# Patient Record
Sex: Female | Born: 1960
Health system: Southern US, Community
[De-identification: ages and names within clinical notes are randomized; demographics above are authoritative.]

## PROBLEM LIST (undated history)

## (undated) DIAGNOSIS — R55 Syncope and collapse: Secondary | ICD-10-CM

## (undated) DIAGNOSIS — D649 Anemia, unspecified: Secondary | ICD-10-CM

## (undated) DIAGNOSIS — F32A Depression, unspecified: Secondary | ICD-10-CM

## (undated) DIAGNOSIS — E785 Hyperlipidemia, unspecified: Secondary | ICD-10-CM

## (undated) DIAGNOSIS — F419 Anxiety disorder, unspecified: Secondary | ICD-10-CM

## (undated) DIAGNOSIS — I10 Essential (primary) hypertension: Secondary | ICD-10-CM

## (undated) DIAGNOSIS — E119 Type 2 diabetes mellitus without complications: Secondary | ICD-10-CM

## (undated) DIAGNOSIS — F319 Bipolar disorder, unspecified: Secondary | ICD-10-CM

## (undated) DIAGNOSIS — N189 Chronic kidney disease, unspecified: Secondary | ICD-10-CM

## (undated) DIAGNOSIS — K5792 Diverticulitis of intestine, part unspecified, without perforation or abscess without bleeding: Secondary | ICD-10-CM

## (undated) DIAGNOSIS — F329 Major depressive disorder, single episode, unspecified: Secondary | ICD-10-CM

## (undated) DIAGNOSIS — R739 Hyperglycemia, unspecified: Secondary | ICD-10-CM

## (undated) DIAGNOSIS — I214 Non-ST elevation (NSTEMI) myocardial infarction: Secondary | ICD-10-CM

## (undated) HISTORY — DX: Hyperglycemia, unspecified: R73.9

## (undated) HISTORY — DX: Depression, unspecified: F32.A

## (undated) HISTORY — DX: Major depressive disorder, single episode, unspecified: F32.9

## (undated) HISTORY — DX: Hyperlipidemia, unspecified: E78.5

## (undated) HISTORY — DX: Essential (primary) hypertension: I10

## (undated) HISTORY — DX: Type 2 diabetes mellitus without complications: E11.9

## (undated) HISTORY — DX: Chronic kidney disease, unspecified: N18.9

## (undated) HISTORY — DX: Anemia, unspecified: D64.9

## (undated) HISTORY — DX: Anxiety disorder, unspecified: F41.9

---
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
## 1981-02-12 HISTORY — PX: KNEE SURGERY: SHX244

## 1983-02-13 HISTORY — PX: CHOLECYSTECTOMY: SHX55

## 1983-02-13 HISTORY — PX: APPENDECTOMY: SHX54

## 1983-02-13 HISTORY — PX: TUBAL LIGATION: SHX77

## 1999-01-19 ENCOUNTER — Encounter: Payer: Self-pay | Admitting: Gastroenterology

## 1999-01-19 ENCOUNTER — Encounter: Admission: RE | Admit: 1999-01-19 | Discharge: 1999-01-19 | Payer: Self-pay | Admitting: Gastroenterology

## 1999-10-12 ENCOUNTER — Encounter: Payer: Self-pay | Admitting: *Deleted

## 1999-10-12 ENCOUNTER — Emergency Department (HOSPITAL_COMMUNITY): Admission: EM | Admit: 1999-10-12 | Discharge: 1999-10-12 | Payer: Self-pay | Admitting: Podiatry

## 2000-02-07 ENCOUNTER — Encounter: Admission: RE | Admit: 2000-02-07 | Discharge: 2000-05-07 | Payer: Self-pay | Admitting: Orthopaedic Surgery

## 2000-02-13 HISTORY — PX: HEMORRHOID SURGERY: SHX153

## 2000-06-20 ENCOUNTER — Encounter: Payer: Self-pay | Admitting: Internal Medicine

## 2000-06-20 ENCOUNTER — Encounter: Admission: RE | Admit: 2000-06-20 | Discharge: 2000-06-20 | Payer: Self-pay | Admitting: Internal Medicine

## 2000-09-24 ENCOUNTER — Other Ambulatory Visit: Admission: RE | Admit: 2000-09-24 | Discharge: 2000-09-24 | Payer: Self-pay | Admitting: Obstetrics and Gynecology

## 2000-12-26 ENCOUNTER — Encounter: Payer: Self-pay | Admitting: General Surgery

## 2000-12-26 ENCOUNTER — Ambulatory Visit (HOSPITAL_COMMUNITY): Admission: RE | Admit: 2000-12-26 | Discharge: 2000-12-26 | Payer: Self-pay | Admitting: General Surgery

## 2000-12-26 ENCOUNTER — Encounter (INDEPENDENT_AMBULATORY_CARE_PROVIDER_SITE_OTHER): Payer: Self-pay | Admitting: Specialist

## 2001-01-20 ENCOUNTER — Encounter (INDEPENDENT_AMBULATORY_CARE_PROVIDER_SITE_OTHER): Payer: Self-pay | Admitting: Specialist

## 2001-01-20 ENCOUNTER — Ambulatory Visit (HOSPITAL_COMMUNITY): Admission: RE | Admit: 2001-01-20 | Discharge: 2001-01-20 | Payer: Self-pay

## 2001-02-19 ENCOUNTER — Emergency Department (HOSPITAL_COMMUNITY): Admission: EM | Admit: 2001-02-19 | Discharge: 2001-02-19 | Payer: Self-pay | Admitting: Emergency Medicine

## 2001-02-19 ENCOUNTER — Encounter: Payer: Self-pay | Admitting: Emergency Medicine

## 2002-02-12 HISTORY — PX: TEAR DUCT PROBING: SHX793

## 2002-10-21 ENCOUNTER — Encounter: Payer: Self-pay | Admitting: Cardiology

## 2003-02-13 HISTORY — PX: DILATION AND CURETTAGE OF UTERUS: SHX78

## 2003-03-30 ENCOUNTER — Other Ambulatory Visit: Admission: RE | Admit: 2003-03-30 | Discharge: 2003-03-30 | Payer: Self-pay | Admitting: Obstetrics and Gynecology

## 2003-04-15 ENCOUNTER — Encounter: Admission: RE | Admit: 2003-04-15 | Discharge: 2003-04-15 | Payer: Self-pay | Admitting: Obstetrics and Gynecology

## 2003-05-28 ENCOUNTER — Encounter: Admission: RE | Admit: 2003-05-28 | Discharge: 2003-05-28 | Payer: Self-pay | Admitting: Obstetrics and Gynecology

## 2003-06-10 ENCOUNTER — Encounter: Admission: RE | Admit: 2003-06-10 | Discharge: 2003-06-10 | Payer: Self-pay | Admitting: Obstetrics and Gynecology

## 2003-09-06 ENCOUNTER — Encounter: Admission: RE | Admit: 2003-09-06 | Discharge: 2003-09-06 | Payer: Self-pay | Admitting: Obstetrics and Gynecology

## 2004-02-13 HISTORY — PX: POLYPECTOMY: SHX149

## 2004-04-19 ENCOUNTER — Emergency Department (HOSPITAL_COMMUNITY): Admission: EM | Admit: 2004-04-19 | Discharge: 2004-04-19 | Payer: Self-pay | Admitting: Family Medicine

## 2005-03-05 ENCOUNTER — Emergency Department (HOSPITAL_COMMUNITY): Admission: EM | Admit: 2005-03-05 | Discharge: 2005-03-05 | Payer: Self-pay | Admitting: Family Medicine

## 2005-03-29 ENCOUNTER — Encounter: Admission: RE | Admit: 2005-03-29 | Discharge: 2005-03-29 | Payer: Self-pay | Admitting: Orthopedic Surgery

## 2005-04-03 ENCOUNTER — Other Ambulatory Visit: Admission: RE | Admit: 2005-04-03 | Discharge: 2005-04-03 | Payer: Self-pay | Admitting: Obstetrics and Gynecology

## 2005-04-05 ENCOUNTER — Encounter: Admission: RE | Admit: 2005-04-05 | Discharge: 2005-04-05 | Payer: Self-pay | Admitting: Obstetrics and Gynecology

## 2006-05-28 ENCOUNTER — Encounter: Payer: Self-pay | Admitting: Cardiology

## 2006-08-03 ENCOUNTER — Emergency Department (HOSPITAL_COMMUNITY): Admission: EM | Admit: 2006-08-03 | Discharge: 2006-08-03 | Payer: Self-pay | Admitting: *Deleted

## 2006-08-27 ENCOUNTER — Ambulatory Visit (HOSPITAL_COMMUNITY): Admission: RE | Admit: 2006-08-27 | Discharge: 2006-08-27 | Payer: Self-pay | Admitting: Orthopaedic Surgery

## 2006-09-03 ENCOUNTER — Encounter: Admission: RE | Admit: 2006-09-03 | Discharge: 2006-12-02 | Payer: Self-pay | Admitting: Orthopaedic Surgery

## 2007-12-19 ENCOUNTER — Encounter: Payer: Self-pay | Admitting: Cardiology

## 2008-02-13 LAB — HM DEXA SCAN: HM Dexa Scan: NORMAL

## 2008-05-28 ENCOUNTER — Encounter: Payer: Self-pay | Admitting: Cardiology

## 2008-06-25 ENCOUNTER — Encounter: Admission: RE | Admit: 2008-06-25 | Discharge: 2008-06-25 | Payer: Self-pay | Admitting: Obstetrics and Gynecology

## 2008-07-10 ENCOUNTER — Emergency Department (HOSPITAL_COMMUNITY): Admission: EM | Admit: 2008-07-10 | Discharge: 2008-07-10 | Payer: Self-pay | Admitting: Family Medicine

## 2008-07-31 ENCOUNTER — Encounter: Payer: Self-pay | Admitting: Cardiology

## 2008-07-31 ENCOUNTER — Emergency Department (HOSPITAL_COMMUNITY): Admission: EM | Admit: 2008-07-31 | Discharge: 2008-07-31 | Payer: Self-pay | Admitting: Emergency Medicine

## 2008-08-19 DIAGNOSIS — F329 Major depressive disorder, single episode, unspecified: Secondary | ICD-10-CM

## 2008-08-19 DIAGNOSIS — F419 Anxiety disorder, unspecified: Secondary | ICD-10-CM

## 2008-08-19 DIAGNOSIS — F32A Depression, unspecified: Secondary | ICD-10-CM | POA: Insufficient documentation

## 2008-08-19 DIAGNOSIS — I10 Essential (primary) hypertension: Secondary | ICD-10-CM | POA: Insufficient documentation

## 2008-08-19 DIAGNOSIS — K589 Irritable bowel syndrome without diarrhea: Secondary | ICD-10-CM | POA: Insufficient documentation

## 2008-08-20 ENCOUNTER — Ambulatory Visit: Payer: Self-pay | Admitting: Cardiology

## 2008-08-20 DIAGNOSIS — R0609 Other forms of dyspnea: Secondary | ICD-10-CM | POA: Insufficient documentation

## 2008-08-20 DIAGNOSIS — R002 Palpitations: Secondary | ICD-10-CM | POA: Insufficient documentation

## 2008-08-20 DIAGNOSIS — R0600 Dyspnea, unspecified: Secondary | ICD-10-CM | POA: Insufficient documentation

## 2008-08-20 DIAGNOSIS — R0602 Shortness of breath: Secondary | ICD-10-CM

## 2008-08-20 LAB — CONVERTED CEMR LAB: Pro B Natriuretic peptide (BNP): 48 pg/mL (ref 0.0–100.0)

## 2008-08-26 ENCOUNTER — Encounter: Payer: Self-pay | Admitting: Cardiology

## 2008-08-29 LAB — CONVERTED CEMR LAB: TSH: 0.978 microintl units/mL (ref 0.350–4.500)

## 2008-10-11 ENCOUNTER — Ambulatory Visit: Payer: Self-pay

## 2008-10-11 ENCOUNTER — Ambulatory Visit: Payer: Self-pay | Admitting: Cardiology

## 2008-10-21 ENCOUNTER — Ambulatory Visit (HOSPITAL_COMMUNITY): Admission: RE | Admit: 2008-10-21 | Discharge: 2008-10-21 | Payer: Self-pay | Admitting: Cardiology

## 2008-10-26 ENCOUNTER — Encounter: Payer: Self-pay | Admitting: Cardiology

## 2008-10-26 ENCOUNTER — Ambulatory Visit (HOSPITAL_COMMUNITY): Admission: RE | Admit: 2008-10-26 | Discharge: 2008-10-26 | Payer: Self-pay | Admitting: Cardiology

## 2008-10-26 ENCOUNTER — Ambulatory Visit: Payer: Self-pay | Admitting: Internal Medicine

## 2008-11-01 ENCOUNTER — Telehealth: Payer: Self-pay | Admitting: Cardiology

## 2008-12-07 ENCOUNTER — Ambulatory Visit: Payer: Self-pay | Admitting: Cardiology

## 2008-12-07 DIAGNOSIS — Z720 Tobacco use: Secondary | ICD-10-CM | POA: Insufficient documentation

## 2008-12-07 DIAGNOSIS — F17200 Nicotine dependence, unspecified, uncomplicated: Secondary | ICD-10-CM | POA: Insufficient documentation

## 2008-12-13 LAB — HM PAP SMEAR: HM Pap smear: NORMAL

## 2008-12-13 LAB — HM MAMMOGRAPHY: HM Mammogram: NORMAL

## 2010-02-17 ENCOUNTER — Emergency Department (HOSPITAL_COMMUNITY)
Admission: EM | Admit: 2010-02-17 | Discharge: 2010-02-17 | Payer: Self-pay | Source: Home / Self Care | Admitting: Emergency Medicine

## 2010-02-17 LAB — POCT CARDIAC MARKERS
CKMB, poc: 1.4 ng/mL (ref 1.0–8.0)
Myoglobin, poc: 105 ng/mL (ref 12–200)
Troponin i, poc: <0.05 ng/mL (ref 0.00–0.09)

## 2010-02-17 LAB — CBC
HCT: 42.6 % (ref 36.0–46.0)
Hemoglobin: 14.5 g/dL (ref 12.0–15.0)
MCH: 31.6 pg (ref 26.0–34.0)
MCHC: 34 g/dL (ref 30.0–36.0)
MCV: 92.8 fL (ref 78.0–100.0)
Platelets: 298 10*3/uL (ref 150–400)
RBC: 4.59 MIL/uL (ref 3.87–5.11)
RDW: 13.8 % (ref 11.5–15.5)
WBC: 9 10*3/uL (ref 4.0–10.5)

## 2010-02-17 LAB — BASIC METABOLIC PANEL
BUN: 8 mg/dL (ref 6–23)
CO2: 27 mEq/L (ref 19–32)
Calcium: 10 mg/dL (ref 8.4–10.5)
Chloride: 104 mEq/L (ref 96–112)
Creatinine, Ser: 1.02 mg/dL (ref 0.4–1.2)
GFR calc Af Amer: >60 mL/min (ref >60)
GFR calc non Af Amer: 57 mL/min — ABNORMAL LOW (ref >60)
Glucose, Bld: 93 mg/dL (ref 70–99)
Potassium: 3.4 mEq/L — ABNORMAL LOW (ref 3.5–5.1)
Sodium: 141 mEq/L (ref 135–145)

## 2010-02-17 LAB — DIFFERENTIAL
Basophils Absolute: 0 10*3/uL (ref 0.0–0.1)
Basophils Relative: 0 % (ref 0–1)
Eosinophils Absolute: 0.1 10*3/uL (ref 0.0–0.7)
Eosinophils Relative: 1 % (ref 0–5)
Lymphocytes Relative: 44 % (ref 12–46)
Lymphs Abs: 4 10*3/uL (ref 0.7–4.0)
Monocytes Absolute: 0.7 10*3/uL (ref 0.1–1.0)
Monocytes Relative: 8 % (ref 3–12)
Neutro Abs: 4.2 10*3/uL (ref 1.7–7.7)
Neutrophils Relative %: 47 % (ref 43–77)

## 2010-02-19 ENCOUNTER — Emergency Department (HOSPITAL_COMMUNITY)
Admission: EM | Admit: 2010-02-19 | Discharge: 2010-02-19 | Payer: Self-pay | Source: Home / Self Care | Admitting: Emergency Medicine

## 2010-02-27 LAB — POCT I-STAT, CHEM 8
BUN: 9 mg/dL (ref 6–23)
Calcium, Ion: 1.21 mmol/L (ref 1.12–1.32)
Chloride: 103 mEq/L (ref 96–112)
Creatinine, Ser: 1 mg/dL (ref 0.4–1.2)
Glucose, Bld: 106 mg/dL — ABNORMAL HIGH (ref 70–99)
HCT: 41 % (ref 36.0–46.0)
Hemoglobin: 13.9 g/dL (ref 12.0–15.0)
Potassium: 3.4 mEq/L — ABNORMAL LOW (ref 3.5–5.1)
Sodium: 141 mEq/L (ref 135–145)
TCO2: 29 mmol/L (ref 0–100)

## 2010-02-27 LAB — CSF CELL COUNT WITH DIFFERENTIAL
RBC Count, CSF: 0 /mm3
RBC Count, CSF: 0 /mm3
Tube #: 1
Tube #: 4
WBC, CSF: 0 /mm3 (ref 0–5)
WBC, CSF: 0 /mm3 (ref 0–5)

## 2010-02-27 LAB — PROTEIN AND GLUCOSE, CSF
Glucose, CSF: 65 mg/dL (ref 43–76)
Total  Protein, CSF: 32 mg/dL (ref 15–45)

## 2010-02-27 LAB — CSF CULTURE W GRAM STAIN
Culture: NO GROWTH
Gram Stain: NONE SEEN

## 2010-05-22 LAB — POCT CARDIAC MARKERS: Myoglobin, poc: 75.4 ng/mL (ref 12–200)

## 2010-05-22 LAB — URINALYSIS, ROUTINE W REFLEX MICROSCOPIC
Glucose, UA: NEGATIVE mg/dL
Hgb urine dipstick: NEGATIVE
Ketones, ur: NEGATIVE mg/dL
pH: 6 (ref 5.0–8.0)

## 2010-05-22 LAB — D-DIMER, QUANTITATIVE: D-Dimer, Quant: 0.35 ug/mL-FEU (ref 0.00–0.48)

## 2010-05-22 LAB — POCT I-STAT, CHEM 8
Creatinine, Ser: 0.9 mg/dL (ref 0.4–1.2)
HCT: 35 % — ABNORMAL LOW (ref 36.0–46.0)
Hemoglobin: 11.9 g/dL — ABNORMAL LOW (ref 12.0–15.0)
Potassium: 3.3 mEq/L — ABNORMAL LOW (ref 3.5–5.1)
Sodium: 139 mEq/L (ref 135–145)
TCO2: 27 mmol/L (ref 0–100)

## 2010-06-30 NOTE — Op Note (Signed)
[facility]  Patient:    Kaitlyn Harper, Kaitlyn Harper Visit Number: [id number] MRN: [id number]          Service Type: DSU Location: DAY Attending Physician:  Carson Myrtle Dictated by:   Sheppard Plumber Earlene Plater, M.D. Proc. Date: 12/26/00 Admit Date:  12/26/2000                             Operative Report  OUTPATIENT  PREOPERATIVE DIAGNOSIS:  Internal and external hemorrhoids.  Rule out ______ abscess.  POSTOPERATIVE DIAGNOSIS: Internal and external hemorrhoids.  Rule out ______ abscess.  OPERATION/PROCEDURE:  Examination under anesthesia.  Removal of hemorrhoids.  ANESTHESIA:  General  SURGEON: Timothy E. Earlene Plater, M.D.  HISTORY:  This patient is 30 and otherwise healthy, has been followed 3 weeks in the office beginning with intense anorectal pain, questionable drainage. She was seen in follow up conservatively, although she is better, it is not significantly healed, and she presents now for examination under anesthesia, removal of hemorrhoid and possible drainage of abscess.  DESCRIPTION OF PROCEDURE:  The patient was taken to the operating room, placed supine and general mask anesthesia provided, placed in the lithotomy position. Perianal area inspected with magnification.  A significant large, right anterior tag was present.  This was connected to a small, but significant internal hemorrhoid.  I carefully examined the entire rectal mucosa, anoderm and perianal skin, felt on palpation and visualization I could not see any evidence of abscess, fistula, or fissure.  The sphincters were intact.  The orifice was adequate.  I simply removed the single hemorrhoid in the right anterior position.  It was closed with a running 3-0 chromic.  There were no bleeding or complications.  The sphincter is intact.  She tolerated well.  She was removed to recovery room in good condition.  Written and verbal instructions were given including Percocet  for pain. Dictated by:   Sheppard Plumber Earlene Plater, M.D. Attending Physician:  Carson Myrtle DD:  12/26/00 TD:  12/26/00 Job: [id number]

## 2010-06-30 NOTE — H&P (Signed)
[facility]  Patient:    Kaitlyn Harper, Kaitlyn Harper Visit Number: [id number] MRN: [id number]          Service Type: Attending:  Guy Sandifer. Arleta Creek, M.D. Dictated by:   Guy Sandifer Arleta Creek, M.D. Adm. Date:  01/20/01                           History and Physical  CHIEF COMPLAINT:              Menometrorrhagia.  HISTORY OF PRESENT ILLNESS:   This patient is a 50 year old divorced black female, G3, P3, status post tubal ligation, whose cycles have been very predictable.  However, over the past year, she has a menstrual cycle every 18 to 20 days.  She has one day of heavy bleeding, changing a pad every two hours.  On examination, uterus was upper limits of normal size.  On ultrasound, the uterus measures 11.0 x 6.2 x 5.1 cm.  There are at least two leiomyomata noted measuring 1.3 and 1.5 cm, respectively, in the right lateral wall of the uterus.  The ovaries are normal.  Sonohysterogram reveals a 7 x 7-mm area of thickening on the posterior wall consistent with a possible submucous myoma.  Hysteroscopy with D&C and resectoscope has been discussed with the patient.  She is being admitted for this procedure.  PAST MEDICAL HISTORY:         1. Recent perirectal abscess and hemorrhoids                                  treated by Dr. Earlene Plater.                               2. Chronic hypertension.                               3. Irritable bowel syndrome.                               4. Depression in 1995.  PAST SURGICAL HISTORY:        1. Hemorrhoidectomy, November of this year,                                  Dr. Earlene Plater.                               2. Cholecystectomy and appendectomy.                               3. Spur removed from right patella.                               4. Postpartum tubal ligation.  OBSTETRICAL HISTORY:          Vaginal deliveries x 3.  FAMILY HISTORY:               Chronic hypertension and CVA in maternal grandmother, chronic  hypertension in father, hemophilia in first cousin,  scoliosis in daughter.  SOCIAL HISTORY:               Patient denies tobacco, alcohol or drug abuse.  MEDICATIONS:                  1. Benicar 40 mg daily.                               2. Prozac 20 mg daily.                               3. Hydrochlorothiazide 25 mg daily.                               4. Stool softener daily.                               5. Mircette daily.  ALLERGIES:                    SULFA leading to a rash.  REVIEW OF SYSTEMS:            Negative except as above.  PHYSICAL EXAMINATION:  VITAL SIGNS:                  Height 5 feet 3 inches.  Weight 155 pounds. Blood pressure 106/66.  HEENT:                        Without thyromegaly.  LUNGS:                        Clear to auscultation.  HEART:                        Regular rate and rhythm.  BACK:                         Without CVA tenderness.  BREASTS:                      Without masses, retraction or discharge.  ABDOMEN:                      Soft and nontender without masses.  PELVIC:                       Vulva, vagina and cervix without lesion.  Uterus anteverted, upper limits of normal size, nontender.  Adnexa nontender without masses.  EXTREMITIES:                  Grossly within normal limits.  NEUROLOGIC:                   Grossly within normal limits.  ASSESSMENT:                   Menometrorrhagia.  PLAN:                         Hysteroscopy, D&C and resectoscope. Dictated by:   Guy Sandifer Arleta Creek, M.D. Attending:  Guy Sandifer Arleta Creek, M.D. DD:  01/17/01 TD:  01/17/01 Job: [id number]

## 2010-06-30 NOTE — Op Note (Signed)
[facility]  Patient:    Kaitlyn Harper, Kaitlyn Harper Visit Number: [id number] MRN: [id number]          Service Type: DSU Location: [facility] Attending Physician:  Soledad Gerlach Dictated by:   Guy Sandifer Arleta Creek, M.D. Proc. Date: 01/20/01 Admit Date:  01/20/2001                             Operative Report  PREOPERATIVE DIAGNOSIS:       Menometrorrhagia.  POSTOPERATIVE DIAGNOSIS:      Menometrorrhagia.  OPERATION/PROCEDURE:          Hysteroscopy with resectoscope dilatation and curettage.  SURGEON:                      Guy Sandifer. Arleta Creek, M.D.  ANESTHESIA:                   General with LMA, 1% inferior cervical block.  ESTIMATED BLOOD LOSS:         Less than 50 cc.  INTAKE & OUTPUT:               Sorbitol distending media, 140 cc deficit.  INDICATIONS/CONSENT:          This patient is a 50 year old black female, with menometrorrhagia.  Details have been dictated in the History and Physical. Hysteroscopy with resectoscope, D&C were discussed with the patient. Potential risks and complications have been discussed including, but not limited to, infection, bowel/bladder or ureteral damage, bleeding requiring transfusion of blood products with possible transfusion reaction, HIV and hepatitis acquisition, DVT, PE, pneumonia, hysterectomy, and laparotomy.  All questions have been answered and consent is signed and on the chart.  FINDINGS:                     Initially the endometrial canal is uniform without abnormal structures of vessels.  Following D&C reinspection with the hysteroscope reveals an area on the mid posterior uterine wall that appears to be elevated, possibly consistent with a small submucous leiomyoma.  Fallopian tube ostia are identified bilaterally.  DESCRIPTION OF PROCEDURE:     The patient was taken to the operating room and placed in the dorsal supine position.  General anesthesia via LMA was induced. She was then placed in  the dorsal lithotomy position and gently prepped.  The bladder was straight catheterized and she was draped in sterile fashion.  A bivalve speculum was placed in the vagina and the anterior cervical lip injected with 1% xylocaine and grasped with a single-tooth tenaculum.  A paracervical block was then placed at the two, four, five, seven, eight, and ten oclock positions with 1% xylocaine.  The cervix was then gently progressively dilated to a 33 Pratt dilator.  The resectoscope was then placed in the cervix containing the single right angle wire loop and advanced under direct visualization using Sorbitol distending media.  The above findings were noted.  The hysteroscope was withdrawn and sharp curettage was carried out. The hysteroscope was then reintroduced and the above findings again noted. This area in the mid posterior uterine wall was resected in simple fashion. At the end of thee resection good hemostasis was noted and the cavity was clean and uniform.  All instruments counts were correct.  The patient was awakened and taken to the recovery room in stable condition. Dictated by:   Guy Sandifer Arleta Creek, M.D. Attending  Physician:  Soledad Gerlach DD:  01/20/01 TD:  01/20/01 Job: [id number]

## 2010-11-29 LAB — I-STAT 8, (EC8 V) (CONVERTED LAB)
BUN: 6
Bicarbonate: 25.6 — ABNORMAL HIGH
Glucose, Bld: 92
HCT: 37
Operator id: 257131
TCO2: 27
pCO2, Ven: 37 — ABNORMAL LOW

## 2010-11-29 LAB — D-DIMER, QUANTITATIVE: D-Dimer, Quant: 0.27

## 2010-11-29 LAB — POCT I-STAT CREATININE: Operator id: 257131

## 2010-11-29 LAB — POCT CARDIAC MARKERS
CKMB, poc: <1 — ABNORMAL LOW
Troponin i, poc: <0.05

## 2010-12-26 ENCOUNTER — Ambulatory Visit (HOSPITAL_BASED_OUTPATIENT_CLINIC_OR_DEPARTMENT_OTHER)
Admission: RE | Admit: 2010-12-26 | Discharge: 2010-12-26 | Disposition: A | Discharge status: Home / Self Care | Payer: 59 | Source: Ambulatory Visit | Attending: Family | Admitting: Family

## 2010-12-26 ENCOUNTER — Ambulatory Visit (INDEPENDENT_AMBULATORY_CARE_PROVIDER_SITE_OTHER): Payer: 59 | Admitting: Family

## 2010-12-26 ENCOUNTER — Other Ambulatory Visit: Payer: Self-pay | Admitting: Family

## 2010-12-26 ENCOUNTER — Encounter: Payer: Self-pay | Admitting: Family

## 2010-12-26 DIAGNOSIS — E559 Vitamin D deficiency, unspecified: Secondary | ICD-10-CM | POA: Insufficient documentation

## 2010-12-26 DIAGNOSIS — F341 Dysthymic disorder: Secondary | ICD-10-CM

## 2010-12-26 DIAGNOSIS — Z1231 Encounter for screening mammogram for malignant neoplasm of breast: Secondary | ICD-10-CM

## 2010-12-26 DIAGNOSIS — R7309 Other abnormal glucose: Secondary | ICD-10-CM

## 2010-12-26 DIAGNOSIS — E1122 Type 2 diabetes mellitus with diabetic chronic kidney disease: Secondary | ICD-10-CM | POA: Insufficient documentation

## 2010-12-26 DIAGNOSIS — R635 Abnormal weight gain: Secondary | ICD-10-CM

## 2010-12-26 DIAGNOSIS — E119 Type 2 diabetes mellitus without complications: Secondary | ICD-10-CM | POA: Insufficient documentation

## 2010-12-26 DIAGNOSIS — E785 Hyperlipidemia, unspecified: Secondary | ICD-10-CM | POA: Insufficient documentation

## 2010-12-26 DIAGNOSIS — K589 Irritable bowel syndrome without diarrhea: Secondary | ICD-10-CM

## 2010-12-26 DIAGNOSIS — D649 Anemia, unspecified: Secondary | ICD-10-CM

## 2010-12-26 DIAGNOSIS — I10 Essential (primary) hypertension: Secondary | ICD-10-CM

## 2010-12-26 DIAGNOSIS — F32A Depression, unspecified: Secondary | ICD-10-CM

## 2010-12-26 DIAGNOSIS — F329 Major depressive disorder, single episode, unspecified: Secondary | ICD-10-CM

## 2010-12-26 DIAGNOSIS — F17200 Nicotine dependence, unspecified, uncomplicated: Secondary | ICD-10-CM

## 2010-12-26 DIAGNOSIS — R739 Hyperglycemia, unspecified: Secondary | ICD-10-CM

## 2010-12-26 LAB — CBC WITH DIFFERENTIAL/PLATELET
Eosinophils Absolute: 0.1 10*3/uL (ref 0.0–0.7)
Eosinophils Relative: 1 % (ref 0–5)
HCT: 37.8 % (ref 36.0–46.0)
Hemoglobin: 12.5 g/dL (ref 12.0–15.0)
Lymphs Abs: 3.4 10*3/uL (ref 0.7–4.0)
MCH: 31.1 pg (ref 26.0–34.0)
MCV: 94 fL (ref 78.0–100.0)
Monocytes Relative: 8 % (ref 3–12)
Neutrophils Relative %: 50 % (ref 43–77)
RBC: 4.02 MIL/uL (ref 3.87–5.11)

## 2010-12-26 LAB — HEPATIC FUNCTION PANEL
AST: 22 U/L (ref 0–37)
Alkaline Phosphatase: 84 U/L (ref 39–117)
Bilirubin, Direct: 0.1 mg/dL (ref 0.0–0.3)
Indirect Bilirubin: 0.3 mg/dL (ref 0.0–0.9)
Total Bilirubin: 0.4 mg/dL (ref 0.3–1.2)

## 2010-12-26 LAB — BASIC METABOLIC PANEL
CO2: 31 mEq/L (ref 19–32)
Calcium: 9.9 mg/dL (ref 8.4–10.5)
Glucose, Bld: 88 mg/dL (ref 70–99)
Sodium: 140 mEq/L (ref 135–145)

## 2010-12-26 MED ORDER — NEBIVOLOL HCL 10 MG PO TABS: 10.0000 mg | ORAL_TABLET | Freq: Every day | ORAL | Status: DC

## 2010-12-26 MED ORDER — DULOXETINE HCL 60 MG PO CPEP: 60.0000 mg | ORAL_CAPSULE | Freq: Every day | ORAL | Status: DC

## 2010-12-26 MED ORDER — AMLODIPINE-VALSARTAN-HCTZ 5-160-12.5 MG PO TABS: 1.0000 | ORAL_TABLET | Freq: Every day | ORAL | Status: DC

## 2010-12-26 NOTE — ED Provider Notes (Signed)
Order(s) created erroneously. Erroneous order ID: [id number] Order moved by: DORR, SUSAN Order move date/time: 12/26/2010  4:37 PM Source Patient:    [id number] Source Contact: 12/26/2010 Destination Patient:    [id number] Destination Contact: 12/06/2010

## 2010-12-26 NOTE — Assessment & Plan Note (Signed)
Likely due to lexapro.  See discussion re: depression/anxiety.  We discussed dietary changes and exercise today.

## 2010-12-26 NOTE — Assessment & Plan Note (Signed)
Check Vitamin D level 

## 2010-12-26 NOTE — Assessment & Plan Note (Signed)
Pt was counseled on the importance of tobacco cessation.  I recommended that she set a quit date for after christmas and try the patch.

## 2010-12-26 NOTE — Assessment & Plan Note (Signed)
BP is elevated today.  She is on phentermine.  I have advised her to discontinue use of phentermine.  We will plan to repeat her blood pressure in 1 month off of the phentermine. Hopefully it will be improved at that time. Continue bystolic and exforge.

## 2010-12-26 NOTE — Progress Notes (Signed)
EKG was not performed on pt today. Charge has been removed. EKG associated with visit today belongs to another patient and was entered into this chart in error.  Mervin Kung, CMA (AAMA)

## 2010-12-26 NOTE — Assessment & Plan Note (Signed)
Check A1C 

## 2010-12-26 NOTE — Assessment & Plan Note (Signed)
50 yr old female with anxiety and depression.  She requests a referral to a therapist. Will arrange.  Due to weight gain on lexapro (30 pounds) will try Cymbalta which is weight neutral instead.

## 2010-12-26 NOTE — Patient Instructions (Signed)
Please complete lab work prior to leaving. You will be contacted about your referral to the therapist. Schedule a physical in 1 month.

## 2010-12-26 NOTE — Progress Notes (Signed)
Subjective:    Patient ID: Kaitlyn Harper, female    DOB: Jan 26, 1961, 50 y.o.   MRN: [id number]  HPI  Ms.  Dejarnett is a 50 yr old female who presents today to establish care. Pt has followed most recently with Dr.  Arvella Nigh.    HTN-  She was diagnosed about 4 yrs ago.  Currently being treated with exforge HCT.    Overweight-  She has only been on phentermine x 1 month. She has not lost any weight.  She reports that she has had weight gain but thyroid testing was reportedly normal. She has gained 30 pounds since the first of the year which was when she started lexapro.    Depression-  Her father killed her mother on christmas- pt was age 87 when this happened.  She has been going to see a therapist.  She sleeps well. Denies tearfulness.  She enjoys reading sewing, travel.  She would like to see a therapist.  She has been on lexapro since the first of the year.    IBS- she reports intermitting flare ups diarrhea alternating with constipation.  Vitamin D Deficiency- she has been on the weekly dosing for 2 yrs.  Tobacco abuse- she has smoked since age 42.  She smokes 1/2 PPD.   She has tried to quit with chantix x 2 without success, but she was not ready.  She has not tried the patch.      Review of Systems  Constitutional: Positive for unexpected weight change.  HENT: Negative for hearing loss and congestion.   Eyes: Negative for visual disturbance.  Cardiovascular: Negative for chest pain, palpitations and leg swelling.  Gastrointestinal: Negative for nausea, vomiting and diarrhea.  Genitourinary: Negative for dysuria and frequency.       LMP was at age 58  Musculoskeletal: Negative for myalgias and arthralgias.  Neurological: Negative for headaches.  Hematological: Negative for adenopathy.  Psychiatric/Behavioral:       Reports that her anxiety is controlled with medication       Past Medical History  Diagnosis Date  . Hypertension   . Hyperlipidemia   . Hyperglycemia   .  Anemia   . Anxiety   . Depression     History   Social History  . Marital Status: Divorced    Spouse Name: N/A    Number of Children: 3  . Years of Education: N/A   Occupational History  . RN SECRETARY    Social History Main Topics  . Smoking status: Current Everyday Smoker    Types: Cigarettes  . Smokeless tobacco: Never Used   Comment: 10 cigarettes daily  . Alcohol Use: No  . Drug Use: No  . Sexually Active: Not on file   Other Topics Concern  . Not on file   Social History Narrative   Caffeine use:  2 drinksRegular exercise: no    Past Surgical History  Procedure Date  . Cholecystectomy 1985  . Appendectomy 1985  . Tubal ligation 1985  . Knee surgery 1983    arthroscopy?  . Dilation and curettage of uterus 2005  . Polypectomy 2006    ?anal polyp?    Family History  Problem Relation Age of Onset  . Hypertension Father   . Sarcoidosis Brother   . Stroke Maternal Grandmother   . Alcohol abuse Brother   . Cirrhosis Brother     Allergies  Allergen Reactions  . Sulfonamide Derivatives     No current outpatient prescriptions on  file prior to visit.    BP 152/90  Pulse 66  Temp(Src) 98 F (36.7 C) (Oral)  Resp 16  Ht 5\' 4"  (1.626 m)  Wt 182 lb 0.6 oz (82.573 kg)  BMI 31.25 kg/m2  LMP 12/25/2008    Objective:   Physical Exam  Constitutional: She is oriented to person, place, and time. She appears well-developed and well-nourished. No distress.  HENT:  Head: Normocephalic and atraumatic.  Mouth/Throat: No oropharyngeal exudate.  Eyes: Conjunctivae are normal. Pupils are equal, round, and reactive to light. No scleral icterus.  Neck: Normal range of motion. Neck supple. No thyromegaly present.  Cardiovascular: Normal rate and regular rhythm.   No murmur heard. Pulmonary/Chest: Effort normal and breath sounds normal. No respiratory distress. She has no wheezes. She has no rales.  Abdominal: Soft.  Neurological: She is alert and oriented to  person, place, and time.  Skin: Skin is warm and dry. No erythema.  Psychiatric: She has a normal mood and affect. Her behavior is normal. Judgment and thought content normal.          Assessment & Plan:  Samples provided for cymbalta 60mg   03 2014 [id number] a

## 2010-12-26 NOTE — Assessment & Plan Note (Signed)
On crestor. Will check FLP and LFT's today.

## 2010-12-26 NOTE — Assessment & Plan Note (Signed)
Stable at present.  Monitor.

## 2010-12-27 ENCOUNTER — Encounter: Payer: Self-pay | Admitting: Family

## 2010-12-27 LAB — VITAMIN D 25 HYDROXY (VIT D DEFICIENCY, FRACTURES): Vit D, 25-Hydroxy: 56 ng/mL (ref 30–89)

## 2010-12-28 ENCOUNTER — Other Ambulatory Visit: Payer: 59

## 2010-12-29 ENCOUNTER — Inpatient Hospital Stay: Admission: RE | Admit: 2010-12-29 | Payer: 59 | Source: Ambulatory Visit

## 2010-12-29 ENCOUNTER — Telehealth: Payer: Self-pay | Admitting: *Deleted

## 2010-12-29 NOTE — Telephone Encounter (Signed)
Received message from [facility] radiology requesting order be placed for pt's DEXA that is scheduled for this afternoon.

## 2011-01-01 ENCOUNTER — Ambulatory Visit (INDEPENDENT_AMBULATORY_CARE_PROVIDER_SITE_OTHER)
Admission: RE | Admit: 2011-01-01 | Discharge: 2011-01-01 | Disposition: A | Discharge status: Home / Self Care | Payer: 59 | Source: Ambulatory Visit

## 2011-01-01 DIAGNOSIS — M899 Disorder of bone, unspecified: Secondary | ICD-10-CM

## 2011-01-01 DIAGNOSIS — M949 Disorder of cartilage, unspecified: Secondary | ICD-10-CM

## 2011-01-01 LAB — HM DEXA SCAN: HM Dexa Scan: NORMAL

## 2011-01-05 ENCOUNTER — Other Ambulatory Visit: Payer: Self-pay | Admitting: Obstetrics and Gynecology

## 2011-01-05 DIAGNOSIS — R928 Other abnormal and inconclusive findings on diagnostic imaging of breast: Secondary | ICD-10-CM

## 2011-01-08 ENCOUNTER — Ambulatory Visit (INDEPENDENT_AMBULATORY_CARE_PROVIDER_SITE_OTHER): Payer: 59 | Admitting: Licensed Clinical Social Worker

## 2011-01-08 DIAGNOSIS — F331 Major depressive disorder, recurrent, moderate: Secondary | ICD-10-CM

## 2011-01-08 DIAGNOSIS — F411 Generalized anxiety disorder: Secondary | ICD-10-CM

## 2011-01-18 ENCOUNTER — Ambulatory Visit
Admission: RE | Admit: 2011-01-18 | Discharge: 2011-01-18 | Disposition: A | Discharge status: Home / Self Care | Payer: 59 | Source: Ambulatory Visit | Attending: Obstetrics and Gynecology | Admitting: Obstetrics and Gynecology

## 2011-01-18 DIAGNOSIS — R928 Other abnormal and inconclusive findings on diagnostic imaging of breast: Secondary | ICD-10-CM

## 2011-01-19 ENCOUNTER — Ambulatory Visit (INDEPENDENT_AMBULATORY_CARE_PROVIDER_SITE_OTHER): Payer: 59 | Admitting: Licensed Clinical Social Worker

## 2011-01-19 ENCOUNTER — Ambulatory Visit
Admission: RE | Admit: 2011-01-19 | Discharge: 2011-01-19 | Disposition: A | Discharge status: Home / Self Care | Payer: 59 | Source: Ambulatory Visit | Attending: Obstetrics and Gynecology | Admitting: Obstetrics and Gynecology

## 2011-01-19 DIAGNOSIS — F331 Major depressive disorder, recurrent, moderate: Secondary | ICD-10-CM

## 2011-01-19 DIAGNOSIS — F411 Generalized anxiety disorder: Secondary | ICD-10-CM

## 2011-01-24 ENCOUNTER — Encounter: Payer: Self-pay | Admitting: Family

## 2011-01-26 ENCOUNTER — Encounter: Payer: Self-pay | Admitting: Gastroenterology

## 2011-01-26 ENCOUNTER — Encounter: Payer: Self-pay | Admitting: Family

## 2011-01-26 ENCOUNTER — Ambulatory Visit (INDEPENDENT_AMBULATORY_CARE_PROVIDER_SITE_OTHER): Payer: 59 | Admitting: Family

## 2011-01-26 VITALS — BP 92/76 | HR 72 | Temp 98.1°F | Resp 16 | Ht 64.0 in | Wt 176.1 lb

## 2011-01-26 DIAGNOSIS — R232 Flushing: Secondary | ICD-10-CM

## 2011-01-26 DIAGNOSIS — F32A Depression, unspecified: Secondary | ICD-10-CM

## 2011-01-26 DIAGNOSIS — N951 Menopausal and female climacteric states: Secondary | ICD-10-CM

## 2011-01-26 DIAGNOSIS — Z0000 Encounter for general adult medical examination without abnormal findings: Secondary | ICD-10-CM

## 2011-01-26 DIAGNOSIS — F329 Major depressive disorder, single episode, unspecified: Secondary | ICD-10-CM

## 2011-01-26 DIAGNOSIS — F341 Dysthymic disorder: Secondary | ICD-10-CM

## 2011-01-26 MED ORDER — DULOXETINE HCL 60 MG PO CPEP: 60.0000 mg | ORAL_CAPSULE | Freq: Every day | ORAL | Status: DC

## 2011-01-26 NOTE — Progress Notes (Signed)
Subjective:    Patient ID: Kaitlyn Harper, female    DOB: 1960-07-04, 50 y.o.   MRN: [id number]  HPI  Kaitlyn Harper is a 50 yr old female who presents today for her complete physical.  She is doing some walking about 1 hour 3 days a week.  She reports that she has been trying to eat healthy, avoid sugar. Her tetanus and flu shots are up to date.    Hot Flashes- She would like referral to GYN to discuss hot flashes.  She is also due for her pap.  Her last period was in 2010.  Depression- improved on cymbalta.  She is also seeing a therapist which is helping her.    Review of Systems  Constitutional: Negative for unexpected weight change.  HENT: Negative for hearing loss.   Eyes: Negative for visual disturbance.  Respiratory: Negative for chest tightness and shortness of breath.   Cardiovascular: Negative for chest pain and leg swelling.  Gastrointestinal: Negative for nausea, vomiting and diarrhea.  Genitourinary: Negative for dysuria, urgency and frequency.  Musculoskeletal: Negative for myalgias and arthralgias.  Skin: Negative for rash.  Neurological: Negative for headaches.  Hematological: Negative for adenopathy.  Psychiatric/Behavioral:       She is seeing susan wood for counselling.  She is continuing of cymbalata.  Feels that this is really helping better.   Past Medical History  Diagnosis Date  . Hypertension   . Hyperlipidemia   . Hyperglycemia   . Anemia   . Anxiety   . Depression     History   Social History  . Marital Status: Divorced    Spouse Name: N/A    Number of Children: 3  . Years of Education: N/A   Occupational History  . RN SECRETARY    Social History Main Topics  . Smoking status: Current Everyday Smoker    Types: Cigarettes  . Smokeless tobacco: Never Used   Comment: 10 cigarettes daily  . Alcohol Use: No  . Drug Use: No  . Sexually Active: Not on file   Other Topics Concern  . Not on file   Social History Narrative   Caffeine use:   2 drinksRegular exercise: no    Past Surgical History  Procedure Date  . Cholecystectomy 1985  . Appendectomy 1985  . Tubal ligation 1985  . Knee surgery 1983    arthroscopy?  . Dilation and curettage of uterus 2005  . Polypectomy 2006    ?anal polyp?    Family History  Problem Relation Age of Onset  . Hypertension Father   . Sarcoidosis Brother   . Stroke Maternal Grandmother   . Alcohol abuse Brother   . Cirrhosis Brother     Allergies  Allergen Reactions  . Sulfonamide Derivatives     Current Outpatient Prescriptions on File Prior to Visit  Medication Sig Dispense Refill  . Amlodipine-Valsartan-HCTZ (EXFORGE HCT) 5-160-12.5 MG TABS Take 1 tablet by mouth daily.  30 tablet  2  . Biotin 5000 MCG TABS Take 1 tablet by mouth daily.        . nebivolol (BYSTOLIC) 10 MG tablet Take 1 tablet (10 mg total) by mouth daily.  30 tablet  3  . rosuvastatin (CRESTOR) 10 MG tablet Take 10 mg by mouth daily.        . Vitamin D, Ergocalciferol, (DRISDOL) 50000 UNITS CAPS Take 50,000 Units by mouth every 7 (seven) days.          BP 92/76  Pulse 72  Temp(Src) 98.1 F (36.7 C) (Oral)  Resp 16  Ht 5\' 4"  (1.626 m)  Wt 176 lb 1.3 oz (79.869 kg)  BMI 30.22 kg/m2  LMP 12/25/2008        Objective:   Physical Exam  Constitutional: She is oriented to person, place, and time. She appears well-developed and well-nourished. No distress.  HENT:  Head: Normocephalic and atraumatic.  Right Ear: Tympanic membrane and ear canal normal.  Left Ear: Tympanic membrane and ear canal normal.  Mouth/Throat: No oropharyngeal exudate or posterior oropharyngeal edema.  Neck: Normal range of motion. Neck supple.  Cardiovascular: Normal rate and regular rhythm.   No murmur heard. Pulmonary/Chest: Effort normal and breath sounds normal. No respiratory distress. She has no wheezes. She has no rales. She exhibits no tenderness.  Abdominal: Soft. Bowel sounds are normal. She exhibits no distension and  no mass. There is no tenderness. There is no rebound and no guarding.  Genitourinary:       Breast/pelvic exam deferred to GYN.   Musculoskeletal: She exhibits no edema.  Lymphadenopathy:    She has no cervical adenopathy.  Neurological: She is alert and oriented to person, place, and time.  Skin: Skin is warm and dry. No rash noted. No erythema. No pallor.  Psychiatric: She has a normal mood and affect. Her behavior is normal. Judgment and thought content normal.          Assessment & Plan:

## 2011-01-26 NOTE — Patient Instructions (Addendum)
Please follow up in 4 months, sooner if problems or concerns. Continue your work with your therapist.  Kaitlyn Harper will be contacted about your referral to GYN and GI for colonoscopy.  Continue your weight loss, dietary changes and exercise.   Start the Nicotine patch January 1st: 21 mg patch daily for 6 weeks, then   Dose: apply 21 mg patch once daily for 6 weeks, then 14mg  patch for 2 weeks then 7mg  patch for 2 weeks then stop.

## 2011-01-29 DIAGNOSIS — N951 Menopausal and female climacteric states: Secondary | ICD-10-CM | POA: Insufficient documentation

## 2011-01-29 DIAGNOSIS — Z0000 Encounter for general adult medical examination without abnormal findings: Secondary | ICD-10-CM | POA: Insufficient documentation

## 2011-01-29 NOTE — Assessment & Plan Note (Signed)
Will refer to GYN for pap and evaluation of her hot flashes.

## 2011-01-29 NOTE — Assessment & Plan Note (Signed)
Improved on Cymbalta. Continue therapy.

## 2011-01-29 NOTE — Assessment & Plan Note (Addendum)
She is due for colonoscopy. Refer to GI.  Immunizations reviewed and up to date. Pt counseled on diet, exercise and weight loss. She had mammogram in November which noted a breast cyst (on ultrasound). She will need a 1 year follow up.

## 2011-01-30 ENCOUNTER — Ambulatory Visit: Payer: 59 | Admitting: Licensed Clinical Social Worker

## 2011-01-31 ENCOUNTER — Ambulatory Visit (INDEPENDENT_AMBULATORY_CARE_PROVIDER_SITE_OTHER): Payer: 59 | Admitting: Licensed Clinical Social Worker

## 2011-01-31 DIAGNOSIS — F411 Generalized anxiety disorder: Secondary | ICD-10-CM

## 2011-01-31 DIAGNOSIS — F331 Major depressive disorder, recurrent, moderate: Secondary | ICD-10-CM

## 2011-02-02 ENCOUNTER — Encounter: Payer: Self-pay | Admitting: Family

## 2011-02-10 ENCOUNTER — Encounter (HOSPITAL_COMMUNITY): Payer: Self-pay

## 2011-02-10 ENCOUNTER — Emergency Department (HOSPITAL_COMMUNITY)
Admission: EM | Admit: 2011-02-10 | Discharge: 2011-02-10 | Disposition: A | Discharge status: Home / Self Care | Payer: 59 | Source: Home / Self Care | Attending: Emergency Medicine | Admitting: Emergency Medicine

## 2011-02-10 DIAGNOSIS — I10 Essential (primary) hypertension: Secondary | ICD-10-CM

## 2011-02-10 DIAGNOSIS — N390 Urinary tract infection, site not specified: Secondary | ICD-10-CM

## 2011-02-10 LAB — POCT URINALYSIS DIP (DEVICE)
Glucose, UA: NEGATIVE mg/dL
Nitrite: NEGATIVE
Urobilinogen, UA: 0.2 mg/dL (ref 0.0–1.0)
pH: 6.5 (ref 5.0–8.0)

## 2011-02-10 MED ORDER — CEPHALEXIN 500 MG PO CAPS: 500.0000 mg | ORAL_CAPSULE | Freq: Three times a day (TID) | ORAL | Status: AC

## 2011-02-10 MED ORDER — PHENAZOPYRIDINE HCL 200 MG PO TABS: 200.0000 mg | ORAL_TABLET | Freq: Three times a day (TID) | ORAL | Status: AC | PRN

## 2011-02-10 NOTE — ED Notes (Signed)
Pt has low abdominal pain, burning and sees blood when wiping for one week.

## 2011-02-10 NOTE — ED Provider Notes (Signed)
History     CSN: [id number]  Arrival date & time 02/10/11  1039   First MD Initiated Contact with Patient 02/10/11 1202      Chief Complaint  Patient presents with  . Urinary Tract Infection    (Consider location/radiation/quality/duration/timing/severity/associated sxs/prior treatment) HPI Comments: The patient has had a two-day history of dysuria, burning with urination, either pressure, bladder spasm, frequency, urgency, hematuria, pelvic pain, and chills. She denies any fever, nausea, vomiting, or GYN symptoms. She has had urinary tract infections in the past, but none recently.  Patient is a 50 y.o. female presenting with urinary tract infection.  Urinary Tract Infection Associated symptoms include abdominal pain.    Past Medical History  Diagnosis Date  . Hypertension   . Hyperlipidemia   . Hyperglycemia   . Anemia   . Anxiety   . Depression     Past Surgical History  Procedure Date  . Cholecystectomy 1985  . Appendectomy 1985  . Tubal ligation 1985  . Knee surgery 1983    arthroscopy?  . Dilation and curettage of uterus 2005  . Polypectomy 2006    ?anal polyp?    Family History  Problem Relation Age of Onset  . Hypertension Father   . Sarcoidosis Brother   . Stroke Maternal Grandmother   . Alcohol abuse Brother   . Cirrhosis Brother     History  Substance Use Topics  . Smoking status: Current Everyday Smoker    Types: Cigarettes  . Smokeless tobacco: Never Used   Comment: 10 cigarettes daily  . Alcohol Use: No    OB History    Grav Para Term Preterm Abortions TAB SAB Ect Mult Living                  Review of Systems  Constitutional: Positive for chills. Negative for fever.  Gastrointestinal: Positive for abdominal pain. Negative for nausea and vomiting.  Genitourinary: Positive for dysuria, urgency, frequency and hematuria. Negative for flank pain.    Allergies  Sulfonamide derivatives  Home Medications   Current Outpatient Rx    Name Route Sig Dispense Refill  . AMLODIPINE-VALSARTAN-HCTZ 5-160-12.5 MG PO TABS Oral Take 1 tablet by mouth daily. 30 tablet 2  . BIOTIN 5000 MCG PO TABS Oral Take 1 tablet by mouth daily.      . CEPHALEXIN 500 MG PO CAPS Oral Take 1 capsule (500 mg total) by mouth 3 (three) times daily. 30 capsule 0  . DULOXETINE HCL 60 MG PO CPEP Oral Take 1 capsule (60 mg total) by mouth daily. 30 capsule 5  . NEBIVOLOL HCL 10 MG PO TABS Oral Take 1 tablet (10 mg total) by mouth daily. 30 tablet 3  . PHENAZOPYRIDINE HCL 200 MG PO TABS Oral Take 1 tablet (200 mg total) by mouth 3 (three) times daily as needed for pain. 15 tablet 0  . ROSUVASTATIN CALCIUM 10 MG PO TABS Oral Take 10 mg by mouth daily.      [name] VITAMIN D (ERGOCALCIFEROL) 50000 UNITS PO CAPS Oral Take 50,000 Units by mouth every 7 (seven) days.        BP 180/82  Pulse 71  Temp(Src) 98.7 F (37.1 C) (Oral)  Resp 16  SpO2 100%  LMP 12/25/2008  Physical Exam  Nursing note and vitals reviewed. Constitutional: She appears well-developed and well-nourished. No distress.  Cardiovascular: Normal rate and regular rhythm.  Exam reveals no gallop and no friction rub.   No murmur heard. Pulmonary/Chest: Effort normal. No  respiratory distress. She has no wheezes. She has no rales.  Abdominal: Soft. Bowel sounds are normal. She exhibits no distension and no mass. There is no hepatosplenomegaly. There is tenderness (there is mild suprapubic tenderness without guarding or rebound). There is no rebound, no guarding and no CVA tenderness.  Skin: She is not diaphoretic.    ED Course  Procedures (including critical care time)  Results for orders placed during the hospital encounter of 02/10/11  POCT URINALYSIS DIP (DEVICE)      Component Value Range   Glucose, UA NEGATIVE  NEGATIVE (mg/dL)   Bilirubin Urine NEGATIVE  NEGATIVE    Ketones, ur NEGATIVE  NEGATIVE (mg/dL)   Specific Gravity, Urine 1.015  1.005 - 1.030    Hgb urine dipstick MODERATE (*)  NEGATIVE    pH 6.5  5.0 - 8.0    Protein, ur 100 (*) NEGATIVE (mg/dL)   Urobilinogen, UA 0.2  0.0 - 1.0 (mg/dL)   Nitrite NEGATIVE  NEGATIVE    Leukocytes, UA LARGE (*) NEGATIVE      Labs Reviewed  POCT URINALYSIS DIP (DEVICE) - Abnormal; Notable for the following:    Hgb urine dipstick MODERATE (*)    Protein, ur 100 (*)    Leukocytes, UA LARGE (*) Biochemical Testing Only. Please order routine urinalysis from main lab if confirmatory testing is needed.   All other components within normal limits  URINE CULTURE   No results found.   1. UTI (lower urinary tract infection)   2. Hypertension       MDM  She has a urinary tract infection. We'll treat with cephalexin and Pyridium. A urine culture was obtained.        Roque Lias, MD 02/10/11 2158

## 2011-02-12 LAB — URINE CULTURE

## 2011-02-13 ENCOUNTER — Telehealth (HOSPITAL_COMMUNITY): Payer: Self-pay | Admitting: *Deleted

## 2011-02-13 LAB — HM COLONOSCOPY

## 2011-02-13 NOTE — ED Notes (Signed)
She has coag negative Staph which is a skin contaminant and does not require treatment. It's impossible to tell for sure whether she has an infection or not since the coag neg Staph could have masked something else. I would still recommend that she finish up the cephalexin and return if no better at the end of antibiotic treatment.  Leslee Home

## 2011-02-15 ENCOUNTER — Telehealth: Payer: Self-pay | Admitting: *Deleted

## 2011-02-15 ENCOUNTER — Telehealth: Payer: Self-pay | Admitting: Family

## 2011-02-15 MED ORDER — ROSUVASTATIN CALCIUM 10 MG PO TABS: 10.0000 mg | ORAL_TABLET | Freq: Every day | ORAL | Status: DC

## 2011-02-15 MED ORDER — NICOTINE 21 MG/24HR TD PT24: MEDICATED_PATCH | TRANSDERMAL | Status: DC

## 2011-02-15 NOTE — Telephone Encounter (Signed)
Received message from pharmacy that pt is requesting an rx for the nicotine patch as it will be covered under her ins. Per pharmacist, we will need to send new rxs when pt is to titrate down. Rx sent to pharmacy for 21mg  patch daily x 6 weeks #42 x no refills. Left message for pt to return my call re: refill process for the patch.

## 2011-02-15 NOTE — Telephone Encounter (Signed)
Refill sent to pharmacy #30 x 3 refills. 

## 2011-02-15 NOTE — Telephone Encounter (Signed)
Pt returned my call and was advised to call us when she completes her 6 week supply of 21mg  patch so we can send Rx for the titration. Pt voices understanding.

## 2011-02-15 NOTE — Telephone Encounter (Signed)
Refill-crestor 10mg  tablet. Take one tablet by mouth once daily. Qty 30 last fill 11.21.12

## 2011-02-19 ENCOUNTER — Ambulatory Visit (AMBULATORY_SURGERY_CENTER): Payer: 59 | Admitting: *Deleted

## 2011-02-19 VITALS — Ht 64.0 in | Wt 172.0 lb

## 2011-02-19 DIAGNOSIS — Z1211 Encounter for screening for malignant neoplasm of colon: Secondary | ICD-10-CM

## 2011-02-19 MED ORDER — PEG-KCL-NACL-NASULF-NA ASC-C 100 G PO SOLR: ORAL | Status: DC

## 2011-02-20 ENCOUNTER — Encounter: Payer: Self-pay | Admitting: Family

## 2011-02-20 ENCOUNTER — Ambulatory Visit (INDEPENDENT_AMBULATORY_CARE_PROVIDER_SITE_OTHER): Payer: 59 | Admitting: Licensed Clinical Social Worker

## 2011-02-20 ENCOUNTER — Telehealth: Payer: Self-pay | Admitting: *Deleted

## 2011-02-20 ENCOUNTER — Ambulatory Visit (INDEPENDENT_AMBULATORY_CARE_PROVIDER_SITE_OTHER): Payer: 59 | Admitting: Family

## 2011-02-20 DIAGNOSIS — F32A Depression, unspecified: Secondary | ICD-10-CM

## 2011-02-20 DIAGNOSIS — F411 Generalized anxiety disorder: Secondary | ICD-10-CM

## 2011-02-20 DIAGNOSIS — F341 Dysthymic disorder: Secondary | ICD-10-CM

## 2011-02-20 DIAGNOSIS — F329 Major depressive disorder, single episode, unspecified: Secondary | ICD-10-CM

## 2011-02-20 DIAGNOSIS — I10 Essential (primary) hypertension: Secondary | ICD-10-CM

## 2011-02-20 DIAGNOSIS — F331 Major depressive disorder, recurrent, moderate: Secondary | ICD-10-CM

## 2011-02-20 DIAGNOSIS — B373 Candidiasis of vulva and vagina: Secondary | ICD-10-CM

## 2011-02-20 MED ORDER — FLUCONAZOLE 150 MG PO TABS: ORAL_TABLET | ORAL | Status: DC

## 2011-02-20 NOTE — Patient Instructions (Signed)
Call if your symptoms worsen or if your symptoms do not improve.

## 2011-02-20 NOTE — Assessment & Plan Note (Signed)
Clinically stable with therapy and cymbalta.

## 2011-02-20 NOTE — Telephone Encounter (Signed)
Patient called and left voice message stating she is taking a Rx for Keflex and has developed a yeast infection. She would like to know if a Rx for Diflucan could be sent to her pharmacy.

## 2011-02-20 NOTE — Progress Notes (Signed)
Subjective:    Patient ID: Kaitlyn Harper, female    DOB: 1960-07-02, 51 y.o.   MRN: [id number]  HPI  Kaitlyn Harper is a 51 yr old female who presents today for FMLA paperwork and to discuss vaginal itching.   Vaginal itching. Reports that she started keflex 5 days ago for a urinary tract infection.  This was prescribed by Urgent Care.  She reports 3 day hx of vaginal  itching and associated white discharge.  Anxiety- she is seeing her therapist twice a month.  Reports that her anxiety is well controlled. She continues cymbalta.     Review of Systems See HPI  Past Medical History  Diagnosis Date  . Hypertension   . Hyperlipidemia   . Hyperglycemia   . Anemia   . Anxiety   . Depression     History   Social History  . Marital Status: Divorced    Spouse Name: N/A    Number of Children: 3  . Years of Education: N/A   Occupational History  . RN SECRETARY    Social History Main Topics  . Smoking status: Former Smoker    Types: Cigarettes    Quit date: 02/19/2011  . Smokeless tobacco: Never Used   Comment: 10 cigarettes daily  . Alcohol Use: No  . Drug Use: No  . Sexually Active: Not on file   Other Topics Concern  . Not on file   Social History Narrative   Caffeine use:  2 drinksRegular exercise: no    Past Surgical History  Procedure Date  . Cholecystectomy 1985  . Appendectomy 1985  . Tubal ligation 1985  . Knee surgery 1983    arthroscopy?  . Dilation and curettage of uterus 2005  . Polypectomy 2006    ?anal polyp?  [name] Hemorrhoid surgery 2002    Family History  Problem Relation Age of Onset  . Hypertension Father   . Sarcoidosis Brother   . Stroke Maternal Grandmother   . Alcohol abuse Brother   . Cirrhosis Brother     Allergies  Allergen Reactions  . Sulfonamide Derivatives     Current Outpatient Prescriptions on File Prior to Visit  Medication Sig Dispense Refill  . Amlodipine-Valsartan-HCTZ (EXFORGE HCT) 5-160-12.5 MG TABS Take 1  tablet by mouth daily.  30 tablet  2  . Biotin 5000 MCG TABS Take 1 tablet by mouth daily.        . cephALEXin (KEFLEX) 500 MG capsule Take 1 capsule (500 mg total) by mouth 3 (three) times daily.  30 capsule  0  . DULoxetine (CYMBALTA) 60 MG capsule Take 1 capsule (60 mg total) by mouth daily.  30 capsule  5  . nebivolol (BYSTOLIC) 10 MG tablet Take 1 tablet (10 mg total) by mouth daily.  30 tablet  3  . nicotine (NICODERM CQ - DOSED IN MG/24 HOURS) 21 mg/24hr patch Apply 1 patch (21mg ) daily for six weeks then use 14mg  patch daily for 2 weeks then use 7mg  patch daily for 2 weeks and stop.  42 patch  0  . peg 3350 powder (MOVIPREP) 100 G SOLR MOVI PREP take as directed  1 kit  0  . rosuvastatin (CRESTOR) 10 MG tablet Take 1 tablet (10 mg total) by mouth daily.  30 tablet  3  . Vitamin D, Ergocalciferol, (DRISDOL) 50000 UNITS CAPS Take 50,000 Units by mouth every 7 (seven) days.          BP 102/78  Pulse 98  Temp(Src) 97.9 F (36.6 C) (Oral)  Resp 16  Wt 176 lb 1.9 oz (79.888 kg)  LMP 12/25/2008       Objective:   Physical Exam  Constitutional: She is oriented to person, place, and time. She appears well-developed and well-nourished.  HENT:  Head: Normocephalic and atraumatic.  Cardiovascular: Normal rate and regular rhythm.   No murmur heard. Pulmonary/Chest: Effort normal and breath sounds normal. No respiratory distress. She has no wheezes. She has no rales. She exhibits no tenderness.  Musculoskeletal: She exhibits no edema.  Neurological: She is alert and oriented to person, place, and time.  Skin: Skin is warm and dry. No erythema.  Psychiatric: She has a normal mood and affect. Her behavior is normal. Judgment and thought content normal.          Assessment & Plan:

## 2011-02-20 NOTE — Assessment & Plan Note (Signed)
Will treat with Diflucan.

## 2011-02-20 NOTE — Assessment & Plan Note (Signed)
Clinically stable on Exforge HCT.  Continue same.

## 2011-02-26 ENCOUNTER — Telehealth: Payer: Self-pay | Admitting: *Deleted

## 2011-02-26 NOTE — Telephone Encounter (Signed)
Left detailed message on pt's voicemail that FMLA paperwork has been completed and left at front desk for her to pick up and to call if any questions. Copy has been sent to MR.

## 2011-02-28 ENCOUNTER — Encounter: Payer: Self-pay | Admitting: Gastroenterology

## 2011-02-28 ENCOUNTER — Ambulatory Visit (AMBULATORY_SURGERY_CENTER): Payer: 59 | Admitting: Gastroenterology

## 2011-02-28 ENCOUNTER — Other Ambulatory Visit: Payer: 59 | Admitting: Gastroenterology

## 2011-02-28 VITALS — BP 107/55 | HR 69 | Temp 98.2°F | Resp 20 | Ht 64.0 in | Wt 172.0 lb

## 2011-02-28 DIAGNOSIS — Z1211 Encounter for screening for malignant neoplasm of colon: Secondary | ICD-10-CM

## 2011-02-28 DIAGNOSIS — D126 Benign neoplasm of colon, unspecified: Secondary | ICD-10-CM

## 2011-02-28 HISTORY — PX: POLYPECTOMY: SHX149

## 2011-02-28 HISTORY — PX: COLONOSCOPY: SHX174

## 2011-02-28 HISTORY — PX: COLONOSCOPY W/ POLYPECTOMY: SHX1380

## 2011-02-28 MED ORDER — SODIUM CHLORIDE 0.9 % IV SOLN: 500.0000 mL | INTRAVENOUS | Status: DC

## 2011-02-28 NOTE — Progress Notes (Signed)
Blood pressure reading 84/41.  IV dripping wide open.  Pt's arm repositioned and retook blood pressure 95/62.  Pt warm, dry and responsive. Maw  Pt tolerated the colonoscopy well. Maw  Hung second bag of n.s. 0.9% at 0925. maw

## 2011-02-28 NOTE — Op Note (Signed)
[facility] [address] [place], [place]  [address]  COLONOSCOPY PROCEDURE REPORT  PATIENT:  Kaitlyn, Harper  MR#:  [id number] BIRTHDATE:  October 28, 1960, 50 yrs. old  GENDER:  female ENDOSCOPIST:  Rachael Fee, MD REF. BY:  Alma DownsLendell Caprice, N.P. PROCEDURE DATE:  02/28/2011 PROCEDURE:  Colonoscopy with snare polypectomy ASA CLASS:  Class II INDICATIONS:  Routine Risk Screening MEDICATIONS:   Fentanyl 50 mcg IV, These medications were titrated to patient response per physician's verbal order, Versed 6 mg IV  DESCRIPTION OF PROCEDURE:   After the risks benefits and alternatives of the procedure were thoroughly explained, informed consent was obtained.  Digital rectal exam was performed and revealed no rectal masses.   The LB 180AL [id number] endoscope was introduced through the anus and advanced to the cecum, which was identified by both the appendix and ileocecal valve, without limitations.  The quality of the prep was good..  The instrument was then slowly withdrawn as the colon was fully examined. <<PROCEDUREIMAGES>> FINDINGS:  Four diminutive sessile polyps were removed with cold snare, all were retrieved and all were sent to pathology (jar 1). Three were in transverse segment, one was in sigmoid (see image4). This was otherwise a normal examination of the colon (see image3, image1, and image6).   Retroflexed views in the rectum revealed no abnormalities. COMPLICATIONS:  None  ENDOSCOPIC IMPRESSION: 1) Four small polyps were found.  All were removed and all were sent to pathology 2) Otherwise normal examination  RECOMMENDATIONS: 1) If the polyp(s) removed today are proven to be adenomatous (pre-cancerous) polyps, you will need a colonoscopy in 3-5 years. Otherwise you should continue to follow colorectal cancer screening guidelines for "routine risk" patients with a colonoscopy in 10 years. 2) You will receive a letter within 1-2 weeks with the results of  your biopsy as well as final recommendations. Please call my office if you have not received a letter after 3 weeks.  ______________________________ Rachael Fee, MD  n. eSIGNED:   Rachael Fee at 02/28/2011 09:30 AM  Kenard Gower, [phone]

## 2011-02-28 NOTE — Progress Notes (Signed)
Patient did not experience any of the following events: a burn prior to discharge; a fall within the facility; wrong site/side/patient/procedure/implant event; or a hospital transfer or hospital admission upon discharge from the facility. (G8907) Patient did not have preoperative order for IV antibiotic SSI prophylaxis. (G8918)  

## 2011-02-28 NOTE — Patient Instructions (Signed)
FOLLOW DISCHARGE INSTRUCTIONS (BLUE AND GREEN SHEETS). Polypectomy instruction sheets given.

## 2011-03-05 ENCOUNTER — Ambulatory Visit: Payer: 59 | Admitting: Licensed Clinical Social Worker

## 2011-03-06 ENCOUNTER — Ambulatory Visit (INDEPENDENT_AMBULATORY_CARE_PROVIDER_SITE_OTHER): Payer: 59 | Admitting: Licensed Clinical Social Worker

## 2011-03-06 ENCOUNTER — Encounter: Payer: Self-pay | Admitting: Gastroenterology

## 2011-03-06 ENCOUNTER — Other Ambulatory Visit: Payer: Self-pay | Admitting: Family

## 2011-03-06 DIAGNOSIS — F331 Major depressive disorder, recurrent, moderate: Secondary | ICD-10-CM

## 2011-03-06 DIAGNOSIS — F411 Generalized anxiety disorder: Secondary | ICD-10-CM

## 2011-03-06 NOTE — Telephone Encounter (Signed)
Refill was sent to pharmacy on 01/26/11 x 5 refills for Cymbalta. Spoke to [place] and verified receipt of December authorization. Denial sent.

## 2011-03-20 ENCOUNTER — Ambulatory Visit (INDEPENDENT_AMBULATORY_CARE_PROVIDER_SITE_OTHER): Payer: 59 | Admitting: Licensed Clinical Social Worker

## 2011-03-20 DIAGNOSIS — F331 Major depressive disorder, recurrent, moderate: Secondary | ICD-10-CM

## 2011-04-03 ENCOUNTER — Encounter: Payer: Self-pay | Admitting: Internal Medicine

## 2011-04-03 ENCOUNTER — Ambulatory Visit (INDEPENDENT_AMBULATORY_CARE_PROVIDER_SITE_OTHER): Payer: 59 | Admitting: Internal Medicine

## 2011-04-03 VITALS — BP 125/67 | HR 78 | Temp 98.0°F | Resp 12 | Ht 64.0 in | Wt 173.0 lb

## 2011-04-03 DIAGNOSIS — F32A Depression, unspecified: Secondary | ICD-10-CM

## 2011-04-03 DIAGNOSIS — F329 Major depressive disorder, single episode, unspecified: Secondary | ICD-10-CM

## 2011-04-03 NOTE — Progress Notes (Signed)
  Subjective:    Patient ID: Kaitlyn Harper, female    DOB: [birth year], 51 y.o.   MRN: [id number]  HPI  error  Review of Systems     Objective:   Physical Exam        Assessment & Plan:

## 2011-04-05 ENCOUNTER — Ambulatory Visit (INDEPENDENT_AMBULATORY_CARE_PROVIDER_SITE_OTHER): Payer: 59 | Admitting: Licensed Clinical Social Worker

## 2011-04-05 DIAGNOSIS — F331 Major depressive disorder, recurrent, moderate: Secondary | ICD-10-CM

## 2011-04-05 DIAGNOSIS — F411 Generalized anxiety disorder: Secondary | ICD-10-CM

## 2011-04-16 ENCOUNTER — Other Ambulatory Visit: Payer: Self-pay | Admitting: Family

## 2011-05-01 ENCOUNTER — Ambulatory Visit: Payer: 59 | Admitting: Licensed Clinical Social Worker

## 2011-05-02 ENCOUNTER — Ambulatory Visit (INDEPENDENT_AMBULATORY_CARE_PROVIDER_SITE_OTHER): Payer: 59 | Admitting: Licensed Clinical Social Worker

## 2011-05-02 DIAGNOSIS — F411 Generalized anxiety disorder: Secondary | ICD-10-CM

## 2011-05-02 DIAGNOSIS — F331 Major depressive disorder, recurrent, moderate: Secondary | ICD-10-CM

## 2011-05-21 ENCOUNTER — Ambulatory Visit: Payer: 59 | Admitting: Family

## 2011-05-23 ENCOUNTER — Encounter: Payer: Self-pay | Admitting: Family

## 2011-05-23 ENCOUNTER — Ambulatory Visit (INDEPENDENT_AMBULATORY_CARE_PROVIDER_SITE_OTHER): Payer: 59 | Admitting: Family

## 2011-05-23 VITALS — BP 116/74 | HR 80 | Temp 98.1°F | Resp 16 | Ht 64.0 in | Wt 174.1 lb

## 2011-05-23 DIAGNOSIS — F419 Anxiety disorder, unspecified: Secondary | ICD-10-CM

## 2011-05-23 DIAGNOSIS — F32A Depression, unspecified: Secondary | ICD-10-CM

## 2011-05-23 DIAGNOSIS — L538 Other specified erythematous conditions: Secondary | ICD-10-CM

## 2011-05-23 DIAGNOSIS — N951 Menopausal and female climacteric states: Secondary | ICD-10-CM

## 2011-05-23 DIAGNOSIS — J302 Other seasonal allergic rhinitis: Secondary | ICD-10-CM | POA: Insufficient documentation

## 2011-05-23 DIAGNOSIS — F17200 Nicotine dependence, unspecified, uncomplicated: Secondary | ICD-10-CM

## 2011-05-23 DIAGNOSIS — F341 Dysthymic disorder: Secondary | ICD-10-CM

## 2011-05-23 DIAGNOSIS — R7309 Other abnormal glucose: Secondary | ICD-10-CM

## 2011-05-23 DIAGNOSIS — I10 Essential (primary) hypertension: Secondary | ICD-10-CM

## 2011-05-23 DIAGNOSIS — R7303 Prediabetes: Secondary | ICD-10-CM

## 2011-05-23 DIAGNOSIS — J309 Allergic rhinitis, unspecified: Secondary | ICD-10-CM

## 2011-05-23 DIAGNOSIS — L304 Erythema intertrigo: Secondary | ICD-10-CM

## 2011-05-23 MED ORDER — NYSTATIN 100000 UNIT/GM EX POWD: Freq: Four times a day (QID) | CUTANEOUS | Status: DC

## 2011-05-23 NOTE — Assessment & Plan Note (Signed)
BP Readings from Last 3 Encounters:  05/23/11 116/74  04/03/11 125/67  02/28/11 107/55   BP is stable on current medications. Continue same, obtain bmet.

## 2011-05-23 NOTE — Assessment & Plan Note (Signed)
Recommended trial of claritin.  °

## 2011-05-23 NOTE — Assessment & Plan Note (Signed)
Add nystatin powder.  

## 2011-05-23 NOTE — Assessment & Plan Note (Signed)
This is stable and is being managed by psychiatry. 

## 2011-05-23 NOTE — Assessment & Plan Note (Signed)
I applauded pt for quitting smoking.   

## 2011-05-23 NOTE — Patient Instructions (Signed)
Please complete your lab work prior to leaving today.  Call if rash worsens, or if no improvement in 2-3 days.  Follow up in 3 months, sooner if problems/concerns.

## 2011-05-23 NOTE — Assessment & Plan Note (Signed)
Improving.  Pt feels that they are tolerable at this point.

## 2011-05-23 NOTE — Progress Notes (Signed)
Subjective:    Patient ID: Kaitlyn Harper, female    DOB: 27-Jun-1960, 51 y.o.   MRN: [id number]  HPI  Kaitlyn Harper is a 51 yr old female who presents today for follow up and to discuss rash.  Rash beneath breasts- "smells." Started about 1 month ago.  Hot flashes-  Last period was in 2010.  Hot flashes not as bad as they were. Feels like they are tolerable.   Anxiety- reports that she continues to see susan and Data processing manager.  She reports that she is on cymbalta and abilify.  Tapering off on cymbalta.  Quit smoking in January !  Allergies/sneezing attacks- last few weeks.   Review of Systems See HPI  Past Medical History  Diagnosis Date  . Hypertension   . Hyperlipidemia   . Hyperglycemia   . Anemia   . Anxiety   . Depression     History   Social History  . Marital Status: Divorced    Spouse Name: N/A    Number of Children: 3  . Years of Education: N/A   Occupational History  . RN SECRETARY    Social History Main Topics  . Smoking status: Former Smoker    Types: Cigarettes    Quit date: 02/19/2011  . Smokeless tobacco: Never Used   Comment: 10 cigarettes daily  . Alcohol Use: Yes     socially  . Drug Use: No  . Sexually Active: Yes    Birth Control/ Protection: Post-menopausal   Other Topics Concern  . Not on file   Social History Narrative   Caffeine use:  2 drinksRegular exercise: no    Past Surgical History  Procedure Date  . Cholecystectomy 1985  . Appendectomy 1985  . Tubal ligation 1985  . Knee surgery 1983    arthroscopy?  . Dilation and curettage of uterus 2005  . Polypectomy 2006    ?anal polyp?  [name] Hemorrhoid surgery 2002  . Tear duct probing 2004    Family History  Problem Relation Age of Onset  . Hypertension Father   . Sarcoidosis Brother   . Stroke Maternal Grandmother   . Alcohol abuse Brother   . Cirrhosis Brother     Allergies  Allergen Reactions  . Sulfonamide Derivatives Hives    Light sensitivity    Current  Outpatient Prescriptions on File Prior to Visit  Medication Sig Dispense Refill  . Biotin 5000 MCG TABS Take 1 tablet by mouth daily.        [name] EXFORGE HCT 5-160-12.5 MG TABS TAKE 1 TABLET BY MOUTH ONCE DAILY  30 tablet  2  . LamoTRIgine (LAMICTAL ODT) 25 (21)-50 (7) MG KIT Take 50mg  in the morning and 100mg  at bedtime.      [name] loratadine (CLARITIN) 10 MG tablet Take 10 mg by mouth daily.      . nebivolol (BYSTOLIC) 10 MG tablet Take 1 tablet (10 mg total) by mouth daily.  30 tablet  3  . nicotine (NICODERM CQ - DOSED IN MG/24 HOURS) 21 mg/24hr patch Apply 1 patch (21mg ) daily for six weeks then use 14mg  patch daily for 2 weeks then use 7mg  patch daily for 2 weeks and stop.  42 patch  0  . rosuvastatin (CRESTOR) 10 MG tablet Take 1 tablet (10 mg total) by mouth daily.  30 tablet  3  . Vitamin D, Ergocalciferol, (DRISDOL) 50000 UNITS CAPS Take 50,000 Units by mouth every 7 (seven) days.  BP 116/74  Pulse 80  Temp(Src) 98.1 F (36.7 C) (Oral)  Resp 16  Ht 5\' 4"  (1.626 m)  Wt 174 lb 1.9 oz (78.98 kg)  BMI 29.89 kg/m2  SpO2 99%  LMP 12/25/2008       Objective:     Physical Exam  Constitutional: She appears well-developed and well-nourished. No distress.  HENT:  Head: Normocephalic and atraumatic.  Right Ear: Tympanic membrane and ear canal normal.  Left Ear: Tympanic membrane and ear canal normal.  Cardiovascular: Normal rate and regular rhythm.   No murmur heard. Pulmonary/Chest: Effort normal and breath sounds normal. No respiratory distress. She has no wheezes. She has no rales. She exhibits no tenderness.  Musculoskeletal: She exhibits no edema.  Skin: Skin is warm and dry.       Slightly hyperpigmented rash noted beneath breasts.  Psychiatric: She has a normal mood and affect. Her behavior is normal. Judgment and thought content normal.          Assessment & Plan:

## 2011-05-24 ENCOUNTER — Telehealth: Payer: Self-pay | Admitting: Family

## 2011-05-24 LAB — BASIC METABOLIC PANEL WITH GFR
BUN: 8 mg/dL (ref 6–23)
CO2: 26 mEq/L (ref 19–32)
GFR, Est African American: 63 mL/min
Glucose, Bld: 124 mg/dL — ABNORMAL HIGH (ref 70–99)
Potassium: 3.6 mEq/L (ref 3.5–5.3)

## 2011-05-24 MED ORDER — METFORMIN HCL 500 MG PO TABS: 500.0000 mg | ORAL_TABLET | Freq: Two times a day (BID) | ORAL | Status: DC

## 2011-05-24 NOTE — Telephone Encounter (Signed)
Pls call pt and let her know that her sugar has gone up over the last 4 months.  A1C was 6.0, now up to 6.5 which is officially "diabetes."  I would like for her to add metformin- one tablet daily for one week, then increase to bid.  Also, add aspirin 81mg  once daily to prevent heart attack. Avoid concentrated sweets, try to work on diet/exercise/weight loss. Follow up in July as scheduled.

## 2011-05-25 ENCOUNTER — Other Ambulatory Visit: Payer: Self-pay | Admitting: Family

## 2011-05-25 NOTE — Telephone Encounter (Signed)
Left message on machine to return my call. 

## 2011-05-28 NOTE — Telephone Encounter (Signed)
Notified pt and she voices understanding. 

## 2011-06-06 ENCOUNTER — Ambulatory Visit: Payer: 59 | Admitting: Licensed Clinical Social Worker

## 2011-06-13 ENCOUNTER — Ambulatory Visit (INDEPENDENT_AMBULATORY_CARE_PROVIDER_SITE_OTHER): Payer: 59 | Admitting: Licensed Clinical Social Worker

## 2011-06-13 DIAGNOSIS — F331 Major depressive disorder, recurrent, moderate: Secondary | ICD-10-CM

## 2011-06-13 DIAGNOSIS — F411 Generalized anxiety disorder: Secondary | ICD-10-CM

## 2011-07-05 ENCOUNTER — Other Ambulatory Visit: Payer: Self-pay | Admitting: Family

## 2011-07-23 ENCOUNTER — Other Ambulatory Visit: Payer: Self-pay | Admitting: Family

## 2011-07-24 ENCOUNTER — Telehealth: Payer: Self-pay | Admitting: Family

## 2011-07-24 MED ORDER — VITAMIN D (ERGOCALCIFEROL) 1.25 MG (50000 UNIT) PO CAPS: 50000.0000 [IU] | ORAL_CAPSULE | ORAL | Status: DC

## 2011-07-24 NOTE — Telephone Encounter (Signed)
Please advise if ok to continue Vit D 50,000 units? If so, how many refills may we give?

## 2011-07-24 NOTE — Telephone Encounter (Signed)
OK to give 4 tabs with 5 refills.

## 2011-07-24 NOTE — Telephone Encounter (Signed)
Refill sent to pharmacy.   

## 2011-08-28 ENCOUNTER — Encounter: Payer: Self-pay | Admitting: Family

## 2011-08-28 ENCOUNTER — Ambulatory Visit (INDEPENDENT_AMBULATORY_CARE_PROVIDER_SITE_OTHER): Payer: 59 | Admitting: Family

## 2011-08-28 VITALS — BP 118/74 | HR 75 | Temp 98.2°F | Resp 16 | Ht 64.0 in | Wt 163.0 lb

## 2011-08-28 DIAGNOSIS — F329 Major depressive disorder, single episode, unspecified: Secondary | ICD-10-CM

## 2011-08-28 DIAGNOSIS — F341 Dysthymic disorder: Secondary | ICD-10-CM

## 2011-08-28 DIAGNOSIS — E119 Type 2 diabetes mellitus without complications: Secondary | ICD-10-CM

## 2011-08-28 DIAGNOSIS — F32A Depression, unspecified: Secondary | ICD-10-CM

## 2011-08-28 DIAGNOSIS — I10 Essential (primary) hypertension: Secondary | ICD-10-CM

## 2011-08-28 DIAGNOSIS — L538 Other specified erythematous conditions: Secondary | ICD-10-CM

## 2011-08-28 DIAGNOSIS — Z23 Encounter for immunization: Secondary | ICD-10-CM

## 2011-08-28 DIAGNOSIS — L304 Erythema intertrigo: Secondary | ICD-10-CM

## 2011-08-28 NOTE — Assessment & Plan Note (Signed)
Stable. Continue therapy and follow up with psychiatry.

## 2011-08-28 NOTE — Patient Instructions (Addendum)
Please complete your lab work prior to leaving. Please schedule a follow up appointment in 3 months.  

## 2011-08-28 NOTE — Assessment & Plan Note (Signed)
BP stable off of BP meds. Continue low sodium diet.

## 2011-08-28 NOTE — Assessment & Plan Note (Signed)
Resolved

## 2011-08-28 NOTE — Assessment & Plan Note (Signed)
On metformin.  Check A1C. Pneumovax today.

## 2011-08-28 NOTE — Progress Notes (Signed)
Subjective:    Patient ID: Kaitlyn Harper, female    DOB: [birth year], 51 y.o.   MRN: [id number]  HPI  Ms.  [name] is a 51 yr old female who presents today for follow up  1) DM2- started on metformin last visit. Had diarrhea the first few days, but then GI symptoms resolved.   2) HTN- off of BP meds.    3) Anxiety- follows with [name] and Rosebud Poles NP.  Reports things are going smoothly.   4) Intertrigo- improved beneath breasts with baby powder.    Review of Systems See HPI  Past Medical History  Diagnosis Date  . Hypertension   . Hyperlipidemia   . Hyperglycemia   . Anemia   . Anxiety   . Depression     History   Social History  . Marital Status: Divorced    Spouse Name: N/A    Number of Children: 3  . Years of Education: N/A   Occupational History  . RN SECRETARY    Social History Main Topics  . Smoking status: Former Smoker    Types: Cigarettes    Quit date: 02/19/2011  . Smokeless tobacco: Never Used   Comment: 10 cigarettes daily  . Alcohol Use: Yes     socially  . Drug Use: No  . Sexually Active: Yes    Birth Control/ Protection: Post-menopausal   Other Topics Concern  . Not on file   Social History Narrative   Caffeine use:  2 drinksRegular exercise: no    Past Surgical History  Procedure Date  . Cholecystectomy 1985  . Appendectomy 1985  . Tubal ligation 1985  . Knee surgery 1983    arthroscopy?  . Dilation and curettage of uterus 2005  . Polypectomy 2006    ?anal polyp?  [name] Hemorrhoid surgery 2002  . Tear duct probing 2004    Family History  Problem Relation Age of Onset  . Hypertension Father   . Sarcoidosis Brother   . Stroke Maternal Grandmother   . Alcohol abuse Brother   . Cirrhosis Brother     Allergies  Allergen Reactions  . Sulfonamide Derivatives Hives    Light sensitivity    Current Outpatient Prescriptions on File Prior to Visit  Medication Sig Dispense Refill  . ARIPiprazole (ABILIFY) 5 MG tablet  Take 5 mg by mouth daily.      . Biotin 5000 MCG TABS Take 1 tablet by mouth daily.        [name] BYSTOLIC 10 MG tablet TAKE 1 TABLET (10 MG TOTAL) BY MOUTH DAILY.  30 tablet  3  . CRESTOR 10 MG tablet TAKE 1 TABLET BY MOUTH DAILY.  30 tablet  3  . DULoxetine (CYMBALTA) 30 MG capsule Take 30 mg by mouth daily.      [name] EXFORGE HCT 5-160-12.5 MG TABS TAKE 1 TABLET BY MOUTH ONCE DAILY  30 tablet  2  . LamoTRIgine (LAMICTAL ODT) 25 (21)-50 (7) MG KIT Take 50mg  in the morning and 100mg  at bedtime.      [name] loratadine (CLARITIN) 10 MG tablet Take 10 mg by mouth daily.      . metFORMIN (GLUCOPHAGE) 500 MG tablet Take 1 tablet (500 mg total) by mouth 2 (two) times daily with a meal.  60 tablet  2  . Vitamin D, Ergocalciferol, (DRISDOL) 50000 UNITS CAPS Take 1 capsule (50,000 Units total) by mouth every 7 (seven) days.  4 capsule  5  . aspirin EC 81 MG tablet  Take 81 mg by mouth daily.      [name] nystatin (MYCOSTATIN) powder Apply topically 4 (four) times daily.  30 g  3    BP 118/74  Pulse 75  Temp 98.2 F (36.8 C) (Oral)  Resp 16  Ht 5\' 4"  (1.626 m)  Wt 163 lb (73.936 kg)  BMI 27.98 kg/m2  SpO2 99%  LMP 12/25/2008       Objective:   Physical Exam  Constitutional: She is oriented to person, place, and time. She appears well-developed and well-nourished. No distress.  Cardiovascular: Normal rate and regular rhythm.   No murmur heard. Pulmonary/Chest: Effort normal and breath sounds normal. No respiratory distress. She has no wheezes. She has no rales. She exhibits no tenderness.  Neurological: She is alert and oriented to person, place, and time.  Psychiatric: She has a normal mood and affect. Her behavior is normal. Judgment and thought content normal.          Assessment & Plan:

## 2011-08-29 ENCOUNTER — Encounter: Payer: Self-pay | Admitting: Family

## 2011-09-12 ENCOUNTER — Other Ambulatory Visit: Payer: Self-pay | Admitting: Family

## 2011-11-27 ENCOUNTER — Ambulatory Visit (INDEPENDENT_AMBULATORY_CARE_PROVIDER_SITE_OTHER): Payer: 59 | Admitting: Family

## 2011-11-27 ENCOUNTER — Encounter: Payer: Self-pay | Admitting: Family

## 2011-11-27 VITALS — BP 124/84 | HR 71 | Temp 98.6°F | Resp 18 | Ht 64.0 in | Wt 164.1 lb

## 2011-11-27 DIAGNOSIS — F341 Dysthymic disorder: Secondary | ICD-10-CM

## 2011-11-27 DIAGNOSIS — F329 Major depressive disorder, single episode, unspecified: Secondary | ICD-10-CM

## 2011-11-27 DIAGNOSIS — E119 Type 2 diabetes mellitus without complications: Secondary | ICD-10-CM

## 2011-11-27 DIAGNOSIS — I10 Essential (primary) hypertension: Secondary | ICD-10-CM

## 2011-11-27 DIAGNOSIS — R002 Palpitations: Secondary | ICD-10-CM

## 2011-11-27 DIAGNOSIS — F32A Depression, unspecified: Secondary | ICD-10-CM

## 2011-11-27 LAB — BASIC METABOLIC PANEL
BUN: 11 mg/dL (ref 6–23)
Calcium: 10.8 mg/dL — ABNORMAL HIGH (ref 8.4–10.5)
Creat: 1.11 mg/dL — ABNORMAL HIGH (ref 0.50–1.10)
Glucose, Bld: 87 mg/dL (ref 70–99)
Potassium: 4.4 mEq/L (ref 3.5–5.3)

## 2011-11-27 LAB — HEMOGLOBIN A1C
Hgb A1c MFr Bld: 5.6 % (ref <5.7)
Mean Plasma Glucose: 114 mg/dL (ref <117)

## 2011-11-27 NOTE — Assessment & Plan Note (Signed)
Obtain A1C. 

## 2011-11-27 NOTE — Assessment & Plan Note (Signed)
Defer management to psychiatry.  °

## 2011-11-27 NOTE — Patient Instructions (Addendum)
Please complete your blood work prior to leaving. Please schedule a follow up appointment in 3 months.  

## 2011-11-27 NOTE — Progress Notes (Signed)
Subjective:    Patient ID: Kaitlyn Harper, female    DOB: [birth year], 51 y.o.   MRN: [id number]  HPI  Kaitlyn Harper is a 51 yr old female who presents today for follow up.  HTN- reports that she is having trouble affording bp meds as her flex spending account is empty until January.  Denies CP, SOB or swelling.  Depression- reports that she generally has depression in the fall. She is now established with Rosebud Poles NP/and working with [name]   Review of Systems See HPI  Past Medical History  Diagnosis Date  . Hypertension   . Hyperlipidemia   . Hyperglycemia   . Anemia   . Anxiety   . Depression     History   Social History  . Marital Status: Divorced    Spouse Name: N/A    Number of Children: 3  . Years of Education: N/A   Occupational History  . RN SECRETARY    Social History Main Topics  . Smoking status: Former Smoker    Types: Cigarettes    Quit date: 02/19/2011  . Smokeless tobacco: Never Used   Comment: 10 cigarettes daily  . Alcohol Use: Yes     socially  . Drug Use: No  . Sexually Active: Yes    Birth Control/ Protection: Post-menopausal   Other Topics Concern  . Not on file   Social History Narrative   Caffeine use:  2 drinksRegular exercise: no    Past Surgical History  Procedure Date  . Cholecystectomy 1985  . Appendectomy 1985  . Tubal ligation 1985  . Knee surgery 1983    arthroscopy?  . Dilation and curettage of uterus 2005  . Polypectomy 2006    ?anal polyp?  [name] Hemorrhoid surgery 2002  . Tear duct probing 2004    Family History  Problem Relation Age of Onset  . Hypertension Father   . Sarcoidosis Brother   . Stroke Maternal Grandmother   . Alcohol abuse Brother   . Cirrhosis Brother     Allergies  Allergen Reactions  . Sulfonamide Derivatives Hives    Light sensitivity    Current Outpatient Prescriptions on File Prior to Visit  Medication Sig Dispense Refill  . ARIPiprazole (ABILIFY) 5 MG tablet Take 5 mg  by mouth daily.      [name] aspirin EC 81 MG tablet Take 81 mg by mouth daily.      [name] BYSTOLIC 10 MG tablet TAKE 1 TABLET (10 MG TOTAL) BY MOUTH DAILY.  30 tablet  3  . CRESTOR 10 MG tablet TAKE 1 TABLET BY MOUTH DAILY.  30 tablet  3  . EXFORGE HCT 5-160-12.5 MG TABS TAKE 1 TABLET BY MOUTH ONCE DAILY  30 tablet  2  . LamoTRIgine (LAMICTAL ODT) 25 (21)-50 (7) MG KIT Take 100mg  in the morning and 100mg  at bedtime.      [name] loratadine (CLARITIN) 10 MG tablet Take 10 mg by mouth daily as needed.       . metFORMIN (GLUCOPHAGE) 500 MG tablet TAKE 1 TABLET (500 MG TOTAL) BY MOUTH 2 (TWO) TIMES DAILY WITH A MEAL.  60 tablet  2  . Vitamin D, Ergocalciferol, (DRISDOL) 50000 UNITS CAPS Take 1 capsule (50,000 Units total) by mouth every 7 (seven) days.  4 capsule  5    BP 124/84  Pulse 71  Temp 98.6 F (37 C) (Oral)  Resp 18  Ht 5\' 4"  (1.626 m)  Wt 164 lb 1.3 oz (74.426  kg)  BMI 28.16 kg/m2  SpO2 99%  LMP 12/25/2008       Objective:   Physical Exam  Constitutional: She is oriented to person, place, and time. She appears well-developed and well-nourished. No distress.  HENT:  Head: Normocephalic and atraumatic.  Cardiovascular: Normal rate and regular rhythm.   No murmur heard. Pulmonary/Chest: Effort normal and breath sounds normal. No respiratory distress. She has no wheezes. She has no rales. She exhibits no tenderness.  Neurological: She is alert and oriented to person, place, and time.  Psychiatric: She has a normal mood and affect. Her behavior is normal. Judgment and thought content normal.          Assessment & Plan:

## 2011-11-27 NOTE — Assessment & Plan Note (Addendum)
BP stable.  I have given her a 4 week supply of crestor 10mg  samples and bystolic samples.  She thinks she can afford one copay for exforge.  Continue current meds.

## 2011-12-04 ENCOUNTER — Telehealth: Payer: Self-pay | Admitting: *Deleted

## 2011-12-04 NOTE — Telephone Encounter (Signed)
Left detailed message on pt's voice mail notifying her  that her kidney function is stable. Sugar looks better than last visit A1C was 6.0 and now down to 5.6 which is in normal range. Calcium is up slightly. I would like her to repeat bmet in 1 month. Diagnosis is hypercalcemia.

## 2011-12-26 ENCOUNTER — Encounter: Payer: 59 | Admitting: Cardiology

## 2012-01-25 ENCOUNTER — Other Ambulatory Visit: Payer: Self-pay | Admitting: Family

## 2012-01-25 MED ORDER — NEBIVOLOL HCL 10 MG PO TABS: 10.0000 mg | ORAL_TABLET | Freq: Every day | ORAL | Status: DC

## 2012-01-25 MED ORDER — ROSUVASTATIN CALCIUM 10 MG PO TABS: 10.0000 mg | ORAL_TABLET | Freq: Every day | ORAL | Status: DC

## 2012-01-25 NOTE — Telephone Encounter (Signed)
No Exforge samples available, Rx sent to pharmacy.  Samples left at front desk for Crestor 10mg  #28 and Bystolic 5mg  (take 2 daily) #56. Pt notified.

## 2012-02-13 LAB — HM DIABETES EYE EXAM: HM Diabetic Eye Exam: NORMAL

## 2012-02-25 ENCOUNTER — Other Ambulatory Visit: Payer: Self-pay | Admitting: Family

## 2012-02-25 ENCOUNTER — Ambulatory Visit (INDEPENDENT_AMBULATORY_CARE_PROVIDER_SITE_OTHER): Payer: 59 | Admitting: Family

## 2012-02-25 ENCOUNTER — Encounter: Payer: Self-pay | Admitting: Family

## 2012-02-25 VITALS — BP 110/70 | HR 77 | Temp 98.5°F | Resp 16 | Ht 64.0 in | Wt 165.0 lb

## 2012-02-25 DIAGNOSIS — K589 Irritable bowel syndrome without diarrhea: Secondary | ICD-10-CM

## 2012-02-25 DIAGNOSIS — I10 Essential (primary) hypertension: Secondary | ICD-10-CM

## 2012-02-25 DIAGNOSIS — E785 Hyperlipidemia, unspecified: Secondary | ICD-10-CM

## 2012-02-25 DIAGNOSIS — E119 Type 2 diabetes mellitus without complications: Secondary | ICD-10-CM

## 2012-02-25 NOTE — Progress Notes (Signed)
Subjective:    Patient ID: Kaitlyn Harper, female    DOB: 03-Jun-1960, 52 y.o.   MRN: [id number]  HPI   Kaitlyn Harper is a 52 yr old female who presents today for follow up.  HTN- Denies CP or swelling.  She continues exforge and bystolic  DM2- She is mantained on metformin.  She denies polyuria or polydipsia. Denies symptomatic hypoglycemia.  IBS- reports that she has been having diarrhea. Cramping.   FMLA- requesting for mental health and IBS.    Review of Systems See HPI  Past Medical History  Diagnosis Date  . Hypertension   . Hyperlipidemia   . Hyperglycemia   . Anemia   . Anxiety   . Depression     History   Social History  . Marital Status: Divorced    Spouse Name: N/A    Number of Children: 3  . Years of Education: N/A   Occupational History  . RN SECRETARY    Social History Main Topics  . Smoking status: Former Smoker    Types: Cigarettes    Quit date: 02/19/2011  . Smokeless tobacco: Never Used     Comment: 10 cigarettes daily  . Alcohol Use: Yes     Comment: socially  . Drug Use: No  . Sexually Active: Yes    Birth Control/ Protection: Post-menopausal   Other Topics Concern  . Not on file   Social History Narrative   Caffeine use:  2 drinksRegular exercise: no    Past Surgical History  Procedure Date  . Cholecystectomy 1985  . Appendectomy 1985  . Tubal ligation 1985  . Knee surgery 1983    arthroscopy?  . Dilation and curettage of uterus 2005  . Polypectomy 2006    ?anal polyp?  [name] Hemorrhoid surgery 2002  . Tear duct probing 2004    Family History  Problem Relation Age of Onset  . Hypertension Father   . Sarcoidosis Brother   . Stroke Maternal Grandmother   . Alcohol abuse Brother   . Cirrhosis Brother     Allergies  Allergen Reactions  . Sulfonamide Derivatives Hives    Light sensitivity    Current Outpatient Prescriptions on File Prior to Visit  Medication Sig Dispense Refill  . ARIPiprazole (ABILIFY) 5 MG tablet  Take 5 mg by mouth daily.      [name] aspirin EC 81 MG tablet Take 81 mg by mouth daily.      [name] EXFORGE HCT 5-160-12.5 MG TABS TAKE 1 TABLET BY MOUTH ONCE DAILY  30 tablet  2  . LamoTRIgine (LAMICTAL ODT) 25 (21)-50 (7) MG KIT Take 100mg  in the morning and 100mg  at bedtime.      [name] loratadine (CLARITIN) 10 MG tablet Take 10 mg by mouth daily as needed.       . metFORMIN (GLUCOPHAGE) 500 MG tablet TAKE 1 TABLET (500 MG TOTAL) BY MOUTH 2 (TWO) TIMES DAILY WITH A MEAL.  60 tablet  2  . nebivolol (BYSTOLIC) 10 MG tablet Take 1 tablet (10 mg total) by mouth daily.  56 tablet  0  . rosuvastatin (CRESTOR) 10 MG tablet Take 1 tablet (10 mg total) by mouth daily.  28 tablet  0  . Vitamin D, Ergocalciferol, (DRISDOL) 50000 UNITS CAPS Take 1 capsule (50,000 Units total) by mouth every 7 (seven) days.  4 capsule  5    BP 110/70  Pulse 77  Temp 98.5 F (36.9 C) (Oral)  Resp 16  Ht 5\' 4"  (  1.626 m)  Wt 165 lb (74.844 kg)  BMI 28.32 kg/m2  SpO2 99%  LMP 12/25/2008       Objective:   Physical Exam  Constitutional: She is oriented to person, place, and time. She appears well-developed and well-nourished. No distress.  HENT:  Head: Normocephalic and atraumatic.  Cardiovascular: Normal rate and regular rhythm.   No murmur heard. Pulmonary/Chest: Effort normal and breath sounds normal. No respiratory distress. She has no wheezes. She has no rales. She exhibits no tenderness.  Musculoskeletal: She exhibits no edema.  Neurological: She is alert and oriented to person, place, and time.  Psychiatric: She has a normal mood and affect. Her behavior is normal. Judgment and thought content normal.          Assessment & Plan:  I have advised pt to obtain fmla paperwork from her employer and bring to our office.

## 2012-02-25 NOTE — Patient Instructions (Addendum)
Please return to the lab fasting one day this week.   Follow up in 3 months, sooner if problems/concerns.

## 2012-02-26 ENCOUNTER — Ambulatory Visit: Payer: 59 | Admitting: Licensed Clinical Social Worker

## 2012-02-28 ENCOUNTER — Ambulatory Visit (INDEPENDENT_AMBULATORY_CARE_PROVIDER_SITE_OTHER): Payer: 59 | Admitting: Licensed Clinical Social Worker

## 2012-02-28 DIAGNOSIS — F331 Major depressive disorder, recurrent, moderate: Secondary | ICD-10-CM

## 2012-02-28 DIAGNOSIS — F411 Generalized anxiety disorder: Secondary | ICD-10-CM

## 2012-02-28 NOTE — Assessment & Plan Note (Signed)
BP Readings from Last 3 Encounters:  02/25/12 110/70  11/27/11 124/84  08/28/11 118/74   Stable on exforge and bystolic. Continue same.

## 2012-02-28 NOTE — Assessment & Plan Note (Signed)
Clinically stable on metformin.  Continue same.  

## 2012-02-29 LAB — LIPID PANEL
HDL: 32 mg/dL — ABNORMAL LOW (ref >39)
LDL Cholesterol: 36 mg/dL (ref 0–99)
Triglycerides: 99 mg/dL (ref <150)
VLDL: 20 mg/dL (ref 0–40)

## 2012-02-29 LAB — HEPATIC FUNCTION PANEL
Albumin: 5 g/dL (ref 3.5–5.2)
Indirect Bilirubin: 0.2 mg/dL (ref 0.0–0.9)
Total Bilirubin: 0.3 mg/dL (ref 0.3–1.2)
Total Protein: 7.9 g/dL (ref 6.0–8.3)

## 2012-02-29 LAB — BASIC METABOLIC PANEL WITH GFR
BUN: 10 mg/dL (ref 6–23)
Calcium: 10.2 mg/dL (ref 8.4–10.5)
Chloride: 101 mEq/L (ref 96–112)
Creat: 1.06 mg/dL (ref 0.50–1.10)
GFR, Est African American: 69 mL/min
GFR, Est Non African American: 60 mL/min

## 2012-02-29 LAB — HEMOGLOBIN A1C: Hgb A1c MFr Bld: 6.1 % — ABNORMAL HIGH (ref <5.7)

## 2012-04-07 ENCOUNTER — Other Ambulatory Visit: Payer: Self-pay | Admitting: Family

## 2012-04-09 ENCOUNTER — Telehealth: Payer: Self-pay | Admitting: *Deleted

## 2012-04-09 NOTE — Telephone Encounter (Signed)
Received fax from [facility] pharmacy that crestor copay is increasing this year to $75/month. The following alternatives will be available to pt at no co-pay:  Simvastatin, atorvastatin, pravastatin and lovastatin. Please advise if one of these would be appropriate alternative.

## 2012-04-10 MED ORDER — ATORVASTATIN CALCIUM 20 MG PO TABS: 20.0000 mg | ORAL_TABLET | Freq: Every day | ORAL | Status: DC

## 2012-04-10 NOTE — Telephone Encounter (Signed)
Pt hasn't had any problems with atorvastatin in the past so advise pt we are changing Rx and pt is ok with it, med sent to pharmacy and discontinued crestor, pt already has f/u appt in April

## 2012-04-10 NOTE — Telephone Encounter (Signed)
Left voicemail requesting pt to call office 

## 2012-04-10 NOTE — Telephone Encounter (Signed)
Pls check with pt that she has not had problems taking atorvastatin in the past.  Then may send Atorvastatin 20mg  once daily #30 with 2 refills to her pharmacy. Plan follow up LFT and LFT in 3 months.

## 2012-05-07 ENCOUNTER — Other Ambulatory Visit: Payer: Self-pay | Admitting: Family

## 2012-05-07 NOTE — Telephone Encounter (Signed)
Rx request to pharmacy/SLS  

## 2012-05-07 NOTE — Telephone Encounter (Signed)
OK to send #4 with 3 refills.

## 2012-05-07 NOTE — Telephone Encounter (Signed)
Vitamin D, Ergocalciferol, (DRISDOL) 50000 Unit Caps requested [Last Rx 06.11.2013 #4x5]/SLS Please advise.

## 2012-05-26 ENCOUNTER — Ambulatory Visit: Payer: 59 | Admitting: Family

## 2012-05-27 ENCOUNTER — Ambulatory Visit (INDEPENDENT_AMBULATORY_CARE_PROVIDER_SITE_OTHER): Payer: 59 | Admitting: Licensed Clinical Social Worker

## 2012-05-27 DIAGNOSIS — F331 Major depressive disorder, recurrent, moderate: Secondary | ICD-10-CM

## 2012-05-27 DIAGNOSIS — F411 Generalized anxiety disorder: Secondary | ICD-10-CM

## 2012-05-28 ENCOUNTER — Encounter: Payer: Self-pay | Admitting: Family

## 2012-05-28 ENCOUNTER — Ambulatory Visit (INDEPENDENT_AMBULATORY_CARE_PROVIDER_SITE_OTHER): Payer: 59 | Admitting: Family

## 2012-05-28 VITALS — BP 122/82 | HR 70 | Temp 98.4°F | Resp 14 | Ht 64.0 in | Wt 166.1 lb

## 2012-05-28 DIAGNOSIS — I10 Essential (primary) hypertension: Secondary | ICD-10-CM

## 2012-05-28 DIAGNOSIS — K589 Irritable bowel syndrome without diarrhea: Secondary | ICD-10-CM

## 2012-05-28 DIAGNOSIS — E119 Type 2 diabetes mellitus without complications: Secondary | ICD-10-CM

## 2012-05-28 LAB — BASIC METABOLIC PANEL
Calcium: 10 mg/dL (ref 8.4–10.5)
Potassium: 4 mEq/L (ref 3.5–5.3)
Sodium: 139 mEq/L (ref 135–145)

## 2012-05-28 LAB — HEMOGLOBIN A1C: Mean Plasma Glucose: 117 mg/dL — ABNORMAL HIGH (ref <117)

## 2012-05-28 NOTE — Progress Notes (Signed)
Subjective:    Patient ID: Kaitlyn Harper, female    DOB: [birth year], 52 y.o.   MRN: [id number]  HPI  Ms.  Kaitlyn Harper is a 52 yr old female who presents today for follow up.  1) HTN- On Exforge-HCT and bystolic   2) Diabetes- Currently maintained on metformin. Tolerating without difficulty.   3) Anxiety/depression- reports well controlled, follows with Psychiatry.  4) IBS- improved.   Review of Systems See HPI  Past Medical History  Diagnosis Date  . Hypertension   . Hyperlipidemia   . Hyperglycemia   . Anemia   . Anxiety   . Depression     History   Social History  . Marital Status: Divorced    Spouse Name: N/A    Number of Children: 3  . Years of Education: N/A   Occupational History  . RN SECRETARY    Social History Main Topics  . Smoking status: Former Smoker    Types: Cigarettes    Quit date: 02/19/2011  . Smokeless tobacco: Never Used     Comment: 10 cigarettes daily  . Alcohol Use: Yes     Comment: socially  . Drug Use: No  . Sexually Active: Yes    Birth Control/ Protection: Post-menopausal   Other Topics Concern  . Not on file   Social History Narrative   Caffeine use:  2 drinks   Regular exercise: no          Past Surgical History  Procedure Laterality Date  . Cholecystectomy  1985  . Appendectomy  1985  . Tubal ligation  1985  . Knee surgery  1983    arthroscopy?  . Dilation and curettage of uterus  2005  . Polypectomy  2006    ?anal polyp?  [name] Hemorrhoid surgery  2002  . Tear duct probing  2004    Family History  Problem Relation Age of Onset  . Hypertension Father   . Sarcoidosis Brother   . Stroke Maternal Grandmother   . Alcohol abuse Brother   . Cirrhosis Brother     Allergies  Allergen Reactions  . Sulfonamide Derivatives Hives    Light sensitivity    Current Outpatient Prescriptions on File Prior to Visit  Medication Sig Dispense Refill  . ARIPiprazole (ABILIFY) 5 MG tablet Take 5 mg by mouth daily.      [name]  aspirin EC 81 MG tablet Take 81 mg by mouth daily.      [name] atorvastatin (LIPITOR) 20 MG tablet Take 1 tablet (20 mg total) by mouth daily.  30 tablet  2  . BYSTOLIC 10 MG tablet TAKE 1 TABLET (10 MG TOTAL) BY MOUTH DAILY.  30 tablet  3  . EXFORGE HCT 5-160-12.5 MG TABS TAKE 1 TABLET BY MOUTH ONCE DAILY  30 tablet  2  . loratadine (CLARITIN) 10 MG tablet Take 10 mg by mouth daily as needed.       . metFORMIN (GLUCOPHAGE) 500 MG tablet TAKE 1 TABLET (500 MG TOTAL) BY MOUTH 2 (TWO) TIMES DAILY WITH A MEAL.  60 tablet  2  . nebivolol (BYSTOLIC) 10 MG tablet Take 1 tablet (10 mg total) by mouth daily.  56 tablet  0  . Vitamin D, Ergocalciferol, (DRISDOL) 50000 UNITS CAPS TAKE 1 CAPSULE (50,000 UNITS TOTAL) BY MOUTH EVERY 7 (SEVEN) DAYS.  4 capsule  5  . LamoTRIgine (LAMICTAL ODT) 25 (21)-50 (7) MG KIT Take 100mg  in the morning and 150mg  at bedtime.  No current facility-administered medications on file prior to visit.    BP 122/82  Pulse 70  Temp(Src) 98.4 F (36.9 C) (Oral)  Resp 14  Ht 5\' 4"  (1.626 m)  Wt 166 lb 1.3 oz (75.333 kg)  BMI 28.49 kg/m2  SpO2 99%  LMP 12/25/2008       Objective:   Physical Exam  Constitutional: She is oriented to person, place, and time. She appears well-developed and well-nourished. No distress.  HENT:  Head: Normocephalic and atraumatic.  Cardiovascular: Normal rate and regular rhythm.   No murmur heard. Pulmonary/Chest: Effort normal and breath sounds normal. No respiratory distress. She has no wheezes. She has no rales. She exhibits no tenderness.  Musculoskeletal: She exhibits no edema.  Lymphadenopathy:    She has no cervical adenopathy.  Neurological: She is alert and oriented to person, place, and time.  Psychiatric: She has a normal mood and affect. Her behavior is normal. Judgment and thought content normal.          Assessment & Plan:

## 2012-05-28 NOTE — Patient Instructions (Addendum)
Please complete lab work prior to leaving. Follow up in 3 months.  

## 2012-06-02 NOTE — Assessment & Plan Note (Addendum)
BP Readings from Last 3 Encounters:  05/28/12 122/82  02/25/12 110/70  11/27/11 124/84   BP stable on atorvastain, bystolic and exforge.  Check A1C.

## 2012-06-02 NOTE — Assessment & Plan Note (Signed)
Improved

## 2012-06-02 NOTE — Assessment & Plan Note (Signed)
Clinically stable on metformin. Check A1C.

## 2012-07-14 ENCOUNTER — Other Ambulatory Visit: Payer: Self-pay | Admitting: Family

## 2012-08-26 ENCOUNTER — Ambulatory Visit (INDEPENDENT_AMBULATORY_CARE_PROVIDER_SITE_OTHER): Payer: 59 | Admitting: Family

## 2012-08-26 ENCOUNTER — Encounter: Payer: Self-pay | Admitting: Family

## 2012-08-26 VITALS — BP 120/86 | HR 79 | Temp 98.9°F | Resp 16 | Ht 64.0 in | Wt 168.1 lb

## 2012-08-26 DIAGNOSIS — E119 Type 2 diabetes mellitus without complications: Secondary | ICD-10-CM

## 2012-08-26 DIAGNOSIS — F32A Depression, unspecified: Secondary | ICD-10-CM

## 2012-08-26 DIAGNOSIS — E785 Hyperlipidemia, unspecified: Secondary | ICD-10-CM

## 2012-08-26 DIAGNOSIS — I10 Essential (primary) hypertension: Secondary | ICD-10-CM

## 2012-08-26 DIAGNOSIS — F419 Anxiety disorder, unspecified: Secondary | ICD-10-CM

## 2012-08-26 DIAGNOSIS — F341 Dysthymic disorder: Secondary | ICD-10-CM

## 2012-08-26 NOTE — Assessment & Plan Note (Signed)
Check LFT, continue lipitor. LDL at goal last visit.

## 2012-08-26 NOTE — Assessment & Plan Note (Signed)
Currently stable on lamictal, abilify- defer management to psych.

## 2012-08-26 NOTE — Assessment & Plan Note (Signed)
BP Readings from Last 3 Encounters:  08/26/12 120/86  05/28/12 122/82  02/25/12 110/70   Currently stable on current meds.  Monitor.

## 2012-08-26 NOTE — Progress Notes (Signed)
Subjective:    Patient ID: Kaitlyn Harper, female    DOB: [birth year], 52 y.o.   MRN: [id number]  HPI  Ms.  Whan is a 52 yr old female who presents today for follow up.  1) HTN- continues exforge.  2) DM2- does not check sugars at home.    3) Depression- follows with psychiatry and reports that this is well controlled on abilify.   Review of Systems    see HPI Past Medical History  Diagnosis Date  . Hypertension   . Hyperlipidemia   . Hyperglycemia   . Anemia   . Anxiety   . Depression     History   Social History  . Marital Status: Divorced    Spouse Name: N/A    Number of Children: 3  . Years of Education: N/A   Occupational History  . RN SECRETARY    Social History Main Topics  . Smoking status: Former Smoker    Types: Cigarettes    Quit date: 02/19/2011  . Smokeless tobacco: Never Used     Comment: 10 cigarettes daily  . Alcohol Use: Yes     Comment: socially  . Drug Use: No  . Sexually Active: Yes    Birth Control/ Protection: Post-menopausal   Other Topics Concern  . Not on file   Social History Narrative   Caffeine use:  2 drinks   Regular exercise: no          Past Surgical History  Procedure Laterality Date  . Cholecystectomy  1985  . Appendectomy  1985  . Tubal ligation  1985  . Knee surgery  1983    arthroscopy?  . Dilation and curettage of uterus  2005  . Polypectomy  2006    ?anal polyp?  [name] Hemorrhoid surgery  2002  . Tear duct probing  2004    Family History  Problem Relation Age of Onset  . Hypertension Father   . Sarcoidosis Brother   . Stroke Maternal Grandmother   . Alcohol abuse Brother   . Cirrhosis Brother     Allergies  Allergen Reactions  . Sulfonamide Derivatives Hives    Light sensitivity    Current Outpatient Prescriptions on File Prior to Visit  Medication Sig Dispense Refill  . ARIPiprazole (ABILIFY) 5 MG tablet Take 5 mg by mouth daily.      [name] aspirin EC 81 MG tablet Take 81 mg by mouth daily.       [name] atorvastatin (LIPITOR) 20 MG tablet Take 1 tablet (20 mg total) by mouth daily.  30 tablet  2  . EXFORGE HCT 5-160-12.5 MG TABS TAKE 1 TABLET BY MOUTH ONCE DAILY  30 tablet  2  . LamoTRIgine (LAMICTAL ODT) 25 (21)-50 (7) MG KIT Take 100mg  in the morning and 150mg  at bedtime.      [name] loratadine (CLARITIN) 10 MG tablet Take 10 mg by mouth daily as needed.       . metFORMIN (GLUCOPHAGE) 500 MG tablet Take 1 tablet (500 mg total) by mouth 2 (two) times daily with a meal.  60 tablet  2  . nebivolol (BYSTOLIC) 10 MG tablet Take 1 tablet (10 mg total) by mouth daily.  56 tablet  0  . nebivolol (BYSTOLIC) 10 MG tablet Take 1 tablet (10 mg total) by mouth daily.  30 tablet  3  . Vitamin D, Ergocalciferol, (DRISDOL) 50000 UNITS CAPS TAKE 1 CAPSULE (50,000 UNITS TOTAL) BY MOUTH EVERY 7 (SEVEN) DAYS.  4 capsule  5   No current facility-administered medications on file prior to visit.    BP 120/86  Pulse 79  Temp(Src) 98.9 F (37.2 C) (Oral)  Resp 16  Ht 5\' 4"  (1.626 m)  Wt 168 lb 1.9 oz (76.259 kg)  BMI 28.84 kg/m2  SpO2 99%  LMP 12/25/2008    Objective:   Physical Exam  Constitutional: She is oriented to person, place, and time. She appears well-developed and well-nourished. No distress.  HENT:  Head: Normocephalic and atraumatic.  Cardiovascular: Normal rate and regular rhythm.   No murmur heard. Pulmonary/Chest: Effort normal and breath sounds normal. No respiratory distress. She has no wheezes. She has no rales. She exhibits no tenderness.  Musculoskeletal: She exhibits no edema.  Neurological: She is alert and oriented to person, place, and time.  Psychiatric: She has a normal mood and affect. Her behavior is normal. Judgment and thought content normal.          Assessment & Plan:

## 2012-08-26 NOTE — Assessment & Plan Note (Signed)
Check urine microalbumin and A1C.  Continue metformin.

## 2012-08-26 NOTE — Patient Instructions (Addendum)
Please complete your lab work prior to leaving. Follow up in 3 months.   

## 2012-08-27 LAB — HEPATIC FUNCTION PANEL
AST: 16 U/L (ref 0–37)
Albumin: 4.9 g/dL (ref 3.5–5.2)
Total Bilirubin: 0.4 mg/dL (ref 0.3–1.2)

## 2012-08-27 LAB — MICROALBUMIN / CREATININE URINE RATIO: Microalb, Ur: 0.5 mg/dL (ref 0.00–1.89)

## 2012-08-28 ENCOUNTER — Encounter: Payer: Self-pay | Admitting: Family

## 2012-08-29 ENCOUNTER — Other Ambulatory Visit: Payer: Self-pay | Admitting: Family

## 2012-09-23 ENCOUNTER — Ambulatory Visit (INDEPENDENT_AMBULATORY_CARE_PROVIDER_SITE_OTHER): Payer: 59 | Admitting: Licensed Clinical Social Worker

## 2012-09-23 DIAGNOSIS — F411 Generalized anxiety disorder: Secondary | ICD-10-CM

## 2012-09-23 DIAGNOSIS — F331 Major depressive disorder, recurrent, moderate: Secondary | ICD-10-CM

## 2012-10-16 ENCOUNTER — Other Ambulatory Visit: Payer: Self-pay | Admitting: Family

## 2012-10-16 NOTE — Telephone Encounter (Signed)
Last vitamin D check in EPIC was 2012. Should pt still be taking Rx strength of Vit d?

## 2012-11-25 ENCOUNTER — Encounter: Payer: Self-pay | Admitting: Family

## 2012-11-25 ENCOUNTER — Ambulatory Visit (INDEPENDENT_AMBULATORY_CARE_PROVIDER_SITE_OTHER): Payer: 59 | Admitting: Family

## 2012-11-25 VITALS — BP 110/80 | HR 77 | Temp 98.8°F | Resp 16 | Ht 64.0 in | Wt 165.0 lb

## 2012-11-25 DIAGNOSIS — E119 Type 2 diabetes mellitus without complications: Secondary | ICD-10-CM

## 2012-11-25 DIAGNOSIS — E785 Hyperlipidemia, unspecified: Secondary | ICD-10-CM

## 2012-11-25 DIAGNOSIS — F419 Anxiety disorder, unspecified: Secondary | ICD-10-CM

## 2012-11-25 DIAGNOSIS — I10 Essential (primary) hypertension: Secondary | ICD-10-CM

## 2012-11-25 DIAGNOSIS — F341 Dysthymic disorder: Secondary | ICD-10-CM

## 2012-11-25 DIAGNOSIS — F329 Major depressive disorder, single episode, unspecified: Secondary | ICD-10-CM

## 2012-11-25 DIAGNOSIS — F32A Depression, unspecified: Secondary | ICD-10-CM

## 2012-11-25 LAB — BASIC METABOLIC PANEL
BUN: 8 mg/dL (ref 6–23)
CO2: 30 mEq/L (ref 19–32)
Chloride: 102 mEq/L (ref 96–112)
Glucose, Bld: 100 mg/dL — ABNORMAL HIGH (ref 70–99)
Potassium: 3.5 mEq/L (ref 3.5–5.3)

## 2012-11-25 LAB — HEPATIC FUNCTION PANEL
ALT: 12 U/L (ref 0–35)
AST: 15 U/L (ref 0–37)
Alkaline Phosphatase: 79 U/L (ref 39–117)
Bilirubin, Direct: 0.1 mg/dL (ref 0.0–0.3)
Indirect Bilirubin: 0.3 mg/dL (ref 0.0–0.9)
Total Bilirubin: 0.4 mg/dL (ref 0.3–1.2)
Total Protein: 7.7 g/dL (ref 6.0–8.3)

## 2012-11-25 NOTE — Progress Notes (Signed)
Subjective:    Patient ID: Kaitlyn Harper, female    DOB: 1960-03-08, 52 y.o.   MRN: [id number]  HPI  Kaitlyn Harper is a 52 yr old female who presents today for follow up of multiple medical problems.  1) HTN- blood pressure stable on exforge and bytolic.  Denies cp, sob or swelling.    2) DM2- she is not checking sugars regularly will get flu shot a work, continues metformin.    3) Depression/anxiety- reports symptoms are well controlled. She follows with psychiatry- Rosebud Poles NP.  4) Hyperlipidemia- continues lipitor. Denies mylagia.      Review of Systems See HPI  Past Medical History  Diagnosis Date  . Hypertension   . Hyperlipidemia   . Hyperglycemia   . Anemia   . Anxiety   . Depression     History   Social History  . Marital Status: Divorced    Spouse Name: N/A    Number of Children: 3  . Years of Education: N/A   Occupational History  . RN SECRETARY    Social History Main Topics  . Smoking status: Former Smoker    Types: Cigarettes    Quit date: 02/19/2011  . Smokeless tobacco: Never Used     Comment: 10 cigarettes daily  . Alcohol Use: Yes     Comment: socially  . Drug Use: No  . Sexual Activity: Yes    Birth Control/ Protection: Post-menopausal   Other Topics Concern  . Not on file   Social History Narrative   Caffeine use:  2 drinks   Regular exercise: no          Past Surgical History  Procedure Laterality Date  . Cholecystectomy  1985  . Appendectomy  1985  . Tubal ligation  1985  . Knee surgery  1983    arthroscopy?  . Dilation and curettage of uterus  2005  . Polypectomy  2006    ?anal polyp?  [name] Hemorrhoid surgery  2002  . Tear duct probing  2004    Family History  Problem Relation Age of Onset  . Hypertension Father   . Sarcoidosis Brother   . Stroke Maternal Grandmother   . Alcohol abuse Brother   . Cirrhosis Brother     Allergies  Allergen Reactions  . Sulfonamide Derivatives Hives    Light sensitivity     Current Outpatient Prescriptions on File Prior to Visit  Medication Sig Dispense Refill  . ARIPiprazole (ABILIFY) 5 MG tablet Take 5 mg by mouth daily.      [name] aspirin EC 81 MG tablet Take 81 mg by mouth daily.      [name] atorvastatin (LIPITOR) 20 MG tablet TAKE 1 TABLET (20 MG TOTAL) BY MOUTH DAILY.  30 tablet  2  . EXFORGE HCT 5-160-12.5 MG TABS TAKE 1 TABLET BY MOUTH ONCE DAILY  30 tablet  2  . LamoTRIgine (LAMICTAL ODT) 25 (21)-50 (7) MG KIT Take 100mg  in the morning and 150mg  at bedtime.      [name] loratadine (CLARITIN) 10 MG tablet Take 10 mg by mouth daily as needed.       . metFORMIN (GLUCOPHAGE) 500 MG tablet TAKE 1 TABLET (500 MG TOTAL) BY MOUTH 2 (TWO) TIMES DAILY WITH A MEAL.  60 tablet  2  . nebivolol (BYSTOLIC) 10 MG tablet Take 1 tablet (10 mg total) by mouth daily.  30 tablet  3  . Vitamin D, Ergocalciferol, (DRISDOL) 50000 UNITS CAPS capsule TAKE 1 CAPSULE (50,000  UNITS TOTAL) BY MOUTH EVERY 7 (SEVEN) DAYS.  4 capsule  5   No current facility-administered medications on file prior to visit.    BP 110/80  Pulse 77  Temp(Src) 98.8 F (37.1 C) (Oral)  Resp 16  Ht 5\' 4"  (1.626 m)  Wt 165 lb (74.844 kg)  BMI 28.31 kg/m2  SpO2 99%  LMP 12/25/2008       Objective:   Physical Exam  Constitutional: She is oriented to person, place, and time. She appears well-developed and well-nourished. No distress.  HENT:  Head: Normocephalic and atraumatic.  Cardiovascular: Normal rate and regular rhythm.   No murmur heard. Pulmonary/Chest: Effort normal and breath sounds normal. No respiratory distress. She has no wheezes. She has no rales. She exhibits no tenderness.  Musculoskeletal: She exhibits no edema.  Lymphadenopathy:    She has no cervical adenopathy.  Neurological: She is alert and oriented to person, place, and time.  Skin: Skin is warm and dry.  Psychiatric: She has a normal mood and affect. Her behavior is normal. Judgment and thought content normal.           Assessment & Plan:

## 2012-11-25 NOTE — Patient Instructions (Signed)
Please complete your lab work prior to leaving. Follow up in 3 months, sooner if problem/concerns.

## 2012-11-27 ENCOUNTER — Encounter: Payer: Self-pay | Admitting: Family

## 2012-11-28 NOTE — Assessment & Plan Note (Signed)
Tolerating statin, continue same. Obtain lft.

## 2012-11-28 NOTE — Assessment & Plan Note (Signed)
Clinically stable. Obtain A1c, bmet, pt to get flu shot from employer.

## 2012-11-28 NOTE — Assessment & Plan Note (Signed)
BP stable on current meds. Continue same. Obtain bmet 

## 2012-11-28 NOTE — Assessment & Plan Note (Signed)
Stable, defer management to psychiatry. 

## 2012-12-08 ENCOUNTER — Other Ambulatory Visit: Payer: Self-pay | Admitting: Family

## 2013-01-14 ENCOUNTER — Other Ambulatory Visit: Payer: Self-pay | Admitting: Family

## 2013-01-14 NOTE — Telephone Encounter (Signed)
Rxs sent on 12/08/12, #30 x 3 refills for exforge and bystolic.  Denial sent to pharmacy to use refills on file.

## 2013-01-23 ENCOUNTER — Other Ambulatory Visit: Payer: Self-pay | Admitting: Family

## 2013-02-12 DIAGNOSIS — R7302 Impaired glucose tolerance (oral): Secondary | ICD-10-CM | POA: Diagnosis present

## 2013-02-16 ENCOUNTER — Other Ambulatory Visit: Payer: Self-pay | Admitting: Family

## 2013-02-16 NOTE — Telephone Encounter (Signed)
Rx request DENIED-Too Soon for refill request/SLS Last Rx Confirmed by pharmacy receipt: [place] [phone], #30x3; Metformin 12.12.14, #60x1

## 2013-02-24 ENCOUNTER — Ambulatory Visit (INDEPENDENT_AMBULATORY_CARE_PROVIDER_SITE_OTHER): Payer: 59 | Admitting: Family

## 2013-02-24 ENCOUNTER — Encounter: Payer: Self-pay | Admitting: Family

## 2013-02-24 VITALS — BP 110/80 | HR 74 | Temp 98.2°F | Resp 16 | Ht 64.0 in | Wt 162.1 lb

## 2013-02-24 DIAGNOSIS — F329 Major depressive disorder, single episode, unspecified: Secondary | ICD-10-CM

## 2013-02-24 DIAGNOSIS — F419 Anxiety disorder, unspecified: Secondary | ICD-10-CM

## 2013-02-24 DIAGNOSIS — I10 Essential (primary) hypertension: Secondary | ICD-10-CM

## 2013-02-24 DIAGNOSIS — F341 Dysthymic disorder: Secondary | ICD-10-CM

## 2013-02-24 DIAGNOSIS — E785 Hyperlipidemia, unspecified: Secondary | ICD-10-CM

## 2013-02-24 DIAGNOSIS — F32A Depression, unspecified: Secondary | ICD-10-CM

## 2013-02-24 DIAGNOSIS — E119 Type 2 diabetes mellitus without complications: Secondary | ICD-10-CM

## 2013-02-24 LAB — BASIC METABOLIC PANEL
BUN: 10 mg/dL (ref 6–23)
CO2: 30 meq/L (ref 19–32)
Calcium: 10.1 mg/dL (ref 8.4–10.5)
Chloride: 101 mEq/L (ref 96–112)
Creat: 1.05 mg/dL (ref 0.50–1.10)
Glucose, Bld: 103 mg/dL — ABNORMAL HIGH (ref 70–99)
POTASSIUM: 3.8 meq/L (ref 3.5–5.3)
SODIUM: 139 meq/L (ref 135–145)

## 2013-02-24 LAB — HEMOGLOBIN A1C
HEMOGLOBIN A1C: 5.9 % — AB (ref <5.7)
MEAN PLASMA GLUCOSE: 123 mg/dL — AB (ref <117)

## 2013-02-24 LAB — LIPID PANEL
CHOLESTEROL: 96 mg/dL (ref 0–200)
HDL: 30 mg/dL — ABNORMAL LOW (ref >39)
LDL Cholesterol: 50 mg/dL (ref 0–99)
TRIGLYCERIDES: 81 mg/dL (ref <150)
Total CHOL/HDL Ratio: 3.2 Ratio
VLDL: 16 mg/dL (ref 0–40)

## 2013-02-24 NOTE — Assessment & Plan Note (Signed)
Stable on current meds, defer management to psychiatry.

## 2013-02-24 NOTE — Progress Notes (Signed)
Pre visit review using our clinic review tool, if applicable. No additional management support is needed unless otherwise documented below in the visit note. 

## 2013-02-24 NOTE — Progress Notes (Signed)
Subjective:    Patient ID: Kaitlyn Harper, female    DOB: 09-02-1960, 53 y.o.   MRN: [id number]  HPI  Kaitlyn Harper is a 53 yr old female who presents today for follow up of multiple medical problems.  1) HTN-currently maintained on exforge hct and bystolic.   She denies CP/SOB or swelling.  BP Readings from Last 3 Encounters:  02/24/13 110/80  11/25/12 110/80  08/26/12 120/86   2) DM2- she is maintained on metformin.  Eye exam is up to date. Last A1C was 5.8.  Denies polyuria or polydipsia.   3) Depression/anxiety- maintained on abilify, lamotrigine.  Reports well controlled on current meds. Follows with Minette Brine NP.    4) Hyperlipidemia- Last LDL was 1 year ago and was at goal.  She is maintained on lipitor.     Review of Systems See HPI  Past Medical History  Diagnosis Date  . Hypertension   . Hyperlipidemia   . Hyperglycemia   . Anemia   . Anxiety   . Depression     History   Social History  . Marital Status: Divorced    Spouse Name: N/A    Number of Children: 3  . Years of Education: N/A   Occupational History  . RN SECRETARY    Social History Main Topics  . Smoking status: Former Smoker    Types: Cigarettes    Quit date: 02/19/2011  . Smokeless tobacco: Never Used     Comment: 10 cigarettes daily  . Alcohol Use: Yes     Comment: socially  . Drug Use: No  . Sexual Activity: Yes    Birth Control/ Protection: Post-menopausal   Other Topics Concern  . Not on file   Social History Narrative   Caffeine use:  2 drinks   Regular exercise: no          Past Surgical History  Procedure Laterality Date  . Cholecystectomy  1985  . Appendectomy  1985  . Tubal ligation  1985  . Knee surgery  1983    arthroscopy?  . Dilation and curettage of uterus  2005  . Polypectomy  2006    ?anal polyp?  [name] Hemorrhoid surgery  2002  . Tear duct probing  2004    Family History  Problem Relation Age of Onset  . Hypertension Father   . Sarcoidosis  Brother   . Stroke Maternal Grandmother   . Alcohol abuse Brother   . Cirrhosis Brother     Allergies  Allergen Reactions  . Sulfonamide Derivatives Hives    Light sensitivity    Current Outpatient Prescriptions on File Prior to Visit  Medication Sig Dispense Refill  . ARIPiprazole (ABILIFY) 5 MG tablet Take 5 mg by mouth daily.      [name] aspirin EC 81 MG tablet Take 81 mg by mouth daily.      [name] atorvastatin (LIPITOR) 20 MG tablet TAKE 1 TABLET BY MOUTH DAILY.  30 tablet  3  . BYSTOLIC 10 MG tablet TAKE 1 TABLET (10 MG TOTAL) BY MOUTH DAILY.  30 tablet  3  . EXFORGE HCT 5-160-12.5 MG TABS TAKE 1 TABLET BY MOUTH ONCE DAILY  30 tablet  3  . LamoTRIgine (LAMICTAL ODT) 25 (21)-50 (7) MG KIT Take 154m in the morning and 1540mat bedtime.      . Marland Kitchenoratadine (CLARITIN) 10 MG tablet Take 10 mg by mouth daily as needed.       . metFORMIN (GLUCOPHAGE) 500 MG  tablet TAKE 1 TABLET (500 MG TOTAL) BY MOUTH 2 (TWO) TIMES DAILY WITH A MEAL.  60 tablet  1  . Vitamin D, Ergocalciferol, (DRISDOL) 50000 UNITS CAPS capsule TAKE 1 CAPSULE (50,000 UNITS TOTAL) BY MOUTH EVERY 7 (SEVEN) DAYS.  4 capsule  5   No current facility-administered medications on file prior to visit.    BP 110/80  Pulse 74  Temp(Src) 98.2 F (36.8 C) (Oral)  Resp 16  Ht _0  (1.626 m)  Wt 162 lb 1.3 oz (73.519 kg)  BMI 27.81 kg/m2  SpO2 99%  LMP 12/25/2008       Objective:   Physical Exam  Constitutional: She is oriented to person, place, and time. She appears well-developed and well-nourished. No distress.  Cardiovascular: Normal rate and regular rhythm.   No murmur heard. Pulmonary/Chest: Effort normal and breath sounds normal. No respiratory distress. She has no wheezes. She has no rales. She exhibits no tenderness.  Neurological: She is alert and oriented to person, place, and time.  Psychiatric: She has a normal mood and affect. Her behavior is normal. Judgment and thought content normal.          Assessment  & Plan:

## 2013-02-24 NOTE — Assessment & Plan Note (Signed)
Clinically stable on metformin- Continue same. Obtain A1C.

## 2013-02-24 NOTE — Patient Instructions (Signed)
Please complete your lab work prior to leaving. Please schedule a follow up appointment in 3 months.

## 2013-02-24 NOTE — Assessment & Plan Note (Signed)
BP is stable on current meds. Continue same.  Obtain bmet.  

## 2013-02-25 ENCOUNTER — Encounter: Payer: Self-pay | Admitting: Family

## 2013-02-25 ENCOUNTER — Telehealth: Payer: Self-pay

## 2013-02-25 NOTE — Telephone Encounter (Signed)
Relevant patient education assigned to patient using Emmi. ° °

## 2013-03-17 ENCOUNTER — Telehealth: Payer: Self-pay | Admitting: Family

## 2013-03-17 NOTE — Telephone Encounter (Signed)
Relevant patient education mailed to patient.  

## 2013-04-02 ENCOUNTER — Other Ambulatory Visit: Payer: Self-pay | Admitting: Family

## 2013-04-23 ENCOUNTER — Other Ambulatory Visit: Payer: Self-pay | Admitting: Family

## 2013-05-26 ENCOUNTER — Ambulatory Visit (INDEPENDENT_AMBULATORY_CARE_PROVIDER_SITE_OTHER): Payer: 59 | Admitting: Family

## 2013-05-26 ENCOUNTER — Encounter: Payer: Self-pay | Admitting: Family

## 2013-05-26 ENCOUNTER — Other Ambulatory Visit: Payer: Self-pay | Admitting: Family

## 2013-05-26 VITALS — BP 118/80 | HR 69 | Temp 98.0°F | Resp 16 | Ht 64.0 in | Wt 168.0 lb

## 2013-05-26 DIAGNOSIS — E785 Hyperlipidemia, unspecified: Secondary | ICD-10-CM

## 2013-05-26 DIAGNOSIS — E119 Type 2 diabetes mellitus without complications: Secondary | ICD-10-CM

## 2013-05-26 DIAGNOSIS — R21 Rash and other nonspecific skin eruption: Secondary | ICD-10-CM | POA: Insufficient documentation

## 2013-05-26 DIAGNOSIS — I10 Essential (primary) hypertension: Secondary | ICD-10-CM

## 2013-05-26 LAB — HEPATIC FUNCTION PANEL
ALT: 18 U/L (ref 0–35)
AST: 19 U/L (ref 0–37)
Albumin: 4.4 g/dL (ref 3.5–5.2)
Alkaline Phosphatase: 83 U/L (ref 39–117)
Bilirubin, Direct: 0.1 mg/dL (ref 0.0–0.3)
Indirect Bilirubin: 0.3 mg/dL (ref 0.2–1.2)
Total Bilirubin: 0.4 mg/dL (ref 0.2–1.2)
Total Protein: 7.4 g/dL (ref 6.0–8.3)

## 2013-05-26 LAB — BASIC METABOLIC PANEL
BUN: 7 mg/dL (ref 6–23)
CALCIUM: 9.7 mg/dL (ref 8.4–10.5)
CHLORIDE: 103 meq/L (ref 96–112)
CO2: 30 meq/L (ref 19–32)
Creat: 1.02 mg/dL (ref 0.50–1.10)
GLUCOSE: 135 mg/dL — AB (ref 70–99)
Potassium: 4 mEq/L (ref 3.5–5.3)
Sodium: 141 mEq/L (ref 135–145)

## 2013-05-26 LAB — HEMOGLOBIN A1C
Hgb A1c MFr Bld: 6 % — ABNORMAL HIGH
Mean Plasma Glucose: 126 mg/dL — ABNORMAL HIGH

## 2013-05-26 NOTE — Assessment & Plan Note (Signed)
Stable on current meds.  Continue same. 

## 2013-05-26 NOTE — Assessment & Plan Note (Signed)
LDL at goal on lipitor. Continue same, obtain LFT.

## 2013-05-26 NOTE — Progress Notes (Signed)
Subjective:    Patient ID: Kaitlyn Harper, female    DOB: Feb 07, 1961, 53 y.o.   MRN: [id number]  HPI  Kaitlyn Harper is a 53 yr old female who presents today for follow up of multiple medical problems.  1) DM2- eye exam is up to date. She is on ASA and an ARB.  Pneumovax up to date.  On metformin.  Last A1C was at 5.9.  Denies symptomatic hypoglycemia.   2) HTN- current bp meds include bystolic and exforge hct.  Denies CP/SOB or swelling.  BP Readings from Last 3 Encounters:  05/26/13 118/80  02/24/13 110/80  11/25/12 110/80   3) Hyperlipidemia- currently maintained on atorvastatin.  LDL at goal. Lab Results  Component Value Date   CHOL 96 02/24/2013   HDL 30* 02/24/2013   LDLCALC 50 02/24/2013   TRIG 81 02/24/2013   CHOLHDL 3.2 02/24/2013   4) Rash-reports rash left axilla x 3 week, not itching.    Reports depression remains well controlled.   Review of Systems See HPI  Past Medical History  Diagnosis Date  . Hypertension   . Hyperlipidemia   . Hyperglycemia   . Anemia   . Anxiety   . Depression     History   Social History  . Marital Status: Divorced    Spouse Name: N/A    Number of Children: 3  . Years of Education: N/A   Occupational History  . RN SECRETARY    Social History Main Topics  . Smoking status: Former Smoker    Types: Cigarettes    Quit date: 02/19/2011  . Smokeless tobacco: Never Used     Comment: 10 cigarettes daily  . Alcohol Use: Yes     Comment: socially  . Drug Use: No  . Sexual Activity: Yes    Birth Control/ Protection: Post-menopausal   Other Topics Concern  . Not on file   Social History Narrative   Caffeine use:  2 drinks   Regular exercise: no          Past Surgical History  Procedure Laterality Date  . Cholecystectomy  1985  . Appendectomy  1985  . Tubal ligation  1985  . Knee surgery  1983    arthroscopy?  . Dilation and curettage of uterus  2005  . Polypectomy  2006    ?anal polyp?  [name] Hemorrhoid surgery   2002  . Tear duct probing  2004    Family History  Problem Relation Age of Onset  . Hypertension Father   . Sarcoidosis Brother   . Stroke Maternal Grandmother   . Alcohol abuse Brother   . Cirrhosis Brother     Allergies  Allergen Reactions  . Sulfonamide Derivatives Hives    Light sensitivity    Current Outpatient Prescriptions on File Prior to Visit  Medication Sig Dispense Refill  . ARIPiprazole (ABILIFY) 5 MG tablet Take 5 mg by mouth daily.      [name] aspirin EC 81 MG tablet Take 81 mg by mouth daily.      [name] atorvastatin (LIPITOR) 20 MG tablet TAKE 1 TABLET BY MOUTH DAILY.  30 tablet  3  . BYSTOLIC 10 MG tablet TAKE 1 TABLET (10 MG TOTAL) BY MOUTH DAILY.  30 tablet  3  . EXFORGE HCT 5-160-12.5 MG TABS TAKE 1 TABLET BY MOUTH ONCE DAILY  30 tablet  3  . LamoTRIgine (LAMICTAL ODT) 25 (21)-50 (7) MG KIT Take 146m in the morning and 1599mat bedtime.      .[name]  loratadine (CLARITIN) 10 MG tablet Take 10 mg by mouth daily as needed.       . metFORMIN (GLUCOPHAGE) 500 MG tablet TAKE 1 TABLET (500 MG TOTAL) BY MOUTH 2 (TWO) TIMES DAILY WITH A MEAL.  60 tablet  5  . Vitamin D, Ergocalciferol, (DRISDOL) 50000 UNITS CAPS capsule TAKE 1 CAPSULE (50,000 UNITS TOTAL) BY MOUTH EVERY 7 (SEVEN) DAYS.  4 capsule  5   No current facility-administered medications on file prior to visit.    BP 118/80  Pulse 69  Temp(Src) 98 F (36.7 C) (Oral)  Resp 16  Ht _0  (1.626 m)  Wt 168 lb (76.204 kg)  BMI 28.82 kg/m2  SpO2 99%  LMP 12/25/2008       Objective:   Physical Exam  Constitutional: She is oriented to person, place, and time. She appears well-developed and well-nourished. No distress.  Cardiovascular: Normal rate and regular rhythm.   No murmur heard. Pulmonary/Chest: Effort normal and breath sounds normal. No respiratory distress. She has no wheezes. She has no rales. She exhibits no tenderness.  Neurological: She is alert and oriented to person, place, and time.  Skin: Skin is  warm and dry.  hyperpigmented rash noted left axilla  Psychiatric: She has a normal mood and affect. Her behavior is normal. Judgment and thought content normal.          Assessment & Plan:

## 2013-05-26 NOTE — Patient Instructions (Signed)
Apply lotrimin cream to left underarm twice daily. Complete lab work prior to leaving. Please schedule a follow up appointment in 3 months.

## 2013-05-26 NOTE — Progress Notes (Signed)
Pre visit review using our clinic review tool, if applicable. No additional management support is needed unless otherwise documented below in the visit note. 

## 2013-05-26 NOTE — Assessment & Plan Note (Signed)
Appears to be fungal in nature. Recommended trial of otc lotrimin cream bid.

## 2013-05-26 NOTE — Assessment & Plan Note (Signed)
Clinically stable. Obtain A1C and bmet.

## 2013-05-27 ENCOUNTER — Encounter: Payer: Self-pay | Admitting: Family

## 2013-06-09 ENCOUNTER — Telehealth: Payer: Self-pay

## 2013-06-09 NOTE — Telephone Encounter (Signed)
Relevant patient education assigned to patient using Emmi. ° °

## 2013-08-25 ENCOUNTER — Ambulatory Visit (INDEPENDENT_AMBULATORY_CARE_PROVIDER_SITE_OTHER): Payer: 59 | Admitting: Family

## 2013-08-25 ENCOUNTER — Other Ambulatory Visit: Payer: Self-pay | Admitting: Family

## 2013-08-25 ENCOUNTER — Encounter: Payer: Self-pay | Admitting: Family

## 2013-08-25 VITALS — BP 110/70 | HR 81 | Temp 98.4°F | Resp 16 | Ht 64.0 in | Wt 167.1 lb

## 2013-08-25 DIAGNOSIS — E119 Type 2 diabetes mellitus without complications: Secondary | ICD-10-CM

## 2013-08-25 DIAGNOSIS — E559 Vitamin D deficiency, unspecified: Secondary | ICD-10-CM

## 2013-08-25 DIAGNOSIS — E785 Hyperlipidemia, unspecified: Secondary | ICD-10-CM

## 2013-08-25 DIAGNOSIS — F419 Anxiety disorder, unspecified: Secondary | ICD-10-CM

## 2013-08-25 DIAGNOSIS — F32A Depression, unspecified: Secondary | ICD-10-CM

## 2013-08-25 DIAGNOSIS — Z0000 Encounter for general adult medical examination without abnormal findings: Secondary | ICD-10-CM

## 2013-08-25 DIAGNOSIS — Z1231 Encounter for screening mammogram for malignant neoplasm of breast: Secondary | ICD-10-CM

## 2013-08-25 DIAGNOSIS — F329 Major depressive disorder, single episode, unspecified: Secondary | ICD-10-CM

## 2013-08-25 DIAGNOSIS — F341 Dysthymic disorder: Secondary | ICD-10-CM

## 2013-08-25 LAB — BASIC METABOLIC PANEL WITH GFR
BUN: 11 mg/dL (ref 6–23)
CALCIUM: 10.1 mg/dL (ref 8.4–10.5)
CO2: 27 mEq/L (ref 19–32)
CREATININE: 1.03 mg/dL (ref 0.50–1.10)
Chloride: 101 mEq/L (ref 96–112)
GFR, EST NON AFRICAN AMERICAN: 61 mL/min
GFR, Est African American: 71 mL/min
Glucose, Bld: 93 mg/dL (ref 70–99)
Potassium: 3.7 mEq/L (ref 3.5–5.3)
Sodium: 139 mEq/L (ref 135–145)

## 2013-08-25 LAB — HEMOGLOBIN A1C
Hgb A1c MFr Bld: 5.7 % — ABNORMAL HIGH (ref <5.7)
Mean Plasma Glucose: 117 mg/dL — ABNORMAL HIGH (ref <117)

## 2013-08-25 NOTE — Progress Notes (Signed)
Subjective:    Patient ID: Kaitlyn Harper, female    DOB: 1960/12/24, 53 y.o.   MRN: [id number]  HPI  Kaitlyn Harper is a 53 yr old female who presents today for follow up of multiple medical problems:  1) DM2-maintained on metformin.  She is not checking sugars at home, but denies symptomatic hypoglycemia.  Lab Results  Component Value Date   HGBA1C 6.0* 05/26/2013   HGBA1C 5.9* 02/24/2013   HGBA1C 5.8* 11/25/2012   Lab Results  Component Value Date   MICROALBUR 0.50 08/26/2012   LDLCALC 50 02/24/2013   CREATININE 1.02 05/26/2013   2) HTN- Maintained on exforge hct and bystolic. Denies CP/SOB or swelling.  3) Hyperlipidemia- Maintained on lipitor 25m.    4) Anxiety/depression- pt is followed by psychiatry and reports good mood, well controlled.  Review of Systems See HPI  Past Medical History  Diagnosis Date  . Hypertension   . Hyperlipidemia   . Hyperglycemia   . Anemia   . Anxiety   . Depression     History   Social History  . Marital Status: Divorced    Spouse Name: N/A    Number of Children: 3  . Years of Education: N/A   Occupational History  . RN SECRETARY    Social History Main Topics  . Smoking status: Former Smoker    Types: Cigarettes    Quit date: 02/19/2011  . Smokeless tobacco: Never Used     Comment: 10 cigarettes daily  . Alcohol Use: Yes     Comment: socially  . Drug Use: No  . Sexual Activity: Yes    Birth Control/ Protection: Post-menopausal   Other Topics Concern  . Not on file   Social History Narrative   Caffeine use:  2 drinks   Regular exercise: no          Past Surgical History  Procedure Laterality Date  . Cholecystectomy  1985  . Appendectomy  1985  . Tubal ligation  1985  . Knee surgery  1983    arthroscopy?  . Dilation and curettage of uterus  2005  . Polypectomy  2006    ?anal polyp?  .Marland KitchenHemorrhoid surgery  2002  . Tear duct probing  2004    Family History  Problem Relation Age of Onset  . Hypertension  Father   . Sarcoidosis Brother   . Stroke Maternal Grandmother   . Alcohol abuse Brother   . Cirrhosis Brother     Allergies  Allergen Reactions  . Sulfonamide Derivatives Hives    Light sensitivity    Current Outpatient Prescriptions on File Prior to Visit  Medication Sig Dispense Refill  . ARIPiprazole (ABILIFY) 5 MG tablet Take 5 mg by mouth daily.      .Marland Kitchenaspirin EC 81 MG tablet Take 81 mg by mouth daily.      .Marland Kitchenatorvastatin (LIPITOR) 20 MG tablet TAKE 1 TABLET BY MOUTH DAILY.  30 tablet  3  . BYSTOLIC 10 MG tablet TAKE 1 TABLET (10 MG TOTAL) BY MOUTH DAILY.  30 tablet  3  . EXFORGE HCT 5-160-12.5 MG TABS TAKE 1 TABLET BY MOUTH ONCE DAILY  30 tablet  3  . LamoTRIgine (LAMICTAL ODT) 25 (21)-50 (7) MG KIT Take 1054min the morning and 1509mt bedtime.      . lMarland Kitchenratadine (CLARITIN) 10 MG tablet Take 10 mg by mouth daily as needed.       . metFORMIN (GLUCOPHAGE) 500 MG tablet TAKE  1 TABLET (500 MG TOTAL) BY MOUTH 2 (TWO) TIMES DAILY WITH A MEAL.  60 tablet  5   No current facility-administered medications on file prior to visit.    BP 110/70  Pulse 81  Temp(Src) 98.4 F (36.9 C) (Oral)  Resp 16  Ht _0  (1.626 m)  Wt 167 lb 1.3 oz (75.787 kg)  BMI 28.67 kg/m2  SpO2 99%  LMP 12/25/2008       Objective:   Physical Exam  Constitutional: She is oriented to person, place, and time. She appears well-developed and well-nourished. No distress.  Cardiovascular: Normal rate and regular rhythm.   No murmur heard. Pulmonary/Chest: Effort normal and breath sounds normal. No respiratory distress. She has no wheezes. She has no rales. She exhibits no tenderness.  Neurological: She is alert and oriented to person, place, and time.  Psychiatric: She has a normal mood and affect. Her behavior is normal. Judgment and thought content normal.          Assessment & Plan:

## 2013-08-25 NOTE — Progress Notes (Signed)
Pre visit review using our clinic review tool, if applicable. No additional management support is needed unless otherwise documented below in the visit note. 

## 2013-08-25 NOTE — Patient Instructions (Signed)
Please complete lab work prior to leaving.   Please schedule a follow up appointment in 3 months.  

## 2013-08-26 ENCOUNTER — Telehealth: Payer: Self-pay | Admitting: Family

## 2013-08-26 LAB — MICROALBUMIN / CREATININE URINE RATIO
Creatinine, Urine: 261.7 mg/dL
MICROALB/CREAT RATIO: 7.5 mg/g (ref 0.0–30.0)
Microalb, Ur: 1.95 mg/dL — ABNORMAL HIGH (ref 0.00–1.89)

## 2013-08-26 LAB — VITAMIN D 25 HYDROXY (VIT D DEFICIENCY, FRACTURES): Vit D, 25-Hydroxy: 80 ng/mL (ref 30–89)

## 2013-08-26 MED ORDER — CHOLECALCIFEROL 25 MCG (1000 UT) PO CAPS: 3000.0000 [IU] | ORAL_CAPSULE | Freq: Every day | ORAL | Status: DC

## 2013-08-26 NOTE — Telephone Encounter (Signed)
Left detailed message on voicemail and to call if any questions. 

## 2013-08-26 NOTE — Telephone Encounter (Signed)
Sugar is at goal. Small amount of protein is noted in urine likely due to history of diabetes and Hypertension.  Vit D level is upper limit normal. She can switch from weekly dosing to 3000 units PO daily otc

## 2013-08-27 NOTE — Assessment & Plan Note (Signed)
Stable, management per psych 

## 2013-08-27 NOTE — Assessment & Plan Note (Signed)
Lab Results  Component Value Date   HGBA1C 5.7* 08/25/2013   A1C remains stable on metformin, continue same.

## 2013-08-27 NOTE — Assessment & Plan Note (Signed)
Lab Results  Component Value Date   CHOL 96 02/24/2013   HDL 30* 02/24/2013   LDLCALC 50 02/24/2013   TRIG 81 02/24/2013   CHOLHDL 3.2 02/24/2013   LDL at goal on lipitor. Continue same.

## 2013-09-08 ENCOUNTER — Other Ambulatory Visit: Payer: Self-pay | Admitting: Family

## 2013-10-16 ENCOUNTER — Other Ambulatory Visit: Payer: Self-pay | Admitting: Family

## 2013-12-02 ENCOUNTER — Inpatient Hospital Stay (HOSPITAL_BASED_OUTPATIENT_CLINIC_OR_DEPARTMENT_OTHER): Admission: RE | Admit: 2013-12-02 | Payer: 59 | Source: Ambulatory Visit

## 2013-12-02 ENCOUNTER — Encounter: Payer: Self-pay | Admitting: Family

## 2013-12-02 ENCOUNTER — Ambulatory Visit (HOSPITAL_BASED_OUTPATIENT_CLINIC_OR_DEPARTMENT_OTHER): Payer: 59

## 2013-12-02 ENCOUNTER — Ambulatory Visit (INDEPENDENT_AMBULATORY_CARE_PROVIDER_SITE_OTHER): Payer: 59 | Admitting: Family

## 2013-12-02 VITALS — BP 135/72 | HR 74 | Temp 98.7°F | Wt 173.4 lb

## 2013-12-02 DIAGNOSIS — F418 Other specified anxiety disorders: Secondary | ICD-10-CM

## 2013-12-02 DIAGNOSIS — F419 Anxiety disorder, unspecified: Secondary | ICD-10-CM

## 2013-12-02 DIAGNOSIS — I10 Essential (primary) hypertension: Secondary | ICD-10-CM

## 2013-12-02 DIAGNOSIS — E119 Type 2 diabetes mellitus without complications: Secondary | ICD-10-CM

## 2013-12-02 DIAGNOSIS — E785 Hyperlipidemia, unspecified: Secondary | ICD-10-CM

## 2013-12-02 DIAGNOSIS — F329 Major depressive disorder, single episode, unspecified: Secondary | ICD-10-CM

## 2013-12-02 DIAGNOSIS — F32A Depression, unspecified: Secondary | ICD-10-CM

## 2013-12-02 LAB — BASIC METABOLIC PANEL
BUN: 9 mg/dL (ref 6–23)
CO2: 33 meq/L — AB (ref 19–32)
Calcium: 10.1 mg/dL (ref 8.4–10.5)
Chloride: 101 mEq/L (ref 96–112)
Creatinine, Ser: 1.2 mg/dL (ref 0.4–1.2)
GFR: 58.18 mL/min — ABNORMAL LOW (ref >60.00)
Glucose, Bld: 114 mg/dL — ABNORMAL HIGH (ref 70–99)
Potassium: 3.1 mEq/L — ABNORMAL LOW (ref 3.5–5.1)
Sodium: 139 mEq/L (ref 135–145)

## 2013-12-02 LAB — HEMOGLOBIN A1C: Hgb A1c MFr Bld: 5.8 % (ref 4.6–6.5)

## 2013-12-02 NOTE — Progress Notes (Signed)
Subjective:    Patient ID: Kaitlyn Harper, female    DOB: 1960/09/12, 53 y.o.   MRN: [id number]  HPI  Kaitlyn Harper is a 53 year old female who presents today for follow up.  1. Hypertension- Currently on Bystolic and Exforge.  Today's blood pressure is 135/72. She has not taken her medicines yet today.  Does not take home BP's.    BP Readings from Last 3 Encounters:  12/02/13 135/72  08/25/13 110/70  05/26/13 118/80   2. Diabetes - Does not take blood sugars at home.  Denies any symptomatic hypoglycemia.  She reports that she does not follow any special diet and does not stay away from carbohydrates or sugars.   Lab Results  Component Value Date   HGBA1C 5.7* 08/25/2013    3. Hyperlipidemia - Take lipitor with high compliance.  Denies myalgia.   Lipid Panel     Component Value Date/Time   CHOL 96 02/24/2013 1147   TRIG 81 02/24/2013 1147   HDL 30* 02/24/2013 1147   CHOLHDL 3.2 02/24/2013 1147   VLDL 16 02/24/2013 1147   LDLCALC 50 02/24/2013 1147     4. Depression/anxiety- Currently on lamictal and abilify.  Sees pychiatry for management.  Her next appointment is in December.      Review of Systems  Constitutional: Negative for chills, activity change and fatigue.  HENT: Negative.   Respiratory: Negative for cough and shortness of breath.   Cardiovascular: Negative for chest pain, palpitations and leg swelling.  Genitourinary: Negative.   Musculoskeletal: Negative.   Skin: Negative for rash and wound.  Neurological: Negative for dizziness, light-headedness and headaches.  Psychiatric/Behavioral: Negative.    Past Medical History  Diagnosis Date  . Hypertension   . Hyperlipidemia   . Hyperglycemia   . Anemia   . Anxiety   . Depression     History   Social History  . Marital Status: Divorced    Spouse Name: N/A    Number of Children: 3  . Years of Education: N/A   Occupational History  . RN SECRETARY    Social History Main Topics  . Smoking status:  Former Smoker    Types: Cigarettes    Quit date: 02/19/2011  . Smokeless tobacco: Never Used     Comment: 10 cigarettes daily  . Alcohol Use: Yes     Comment: socially  . Drug Use: No  . Sexual Activity: Yes    Birth Control/ Protection: Post-menopausal   Other Topics Concern  . Not on file   Social History Narrative   Caffeine use:  2 drinks   Regular exercise: no          Past Surgical History  Procedure Laterality Date  . Cholecystectomy  1985  . Appendectomy  1985  . Tubal ligation  1985  . Knee surgery  1983    arthroscopy?  . Dilation and curettage of uterus  2005  . Polypectomy  2006    ?anal polyp?  [name] Hemorrhoid surgery  2002  . Tear duct probing  2004    Family History  Problem Relation Age of Onset  . Hypertension Father   . Sarcoidosis Brother   . Stroke Maternal Grandmother   . Alcohol abuse Brother   . Cirrhosis Brother     Allergies  Allergen Reactions  . Sulfonamide Derivatives Hives    Light sensitivity    Current Outpatient Prescriptions on File Prior to Visit  Medication Sig Dispense Refill  .  ARIPiprazole (ABILIFY) 5 MG tablet Take 5 mg by mouth daily.      [name] aspirin EC 81 MG tablet Take 81 mg by mouth daily.      [name] atorvastatin (LIPITOR) 20 MG tablet TAKE 1 TABLET BY MOUTH DAILY.  30 tablet  3  . BYSTOLIC 10 MG tablet TAKE 1 TABLET (10 MG TOTAL) BY MOUTH DAILY.  30 tablet  3  . EXFORGE HCT 5-160-12.5 MG TABS TAKE 1 TABLET BY MOUTH ONCE DAILY  30 tablet  3  . LamoTRIgine (LAMICTAL ODT) 25 (21)-50 (7) MG KIT Take 121m in the morning and 1530mat bedtime.      . Marland Kitchenoratadine (CLARITIN) 10 MG tablet Take 10 mg by mouth daily as needed.       . metFORMIN (GLUCOPHAGE) 500 MG tablet TAKE 1 TABLET (500 MG TOTAL) BY MOUTH 2 (TWO) TIMES DAILY WITH A MEAL.  60 tablet  3  . Cholecalciferol ([employer] VITAMIN D3) 1000 UNITS capsule Take 3 capsules (3,000 Units total) by mouth daily.       No current facility-administered medications on file prior to  visit.    BP 135/72  Pulse 74  Temp(Src) 98.7 F (37.1 C) (Oral)  Wt 173 lb 6.4 oz (78.654 kg)  SpO2 100%  LMP 12/25/2008       Objective:   Physical Exam  Constitutional: She is oriented to person, place, and time. She appears well-developed and well-nourished.  HENT:  Head: Normocephalic and atraumatic.  Cardiovascular: Normal rate, regular rhythm, normal heart sounds and intact distal pulses.   No murmur heard. Pulmonary/Chest: Effort normal and breath sounds normal. No respiratory distress. She has no wheezes. She has no rales.  Musculoskeletal: Normal range of motion.  Neurological: She is alert and oriented to person, place, and time.  Skin: Skin is warm and dry.  Psychiatric: She has a normal mood and affect.          Assessment & Plan:  Will obtain BMET and A1C today.  Continue taking meds as prescribed.    I have personally seen and examined patient and agree with ErJettie BoozeP student's assessment and plan. MeDebbrah AlarP

## 2013-12-02 NOTE — Patient Instructions (Signed)
Please complete lab work prior to leaving. Follow up in 3 months.  

## 2013-12-02 NOTE — Assessment & Plan Note (Signed)
Obtain A1C today, continue Metformin.

## 2013-12-02 NOTE — Assessment & Plan Note (Signed)
Stable on meds, continue same.  

## 2013-12-02 NOTE — Assessment & Plan Note (Signed)
Stable on meds, Continue same.  Will obtain BMET today.

## 2013-12-02 NOTE — Progress Notes (Signed)
Pre visit review using our clinic review tool, if applicable. No additional management support is needed unless otherwise documented below in the visit note. 

## 2013-12-02 NOTE — Assessment & Plan Note (Signed)
Managed by psychiatry.  Has appointment with them in December.

## 2013-12-03 ENCOUNTER — Telehealth: Payer: Self-pay | Admitting: Family

## 2013-12-03 DIAGNOSIS — E876 Hypokalemia: Secondary | ICD-10-CM

## 2013-12-03 LAB — HM DIABETES EYE EXAM

## 2013-12-03 MED ORDER — POTASSIUM CHLORIDE CRYS ER 20 MEQ PO TBCR: EXTENDED_RELEASE_TABLET | ORAL | Status: DC

## 2013-12-03 NOTE — Telephone Encounter (Signed)
Potassium is low. Add K Dur 20 mEQ- take 2 tabs by mouth today, then one tablet by mouth daily. Repeat bmet in 1 week, dx hypokalemia.

## 2013-12-04 ENCOUNTER — Ambulatory Visit (HOSPITAL_BASED_OUTPATIENT_CLINIC_OR_DEPARTMENT_OTHER): Payer: 59

## 2013-12-04 NOTE — Telephone Encounter (Signed)
Left detailed message on pt's cell# and to call and schedule lab appt in 1 week. Future lab order entered.

## 2013-12-04 NOTE — Telephone Encounter (Signed)
Unable to leave message as "caller is not accepting messages at this time". Will try later.

## 2013-12-08 ENCOUNTER — Ambulatory Visit (HOSPITAL_BASED_OUTPATIENT_CLINIC_OR_DEPARTMENT_OTHER)
Admission: RE | Admit: 2013-12-08 | Discharge: 2013-12-08 | Disposition: A | Discharge status: Home / Self Care | Payer: 59 | Source: Ambulatory Visit | Attending: Family | Admitting: Family

## 2013-12-08 DIAGNOSIS — Z1231 Encounter for screening mammogram for malignant neoplasm of breast: Secondary | ICD-10-CM | POA: Insufficient documentation

## 2013-12-09 ENCOUNTER — Other Ambulatory Visit: Payer: Self-pay | Admitting: Family

## 2013-12-09 NOTE — Telephone Encounter (Signed)
No pt sees psych.

## 2013-12-09 NOTE — Telephone Encounter (Signed)
Do we prescribe these for pt?  Medication name:  Name from pharmacy:  ARIPiprazole (ABILIFY) 5 MG tablet  ARIPIPRAZOLE 5 MG TABLET 5 MG TAB Sig: TAKE 1 & 1/2 TABLET BY MOUTH EVERY MORNING Dispense: 135 tablet Refills: 1 Start: 12/09/2013 Class: Normal Requested on: 06/18/2013 Originally ordered on: 05/23/2011 Last refill: 09/16/2013 Order History and Details   Medication name:  Name from pharmacy:  lamoTRIgine (LAMICTAL) 100 MG tablet  LAMOTRIGINE 100 MG TABLET 100 MG TAB Sig: TAKE 1 TABLET BY MOUTH EVERY MORNING AND 1 & 1/2 TABLET AT BEDTIME Dispense: 225 tablet Refills: 1 Start: 12/09/2013 Class: Normal Requested on: 06/18/2013 Last refill: 09/16/2013

## 2013-12-09 NOTE — Telephone Encounter (Signed)
Denial sent to pharmacy with note to contact psychiatrist for refills.

## 2013-12-10 ENCOUNTER — Other Ambulatory Visit: Payer: Self-pay | Admitting: Family

## 2013-12-10 DIAGNOSIS — R928 Other abnormal and inconclusive findings on diagnostic imaging of breast: Secondary | ICD-10-CM

## 2013-12-14 ENCOUNTER — Encounter: Payer: Self-pay | Admitting: Family

## 2013-12-14 ENCOUNTER — Ambulatory Visit (INDEPENDENT_AMBULATORY_CARE_PROVIDER_SITE_OTHER): Payer: 59 | Admitting: Family

## 2013-12-14 VITALS — BP 118/76 | HR 78 | Temp 98.2°F | Resp 16 | Ht 64.0 in | Wt 176.8 lb

## 2013-12-14 DIAGNOSIS — M25512 Pain in left shoulder: Secondary | ICD-10-CM

## 2013-12-14 DIAGNOSIS — E876 Hypokalemia: Secondary | ICD-10-CM

## 2013-12-14 LAB — BASIC METABOLIC PANEL
BUN: 8 mg/dL (ref 6–23)
CO2: 21 meq/L (ref 19–32)
CREATININE: 1.1 mg/dL (ref 0.4–1.2)
Calcium: 9.6 mg/dL (ref 8.4–10.5)
Chloride: 105 mEq/L (ref 96–112)
GFR: 65.49 mL/min (ref >60.00)
Glucose, Bld: 105 mg/dL — ABNORMAL HIGH (ref 70–99)
Potassium: 3.5 mEq/L (ref 3.5–5.1)
SODIUM: 140 meq/L (ref 135–145)

## 2013-12-14 MED ORDER — MELOXICAM 7.5 MG PO TABS: 7.5000 mg | ORAL_TABLET | Freq: Every day | ORAL | Status: DC

## 2013-12-14 NOTE — Patient Instructions (Signed)
Please complete lab work prior to leaving. Start meloxicam as needed for pain.  Contact Dr. Lorin Mercy office to arrange a follow up appointment. Follow up in 3 months.

## 2013-12-14 NOTE — Assessment & Plan Note (Signed)
Add meloxicam, refer back to ortho for further evaluation.

## 2013-12-14 NOTE — Progress Notes (Signed)
Pre visit review using our clinic review tool, if applicable. No additional management support is needed unless otherwise documented below in the visit note. 

## 2013-12-14 NOTE — Progress Notes (Signed)
Subjective:    Patient ID: [name], female    DOB: 1960/02/23, 53 y.o.   MRN: [id number]  HPI Ms. Cromie is a 53 year old female who presents today with chief compliant of left shoulder pain.    1. Left shoulder pain - Pain started 2 years ago and has been intermittent since then. Has seen Dr. Lorin Mercy in the past for shoulder pain and had a steroid injection which helped briefly. Pain has worsened two weeks ago.  Pain is described as a sharp aching pain.  Has associated left anterior chest wall tenderness. Denies chest pain.  Moving and sleeping on that side make the pain worse.  She has tried ibuprofen 400 mg  without relief, last dose was yesterday. Denies injury.  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     Review of Systems  Constitutional: Negative for fever, chills and fatigue.  HENT: Negative.   Respiratory: Negative for cough and shortness of breath.   Cardiovascular: Negative for chest pain, palpitations and leg swelling.  Musculoskeletal:       Left shoulder pain  Skin: Negative for pallor, rash and wound.   Past Medical History  Diagnosis Date  . Hypertension   . Hyperlipidemia   . Hyperglycemia   . Anemia   . Anxiety   . Depression     History   Social History  . Marital Status: Divorced    Spouse Name: N/A    Number of Children: 3  . Years of Education: N/A   Occupational History  . RN SECRETARY    Social History Main Topics  . Smoking status: Former Smoker    Types: Cigarettes    Quit date: 02/19/2011  . Smokeless tobacco: Never Used     Comment: 10 cigarettes daily  . Alcohol Use: Yes     Comment: socially  . Drug Use: No  . Sexual Activity: Yes    Birth Control/ Protection: Post-menopausal   Other Topics Concern  . Not on file   Social History Narrative   Caffeine use:  2 drinks   Regular exercise: no          Past Surgical History  Procedure Laterality Date  . Cholecystectomy  1985  .  Appendectomy  1985  . Tubal ligation  1985  . Knee surgery  1983    arthroscopy?  . Dilation and curettage of uterus  2005  . Polypectomy  2006    ?anal polyp?  [name] Hemorrhoid surgery  2002  . Tear duct probing  2004    Family History  Problem Relation Age of Onset  . Hypertension Father   . Sarcoidosis Brother   . Stroke Maternal Grandmother   . Alcohol abuse Brother   . Cirrhosis Brother     Allergies  Allergen Reactions  . Sulfonamide Derivatives Hives    Light sensitivity    Current Outpatient Prescriptions on File Prior to Visit  Medication Sig Dispense Refill  . ARIPiprazole (ABILIFY) 5 MG tablet Take 5 mg by mouth daily.    [name] aspirin EC 81 MG tablet Take 81 mg by mouth daily.    [name] atorvastatin (LIPITOR) 20 MG tablet TAKE 1 TABLET BY MOUTH DAILY. 30 tablet 3  . BYSTOLIC 10 MG tablet TAKE 1 TABLET (10 MG TOTAL) BY MOUTH DAILY. 30 tablet 3  . Cholecalciferol ([employer] VITAMIN D3) 1000 UNITS capsule Take 3 capsules (3,000 Units total) by mouth daily.    [name]  EXFORGE HCT 5-160-12.5 MG TABS TAKE 1 TABLET BY MOUTH ONCE DAILY 30 tablet 3  . LamoTRIgine (LAMICTAL ODT) 25 (21)-50 (7) MG KIT Take 11m in the morning and 1527mat bedtime.    . Marland Kitchenoratadine (CLARITIN) 10 MG tablet Take 10 mg by mouth daily as needed.     . metFORMIN (GLUCOPHAGE) 500 MG tablet TAKE 1 TABLET (500 MG TOTAL) BY MOUTH 2 (TWO) TIMES DAILY WITH A MEAL. 60 tablet 3  . potassium chloride SA (K-DUR,KLOR-CON) 20 MEQ tablet Take 2 tabs by mouth today, then one tablet by mouth once daily 30 tablet 0   No current facility-administered medications on file prior to visit.    BP 118/76 mmHg  Pulse 78  Temp(Src) 98.2 F (36.8 C) (Oral)  Resp 16  Ht _0  (1.626 m)  Wt 176 lb 12.8 oz (80.196 kg)  BMI 30.33 kg/m2  SpO2 100%  LMP 12/25/2008       Objective:   Physical Exam  Constitutional: She appears well-developed and well-nourished.  HENT:  Head: Normocephalic and atraumatic.  Neck: Normal range of motion.    Cardiovascular: Normal rate, regular rhythm and normal heart sounds.   No murmur heard. Pulmonary/Chest: Effort normal and breath sounds normal.  Musculoskeletal:       Left shoulder: She exhibits tenderness and pain. She exhibits no swelling, no laceration and normal strength.  Left anterior chest wall is tender to palpation.  Skin: Skin is warm and dry.          Assessment & Plan:

## 2013-12-15 ENCOUNTER — Encounter: Payer: Self-pay | Admitting: Family

## 2013-12-15 ENCOUNTER — Other Ambulatory Visit: Payer: Self-pay | Admitting: Obstetrics and Gynecology

## 2013-12-30 ENCOUNTER — Other Ambulatory Visit: Payer: Self-pay | Admitting: Family

## 2013-12-30 ENCOUNTER — Ambulatory Visit
Admission: RE | Admit: 2013-12-30 | Discharge: 2013-12-30 | Disposition: A | Discharge status: Home / Self Care | Payer: 59 | Source: Ambulatory Visit | Attending: Family | Admitting: Family

## 2013-12-30 DIAGNOSIS — R928 Other abnormal and inconclusive findings on diagnostic imaging of breast: Secondary | ICD-10-CM

## 2014-01-11 ENCOUNTER — Other Ambulatory Visit: Payer: Self-pay | Admitting: Family

## 2014-01-13 ENCOUNTER — Ambulatory Visit
Admission: RE | Admit: 2014-01-13 | Discharge: 2014-01-13 | Disposition: A | Discharge status: Home / Self Care | Payer: 59 | Source: Ambulatory Visit | Attending: Family | Admitting: Family

## 2014-01-13 ENCOUNTER — Other Ambulatory Visit: Payer: Self-pay | Admitting: Family

## 2014-01-13 DIAGNOSIS — N6310 Unspecified lump in the right breast, unspecified quadrant: Secondary | ICD-10-CM

## 2014-01-13 HISTORY — PX: BREAST BIOPSY: SHX20

## 2014-01-14 ENCOUNTER — Other Ambulatory Visit: Payer: Self-pay | Admitting: Family

## 2014-02-23 ENCOUNTER — Other Ambulatory Visit: Payer: Self-pay | Admitting: Family

## 2014-03-09 ENCOUNTER — Encounter: Payer: Self-pay | Admitting: Family

## 2014-03-09 ENCOUNTER — Ambulatory Visit (INDEPENDENT_AMBULATORY_CARE_PROVIDER_SITE_OTHER): Payer: 59 | Admitting: Family

## 2014-03-09 VITALS — BP 108/70 | HR 74 | Temp 98.3°F | Resp 18 | Ht 64.0 in | Wt 177.8 lb

## 2014-03-09 DIAGNOSIS — E119 Type 2 diabetes mellitus without complications: Secondary | ICD-10-CM

## 2014-03-09 DIAGNOSIS — F419 Anxiety disorder, unspecified: Secondary | ICD-10-CM

## 2014-03-09 DIAGNOSIS — F418 Other specified anxiety disorders: Secondary | ICD-10-CM

## 2014-03-09 DIAGNOSIS — F329 Major depressive disorder, single episode, unspecified: Secondary | ICD-10-CM

## 2014-03-09 DIAGNOSIS — I10 Essential (primary) hypertension: Secondary | ICD-10-CM

## 2014-03-09 DIAGNOSIS — F32A Depression, unspecified: Secondary | ICD-10-CM

## 2014-03-09 DIAGNOSIS — E785 Hyperlipidemia, unspecified: Secondary | ICD-10-CM

## 2014-03-09 LAB — BASIC METABOLIC PANEL
BUN: 8 mg/dL (ref 6–23)
CO2: 29 mEq/L (ref 19–32)
Calcium: 9.4 mg/dL (ref 8.4–10.5)
Chloride: 105 mEq/L (ref 96–112)
Creatinine, Ser: 1.07 mg/dL (ref 0.40–1.20)
GFR: 68.26 mL/min (ref >60.00)
GLUCOSE: 99 mg/dL (ref 70–99)
Potassium: 3.7 mEq/L (ref 3.5–5.1)
Sodium: 140 mEq/L (ref 135–145)

## 2014-03-09 LAB — LIPID PANEL
Cholesterol: 88 mg/dL (ref 0–200)
HDL: 32.2 mg/dL — ABNORMAL LOW
LDL Cholesterol: 34 mg/dL (ref 0–99)
NonHDL: 55.8
Total CHOL/HDL Ratio: 3
Triglycerides: 107 mg/dL (ref 0.0–149.0)
VLDL: 21.4 mg/dL (ref 0.0–40.0)

## 2014-03-09 LAB — HEMOGLOBIN A1C: Hgb A1c MFr Bld: 6 % (ref 4.6–6.5)

## 2014-03-09 NOTE — Patient Instructions (Signed)
Please complete lab work prior to leaving. Follow up in 3 months.  

## 2014-03-09 NOTE — Assessment & Plan Note (Signed)
Lab Results  Component Value Date   CHOL 88 03/09/2014   HDL 32.20* 03/09/2014   LDLCALC 34 03/09/2014   TRIG 107.0 03/09/2014   CHOLHDL 3 03/09/2014   Cholesterol remains extremely well controlled. Continue statin.

## 2014-03-09 NOTE — Assessment & Plan Note (Signed)
Stable on metformin.  Follow up A1C remains at goal. Continue current dose metformin.  Lab Results  Component Value Date   HGBA1C 6.0 03/09/2014

## 2014-03-09 NOTE — Progress Notes (Signed)
Subjective:    Patient ID: Kaitlyn Harper, female    DOB: October 02, 1960, 54 y.o.   MRN: [id number]  HPI  Kaitlyn Harper is a 54 yr old female who presents today for follow up.  1) HTN- Patient is currently maintained on the following medications for blood pressure: exforge hct and bystolic BP Readings from Last 3 Encounters:  03/09/14 108/70  12/14/13 118/76  12/02/13 135/72   Patient reports good compliance with blood pressure medications. Patient denies chest pain, shortness of breath or swelling.  2) DM2-  Pt is currently maintained on the following medications for diabetes:  metformin Last A1C was:   Lab Results  Component Value Date   HGBA1C 5.8 12/02/2013  Last diabetic eye exam was: 12/03/13 Denies polyuria/polydipsia. Denies hypoglycemia Home glucose readings range- not checking.  Last eye exam in october  3) Hyperlipidemia-  Maintained on lipitor.  Lab Results  Component Value Date   CHOL 96 02/24/2013   HDL 30* 02/24/2013   LDLCALC 50 02/24/2013   TRIG 81 02/24/2013   CHOLHDL 3.2 02/24/2013     Review of Systems See HPI  Past Medical History  Diagnosis Date  . Hypertension   . Hyperlipidemia   . Hyperglycemia   . Anemia   . Anxiety   . Depression     History   Social History  . Marital Status: Divorced    Spouse Name: N/A    Number of Children: 3  . Years of Education: N/A   Occupational History  . RN SECRETARY    Social History Main Topics  . Smoking status: Former Smoker    Types: Cigarettes    Quit date: 02/19/2011  . Smokeless tobacco: Never Used     Comment: 10 cigarettes daily  . Alcohol Use: Yes     Comment: socially  . Drug Use: No  . Sexual Activity: Yes    Birth Control/ Protection: Post-menopausal   Other Topics Concern  . Not on file   Social History Narrative   Caffeine use:  2 drinks   Regular exercise: no          Past Surgical History  Procedure Laterality Date  . Cholecystectomy  1985  . Appendectomy  1985   . Tubal ligation  1985  . Knee surgery  1983    arthroscopy?  . Dilation and curettage of uterus  2005  . Polypectomy  2006    ?anal polyp?  [name] Hemorrhoid surgery  2002  . Tear duct probing  2004    Family History  Problem Relation Age of Onset  . Hypertension Father   . Sarcoidosis Brother   . Stroke Maternal Grandmother   . Alcohol abuse Brother   . Cirrhosis Brother     Allergies  Allergen Reactions  . Sulfonamide Derivatives Hives    Light sensitivity    Current Outpatient Prescriptions on File Prior to Visit  Medication Sig Dispense Refill  . ARIPiprazole (ABILIFY) 5 MG tablet Take 5 mg by mouth daily.    [name] aspirin EC 81 MG tablet Take 81 mg by mouth daily.    [name] atorvastatin (LIPITOR) 20 MG tablet TAKE 1 TABLET BY MOUTH DAILY. 30 tablet 3  . BYSTOLIC 10 MG tablet TAKE 1 TABLET (10 MG TOTAL) BY MOUTH DAILY. 30 tablet 3  . EXFORGE HCT 5-160-12.5 MG TABS TAKE 1 TABLET BY MOUTH ONCE DAILY 30 tablet 3  . LamoTRIgine (LAMICTAL ODT) 25 (21)-50 (7) MG KIT Take 100mg  in the morning  and '150mg'$  at bedtime.    [name] loratadine (CLARITIN) 10 MG tablet Take 10 mg by mouth daily as needed.     . metFORMIN (GLUCOPHAGE) 500 MG tablet TAKE 1 TABLET (500 MG) BY MOUTH 2 TIMES DAILY WITH A MEAL. 60 tablet 3   No current facility-administered medications on file prior to visit.    BP 108/70 mmHg  Pulse 74  Temp(Src) 98.3 F (36.8 C) (Oral)  Resp 18  Ht $R'5\' 4"'Cb$  (1.626 m)  Wt 177 lb 12.8 oz (80.65 kg)  BMI 30.50 kg/m2  SpO2 97%  LMP 12/25/2008       Objective:   Physical Exam  Constitutional: She is oriented to person, place, and time. She appears well-developed and well-nourished. No distress.  HENT:  Head: Normocephalic and atraumatic.  Cardiovascular: Normal rate and regular rhythm.   No murmur heard. Pulmonary/Chest: Effort normal and breath sounds normal. No respiratory distress. She has no wheezes. She has no rales. She exhibits no tenderness.  Musculoskeletal: She exhibits  no edema.  Neurological: She is alert and oriented to person, place, and time.  Psychiatric: She has a normal mood and affect. Her behavior is normal. Judgment and thought content normal.          Assessment & Plan:

## 2014-03-09 NOTE — Assessment & Plan Note (Signed)
BP is stable on current meds.  Obtain bmet.

## 2014-03-09 NOTE — Assessment & Plan Note (Signed)
Stable, managed by psychiatry.  

## 2014-04-02 ENCOUNTER — Encounter: Payer: Self-pay | Admitting: Gastroenterology

## 2014-05-31 ENCOUNTER — Other Ambulatory Visit: Payer: Self-pay | Admitting: Family

## 2014-05-31 NOTE — Telephone Encounter (Signed)
Medication Detail      Disp Refills Start End     atorvastatin (LIPITOR) 20 MG tablet 30 tablet 3 01/14/2014     Sig: TAKE 1 TABLET BY MOUTH DAILY.    E-Prescribing Status: Receipt confirmed by pharmacy (01/14/2014 11:03 AM EST)   Pharmacy    [place],  - [place]   Refill sent per [facility] refill protocol/SLS

## 2014-06-09 ENCOUNTER — Ambulatory Visit: Payer: 59 | Admitting: Family

## 2014-06-11 ENCOUNTER — Telehealth: Payer: Self-pay | Admitting: Family

## 2014-06-11 NOTE — Telephone Encounter (Signed)
Recd vm on cancel/reschedule line from pt to cancel appt 06/09/14 10:30am. No reason given. Recd msg 06/08/14 12:16pm.

## 2014-06-14 ENCOUNTER — Other Ambulatory Visit: Payer: Self-pay | Admitting: Family

## 2014-06-15 NOTE — Telephone Encounter (Signed)
30 day supply of Bystolic and Exforge HCT sent to pharmacy. Pt due for follow up now and no showed recent office visit.  Please call pt to arrange f/u as soon as possible.

## 2014-06-15 NOTE — Telephone Encounter (Signed)
LVM advising pt to call office to schedule follow up appointment

## 2014-06-25 ENCOUNTER — Encounter: Payer: Self-pay | Admitting: Family

## 2014-06-25 ENCOUNTER — Ambulatory Visit (INDEPENDENT_AMBULATORY_CARE_PROVIDER_SITE_OTHER): Payer: 59 | Admitting: Family

## 2014-06-25 VITALS — BP 120/70 | HR 67 | Temp 98.3°F | Resp 16 | Ht 64.0 in | Wt 161.6 lb

## 2014-06-25 DIAGNOSIS — F329 Major depressive disorder, single episode, unspecified: Secondary | ICD-10-CM

## 2014-06-25 DIAGNOSIS — F32A Depression, unspecified: Secondary | ICD-10-CM

## 2014-06-25 DIAGNOSIS — E785 Hyperlipidemia, unspecified: Secondary | ICD-10-CM | POA: Diagnosis not present

## 2014-06-25 DIAGNOSIS — F419 Anxiety disorder, unspecified: Secondary | ICD-10-CM

## 2014-06-25 DIAGNOSIS — I10 Essential (primary) hypertension: Secondary | ICD-10-CM

## 2014-06-25 DIAGNOSIS — F418 Other specified anxiety disorders: Secondary | ICD-10-CM | POA: Diagnosis not present

## 2014-06-25 DIAGNOSIS — E119 Type 2 diabetes mellitus without complications: Secondary | ICD-10-CM | POA: Diagnosis not present

## 2014-06-25 LAB — MICROALBUMIN / CREATININE URINE RATIO
Creatinine,U: 271.9 mg/dL
MICROALB/CREAT RATIO: 0.9 mg/g (ref 0.0–30.0)
Microalb, Ur: 2.5 mg/dL — ABNORMAL HIGH (ref 0.0–1.9)

## 2014-06-25 LAB — BASIC METABOLIC PANEL
BUN: 7 mg/dL (ref 6–23)
CO2: 27 meq/L (ref 19–32)
Calcium: 10.1 mg/dL (ref 8.4–10.5)
Chloride: 100 mEq/L (ref 96–112)
Creatinine, Ser: 1 mg/dL (ref 0.40–1.20)
GFR: 73.73 mL/min (ref >60.00)
GLUCOSE: 102 mg/dL — AB (ref 70–99)
POTASSIUM: 3.2 meq/L — AB (ref 3.5–5.1)
SODIUM: 136 meq/L (ref 135–145)

## 2014-06-25 LAB — HEMOGLOBIN A1C: HEMOGLOBIN A1C: 5.7 % (ref 4.6–6.5)

## 2014-06-25 MED ORDER — BYSTOLIC 10 MG PO TABS: ORAL_TABLET | ORAL | Status: DC

## 2014-06-25 MED ORDER — ATORVASTATIN CALCIUM 20 MG PO TABS: 20.0000 mg | ORAL_TABLET | Freq: Every day | ORAL | Status: DC

## 2014-06-25 MED ORDER — METFORMIN HCL 500 MG PO TABS: ORAL_TABLET | ORAL | Status: DC

## 2014-06-25 MED ORDER — EXFORGE HCT 5-160-12.5 MG PO TABS
1.0000 | ORAL_TABLET | Freq: Every day | ORAL | Status: DC
Start: 2014-06-25 — End: 2014-09-27

## 2014-06-25 NOTE — Patient Instructions (Signed)
Please complete lab work prior to leaving.   Please schedule a follow up appointment in 3 months.  

## 2014-06-25 NOTE — Progress Notes (Signed)
Pre visit review using our clinic review tool, if applicable. No additional management support is needed unless otherwise documented below in the visit note. 

## 2014-06-25 NOTE — Assessment & Plan Note (Signed)
At goal, continue statin.  

## 2014-06-25 NOTE — Assessment & Plan Note (Signed)
BP stable on current meds.  Obtain bmet. 

## 2014-06-25 NOTE — Progress Notes (Signed)
Subjective:    Patient ID: Kaitlyn Harper, female    DOB: 1961-02-12, 54 y.o.   MRN: [id number]  HPI  Kaitlyn Harper is a 54 yr old female who presents today for follow up.   Patient presents today for follow up of multiple medical problems.  Diabetes Type 2  Pt is currently maintained on the following medications for diabetes: metformin  Lab Results  Component Value Date   HGBA1C 6.0 03/09/2014   HGBA1C 5.8 12/02/2013   HGBA1C 5.7* 08/25/2013    Lab Results  Component Value Date   MICROALBUR 1.95* 08/25/2013   LDLCALC 34 03/09/2014   CREATININE 1.07 03/09/2014    Last diabetic eye exam was  Lab Results  Component Value Date   HMDIABEYEEXA No Retinopathy 12/03/2013   Denies polyuria/polydipsia. Denies hypoglycemia Home glucose readings range: does not check at home  Hyperlipidemia  Patient is currently maintained on the following medication for hyperlipidemia: atorvastatin Last lipid panel as follows:  Lab Results  Component Value Date   CHOL 88 03/09/2014   HDL 32.20* 03/09/2014   LDLCALC 34 03/09/2014   TRIG 107.0 03/09/2014   CHOLHDL 3 03/09/2014   Patient denies myalgia. Patient reports good compliance with low fat/low cholesterol diet.   Hypertension  Patient is currently maintained on the following medications for blood pressure: exforge and bystolic Patient reports good compliance with blood pressure medications. Patient denies chest pain, shortness of breath or swelling. Last 3 blood pressure readings in our office are as follows: BP Readings from Last 3 Encounters:  06/25/14 120/70  03/09/14 108/70  12/14/13 118/76   Depression- reports stable on current meds. Continues with psychiatry- sees Dr. June Leap.     Review of Systems See HPI  Past Medical History  Diagnosis Date  . Hypertension   . Hyperlipidemia   . Hyperglycemia   . Anemia   . Anxiety   . Depression     History   Social History  . Marital Status: Divorced   Spouse Name: N/A  . Number of Children: 3  . Years of Education: N/A   Occupational History  . RN SECRETARY    Social History Main Topics  . Smoking status: Former Smoker    Types: Cigarettes    Quit date: 02/19/2011  . Smokeless tobacco: Never Used     Comment: 10 cigarettes daily  . Alcohol Use: Yes     Comment: socially  . Drug Use: No  . Sexual Activity: Yes    Birth Control/ Protection: Post-menopausal   Other Topics Concern  . Not on file   Social History Narrative   Caffeine use:  2 drinks   Regular exercise: no          Past Surgical History  Procedure Laterality Date  . Cholecystectomy  1985  . Appendectomy  1985  . Tubal ligation  1985  . Knee surgery  1983    arthroscopy?  . Dilation and curettage of uterus  2005  . Polypectomy  2006    ?anal polyp?  [name] Hemorrhoid surgery  2002  . Tear duct probing  2004    Family History  Problem Relation Age of Onset  . Hypertension Father   . Sarcoidosis Brother   . Stroke Maternal Grandmother   . Alcohol abuse Brother   . Cirrhosis Brother     Allergies  Allergen Reactions  . Sulfonamide Derivatives Hives    Light sensitivity    Current Outpatient Prescriptions on File Prior to Visit  Medication Sig Dispense Refill  . ARIPiprazole (ABILIFY) 5 MG tablet Take 5 mg by mouth daily.    [name] aspirin EC 81 MG tablet Take 81 mg by mouth daily.    . LamoTRIgine (LAMICTAL ODT) 25 (21)-50 (7) MG KIT Take $Remove'100mg'ZjSGdGI$  in the morning and $RemoveBef'150mg'ihWGNSpnay$  at bedtime.    [name] loratadine (CLARITIN) 10 MG tablet Take 10 mg by mouth daily as needed.      No current facility-administered medications on file prior to visit.    BP 120/70 mmHg  Pulse 67  Temp(Src) 98.3 F (36.8 C) (Oral)  Resp 16  Ht $R'5\' 4"'LH$  (1.626 m)  Wt 161 lb 9.6 oz (73.301 kg)  BMI 27.72 kg/m2  SpO2 97%  LMP 12/25/2008       Objective:   Physical Exam  Constitutional: She is oriented to person, place, and time. She appears well-developed and well-nourished.    Cardiovascular: Normal rate, regular rhythm and normal heart sounds.   No murmur heard. Pulmonary/Chest: Effort normal and breath sounds normal. No respiratory distress. She has no wheezes.  Musculoskeletal: She exhibits no edema.  Neurological: She is alert and oriented to person, place, and time.  Psychiatric: She has a normal mood and affect. Her behavior is normal. Judgment and thought content normal.          Assessment & Plan:

## 2014-06-25 NOTE — Assessment & Plan Note (Signed)
Stable, continue metformin and diabetic diet. Obtain A1C, urine micro.

## 2014-06-27 ENCOUNTER — Telehealth: Payer: Self-pay | Admitting: Family

## 2014-06-27 DIAGNOSIS — E876 Hypokalemia: Secondary | ICD-10-CM

## 2014-06-27 MED ORDER — POTASSIUM CHLORIDE CRYS ER 20 MEQ PO TBCR
EXTENDED_RELEASE_TABLET | ORAL | Status: DC
Start: 2014-06-27 — End: 2014-09-27

## 2014-06-27 NOTE — Telephone Encounter (Signed)
Please contact patient and let her know that her potassium is low.  I would like her to start kdur 20 (take 2 tabs PO today) then one tab PO daily, repeat bmet in 1 week, dx hypokalemia. Sugar appears well controlled  There is a small amount of protein in her urine, likely related to hx of DM and HTN.  It is important that we continue to keep tight control of her diabetes and her blood pressure.

## 2014-06-29 NOTE — Telephone Encounter (Signed)
Left detailed message on cell# re: below recommendations and to call and schedule lab appt in 1 week. Also sent mychart message. Future lab order entered.

## 2014-07-09 ENCOUNTER — Encounter: Payer: Self-pay | Admitting: Family

## 2014-07-09 ENCOUNTER — Other Ambulatory Visit (INDEPENDENT_AMBULATORY_CARE_PROVIDER_SITE_OTHER): Payer: 59

## 2014-07-09 DIAGNOSIS — E876 Hypokalemia: Secondary | ICD-10-CM | POA: Diagnosis not present

## 2014-07-09 LAB — BASIC METABOLIC PANEL
BUN: 8 mg/dL (ref 6–23)
CO2: 28 mEq/L (ref 19–32)
Calcium: 10 mg/dL (ref 8.4–10.5)
Chloride: 103 mEq/L (ref 96–112)
Creatinine, Ser: 1 mg/dL (ref 0.40–1.20)
GFR: 73.72 mL/min (ref >60.00)
Glucose, Bld: 128 mg/dL — ABNORMAL HIGH (ref 70–99)
Potassium: 3.9 mEq/L (ref 3.5–5.1)
SODIUM: 139 meq/L (ref 135–145)

## 2014-07-23 ENCOUNTER — Encounter: Payer: Self-pay | Admitting: Family

## 2014-08-09 ENCOUNTER — Other Ambulatory Visit: Payer: Self-pay

## 2014-09-27 ENCOUNTER — Encounter: Payer: Self-pay | Admitting: Family

## 2014-09-27 ENCOUNTER — Ambulatory Visit (INDEPENDENT_AMBULATORY_CARE_PROVIDER_SITE_OTHER): Payer: 59 | Admitting: Family

## 2014-09-27 VITALS — BP 102/78 | HR 72 | Temp 98.3°F | Resp 16 | Ht 64.0 in | Wt 155.6 lb

## 2014-09-27 DIAGNOSIS — E785 Hyperlipidemia, unspecified: Secondary | ICD-10-CM

## 2014-09-27 DIAGNOSIS — L304 Erythema intertrigo: Secondary | ICD-10-CM | POA: Insufficient documentation

## 2014-09-27 DIAGNOSIS — E876 Hypokalemia: Secondary | ICD-10-CM

## 2014-09-27 DIAGNOSIS — F418 Other specified anxiety disorders: Secondary | ICD-10-CM | POA: Diagnosis not present

## 2014-09-27 DIAGNOSIS — E119 Type 2 diabetes mellitus without complications: Secondary | ICD-10-CM

## 2014-09-27 DIAGNOSIS — I10 Essential (primary) hypertension: Secondary | ICD-10-CM

## 2014-09-27 DIAGNOSIS — F32A Depression, unspecified: Secondary | ICD-10-CM

## 2014-09-27 DIAGNOSIS — F419 Anxiety disorder, unspecified: Secondary | ICD-10-CM

## 2014-09-27 DIAGNOSIS — F329 Major depressive disorder, single episode, unspecified: Secondary | ICD-10-CM

## 2014-09-27 LAB — LIPID PANEL
Cholesterol: 112 mg/dL (ref 0–200)
HDL: 31.8 mg/dL — AB (ref >39.00)
LDL Cholesterol: 58 mg/dL (ref 0–99)
NONHDL: 79.89
TRIGLYCERIDES: 108 mg/dL (ref 0.0–149.0)
Total CHOL/HDL Ratio: 4
VLDL: 21.6 mg/dL (ref 0.0–40.0)

## 2014-09-27 LAB — BASIC METABOLIC PANEL
BUN: 9 mg/dL (ref 6–23)
CHLORIDE: 103 meq/L (ref 96–112)
CO2: 31 mEq/L (ref 19–32)
Calcium: 10.1 mg/dL (ref 8.4–10.5)
Creatinine, Ser: 1.06 mg/dL (ref 0.40–1.20)
GFR: 68.87 mL/min (ref >60.00)
Glucose, Bld: 102 mg/dL — ABNORMAL HIGH (ref 70–99)
POTASSIUM: 3.4 meq/L — AB (ref 3.5–5.1)
SODIUM: 141 meq/L (ref 135–145)

## 2014-09-27 LAB — HEMOGLOBIN A1C: HEMOGLOBIN A1C: 5.7 % (ref 4.6–6.5)

## 2014-09-27 MED ORDER — AMLODIPINE BESYLATE-VALSARTAN 5-160 MG PO TABS: 1.0000 | ORAL_TABLET | Freq: Every day | ORAL | Status: DC

## 2014-09-27 MED ORDER — NYSTATIN 100000 UNIT/GM EX CREA: 1.0000 "application " | TOPICAL_CREAM | Freq: Two times a day (BID) | CUTANEOUS | Status: DC

## 2014-09-27 NOTE — Assessment & Plan Note (Signed)
L axilla, rx with nystatin cream.

## 2014-09-27 NOTE — Assessment & Plan Note (Signed)
BP a bit low today. Given intolerance to potassium supplement, will remove hctz.  Change exforge hct to plain exforge, stop Kdur, obtain bmet today. Repeat BP and BMET in 1 month at nurse visit.

## 2014-09-27 NOTE — Patient Instructions (Addendum)
Please complete lab work prior to leaving. Apply nystatin cream to left underarm twice daily until resolved then as needed.  Stop Exforge HCT and start Exforge. Stop Kdur (potassium supplement). Follow up in 1 month for blood pressure check (nurse visit) and lab work (BMET) Follow up in 3 months for routine office visit.

## 2014-09-27 NOTE — Assessment & Plan Note (Signed)
Stable, managed by psychiatry.  

## 2014-09-27 NOTE — Assessment & Plan Note (Signed)
Stable, obtain A1C. Consider d/c metformin if A1C is still <6 given weight loss.

## 2014-09-27 NOTE — Progress Notes (Signed)
Pre visit review using our clinic review tool, if applicable. No additional management support is needed unless otherwise documented below in the visit note. 

## 2014-09-27 NOTE — Assessment & Plan Note (Signed)
Obtain follow up flp, continue statin.

## 2014-09-27 NOTE — Progress Notes (Signed)
Subjective:    Patient ID: Kaitlyn Harper, female    DOB: 08-07-1960, 54 y.o.   MRN: [id number]  HPI  Kaitlyn Harper is a 54 yr old female who presents today for follow up.  She has been walking regularly and has lost weight- she is very pleased and reports feeling well.   Wt Readings from Last 3 Encounters:  09/27/14 155 lb 9.6 oz (70.58 kg)  06/25/14 161 lb 9.6 oz (73.301 kg)  03/09/14 177 lb 12.8 oz (80.65 kg)    DM2-  Lab Results  Component Value Date   HGBA1C 5.7 06/25/2014   HGBA1C 6.0 03/09/2014   HGBA1C 5.8 12/02/2013   Lab Results  Component Value Date   MICROALBUR 2.5* 06/25/2014   [place] 34 03/09/2014   CREATININE 1.00 07/09/2014   Hyperlipidemia-  LDL was at goal last January.  She is maintained on lipitor.  HTN- currently maintained on bystolic/exforge hct.  Reports trouble swallowing the Kdur so has not been taking regularly.   BP Readings from Last 3 Encounters:  09/27/14 102/78  06/25/14 120/70  03/09/14 108/70     Review of Systems See HPI  Past Medical History  Diagnosis Date  . Hypertension   . Hyperlipidemia   . Hyperglycemia   . Anemia   . Anxiety   . Depression     Social History   Social History  . Marital Status: Divorced    Spouse Name: N/A  . Number of Children: 3  . Years of Education: N/A   Occupational History  . RN SECRETARY    Social History Main Topics  . Smoking status: Former Smoker    Types: Cigarettes    Quit date: 02/19/2011  . Smokeless tobacco: Never Used     Comment: 10 cigarettes daily  . Alcohol Use: Yes     Comment: socially  . Drug Use: No  . Sexual Activity: Yes    Birth Control/ Protection: Post-menopausal   Other Topics Concern  . Not on file   Social History Narrative   Caffeine use:  2 drinks   Regular exercise: no          Past Surgical History  Procedure Laterality Date  . Cholecystectomy  1985  . Appendectomy  1985  . Tubal ligation  1985  . Knee surgery  1983   arthroscopy?  . Dilation and curettage of uterus  2005  . Polypectomy  2006    ?anal polyp?  [name] Hemorrhoid surgery  2002  . Tear duct probing  2004    Family History  Problem Relation Age of Onset  . Hypertension Father   . Sarcoidosis Brother   . Stroke Maternal Grandmother   . Alcohol abuse Brother   . Cirrhosis Brother     Allergies  Allergen Reactions  . Sulfonamide Derivatives Hives    Light sensitivity    Current Outpatient Prescriptions on File Prior to Visit  Medication Sig Dispense Refill  . ARIPiprazole (ABILIFY) 5 MG tablet Take 5 mg by mouth daily.    [name] aspirin EC 81 MG tablet Take 81 mg by mouth daily.    [name] atorvastatin (LIPITOR) 20 MG tablet Take 1 tablet (20 mg total) by mouth daily. 90 tablet 1  . BYSTOLIC 10 MG tablet TAKE 1 TABLET (10 MG TOTAL) BY MOUTH DAILY. 90 tablet 1  . LamoTRIgine (LAMICTAL ODT) 25 (21)-50 (7) MG KIT Take $Remove'100mg'yzDMLeS$  in the morning and $RemoveBef'150mg'zYqzgOwqVp$  at bedtime.    [name] loratadine (CLARITIN) 10  MG tablet Take 10 mg by mouth daily as needed.     . metFORMIN (GLUCOPHAGE) 500 MG tablet TAKE 1 TABLET (500 MG) BY MOUTH 2 TIMES DAILY WITH A MEAL. 180 tablet 1   No current facility-administered medications on file prior to visit.    BP 102/78 mmHg  Pulse 72  Temp(Src) 98.3 F (36.8 C) (Oral)  Resp 16  Ht $R'5\' 4"'ST$  (1.626 m)  Wt 155 lb 9.6 oz (70.58 kg)  BMI 26.70 kg/m2  SpO2 99%  LMP 12/25/2008       Objective:   Physical Exam  Constitutional: She is oriented to person, place, and time. She appears well-developed and well-nourished.  Cardiovascular: Normal rate, regular rhythm and normal heart sounds.   No murmur heard. Pulmonary/Chest: Effort normal and breath sounds normal. No respiratory distress. She has no wheezes.  Neurological: She is alert and oriented to person, place, and time.  Skin:  Hyperpigmented rash left axilla  Psychiatric: She has a normal mood and affect. Her behavior is normal. Judgment and thought content normal.           Assessment & Plan:  I commended the patient on her weight loss.

## 2014-09-28 ENCOUNTER — Encounter: Payer: Self-pay | Admitting: Family

## 2014-10-28 ENCOUNTER — Other Ambulatory Visit (INDEPENDENT_AMBULATORY_CARE_PROVIDER_SITE_OTHER): Payer: 59

## 2014-10-28 ENCOUNTER — Ambulatory Visit: Payer: 59 | Admitting: Family

## 2014-10-28 ENCOUNTER — Other Ambulatory Visit: Payer: Self-pay | Admitting: Family

## 2014-10-28 VITALS — BP 140/88

## 2014-10-28 DIAGNOSIS — I10 Essential (primary) hypertension: Secondary | ICD-10-CM

## 2014-10-28 DIAGNOSIS — E876 Hypokalemia: Secondary | ICD-10-CM

## 2014-10-28 LAB — BASIC METABOLIC PANEL
BUN: 10 mg/dL (ref 6–23)
CALCIUM: 9.6 mg/dL (ref 8.4–10.5)
CO2: 27 mEq/L (ref 19–32)
Chloride: 105 mEq/L (ref 96–112)
Creatinine, Ser: 0.99 mg/dL (ref 0.40–1.20)
GFR: 74.49 mL/min (ref >60.00)
GLUCOSE: 120 mg/dL — AB (ref 70–99)
Potassium: 3.3 mEq/L — ABNORMAL LOW (ref 3.5–5.1)
SODIUM: 142 meq/L (ref 135–145)

## 2014-10-28 NOTE — Progress Notes (Signed)
Patient presents for re-check on blood pressure. Patient was sitting with feet flat on floor.  First reading elevated. Rechecked and pressure had decreased.

## 2014-10-28 NOTE — Telephone Encounter (Signed)
Potassium is still low.  Add kdur 23meq once daily, repeat bmet in [redacted] week along with labs below.

## 2014-10-28 NOTE — Progress Notes (Signed)
BP Readings from Last 3 Encounters:  10/28/14 140/88  09/27/14 102/78  06/25/14 120/70   Noted. Continue exforge.

## 2014-10-29 NOTE — Telephone Encounter (Signed)
Notified pt and she voices understanding. States she still has some 22meQ potassium on hand and will restart it. Lab appt scheduled for 11/05/14 and lab order entered.

## 2014-11-05 ENCOUNTER — Other Ambulatory Visit (INDEPENDENT_AMBULATORY_CARE_PROVIDER_SITE_OTHER): Payer: 59

## 2014-11-05 DIAGNOSIS — E876 Hypokalemia: Secondary | ICD-10-CM | POA: Diagnosis not present

## 2014-11-05 LAB — BASIC METABOLIC PANEL
BUN: 8 mg/dL (ref 6–23)
CO2: 28 meq/L (ref 19–32)
Calcium: 9.7 mg/dL (ref 8.4–10.5)
Chloride: 105 mEq/L (ref 96–112)
Creatinine, Ser: 1.02 mg/dL (ref 0.40–1.20)
GFR: 71.97 mL/min (ref >60.00)
GLUCOSE: 147 mg/dL — AB (ref 70–99)
POTASSIUM: 3.9 meq/L (ref 3.5–5.1)
Sodium: 142 mEq/L (ref 135–145)

## 2014-11-08 ENCOUNTER — Encounter: Payer: Self-pay | Admitting: Family

## 2014-11-10 LAB — ALDOSTERONE + RENIN ACTIVITY W/ RATIO
Aldosterone: <1 ng/dL
PRA LC/MS/MS: 0.11 ng/mL/h — ABNORMAL LOW (ref 0.25–5.82)

## 2014-11-13 LAB — HM DIABETES EYE EXAM

## 2014-11-13 LAB — HM PAP SMEAR

## 2014-11-14 ENCOUNTER — Encounter: Payer: Self-pay | Admitting: Family

## 2014-12-23 DIAGNOSIS — R87610 Atypical squamous cells of undetermined significance on cytologic smear of cervix (ASC-US): Secondary | ICD-10-CM | POA: Insufficient documentation

## 2014-12-27 ENCOUNTER — Telehealth: Payer: Self-pay | Admitting: Family

## 2014-12-27 ENCOUNTER — Ambulatory Visit (INDEPENDENT_AMBULATORY_CARE_PROVIDER_SITE_OTHER): Payer: 59 | Admitting: Family

## 2014-12-27 ENCOUNTER — Encounter: Payer: Self-pay | Admitting: Family

## 2014-12-27 ENCOUNTER — Encounter: Payer: Self-pay | Admitting: Gastroenterology

## 2014-12-27 VITALS — BP 142/82 | HR 70 | Temp 98.1°F | Resp 16 | Ht 64.0 in | Wt 156.4 lb

## 2014-12-27 DIAGNOSIS — E1122 Type 2 diabetes mellitus with diabetic chronic kidney disease: Secondary | ICD-10-CM

## 2014-12-27 DIAGNOSIS — I10 Essential (primary) hypertension: Secondary | ICD-10-CM

## 2014-12-27 DIAGNOSIS — F418 Other specified anxiety disorders: Secondary | ICD-10-CM | POA: Diagnosis not present

## 2014-12-27 DIAGNOSIS — F419 Anxiety disorder, unspecified: Secondary | ICD-10-CM

## 2014-12-27 DIAGNOSIS — F32A Depression, unspecified: Secondary | ICD-10-CM

## 2014-12-27 DIAGNOSIS — Z86010 Personal history of colonic polyps: Secondary | ICD-10-CM

## 2014-12-27 DIAGNOSIS — F329 Major depressive disorder, single episode, unspecified: Secondary | ICD-10-CM

## 2014-12-27 LAB — BASIC METABOLIC PANEL
BUN: 9 mg/dL (ref 6–23)
CHLORIDE: 106 meq/L (ref 96–112)
CO2: 28 meq/L (ref 19–32)
Calcium: 9.9 mg/dL (ref 8.4–10.5)
Creatinine, Ser: 1.04 mg/dL (ref 0.40–1.20)
GFR: 70.33 mL/min (ref >60.00)
GLUCOSE: 86 mg/dL (ref 70–99)
POTASSIUM: 3.5 meq/L (ref 3.5–5.1)
SODIUM: 143 meq/L (ref 135–145)

## 2014-12-27 LAB — HEMOGLOBIN A1C: Hgb A1c MFr Bld: 5.5 % (ref 4.6–6.5)

## 2014-12-27 MED ORDER — AMLODIPINE BESYLATE-VALSARTAN 5-160 MG PO TABS: 1.0000 | ORAL_TABLET | Freq: Every day | ORAL | Status: DC

## 2014-12-27 MED ORDER — METFORMIN HCL 500 MG PO TABS: ORAL_TABLET | ORAL | Status: DC

## 2014-12-27 MED ORDER — ATORVASTATIN CALCIUM 20 MG PO TABS: 20.0000 mg | ORAL_TABLET | Freq: Every day | ORAL | Status: DC

## 2014-12-27 MED ORDER — BYSTOLIC 10 MG PO TABS: ORAL_TABLET | ORAL | Status: DC

## 2014-12-27 NOTE — Telephone Encounter (Signed)
Caller name: Shahed  Relationship to patient: Self  Can be reached: [phone]  Reason for call: per AVS pt is to schedule her CPE. Pt would like to have CPE before the year is out. However, not showing a CPE slot available. I scheduled pt on 12/12 at 3:45. Is this okay?

## 2014-12-27 NOTE — Progress Notes (Signed)
Pre visit review using our clinic review tool, if applicable. No additional management support is needed unless otherwise documented below in the visit note. 

## 2014-12-27 NOTE — Progress Notes (Signed)
Subjective:    Patient ID: Kaitlyn Harper, female    DOB: February 25, 1960, 54 y.o.   MRN: [id number]  HPI  HTN-  Currently maintained on exforge.   BP Readings from Last 3 Encounters:  12/27/14 142/82  10/28/14 140/88  09/27/14 102/78   DM2- reports + compliance with meds.   Lab Results  Component Value Date   HGBA1C 5.7 09/27/2014   HGBA1C 5.7 06/25/2014   HGBA1C 6.0 03/09/2014   Lab Results  Component Value Date   MICROALBUR 2.5* 06/25/2014   LDLCALC 58 09/27/2014   CREATININE 1.02 11/05/2014   Anxiety/Depression-  Pt is followed by psychiatry.  Reports mood is well controlled.    Review of Systems  Past Medical History  Diagnosis Date  . Hypertension   . Hyperlipidemia   . Hyperglycemia   . Anemia   . Anxiety   . Depression     Social History   Social History  . Marital Status: Divorced    Spouse Name: N/A  . Number of Children: 3  . Years of Education: N/A   Occupational History  . RN SECRETARY    Social History Main Topics  . Smoking status: Former Smoker    Types: Cigarettes    Quit date: 02/19/2011  . Smokeless tobacco: Never Used     Comment: 10 cigarettes daily  . Alcohol Use: Yes     Comment: socially  . Drug Use: No  . Sexual Activity: Yes    Birth Control/ Protection: Post-menopausal   Other Topics Concern  . Not on file   Social History Narrative   Caffeine use:  2 drinks   Regular exercise: no          Past Surgical History  Procedure Laterality Date  . Cholecystectomy  1985  . Appendectomy  1985  . Tubal ligation  1985  . Knee surgery  1983    arthroscopy?  . Dilation and curettage of uterus  2005  . Polypectomy  2006    ?anal polyp?  [name] Hemorrhoid surgery  2002  . Tear duct probing  2004    Family History  Problem Relation Age of Onset  . Hypertension Father   . Sarcoidosis Brother   . Stroke Maternal Grandmother   . Alcohol abuse Brother   . Cirrhosis Brother     Allergies  Allergen Reactions  .  Sulfonamide Derivatives Hives    Light sensitivity    Current Outpatient Prescriptions on File Prior to Visit  Medication Sig Dispense Refill  . amLODipine-valsartan (EXFORGE) 5-160 MG per tablet Take 1 tablet by mouth daily. 30 tablet 2  . ARIPiprazole (ABILIFY) 5 MG tablet Take 5 mg by mouth daily.    [name] aspirin EC 81 MG tablet Take 81 mg by mouth daily.    [name] atorvastatin (LIPITOR) 20 MG tablet Take 1 tablet (20 mg total) by mouth daily. 90 tablet 1  . BYSTOLIC 10 MG tablet TAKE 1 TABLET (10 MG TOTAL) BY MOUTH DAILY. 90 tablet 1  . loratadine (CLARITIN) 10 MG tablet Take 10 mg by mouth daily as needed.     . metFORMIN (GLUCOPHAGE) 500 MG tablet TAKE 1 TABLET (500 MG) BY MOUTH 2 TIMES DAILY WITH A MEAL. 180 tablet 1  . nystatin cream (MYCOSTATIN) Apply 1 application topically 2 (two) times daily. To left axilla 30 g 0  . potassium chloride SA (K-DUR,KLOR-CON) 20 MEQ tablet Take 20 mEq by mouth once.     No current facility-administered medications  on file prior to visit.    BP 142/82 mmHg  Pulse 70  Temp(Src) 98.1 F (36.7 C) (Oral)  Resp 16  Ht 5\' 4"  (1.626 m)  Wt 156 lb 6.4 oz (70.943 kg)  BMI 26.83 kg/m2  SpO2 100%  LMP 12/25/2008       Objective:   Physical Exam  Constitutional: She is oriented to person, place, and time. She appears well-developed and well-nourished.  HENT:  Head: Normocephalic and atraumatic.  Cardiovascular: Normal rate, regular rhythm and normal heart sounds.   No murmur heard. Pulmonary/Chest: Effort normal and breath sounds normal. No respiratory distress. She has no wheezes.  Musculoskeletal: She exhibits no edema.  Neurological: She is oriented to person, place, and time.  Psychiatric: She has a normal mood and affect. Her behavior is normal. Judgment and thought content normal.            Assessment & Plan:          Assessment & Plan:

## 2014-12-27 NOTE — Assessment & Plan Note (Signed)
Stable, managed by psychiatry.  

## 2014-12-27 NOTE — Assessment & Plan Note (Signed)
Fair BP on current meds.  Continue same, obtain bmet.

## 2014-12-27 NOTE — Patient Instructions (Addendum)
Please complete lab work prior to leaving.   

## 2014-12-27 NOTE — Assessment & Plan Note (Signed)
Lab Results  Component Value Date   HGBA1C 5.5 12/27/2014   A1C looks great with her weight loss, d/c metformin- see mychart message.

## 2014-12-28 ENCOUNTER — Ambulatory Visit: Payer: 59 | Admitting: Family

## 2014-12-28 NOTE — Telephone Encounter (Signed)
Okay.  Thanks.

## 2014-12-28 NOTE — Telephone Encounter (Signed)
That should be ok for now. Thanks.

## 2015-01-10 NOTE — Telephone Encounter (Signed)
Please contact pt re: unread mychart message. 

## 2015-01-11 NOTE — Telephone Encounter (Signed)
Mailed letter to pt

## 2015-01-12 ENCOUNTER — Other Ambulatory Visit: Payer: Self-pay

## 2015-01-12 DIAGNOSIS — Z1231 Encounter for screening mammogram for malignant neoplasm of breast: Secondary | ICD-10-CM

## 2015-01-24 ENCOUNTER — Ambulatory Visit (INDEPENDENT_AMBULATORY_CARE_PROVIDER_SITE_OTHER): Payer: 59 | Admitting: Family

## 2015-01-24 ENCOUNTER — Encounter: Payer: Self-pay | Admitting: Family

## 2015-01-24 VITALS — BP 164/88 | HR 83 | Temp 98.6°F | Resp 16 | Ht 64.0 in | Wt 155.4 lb

## 2015-01-24 DIAGNOSIS — Z0001 Encounter for general adult medical examination with abnormal findings: Secondary | ICD-10-CM

## 2015-01-24 DIAGNOSIS — Z0000 Encounter for general adult medical examination without abnormal findings: Secondary | ICD-10-CM | POA: Diagnosis not present

## 2015-01-24 DIAGNOSIS — I10 Essential (primary) hypertension: Secondary | ICD-10-CM | POA: Diagnosis not present

## 2015-01-24 DIAGNOSIS — E2839 Other primary ovarian failure: Secondary | ICD-10-CM

## 2015-01-24 MED ORDER — NYSTATIN 100000 UNIT/GM EX CREA: 1.0000 "application " | TOPICAL_CREAM | Freq: Two times a day (BID) | CUTANEOUS | Status: DC

## 2015-01-24 MED ORDER — NEBIVOLOL HCL 20 MG PO TABS: 1.0000 | ORAL_TABLET | Freq: Every day | ORAL | Status: DC

## 2015-01-24 NOTE — Assessment & Plan Note (Addendum)
Commended pt on healthy diet, exercise. Obtain routine lab work.  EKG today.  Send for dexa.

## 2015-01-24 NOTE — Progress Notes (Signed)
Pre visit review using our clinic review tool, if applicable. No additional management support is needed unless otherwise documented below in the visit note. 

## 2015-01-24 NOTE — Patient Instructions (Addendum)
Please complete lab work prior to leaving. Increase bystolic from 10mg  to 20mg . You will be contacted about scheduling your bone density.

## 2015-01-24 NOTE — Progress Notes (Addendum)
Subjective:    Patient ID: Kaitlyn Harper, female    DOB: 1960-12-13, 54 y.o.   MRN: [id number]  HPI  Patient presents today for complete physical.  Immunizations: tetanus up to date, flu shot up to date Diet: reports healthy- cutting down on sweets/sodas Exercise: she is walking  Colonoscopy: 2013, she will repeat in January. Dexa: due Pap Smear:10/16- normal Mammogram: will complete 1/17 Dental:  Up to date Vision:  Up to date went 10/16  HTN- stopped lasix due to hypokalemia.  She is  Continued on exforge.     Review of Systems  Constitutional: Negative for unexpected weight change.  HENT: Negative for hearing loss and rhinorrhea.   Eyes: Negative for visual disturbance.  Respiratory: Negative for shortness of breath.   Cardiovascular: Negative for leg swelling.  Gastrointestinal: Negative for diarrhea and constipation.  Genitourinary: Negative for dysuria, frequency and vaginal bleeding.  Musculoskeletal: Negative for arthralgias.       Notes occasional chest wall tenderness  Skin: Negative for rash.  Neurological: Negative for headaches.  Hematological: Negative for adenopathy.  Psychiatric/Behavioral:       Reports depression is well controlled   Past Medical History  Diagnosis Date  . Hypertension   . Hyperlipidemia   . Hyperglycemia   . Anemia   . Anxiety   . Depression     Social History   Social History  . Marital Status: Divorced    Spouse Name: N/A  . Number of Children: 3  . Years of Education: N/A   Occupational History  . RN SECRETARY    Social History Main Topics  . Smoking status: Former Smoker    Types: Cigarettes    Quit date: 02/19/2011  . Smokeless tobacco: Never Used     Comment: 10 cigarettes daily  . Alcohol Use: Yes     Comment: socially  . Drug Use: No  . Sexual Activity: Yes    Birth Control/ Protection: Post-menopausal   Other Topics Concern  . Not on file   Social History Narrative   Caffeine use:  2 drinks   Regular exercise: no          Past Surgical History  Procedure Laterality Date  . Cholecystectomy  1985  . Appendectomy  1985  . Tubal ligation  1985  . Knee surgery  1983    arthroscopy?  . Dilation and curettage of uterus  2005  . Polypectomy  2006    ?anal polyp?  [name] Hemorrhoid surgery  2002  . Tear duct probing  2004    Family History  Problem Relation Age of Onset  . Hypertension Father   . Sarcoidosis Brother   . Stroke Maternal Grandmother   . Alcohol abuse Brother   . Cirrhosis Brother     Allergies  Allergen Reactions  . Sulfonamide Derivatives Hives    Light sensitivity    Current Outpatient Prescriptions on File Prior to Visit  Medication Sig Dispense Refill  . amLODipine-valsartan (EXFORGE) 5-160 MG tablet Take 1 tablet by mouth daily. 90 tablet 1  . ARIPiprazole (ABILIFY) 5 MG tablet Take 5 mg by mouth daily.    [name] aspirin EC 81 MG tablet Take 81 mg by mouth daily.    [name] atorvastatin (LIPITOR) 20 MG tablet Take 1 tablet (20 mg total) by mouth daily. 90 tablet 1  . lamoTRIgine (LAMICTAL) 100 MG tablet Take 100 mg by mouth 2 (two) times daily.  0  . loratadine (CLARITIN) 10 MG tablet Take  10 mg by mouth daily as needed.     . nystatin cream (MYCOSTATIN) Apply 1 application topically 2 (two) times daily. To left axilla 30 g 0   No current facility-administered medications on file prior to visit.    BP 164/88 mmHg  Pulse 83  Temp(Src) 98.6 F (37 C) (Oral)  Resp 16  Ht 5\' 4"  (1.626 m)  Wt 155 lb 6.4 oz (70.489 kg)  BMI 26.66 kg/m2  SpO2 100%  LMP 12/25/2008       Objective:   Physical Exam  Physical Exam  Constitutional: She is oriented to person, place, and time. She appears well-developed and well-nourished. No distress.  HENT:  Head: Normocephalic and atraumatic.  Right Ear: Tympanic membrane and ear canal normal.  Left Ear: Tympanic membrane and ear canal normal.  Mouth/Throat: Oropharynx is clear and moist.  Eyes: Pupils are equal,  round, and reactive to light. No scleral icterus.  Neck: Normal range of motion. No thyromegaly present.  Cardiovascular: Normal rate and regular rhythm.   No murmur heard. Pulmonary/Chest: Effort normal and breath sounds normal. No respiratory distress. He has no wheezes. She has no rales. She exhibits no tenderness.  Abdominal: Soft. Bowel sounds are normal. He exhibits no distension and no mass. There is no tenderness. There is no rebound and no guarding.  Musculoskeletal: She exhibits no edema.  Lymphadenopathy:    She has no cervical adenopathy.  Neurological: She is alert and oriented to person, place, and time. She has normal reflexes. She exhibits normal muscle tone. Coordination normal.  Skin: Skin is warm and dry. fungal rash beneath both breasts Psychiatric: She has a normal mood and affect. Her behavior is normal. Judgment and thought content normal.  Breasts: Examined lying Right: Without masses, retractions, discharge or axillary adenopathy.  Left: Without masses, retractions, discharge or axillary adenopathy.     Assessment & Plan:         Assessment & Plan:  EKG is reviewed- notes sinus brady, appears unchanged compared to 2 prior ekg on file

## 2015-01-24 NOTE — Assessment & Plan Note (Signed)
BP up.  Increase bystolic from 10mg  to 20mg . Continue exforge.

## 2015-01-25 LAB — BASIC METABOLIC PANEL WITH GFR
BUN: 6 mg/dL (ref 6–23)
CO2: 29 meq/L (ref 19–32)
Calcium: 9.5 mg/dL (ref 8.4–10.5)
Chloride: 106 meq/L (ref 96–112)
Creatinine, Ser: 1.07 mg/dL (ref 0.40–1.20)
GFR: 68.04 mL/min
Glucose, Bld: 98 mg/dL (ref 70–99)
Potassium: 3.6 meq/L (ref 3.5–5.1)
Sodium: 143 meq/L (ref 135–145)

## 2015-01-25 LAB — TSH: TSH: 0.75 u[IU]/mL (ref 0.35–4.50)

## 2015-01-25 LAB — URINALYSIS, ROUTINE W REFLEX MICROSCOPIC
BILIRUBIN URINE: NEGATIVE
HGB URINE DIPSTICK: NEGATIVE
Ketones, ur: NEGATIVE
LEUKOCYTES UA: NEGATIVE
Nitrite: NEGATIVE
RBC / HPF: NONE SEEN (ref 0–?)
Specific Gravity, Urine: 1.02 (ref 1.000–1.030)
TOTAL PROTEIN, URINE-UPE24: NEGATIVE
URINE GLUCOSE: NEGATIVE
Urobilinogen, UA: 0.2 (ref 0.0–1.0)
pH: 6 (ref 5.0–8.0)

## 2015-01-25 LAB — HEPATIC FUNCTION PANEL
ALK PHOS: 92 U/L (ref 39–117)
ALT: 12 U/L (ref 0–35)
AST: 17 U/L (ref 0–37)
Albumin: 4.3 g/dL (ref 3.5–5.2)
Bilirubin, Direct: 0.1 mg/dL (ref 0.0–0.3)
TOTAL PROTEIN: 7.3 g/dL (ref 6.0–8.3)
Total Bilirubin: 0.4 mg/dL (ref 0.2–1.2)

## 2015-01-25 LAB — CBC WITH DIFFERENTIAL/PLATELET
BASOS PCT: 0.6 % (ref 0.0–3.0)
Basophils Absolute: 0 10*3/uL (ref 0.0–0.1)
Eosinophils Absolute: 0.1 10*3/uL (ref 0.0–0.7)
Eosinophils Relative: 0.7 % (ref 0.0–5.0)
HEMATOCRIT: 39.1 % (ref 36.0–46.0)
Hemoglobin: 12.8 g/dL (ref 12.0–15.0)
LYMPHS PCT: 42.4 % (ref 12.0–46.0)
Lymphs Abs: 3.3 10*3/uL (ref 0.7–4.0)
MCHC: 32.7 g/dL (ref 30.0–36.0)
MCV: 91.7 fl (ref 78.0–100.0)
MONOS PCT: 5.9 % (ref 3.0–12.0)
Monocytes Absolute: 0.5 10*3/uL (ref 0.1–1.0)
NEUTROS ABS: 4 10*3/uL (ref 1.4–7.7)
Neutrophils Relative %: 50.4 % (ref 43.0–77.0)
PLATELETS: 339 10*3/uL (ref 150.0–400.0)
RBC: 4.26 Mil/uL (ref 3.87–5.11)
RDW: 13.3 % (ref 11.5–15.5)
WBC: 7.9 10*3/uL (ref 4.0–10.5)

## 2015-01-25 LAB — LIPID PANEL
CHOL/HDL RATIO: 3
Cholesterol: 105 mg/dL (ref 0–200)
HDL: 31.9 mg/dL — AB (ref >39.00)
LDL Cholesterol: 55 mg/dL (ref 0–99)
NONHDL: 72.69
Triglycerides: 87 mg/dL (ref 0.0–149.0)
VLDL: 17.4 mg/dL (ref 0.0–40.0)

## 2015-01-25 NOTE — Addendum Note (Signed)
Addended by: Debbrah Alar on: 01/25/2015 10:54 PM   Modules accepted: Miquel Dunn

## 2015-02-08 ENCOUNTER — Ambulatory Visit (INDEPENDENT_AMBULATORY_CARE_PROVIDER_SITE_OTHER): Payer: 59 | Admitting: Family Medicine

## 2015-02-08 ENCOUNTER — Encounter: Payer: Self-pay | Admitting: Family Medicine

## 2015-02-08 VITALS — BP 150/80 | HR 74

## 2015-02-08 DIAGNOSIS — I10 Essential (primary) hypertension: Secondary | ICD-10-CM | POA: Diagnosis not present

## 2015-02-08 MED ORDER — AMLODIPINE BESYLATE-VALSARTAN 10-320 MG PO TABS: 1.0000 | ORAL_TABLET | Freq: Every day | ORAL | Status: DC

## 2015-02-08 NOTE — Patient Instructions (Signed)
Icnrease Exforge to to 1-320 mg daily and return to office in 2 wekks to see provider per Dr. Lizbeth Bark

## 2015-02-08 NOTE — Progress Notes (Deleted)
Patient ID: [name], female    DOB: 24-Oct-1960  Age: 54 y.o. MRN: [id number]    Subjective:  Subjective HPI Dorthy A Hostetler presents for bp check   Review of Systems  Constitutional: Negative for diaphoresis, appetite change, fatigue and unexpected weight change.  Eyes: Negative for pain, redness and visual disturbance.  Respiratory: Negative for cough, chest tightness, shortness of breath and wheezing.   Cardiovascular: Negative for chest pain, palpitations and leg swelling.  Endocrine: Negative for cold intolerance, heat intolerance, polydipsia, polyphagia and polyuria.  Genitourinary: Negative for dysuria, frequency and difficulty urinating.  Neurological: Negative for dizziness, light-headedness, numbness and headaches.    History Past Medical History  Diagnosis Date  . Hypertension   . Hyperlipidemia   . Hyperglycemia   . Anemia   . Anxiety   . Depression     She has past surgical history that includes Cholecystectomy (1985); Appendectomy (1985); Tubal ligation (1985); Knee surgery (1983); Dilation and curettage of uterus (2005); Polypectomy (2006); Hemorrhoid surgery (2002); and Tear duct probing (2004).   Her family history includes Alcohol abuse in her brother; Cirrhosis in her brother; Hypertension in her father; Sarcoidosis in her brother; Stroke in her maternal grandmother.She reports that she quit smoking about 3 years ago. Her smoking use included Cigarettes. She has never used smokeless tobacco. She reports that she drinks alcohol. She reports that she does not use illicit drugs.  Current Outpatient Prescriptions on File Prior to Visit  Medication Sig Dispense Refill  . ARIPiprazole (ABILIFY) 5 MG tablet Take 5 mg by mouth daily.    [name] aspirin EC 81 MG tablet Take 81 mg by mouth daily.    [name] atorvastatin (LIPITOR) 20 MG tablet Take 1 tablet (20 mg total) by mouth daily. 90 tablet 1  . lamoTRIgine (LAMICTAL) 100 MG tablet Take 100 mg by mouth 2 (two) times  daily.  0  . loratadine (CLARITIN) 10 MG tablet Take 10 mg by mouth daily as needed.     . Nebivolol HCl (BYSTOLIC) 20 MG TABS Take 1 tablet (20 mg total) by mouth daily. 30 tablet 3  . nystatin cream (MYCOSTATIN) Apply 1 application topically 2 (two) times daily. Beneath breasts 30 g 1   No current facility-administered medications on file prior to visit.     Objective:  Objective Physical Exam  Constitutional: She is oriented to person, place, and time. She appears well-developed and well-nourished.  HENT:  Head: Normocephalic and atraumatic.  Eyes: Conjunctivae and EOM are normal.  Neck: Normal range of motion. Neck supple. No JVD present. Carotid bruit is not present. No thyromegaly present.  Cardiovascular: Normal rate, regular rhythm and normal heart sounds.   No murmur heard. Pulmonary/Chest: Effort normal and breath sounds normal. No respiratory distress. She has no wheezes. She has no rales. She exhibits no tenderness.  Musculoskeletal: She exhibits no edema.  Neurological: She is alert and oriented to person, place, and time.  Psychiatric: She has a normal mood and affect. Her behavior is normal. Judgment and thought content normal.  Nursing note and vitals reviewed.  BP 150/80 mmHg  Pulse 74  LMP 12/25/2008 Wt Readings from Last 3 Encounters:  01/24/15 155 lb 6.4 oz (70.489 kg)  12/27/14 156 lb 6.4 oz (70.943 kg)  09/27/14 155 lb 9.6 oz (70.58 kg)     Lab Results  Component Value Date   WBC 7.9 01/24/2015   HGB 12.8 01/24/2015   HCT 39.1 01/24/2015   PLT 339.0 01/24/2015  GLUCOSE 98 01/24/2015   CHOL 105 01/24/2015   TRIG 87.0 01/24/2015   HDL 31.90* 01/24/2015   LDLCALC 55 01/24/2015   ALT 12 01/24/2015   AST 17 01/24/2015   NA 143 01/24/2015   K 3.6 01/24/2015   CL 106 01/24/2015   CREATININE 1.07 01/24/2015   BUN 6 01/24/2015   CO2 29 01/24/2015   TSH 0.75 01/24/2015   HGBA1C 5.5 12/27/2014   MICROALBUR 2.5* 06/25/2014    Mm Digital Diagnostic  Unilat R  01/13/2014  CLINICAL DATA:  Patient status post ultrasound-guided core needle biopsy of cyst within the right breast with mural nodule. EXAM: DIAGNOSTIC RIGHT MAMMOGRAM POST ULTRASOUND BIOPSY COMPARISON:  Previous exams FINDINGS: Mammographic images were obtained following ultrasound guided biopsy of cyst within the right breast with mural nodule. Ribbon shaped marking clip in appropriate position. IMPRESSION: Appropriate position ribbon shaped marking clip within the right breast status post ultrasound-guided core needle biopsy. Final Assessment: Post Procedure Mammograms for Marker Placement Electronically Signed   By: Lovey Newcomer M.D.   On: 01/13/2014 10:06   [place] Rt Breast Bx W Loc Dev 1st Lesion Img Bx Spec US Guide  01/14/2014  ADDENDUM REPORT: 01/14/2014 11:00 ADDENDUM: Pathology revealed fibrocystic changes in the right breast. This was found to be concordant by Dr. Lovey Newcomer. Pathology was discussed with the patient by telephone. She reported doing well after the biopsy. Post biopsy instructions and care were reviewed and her questions were answered. She was encouraged to call [facility] for any additional concerns. She was asked to return for annual screening mammography. Pathology results reported by Susa Raring RN, BSN on January 14, 2014. Electronically Signed   By: Lovey Newcomer M.D.   On: 01/14/2014 11:00   01/14/2014  CLINICAL DATA:  Patient for ultrasound-guided core needle biopsy of cyst within the right breast with a mural nodule. EXAM: ULTRASOUND GUIDED RIGHT BREAST CORE NEEDLE BIOPSY COMPARISON:  Previous exams. PROCEDURE: I met with the patient and we discussed the procedure of ultrasound-guided biopsy, including benefits and alternatives. We discussed the high likelihood of a successful procedure. We discussed the risks of the procedure including infection, bleeding, tissue injury, clip migration, and inadequate sampling. Informed written consent was  given. The usual time-out protocol was performed immediately prior to the procedure. Using sterile technique and 2% Lidocaine as local anesthetic, under direct ultrasound visualization, a 12 gauge vacuum-assisteddevice was used to perform biopsy of cyst with a mural nodule within the right breast 10 o'clock position using a lateral approach. At the conclusion of the procedure, a ribbon shaped tissue marker clip was deployed into the biopsy cavity. Follow-up 2-view mammogram was performed and dictated separately. IMPRESSION: Ultrasound-guided biopsy of cyst with mural nodule within the right breast, 10 o'clock position. No apparent complications. Electronically Signed: By: Lovey Newcomer M.D. On: 01/13/2014 09:59     Assessment & Plan:  Plan I have discontinued Ms. Deist's amLODipine-valsartan. I am also having her start on amLODipine-valsartan. Additionally, I am having her maintain her ARIPiprazole, loratadine, aspirin EC, lamoTRIgine, atorvastatin, Nebivolol HCl, and nystatin cream.  Meds ordered this encounter  Medications  . amLODipine-valsartan (EXFORGE) 10-320 MG tablet    Sig: Take 1 tablet by mouth daily.    Dispense:  30 tablet    Refill:  0    Problem List Items Addressed This Visit      Unprioritized   Essential hypertension - Primary   Relevant Medications   amLODipine-valsartan (EXFORGE) 10-320  MG tablet      Follow-up: Return in about 2 weeks (around 02/22/2015).  Garnet Koyanagi, DO

## 2015-02-08 NOTE — Progress Notes (Signed)
Subjective:    Patient ID: Kaitlyn Harper, female    DOB: 12/13/1960, 54 y.o.   MRN: [id number]  Chief Complaint  Patient presents with  . Blood Pressure Check    HPI Patient is in today for bp check only with LPN  Past Medical History  Diagnosis Date  . Hypertension   . Hyperlipidemia   . Hyperglycemia   . Anemia   . Anxiety   . Depression     Past Surgical History  Procedure Laterality Date  . Cholecystectomy  1985  . Appendectomy  1985  . Tubal ligation  1985  . Knee surgery  1983    arthroscopy?  . Dilation and curettage of uterus  2005  . Polypectomy  2006    ?anal polyp?  [name] Hemorrhoid surgery  2002  . Tear duct probing  2004    Family History  Problem Relation Age of Onset  . Hypertension Father   . Sarcoidosis Brother   . Stroke Maternal Grandmother   . Alcohol abuse Brother   . Cirrhosis Brother     Social History   Social History  . Marital Status: Divorced    Spouse Name: N/A  . Number of Children: 3  . Years of Education: N/A   Occupational History  . RN SECRETARY    Social History Main Topics  . Smoking status: Former Smoker    Types: Cigarettes    Quit date: 02/19/2011  . Smokeless tobacco: Never Used     Comment: 10 cigarettes daily  . Alcohol Use: Yes     Comment: socially  . Drug Use: No  . Sexual Activity: Yes    Birth Control/ Protection: Post-menopausal   Other Topics Concern  . Not on file   Social History Narrative   Caffeine use:  2 drinks   Regular exercise: no          Outpatient Prescriptions Prior to Visit  Medication Sig Dispense Refill  . ARIPiprazole (ABILIFY) 5 MG tablet Take 5 mg by mouth daily.    [name] aspirin EC 81 MG tablet Take 81 mg by mouth daily.    [name] atorvastatin (LIPITOR) 20 MG tablet Take 1 tablet (20 mg total) by mouth daily. 90 tablet 1  . lamoTRIgine (LAMICTAL) 100 MG tablet Take 100 mg by mouth 2 (two) times daily.  0  . loratadine (CLARITIN) 10 MG tablet Take 10 mg by mouth daily as  needed.     . Nebivolol HCl (BYSTOLIC) 20 MG TABS Take 1 tablet (20 mg total) by mouth daily. 30 tablet 3  . nystatin cream (MYCOSTATIN) Apply 1 application topically 2 (two) times daily. Beneath breasts 30 g 1  . amLODipine-valsartan (EXFORGE) 5-160 MG tablet Take 1 tablet by mouth daily. 90 tablet 1   No facility-administered medications prior to visit.    Allergies  Allergen Reactions  . Sulfonamide Derivatives Hives    Light sensitivity    ROS     Objective:    Physical Exam  BP 150/80 mmHg  Pulse 74  LMP 12/25/2008 Wt Readings from Last 3 Encounters:  01/24/15 155 lb 6.4 oz (70.489 kg)  12/27/14 156 lb 6.4 oz (70.943 kg)  09/27/14 155 lb 9.6 oz (70.58 kg)     Lab Results  Component Value Date   WBC 7.9 01/24/2015   HGB 12.8 01/24/2015   HCT 39.1 01/24/2015   PLT 339.0 01/24/2015   GLUCOSE 98 01/24/2015   CHOL 105 01/24/2015   TRIG 87.0  01/24/2015   HDL 31.90* 01/24/2015   LDLCALC 55 01/24/2015   ALT 12 01/24/2015   AST 17 01/24/2015   NA 143 01/24/2015   K 3.6 01/24/2015   CL 106 01/24/2015   CREATININE 1.07 01/24/2015   BUN 6 01/24/2015   CO2 29 01/24/2015   TSH 0.75 01/24/2015   HGBA1C 5.5 12/27/2014   MICROALBUR 2.5* 06/25/2014    Lab Results  Component Value Date   TSH 0.75 01/24/2015   Lab Results  Component Value Date   WBC 7.9 01/24/2015   HGB 12.8 01/24/2015   HCT 39.1 01/24/2015   MCV 91.7 01/24/2015   PLT 339.0 01/24/2015   Lab Results  Component Value Date   NA 143 01/24/2015   K 3.6 01/24/2015   CO2 29 01/24/2015   GLUCOSE 98 01/24/2015   BUN 6 01/24/2015   CREATININE 1.07 01/24/2015   BILITOT 0.4 01/24/2015   ALKPHOS 92 01/24/2015   AST 17 01/24/2015   ALT 12 01/24/2015   PROT 7.3 01/24/2015   ALBUMIN 4.3 01/24/2015   CALCIUM 9.5 01/24/2015   GFR 68.04 01/24/2015   Lab Results  Component Value Date   CHOL 105 01/24/2015   Lab Results  Component Value Date   HDL 31.90* 01/24/2015   Lab Results  Component  Value Date   LDLCALC 55 01/24/2015   Lab Results  Component Value Date   TRIG 87.0 01/24/2015   Lab Results  Component Value Date   CHOLHDL 3 01/24/2015   Lab Results  Component Value Date   HGBA1C 5.5 12/27/2014       Assessment & Plan:   Problem List Items Addressed This Visit      Unprioritized   Essential hypertension - Primary   Relevant Medications   amLODipine-valsartan (EXFORGE) 10-320 MG tablet    exforge increased--- f/u 2 weeks with Melissa  I have discontinued Ms. Shvartsman's amLODipine-valsartan. I am also having her start on amLODipine-valsartan. Additionally, I am having her maintain her ARIPiprazole, loratadine, aspirin EC, lamoTRIgine, atorvastatin, Nebivolol HCl, and nystatin cream.  Meds ordered this encounter  Medications  . amLODipine-valsartan (EXFORGE) 10-320 MG tablet    Sig: Take 1 tablet by mouth daily.    Dispense:  30 tablet    Refill:  0     Garnet Koyanagi, DO

## 2015-02-08 NOTE — Progress Notes (Signed)
Pre visit review using our clinic review tool, if applicable. No additional management support is needed unless otherwise documented below in the visit note.  Patient in for BP check Blood Pressure = (1) 188/81 Pulse 74 (2)150/80 Pulse 74 Per Dr. Etter Sjogren covering for M. O'Sullivan Increase Exforge to 10-320  mg daily and return for follow up OV in 2 weeks. Patient states she does neet to schedule appointment to see her provider because she needs muscle relaxer.  Appointment scheduled for 02/18/2015.

## 2015-02-16 ENCOUNTER — Ambulatory Visit (AMBULATORY_SURGERY_CENTER): Payer: Self-pay | Admitting: *Deleted

## 2015-02-16 ENCOUNTER — Ambulatory Visit
Admission: RE | Admit: 2015-02-16 | Discharge: 2015-02-16 | Disposition: A | Discharge status: Home / Self Care | Payer: 59 | Source: Ambulatory Visit

## 2015-02-16 VITALS — Ht 64.0 in | Wt 162.2 lb

## 2015-02-16 DIAGNOSIS — Z1231 Encounter for screening mammogram for malignant neoplasm of breast: Secondary | ICD-10-CM

## 2015-02-16 DIAGNOSIS — Z86010 Personal history of colonic polyps: Secondary | ICD-10-CM

## 2015-02-16 MED ORDER — NA SULFATE-K SULFATE-MG SULF 17.5-3.13-1.6 GM/177ML PO SOLN: 1.0000 | Freq: Once | ORAL | Status: DC

## 2015-02-16 NOTE — Progress Notes (Signed)
No egg or soy allergy known to patient  No issues with past sedation with any surgeries  or procedures, no intubation problems -last colon 2013 was told hard to wake  No diet pills per patient  No home 02 use per patient   emmi declined  [facility] patinet-sample given  Medication Samples have been provided to the patient.  Drug name: suprep   Qty: 1  LOTUZ:[id number]  Exp.Date: 11-18  The patient has been instructed regarding the correct time, dose, and frequency of taking this medication, including desired effects and most common side effects.   Hetty Ely Widener 4:34 PM 02/16/2015

## 2015-02-18 ENCOUNTER — Ambulatory Visit (INDEPENDENT_AMBULATORY_CARE_PROVIDER_SITE_OTHER): Payer: 59 | Admitting: Family

## 2015-02-18 ENCOUNTER — Encounter: Payer: Self-pay | Admitting: Family

## 2015-02-18 VITALS — BP 139/76 | HR 66 | Temp 98.5°F | Resp 18 | Ht 64.0 in | Wt 158.8 lb

## 2015-02-18 DIAGNOSIS — I10 Essential (primary) hypertension: Secondary | ICD-10-CM

## 2015-02-18 DIAGNOSIS — R319 Hematuria, unspecified: Secondary | ICD-10-CM

## 2015-02-18 DIAGNOSIS — N390 Urinary tract infection, site not specified: Secondary | ICD-10-CM

## 2015-02-18 DIAGNOSIS — R300 Dysuria: Secondary | ICD-10-CM

## 2015-02-18 LAB — POCT URINALYSIS DIPSTICK
GLUCOSE UA: NEGATIVE
Ketones, UA: NEGATIVE
NITRITE UA: NEGATIVE
Spec Grav, UA: 1.025
UROBILINOGEN UA: NEGATIVE
pH, UA: 5

## 2015-02-18 MED ORDER — AMLODIPINE BESYLATE-VALSARTAN 5-160 MG PO TABS: 1.0000 | ORAL_TABLET | Freq: Every day | ORAL | Status: DC

## 2015-02-18 MED ORDER — NITROFURANTOIN MONOHYD MACRO 100 MG PO CAPS: 100.0000 mg | ORAL_CAPSULE | Freq: Two times a day (BID) | ORAL | Status: DC

## 2015-02-18 MED FILL — NITROFURANTOIN MONO-MCR 100: 100 | 7 days supply | Qty: 14 | Fill #0

## 2015-02-18 NOTE — Progress Notes (Signed)
Subjective:    Patient ID: Kaitlyn Harper, female    DOB: 01-05-1961, 55 y.o.   MRN: [id number]  HPI  Kaitlyn Harper is a 55 yr old female who presents today for follow up.   She was seen 2 weeks ago and was instructed to increase exforge dose. She did not increase because she wanted to give the higher dose of bystolic a bit longer to work.  Instead.   BP Readings from Last 3 Encounters:  02/18/15 139/76  02/08/15 150/80  01/24/15 164/88   Dysuria- x 3 days.  Some urinary odor. No fever, + frequency.   Pt pulled a muscle lifting a spare tire.  Right anterior chest is tender.    Review of Systems    see HPI  Past Medical History  Diagnosis Date  . Hypertension   . Hyperlipidemia   . Hyperglycemia   . Anemia   . Anxiety   . Depression   . Diabetes mellitus without complication ([place])     type 2-diet controlled    Social History   Social History  . Marital Status: Divorced    Spouse Name: N/A  . Number of Children: 3  . Years of Education: N/A   Occupational History  . RN SECRETARY    Social History Main Topics  . Smoking status: Current Every Day Smoker -- 0.25 packs/day    Types: Cigarettes    Last Attempt to Quit: 02/19/2011  . Smokeless tobacco: Never Used     Comment: 10 cigarettes daily  . Alcohol Use: 0.0 oz/week    0 Standard drinks or equivalent per week     Comment: socially  . Drug Use: No  . Sexual Activity: Yes    Birth Control/ Protection: Post-menopausal   Other Topics Concern  . Not on file   Social History Narrative   Caffeine use:  2 drinks   Regular exercise: no          Past Surgical History  Procedure Laterality Date  . Cholecystectomy  1985  . Appendectomy  1985  . Tubal ligation  1985  . Knee surgery  1983    arthroscopy?  . Dilation and curettage of uterus  2005  . Hemorrhoid surgery  2002  . Tear duct probing  2004  . Colonoscopy  02-28-2011    4 polyps   . Polypectomy  2006    ?anal polyp?  . Polypectomy  02-28-2011     4 polyps- 3 of them TA     Family History  Problem Relation Age of Onset  . Hypertension Father   . Sarcoidosis Brother   . Stroke Maternal Grandmother   . Alcohol abuse Brother   . Cirrhosis Brother   . Colon cancer Neg Hx   . Colon polyps Neg Hx   . Esophageal cancer Neg Hx   . Rectal cancer Neg Hx   . Stomach cancer Neg Hx     Allergies  Allergen Reactions  . Sulfonamide Derivatives Hives    Light sensitivity    Current Outpatient Prescriptions on File Prior to Visit  Medication Sig Dispense Refill  . ARIPiprazole (ABILIFY) 5 MG tablet Take 5 mg by mouth daily.    [name] aspirin EC 81 MG tablet Take 81 mg by mouth daily.    [name] atorvastatin (LIPITOR) 20 MG tablet Take 1 tablet (20 mg total) by mouth daily. 90 tablet 1  . lamoTRIgine (LAMICTAL) 100 MG tablet Take 100 mg by mouth 2 (two) times  daily.  0  . loratadine (CLARITIN) 10 MG tablet Take 10 mg by mouth daily as needed.     . Na Sulfate-K Sulfate-Mg Sulf (SUPREP BOWEL PREP) SOLN Take 1 kit by mouth once. suprep as directed. No substitutions 354 mL 0  . Nebivolol HCl (BYSTOLIC) 20 MG TABS Take 1 tablet (20 mg total) by mouth daily. 30 tablet 3  . nystatin cream (MYCOSTATIN) Apply 1 application topically 2 (two) times daily. Beneath breasts 30 g 1   No current facility-administered medications on file prior to visit.    BP 139/76 mmHg  Pulse 66  Temp(Src) 98.5 F (36.9 C) (Oral)  Resp 18  Ht '5\' 4"'$  (1.626 m)  Wt 158 lb 12.8 oz (72.031 kg)  BMI 27.24 kg/m2  SpO2 100%  LMP 12/25/2008    Objective:   Physical Exam  Constitutional: She is oriented to person, place, and time. She appears well-developed and well-nourished.  HENT:  Head: Normocephalic and atraumatic.  Cardiovascular: Normal rate, regular rhythm and normal heart sounds.   No murmur heard. Pulmonary/Chest: Effort normal and breath sounds normal. No respiratory distress. She has no wheezes.  Musculoskeletal: She exhibits no edema.  Neurological: She  is alert and oriented to person, place, and time.  Psychiatric: She has a normal mood and affect. Her behavior is normal. Judgment and thought content normal.          Assessment & Plan:  Dysuria- symptoms and UA suggestive of UTI. Rx with macrodantin and send urine for culture.

## 2015-02-18 NOTE — Assessment & Plan Note (Signed)
BP looks ok on current dose of bystolic and previous dose exforge. OK to continue lower dose of exforge.

## 2015-02-18 NOTE — Patient Instructions (Signed)
Please start macrodantin (antibiotic) for urinary tract infection. You may use aleve 220mg  twice daily for the next few days for muscle pain- call if pain worsens or does not continue to improve. Continue current dose of bystolic and continue Exforge 5-160mg  as before.

## 2015-02-18 NOTE — Progress Notes (Signed)
Pre visit review using our clinic review tool, if applicable. No additional management support is needed unless otherwise documented below in the visit note. 

## 2015-02-20 LAB — URINE CULTURE: Colony Count: >=100000

## 2015-02-21 ENCOUNTER — Encounter: Payer: Self-pay | Admitting: Family

## 2015-03-01 ENCOUNTER — Ambulatory Visit (AMBULATORY_SURGERY_CENTER): Payer: 59 | Admitting: Gastroenterology

## 2015-03-01 ENCOUNTER — Encounter: Payer: Self-pay | Admitting: Gastroenterology

## 2015-03-01 VITALS — BP 109/73 | HR 51 | Temp 95.9°F | Resp 14 | Ht 64.0 in | Wt 162.0 lb

## 2015-03-01 DIAGNOSIS — D122 Benign neoplasm of ascending colon: Secondary | ICD-10-CM

## 2015-03-01 DIAGNOSIS — D124 Benign neoplasm of descending colon: Secondary | ICD-10-CM | POA: Diagnosis not present

## 2015-03-01 DIAGNOSIS — Z86010 Personal history of colonic polyps: Secondary | ICD-10-CM | POA: Diagnosis not present

## 2015-03-01 HISTORY — PX: COLONOSCOPY W/ POLYPECTOMY: SHX1380

## 2015-03-01 MED ORDER — SODIUM CHLORIDE 0.9 % IV SOLN: 500.0000 mL | INTRAVENOUS | Status: DC

## 2015-03-01 NOTE — Progress Notes (Signed)
A/ox3 pleased with MAC, report to Celia RN 

## 2015-03-01 NOTE — Op Note (Signed)
[place]  [address]. [place], [address]   COLONOSCOPY PROCEDURE REPORT  PATIENT: Kaitlyn Harper, Kaitlyn Harper  MR#: [id number] BIRTHDATE: 09-Nov-1960 , 54  yrs. old GENDER: female ENDOSCOPIST: Milus Banister, MD PROCEDURE DATE:  03/01/2015 PROCEDURE:   Colonoscopy, surveillance and Colonoscopy with snare polypectomy First Screening Colonoscopy - Avg.  risk and is 50 yrs.  old or older - No.  Prior Negative Screening - Now for repeat screening. N/A  History of Adenoma - Now for follow-up colonoscopy & has been > or = to 3 yrs.  Yes hx of adenoma.  Has been 3 or more years since last colonoscopy.  Polyps removed today? Yes ASA CLASS:   Class II INDICATIONS:Surveillance due to prior colonic neoplasia and Colonoscopy 2013 Dr.  Ardis Hughs found 4 polyps, three were small TAs.  MEDICATIONS: Monitored anesthesia care and Propofol 250 mg IV  DESCRIPTION OF PROCEDURE:   After the risks benefits and alternatives of the procedure were thoroughly explained, informed consent was obtained.  The digital rectal exam revealed no abnormalities of the rectum.   The LB [id number]  endoscope was introduced through the anus and advanced to the cecum, which was identified by both the appendix and ileocecal valve. No adverse events experienced.   The quality of the prep was excellent.  The instrument was then slowly withdrawn as the colon was fully examined. Estimated blood loss is zero unless otherwise noted in this procedure report.   COLON FINDINGS: Two sessile polyps ranging between 3-24mm in size were found in the descending colon and ascending colon. Polypectomies were performed with a cold snare.  The resection was complete, the polyp tissue was completely retrieved and sent to histology.   The examination was otherwise normal.  Retroflexed views revealed no abnormalities. The time to cecum = 6.2 Withdrawal time = 10.2   The scope was withdrawn and the procedure  completed. COMPLICATIONS: There were no immediate complications.  ENDOSCOPIC IMPRESSION: 1. Two sessile polyps ranging between 3-81mm in size were found in the descending colon and ascending colon; polypectomies were performed with a cold snare 2.   The examination was otherwise normal  RECOMMENDATIONS: If the polyp(s) removed today are proven to be adenomatous (pre-cancerous) polyps, you will need a repeat colonoscopy in 5 years.  You will receive a letter within 1-2 weeks with the results of your biopsy as well as final recommendations.  Please call my office if you have not received a letter after 3 weeks.  eSigned:  Milus Banister, MD 03/01/2015 7:59 AM

## 2015-03-01 NOTE — Patient Instructions (Signed)
Discharge instructions given. Handout on polyps. Resume previous medications. YOU HAD AN ENDOSCOPIC PROCEDURE TODAY AT THE [facility]:   Refer to the procedure report that was given to you for any specific questions about what was found during the examination.  If the procedure report does not answer your questions, please call your gastroenterologist to clarify.  If you requested that your care partner not be given the details of your procedure findings, then the procedure report has been included in a sealed envelope for you to review at your convenience later.  YOU SHOULD EXPECT: Some feelings of bloating in the abdomen. Passage of more gas than usual.  Walking can help get rid of the air that was put into your GI tract during the procedure and reduce the bloating. If you had a lower endoscopy (such as a colonoscopy or flexible sigmoidoscopy) you may notice spotting of blood in your stool or on the toilet paper. If you underwent a bowel prep for your procedure, you may not have a normal bowel movement for a few days.  Please Note:  You might notice some irritation and congestion in your nose or some drainage.  This is from the oxygen used during your procedure.  There is no need for concern and it should clear up in a day or so.  SYMPTOMS TO REPORT IMMEDIATELY:   Following lower endoscopy (colonoscopy or flexible sigmoidoscopy):  Excessive amounts of blood in the stool  Significant tenderness or worsening of abdominal pains  Swelling of the abdomen that is new, acute  Fever of 100F or higher   For urgent or emergent issues, a gastroenterologist can be reached at any hour by calling [phone].   DIET: Your first meal following the procedure should be a small meal and then it is ok to progress to your normal diet. Heavy or fried foods are harder to digest and may make you feel nauseous or bloated.  Likewise, meals heavy in dairy and vegetables can increase bloating.  Drink  plenty of fluids but you should avoid alcoholic beverages for 24 hours.  ACTIVITY:  You should plan to take it easy for the rest of today and you should NOT DRIVE or use heavy machinery until tomorrow (because of the sedation medicines used during the test).    FOLLOW UP: Our staff will call the number listed on your records the next business day following your procedure to check on you and address any questions or concerns that you may have regarding the information given to you following your procedure. If we do not reach you, we will leave a message.  However, if you are feeling well and you are not experiencing any problems, there is no need to return our call.  We will assume that you have returned to your regular daily activities without incident.  If any biopsies were taken you will be contacted by phone or by letter within the next 1-3 weeks.  Please call us at [phone] if you have not heard about the biopsies in 3 weeks.    SIGNATURES/CONFIDENTIALITY: You and/or your care partner have signed paperwork which will be entered into your electronic medical record.  These signatures attest to the fact that that the information above on your After Visit Summary has been reviewed and is understood.  Full responsibility of the confidentiality of this discharge information lies with you and/or your care-partner. 

## 2015-03-01 NOTE — Progress Notes (Signed)
Called to room to assist during endoscopic procedure.  Patient ID and intended procedure confirmed with present staff. Received instructions for my participation in the procedure from the performing physician.  

## 2015-03-02 ENCOUNTER — Telehealth: Payer: Self-pay

## 2015-03-02 NOTE — Telephone Encounter (Signed)
  Follow up Call-  Call back number 03/01/2015  Post procedure Call Back phone  # [phone]  Permission to leave phone message Yes     Patient questions:  Do you have a fever, pain , or abdominal swelling? No. Pain Score  0 *  Have you tolerated food without any problems? No.  Have you been able to return to your normal activities? Yes.    Do you have any questions about your discharge instructions: Diet   No. Medications  No. Follow up visit  No.  Do you have questions or concerns about your Care? No.  Actions: * If pain score is 4 or above: No action needed, pain <4.

## 2015-03-07 MED FILL — BYSTOLIC 10 MG TABLET: 10 | 30 days supply | Qty: 30 | Fill #0

## 2015-03-07 MED FILL — ATORVASTATIN 20 MG TABLET: 20 | 90 days supply | Qty: 90 | Fill #0

## 2015-03-09 ENCOUNTER — Encounter: Payer: Self-pay | Admitting: Gastroenterology

## 2015-03-28 MED FILL — BYSTOLIC 20 MG TABLET: 20 | 90 days supply | Qty: 90 | Fill #1

## 2015-04-06 MED FILL — AMLODIPINE-VALSARTAN 5-160: 5-160 | 90 days supply | Qty: 90 | Fill #1

## 2015-04-08 MED FILL — ARIPiprazole 5 MG TABS: 5 | 90 days supply | Qty: 135 | Fill #0

## 2015-04-14 MED FILL — lamoTRIgine 100 MG TABS: 100 | 90 days supply | Qty: 225 | Fill #0

## 2015-06-15 ENCOUNTER — Telehealth: Payer: Self-pay | Admitting: Family

## 2015-06-15 MED FILL — ATORVASTATIN 20 MG TABLET: 20 | 90 days supply | Qty: 90 | Fill #1

## 2015-06-15 NOTE — Telephone Encounter (Signed)
I would like for her to schedule an appointment please.

## 2015-06-15 NOTE — Telephone Encounter (Signed)
Pt says that she is in a program at work to stop smoking cigarettes. Pt would like to try Chantix to help. Pt would like to know if PCP could call her in a Rx for them or if she would need an appt. Pt has an appt scheduled for 6/19.    Pharmacy: [facility], [place]: [phone]

## 2015-06-15 NOTE — Telephone Encounter (Signed)
Notified pt and scheduled f/u for 06/20/15 at 5:30pm.

## 2015-06-20 ENCOUNTER — Ambulatory Visit (INDEPENDENT_AMBULATORY_CARE_PROVIDER_SITE_OTHER): Payer: 59 | Admitting: Family

## 2015-06-20 ENCOUNTER — Encounter: Payer: Self-pay | Admitting: Family

## 2015-06-20 VITALS — BP 140/80 | HR 68 | Temp 98.4°F | Resp 16 | Ht 64.0 in | Wt 164.4 lb

## 2015-06-20 DIAGNOSIS — Z720 Tobacco use: Secondary | ICD-10-CM

## 2015-06-20 DIAGNOSIS — Z87891 Personal history of nicotine dependence: Secondary | ICD-10-CM | POA: Insufficient documentation

## 2015-06-20 MED ORDER — NICOTINE 21 MG/24HR TD PT24: 21.0000 mg | MEDICATED_PATCH | Freq: Every day | TRANSDERMAL | Status: DC

## 2015-06-20 NOTE — Progress Notes (Signed)
Subjective:    Patient ID: [name], female    DOB: November 18, 1960, 55 y.o.   MRN: [id number]  HPI  Ms. Rockwell is a 55 yr old female who presents today to discuss tobacco abuse. She is inquiring re: chantix to help her quit smoking. Reports that she is smoking about 1 PPD.  Has tried the patch in the past  But admits that she didn't give it much of a chance. Reports that depression is well controlled. She is following with psychiatry- Sharalyn [phone].  Review of Systems See HPI  Past Medical History  Diagnosis Date  . Hypertension   . Hyperlipidemia   . Hyperglycemia   . Anemia   . Anxiety   . Depression   . Diabetes mellitus without complication ([place])     type 2-diet controlled     Social History   Social History  . Marital Status: Divorced    Spouse Name: N/A  . Number of Children: 3  . Years of Education: N/A   Occupational History  . RN SECRETARY    Social History Main Topics  . Smoking status: Current Every Day Smoker -- 0.25 packs/day    Types: Cigarettes    Last Attempt to Quit: 02/19/2011  . Smokeless tobacco: Never Used     Comment: 10 cigarettes daily  . Alcohol Use: 0.0 oz/week    0 Standard drinks or equivalent per week     Comment: socially  . Drug Use: No  . Sexual Activity: Yes    Birth Control/ Protection: Post-menopausal   Other Topics Concern  . Not on file   Social History Narrative   Caffeine use:  2 drinks   Regular exercise: no          Past Surgical History  Procedure Laterality Date  . Cholecystectomy  1985  . Appendectomy  1985  . Tubal ligation  1985  . Knee surgery  1983    arthroscopy?  . Dilation and curettage of uterus  2005  . Hemorrhoid surgery  2002  . Tear duct probing  2004  . Colonoscopy  02-28-2011    4 polyps   . Polypectomy  2006    ?anal polyp?  . Polypectomy  02-28-2011    4 polyps- 3 of them TA     Family History  Problem Relation Age of Onset  . Hypertension Father   . Sarcoidosis  Brother   . Stroke Maternal Grandmother   . Alcohol abuse Brother   . Cirrhosis Brother   . Colon cancer Neg Hx   . Colon polyps Neg Hx   . Esophageal cancer Neg Hx   . Rectal cancer Neg Hx   . Stomach cancer Neg Hx     Allergies  Allergen Reactions  . Sulfonamide Derivatives Hives    Light sensitivity    Current Outpatient Prescriptions on File Prior to Visit  Medication Sig Dispense Refill  . amLODipine-valsartan (EXFORGE) 5-160 MG tablet Take 1 tablet by mouth daily.    . ARIPiprazole (ABILIFY) 5 MG tablet Take 5 mg by mouth daily.    [name] aspirin EC 81 MG tablet Take 81 mg by mouth daily.    [name] atorvastatin (LIPITOR) 20 MG tablet Take 1 tablet (20 mg total) by mouth daily. 90 tablet 1  . lamoTRIgine (LAMICTAL) 100 MG tablet Take 100 mg by mouth 2 (two) times daily.  0  . loratadine (CLARITIN) 10 MG tablet Take 10 mg by mouth daily as needed.     [name]  Nebivolol HCl (BYSTOLIC) 20 MG TABS Take 1 tablet (20 mg total) by mouth daily. 30 tablet 3  . nystatin cream (MYCOSTATIN) Apply 1 application topically 2 (two) times daily. Beneath breasts 30 g 1   No current facility-administered medications on file prior to visit.    BP 140/80 mmHg  Pulse 68  Temp(Src) 98.4 F (36.9 C) (Oral)  Resp 16  Ht 5\' 4"  (1.626 m)  Wt 164 lb 6.4 oz (74.571 kg)  BMI 28.21 kg/m2  SpO2 100%  LMP 12/25/2008       Objective:   Physical Exam  Constitutional: She is oriented to person, place, and time. She appears well-developed and well-nourished. No distress.  Neurological: She is alert and oriented to person, place, and time.  Psychiatric: She has a normal mood and affect. Her behavior is normal. Judgment and thought content normal.          Assessment & Plan:

## 2015-06-20 NOTE — Assessment & Plan Note (Signed)
15 minutes spent counseling patient re: tobacco cessation. She reports that she is participating in a class to help her quit smoking.  Given her psychiatric hx, I would prefer that she retry the patch. If she is unsuccessful, then I would recommend that we discuss addition of zyban or chantix with her psychiatrist.

## 2015-06-20 NOTE — Progress Notes (Signed)
Pre visit review using our clinic review tool, if applicable. No additional management support is needed unless otherwise documented below in the visit note. 

## 2015-06-20 NOTE — Patient Instructions (Signed)
Set a quit date. Start nicoderm patch once daily.  (remove while you sleep) Follow up in 6 weeks so we can see how you are doing.

## 2015-06-21 MED FILL — NICOTINE 21 MG/24HR PATCH: 21 | 28 days supply | Qty: 28 | Fill #0

## 2015-06-28 ENCOUNTER — Other Ambulatory Visit: Payer: Self-pay | Admitting: Family

## 2015-06-28 MED FILL — BYSTOLIC 20 MG TABLET: 20 | 90 days supply | Qty: 90 | Fill #0

## 2015-06-28 NOTE — Telephone Encounter (Signed)
Refill sent per [facility] refill protocol/SLS  

## 2015-07-13 ENCOUNTER — Other Ambulatory Visit: Payer: Self-pay | Admitting: Family

## 2015-07-13 MED FILL — AMLODIPINE-VALSARTAN 5-160: 5-160 | 90 days supply | Qty: 90 | Fill #0

## 2015-07-21 DIAGNOSIS — F3181 Bipolar II disorder: Secondary | ICD-10-CM | POA: Diagnosis not present

## 2015-07-21 MED FILL — lamoTRIgine 150 MG TABS: 150 | 90 days supply | Qty: 90 | Fill #0

## 2015-07-26 ENCOUNTER — Ambulatory Visit: Payer: 59 | Admitting: Family

## 2015-08-01 ENCOUNTER — Ambulatory Visit (INDEPENDENT_AMBULATORY_CARE_PROVIDER_SITE_OTHER): Payer: 59 | Admitting: Family

## 2015-08-01 ENCOUNTER — Encounter: Payer: Self-pay | Admitting: Family

## 2015-08-01 VITALS — BP 130/78 | HR 71 | Temp 98.8°F | Resp 20 | Ht 64.0 in | Wt 162.4 lb

## 2015-08-01 DIAGNOSIS — E785 Hyperlipidemia, unspecified: Secondary | ICD-10-CM | POA: Diagnosis not present

## 2015-08-01 DIAGNOSIS — Z720 Tobacco use: Secondary | ICD-10-CM | POA: Diagnosis not present

## 2015-08-01 DIAGNOSIS — E119 Type 2 diabetes mellitus without complications: Secondary | ICD-10-CM

## 2015-08-01 DIAGNOSIS — I10 Essential (primary) hypertension: Secondary | ICD-10-CM | POA: Diagnosis not present

## 2015-08-01 MED ORDER — NICOTINE 14 MG/24HR TD PT24: 14.0000 mg | MEDICATED_PATCH | Freq: Every day | TRANSDERMAL | Status: DC

## 2015-08-01 MED ORDER — NICOTINE 7 MG/24HR TD PT24: 7.0000 mg | MEDICATED_PATCH | Freq: Every day | TRANSDERMAL | Status: DC

## 2015-08-01 NOTE — Assessment & Plan Note (Signed)
I encouraged her to keep up the great work. She will continue nicotine patch 21mg  x 2 more weeks, then 14mg  x 2 weeks then 7mg  x 2 weeks.

## 2015-08-01 NOTE — Assessment & Plan Note (Signed)
LDL at goal, we discussed adding exercise to raise HDL.

## 2015-08-01 NOTE — Assessment & Plan Note (Signed)
Controlled obtain follow up A1C.

## 2015-08-01 NOTE — Progress Notes (Signed)
Pre visit review using our clinic review tool, if applicable. No additional management support is needed unless otherwise documented below in the visit note. 

## 2015-08-01 NOTE — Assessment & Plan Note (Signed)
BP stable on exforge, continue same.

## 2015-08-01 NOTE — Progress Notes (Signed)
Subjective:    Patient ID: Kaitlyn Harper, female    DOB: 12/02/1960, 55 y.o.   MRN: [id number]  HPI  Kaitlyn Harper is a 55 yr old female who presents today for follow up.  1) HTN- maintained on exforge.  BP Readings from Last 3 Encounters:  08/01/15 130/78  06/20/15 140/80  03/01/15 109/73   2) Hyperlipidemia- on lipitor 20mg .  Lab Results  Component Value Date   CHOL 105 01/24/2015   HDL 31.90* 01/24/2015   LDLCALC 55 01/24/2015   TRIG 87.0 01/24/2015   CHOLHDL 3 01/24/2015   3) DM2- diet controlled.  Lab Results  Component Value Date   HGBA1C 5.5 12/27/2014   HGBA1C 5.7 09/27/2014   HGBA1C 5.7 06/25/2014   Lab Results  Component Value Date   MICROALBUR 2.5* 06/25/2014   LDLCALC 55 01/24/2015   CREATININE 1.07 01/24/2015   4) Nicotine Dependence- May 18th was last cigarette. Wearing patch.    Review of Systems  Respiratory: Negative for shortness of breath.   Cardiovascular: Negative for chest pain and leg swelling.       Past Medical History  Diagnosis Date  . Hypertension   . Hyperlipidemia   . Hyperglycemia   . Anemia   . Anxiety   . Depression   . Diabetes mellitus without complication ([place])     type 2-diet controlled     Social History   Social History  . Marital Status: Divorced    Spouse Name: N/A  . Number of Children: 3  . Years of Education: N/A   Occupational History  . RN SECRETARY    Social History Main Topics  . Smoking status: Former Smoker -- 0.25 packs/day    Types: Cigarettes    Quit date: 07/02/2015  . Smokeless tobacco: Never Used     Comment: 10 cigarettes daily  . Alcohol Use: 0.0 oz/week    0 Standard drinks or equivalent per week     Comment: socially  . Drug Use: No  . Sexual Activity: Yes    Birth Control/ Protection: Post-menopausal   Other Topics Concern  . Not on file   Social History Narrative   Caffeine use:  2 drinks   Regular exercise: no          Past Surgical History  Procedure Laterality  Date  . Cholecystectomy  1985  . Appendectomy  1985  . Tubal ligation  1985  . Knee surgery  1983    arthroscopy?  . Dilation and curettage of uterus  2005  . Hemorrhoid surgery  2002  . Tear duct probing  2004  . Colonoscopy  02-28-2011    4 polyps   . Polypectomy  2006    ?anal polyp?  . Polypectomy  02-28-2011    4 polyps- 3 of them TA     Family History  Problem Relation Age of Onset  . Hypertension Father   . Sarcoidosis Brother   . Stroke Maternal Grandmother   . Alcohol abuse Brother   . Cirrhosis Brother   . Colon cancer Neg Hx   . Colon polyps Neg Hx   . Esophageal cancer Neg Hx   . Rectal cancer Neg Hx   . Stomach cancer Neg Hx     Allergies  Allergen Reactions  . Sulfonamide Derivatives Hives    Light sensitivity    Current Outpatient Prescriptions on File Prior to Visit  Medication Sig Dispense Refill  . amLODipine-valsartan (EXFORGE) 5-160 MG tablet TAKE 1  TABLET BY MOUTH DAILY. 90 tablet 1  . ARIPiprazole (ABILIFY) 5 MG tablet Take 5 mg by mouth daily.    [name] aspirin EC 81 MG tablet Take 81 mg by mouth daily.    [name] atorvastatin (LIPITOR) 20 MG tablet Take 1 tablet (20 mg total) by mouth daily. 90 tablet 1  . BYSTOLIC 20 MG TABS TAKE 1 TABLET (20 MG TOTAL) BY MOUTH DAILY. 30 tablet 3  . loratadine (CLARITIN) 10 MG tablet Take 10 mg by mouth daily as needed.     . nystatin cream (MYCOSTATIN) Apply 1 application topically 2 (two) times daily. Beneath breasts 30 g 1   No current facility-administered medications on file prior to visit.    BP 130/78 mmHg  Pulse 71  Temp(Src) 98.8 F (37.1 C) (Oral)  Resp 20  Ht 5\' 4"  (1.626 m)  Wt 162 lb 6.4 oz (73.664 kg)  BMI 27.86 kg/m2  SpO2 100%  LMP 12/25/2008    Objective:   Physical Exam  Constitutional: She is oriented to person, place, and time. She appears well-developed and well-nourished.  HENT:  Head: Normocephalic and atraumatic.  Cardiovascular: Normal rate, regular rhythm and normal heart  sounds.   No murmur heard. Pulmonary/Chest: Effort normal and breath sounds normal. No respiratory distress. She has no wheezes.  Musculoskeletal: She exhibits no edema.  Lymphadenopathy:    She has no cervical adenopathy.  Neurological: She is alert and oriented to person, place, and time.  Psychiatric: She has a normal mood and affect. Her behavior is normal. Judgment and thought content normal.          Assessment & Plan:

## 2015-08-01 NOTE — Patient Instructions (Addendum)
Please schedule lab draw at the front desk.  Please continue nicotine patch 21mg  for 2 more weeks, then decrease to 14 mg for 2 more weeks then 7mg  for 2 more weeks.  Keep up the great work quitting smoking.

## 2015-08-02 ENCOUNTER — Other Ambulatory Visit: Payer: 59

## 2015-08-03 LAB — HEMOGLOBIN A1C: Hgb A1c MFr Bld: 5.8 % (ref 4.6–6.5)

## 2015-08-03 LAB — MICROALBUMIN / CREATININE URINE RATIO
Creatinine,U: 377.3 mg/dL
MICROALB UR: 1.8 mg/dL (ref 0.0–1.9)
Microalb Creat Ratio: 0.5 mg/g (ref 0.0–30.0)

## 2015-08-03 LAB — BASIC METABOLIC PANEL
BUN: 6 mg/dL (ref 6–23)
CHLORIDE: 105 meq/L (ref 96–112)
CO2: 26 meq/L (ref 19–32)
CREATININE: 1.06 mg/dL (ref 0.40–1.20)
Calcium: 9.7 mg/dL (ref 8.4–10.5)
GFR: 68.66 mL/min (ref >60.00)
GLUCOSE: 94 mg/dL (ref 70–99)
Potassium: 3.2 mEq/L — ABNORMAL LOW (ref 3.5–5.1)
Sodium: 141 mEq/L (ref 135–145)

## 2015-08-04 ENCOUNTER — Telehealth: Payer: Self-pay | Admitting: Family

## 2015-08-07 ENCOUNTER — Telehealth: Payer: Self-pay | Admitting: Family

## 2015-08-07 DIAGNOSIS — E876 Hypokalemia: Secondary | ICD-10-CM

## 2015-08-07 MED ORDER — POTASSIUM CHLORIDE CRYS ER 20 MEQ PO TBCR: EXTENDED_RELEASE_TABLET | ORAL | Status: DC

## 2015-08-07 NOTE — Telephone Encounter (Signed)
Sugar looks great, kidney function is normal. Potassium is low.  I would like her to take Kdur 16meQ 2 tabs by mouth today, then repeat her bmet in 1 week.  Also focus on eating high potassium foods.    High potassium content foods  Highest content (>25 meq/100 g) High content (>6.2 meq/100 g)   Vegetables   Spinach   Tomatoes   Broccoli   Winter squash   Beets   Carrots   Cauliflower   Potatoes   Fruits   Bananas  Dried figs Cantaloupe  Molasses Kiwis  Seaweed Oranges  Very high content (>12.5 meq/100 g) Mangos  Dried fruits (dates, prunes) Meats  Nuts Ground beef  Avocados Steak  Bran cereals Pork  Wheat germ Veal  Lima beans Arline Asp

## 2015-08-09 NOTE — Telephone Encounter (Signed)
Notified pt and she voices understanding. Lab appt scheduled for 08/17/15 at 1:30pm. Order entered.

## 2015-08-09 NOTE — Telephone Encounter (Signed)
Attempted to reach pt. Home voicemail is full, not currently at work #.

## 2015-08-10 NOTE — Telephone Encounter (Signed)
Opened in error

## 2015-08-11 ENCOUNTER — Telehealth: Payer: Self-pay | Admitting: *Deleted

## 2015-08-11 MED ORDER — POTASSIUM CHLORIDE 20 MEQ/15ML (10%) PO SOLN: 20.0000 meq | Freq: Every day | ORAL | Status: DC

## 2015-08-11 MED FILL — POTASSIUM CL 10% (20 MEQ/15: 20 MEQ/15ML | 60 days supply | Qty: 900 | Fill #0

## 2015-08-11 NOTE — Telephone Encounter (Signed)
Pt is requesting liquid form of potassium 65meq.  Please advise?

## 2015-08-12 MED FILL — NICOTINE 14 MG/24HR PATCH: 14 | 14 days supply | Qty: 14 | Fill #0

## 2015-08-17 ENCOUNTER — Other Ambulatory Visit (INDEPENDENT_AMBULATORY_CARE_PROVIDER_SITE_OTHER): Payer: 59

## 2015-08-17 DIAGNOSIS — F3181 Bipolar II disorder: Secondary | ICD-10-CM | POA: Diagnosis not present

## 2015-08-17 DIAGNOSIS — E876 Hypokalemia: Secondary | ICD-10-CM | POA: Diagnosis not present

## 2015-08-17 LAB — BASIC METABOLIC PANEL
BUN: 6 mg/dL (ref 6–23)
CHLORIDE: 106 meq/L (ref 96–112)
CO2: 30 mEq/L (ref 19–32)
CREATININE: 1.06 mg/dL (ref 0.40–1.20)
Calcium: 9.3 mg/dL (ref 8.4–10.5)
GFR: 68.65 mL/min (ref >60.00)
GLUCOSE: 116 mg/dL — AB (ref 70–99)
POTASSIUM: 4.1 meq/L (ref 3.5–5.1)
Sodium: 141 mEq/L (ref 135–145)

## 2015-08-25 ENCOUNTER — Telehealth: Payer: Self-pay | Admitting: Family

## 2015-08-25 NOTE — Telephone Encounter (Signed)
Please try patient back tomorrow.

## 2015-08-25 NOTE — Telephone Encounter (Signed)
Patient did pick medication up. I called pharmacy.

## 2015-08-25 NOTE — Telephone Encounter (Signed)
Called pt, no answer. Voicemail full, unable to leave message.  

## 2015-08-25 NOTE — Telephone Encounter (Addendum)
Called patient unable to leave message. Called pharmacy stated patient did pick up medication.

## 2015-08-25 NOTE — Telephone Encounter (Signed)
Please let pt know that I sent rx to her pharmacy for potassium liquid to replace the potassium tablets.

## 2015-08-29 MED FILL — ARIPiprazole 10 MG TABS: 10 | 30 days supply | Qty: 30 | Fill #0

## 2015-08-29 MED FILL — NICOTINE 7 MG/24HR PATCH: 7 | 14 days supply | Qty: 14 | Fill #0

## 2015-09-21 ENCOUNTER — Other Ambulatory Visit: Payer: Self-pay | Admitting: Family

## 2015-09-21 DIAGNOSIS — F3181 Bipolar II disorder: Secondary | ICD-10-CM | POA: Diagnosis not present

## 2015-09-21 MED FILL — traZODone HCL 50 MG TABS: 50 | 30 days supply | Qty: 30 | Fill #0

## 2015-09-21 MED FILL — lamoTRIgine 100 MG TABS: 100 | 30 days supply | Qty: 75 | Fill #0

## 2015-09-21 MED FILL — ARIPiprazole 10 MG TABS: 10 | 30 days supply | Qty: 30 | Fill #0

## 2015-09-21 MED FILL — ATORVASTATIN 20 MG TABLET: 20 | 90 days supply | Qty: 90 | Fill #0

## 2015-10-06 MED FILL — BYSTOLIC 20 MG TABLET: 20 | 30 days supply | Qty: 30 | Fill #1

## 2015-10-20 MED FILL — AMLODIPINE-VALSARTAN 5-160: 5-160 | 90 days supply | Qty: 90 | Fill #1

## 2015-10-20 MED FILL — traZODone HCL 50 MG TABS: 50 | 30 days supply | Qty: 30 | Fill #1

## 2015-11-01 MED FILL — lamoTRIgine 100 MG TABS: 100 | 30 days supply | Qty: 75 | Fill #1

## 2015-11-01 MED FILL — ARIPiprazole 10 MG TABS: 10 | 30 days supply | Qty: 30 | Fill #1

## 2015-11-08 ENCOUNTER — Other Ambulatory Visit: Payer: Self-pay | Admitting: Family

## 2015-11-08 MED FILL — BYSTOLIC 20 MG TABLET: 20 | 90 days supply | Qty: 90 | Fill #0

## 2015-11-23 ENCOUNTER — Other Ambulatory Visit: Payer: Self-pay | Admitting: Family

## 2015-11-23 MED FILL — traZODone HCL 50 MG TABS: 50 | 30 days supply | Qty: 30 | Fill #2

## 2015-11-24 DIAGNOSIS — F3181 Bipolar II disorder: Secondary | ICD-10-CM | POA: Diagnosis not present

## 2015-11-24 MED FILL — AMLODIPINE-VALSARTAN 5-160: 5-160 | 30 days supply | Qty: 30 | Fill #0

## 2015-11-29 MED FILL — lamoTRIgine 150 MG TABS: 150 | 30 days supply | Qty: 30 | Fill #0

## 2015-11-29 MED FILL — ARIPiprazole 10 MG TABS: 10 | 30 days supply | Qty: 30 | Fill #2

## 2015-12-12 DIAGNOSIS — H524 Presbyopia: Secondary | ICD-10-CM | POA: Diagnosis not present

## 2015-12-26 MED FILL — AMLODIPINE-VALSARTAN 5-160: 5-160 | 30 days supply | Qty: 30 | Fill #1

## 2015-12-26 MED FILL — ATORVASTATIN 20 MG TABLET: 20 | 90 days supply | Qty: 90 | Fill #1

## 2015-12-26 MED FILL — traZODone HCL 50 MG TABS: 50 | 30 days supply | Qty: 60 | Fill #0

## 2015-12-26 MED FILL — lamoTRIgine 150 MG TABS: 150 | 30 days supply | Qty: 30 | Fill #1

## 2016-01-06 MED FILL — ARIPiprazole 10 MG TABS: 10 | 30 days supply | Qty: 30 | Fill #0

## 2016-01-24 MED FILL — lamoTRIgine 100 MG TABS: 100 | 30 days supply | Qty: 30 | Fill #0

## 2016-01-24 MED FILL — AMLODIPINE-VALSARTAN 5-160: 5-160 | 30 days supply | Qty: 30 | Fill #2

## 2016-02-03 MED FILL — lamoTRIgine 150 MG TABS: 150 | 30 days supply | Qty: 30 | Fill #2

## 2016-02-03 MED FILL — ARIPiprazole 10 MG TABS: 10 | 30 days supply | Qty: 30 | Fill #1

## 2016-02-03 MED FILL — BYSTOLIC 20 MG TABLET: 20 | 30 days supply | Qty: 30 | Fill #1

## 2016-02-13 DIAGNOSIS — I214 Non-ST elevation (NSTEMI) myocardial infarction: Secondary | ICD-10-CM

## 2016-02-13 HISTORY — DX: Non-ST elevation (NSTEMI) myocardial infarction: I21.4

## 2016-02-21 MED FILL — AMLODIPINE-VALSARTAN 5-160: 5-160 | 30 days supply | Qty: 30 | Fill #3

## 2016-02-21 MED FILL — traZODone HCL 50 MG TABS: 50 | 30 days supply | Qty: 60 | Fill #1

## 2016-02-21 MED FILL — lamoTRIgine 100 MG TABS: 100 | 30 days supply | Qty: 30 | Fill #1

## 2016-03-01 MED FILL — ARIPiprazole 10 MG TABS: 10 | 30 days supply | Qty: 30 | Fill #2

## 2016-03-05 MED FILL — lamoTRIgine 150 MG TABS: 150 | 30 days supply | Qty: 30 | Fill #0

## 2016-03-08 HISTORY — PX: MOUTH SURGERY: SHX715

## 2016-03-08 MED FILL — NAPROXEN SODIUM 550 MG TAB: 550 | 6 days supply | Qty: 12 | Fill #0

## 2016-03-08 MED FILL — CHLORHEXIDINE 0.12% RINSE: 0.12 | 30 days supply | Qty: 473 | Fill #0

## 2016-03-09 ENCOUNTER — Other Ambulatory Visit: Payer: Self-pay | Admitting: Family

## 2016-03-09 NOTE — Telephone Encounter (Signed)
30 Day supply of bystolic sent to pharmacy. Pt is due for follow up. Please call pt to schedule f/u with  Melissa soon. Thanks!

## 2016-03-11 NOTE — Telephone Encounter (Signed)
See above. Thanks

## 2016-03-12 MED FILL — BYSTOLIC 20 MG TABLET: 20 | 30 days supply | Qty: 30 | Fill #0

## 2016-03-14 NOTE — Telephone Encounter (Signed)
Informed pt that a f/u is needed. She said that she will call back to schedule.

## 2016-03-18 ENCOUNTER — Inpatient Hospital Stay (HOSPITAL_COMMUNITY): Payer: 59

## 2016-03-18 ENCOUNTER — Inpatient Hospital Stay (HOSPITAL_BASED_OUTPATIENT_CLINIC_OR_DEPARTMENT_OTHER)
Admission: EM | Admit: 2016-03-18 | Discharge: 2016-03-20 | DRG: 282 | Disposition: A | Discharge status: Home / Self Care | Payer: 59 | Attending: Internal Medicine | Admitting: Internal Medicine

## 2016-03-18 ENCOUNTER — Encounter (HOSPITAL_BASED_OUTPATIENT_CLINIC_OR_DEPARTMENT_OTHER): Payer: Self-pay | Admitting: *Deleted

## 2016-03-18 DIAGNOSIS — R7989 Other specified abnormal findings of blood chemistry: Secondary | ICD-10-CM | POA: Diagnosis not present

## 2016-03-18 DIAGNOSIS — R55 Syncope and collapse: Secondary | ICD-10-CM | POA: Diagnosis present

## 2016-03-18 DIAGNOSIS — I10 Essential (primary) hypertension: Secondary | ICD-10-CM | POA: Diagnosis not present

## 2016-03-18 DIAGNOSIS — Z882 Allergy status to sulfonamides status: Secondary | ICD-10-CM | POA: Diagnosis not present

## 2016-03-18 DIAGNOSIS — F329 Major depressive disorder, single episode, unspecified: Secondary | ICD-10-CM | POA: Diagnosis present

## 2016-03-18 DIAGNOSIS — Z9049 Acquired absence of other specified parts of digestive tract: Secondary | ICD-10-CM

## 2016-03-18 DIAGNOSIS — E785 Hyperlipidemia, unspecified: Secondary | ICD-10-CM | POA: Diagnosis present

## 2016-03-18 DIAGNOSIS — F32A Depression, unspecified: Secondary | ICD-10-CM | POA: Diagnosis present

## 2016-03-18 DIAGNOSIS — I440 Atrioventricular block, first degree: Secondary | ICD-10-CM | POA: Diagnosis present

## 2016-03-18 DIAGNOSIS — Z811 Family history of alcohol abuse and dependence: Secondary | ICD-10-CM | POA: Diagnosis not present

## 2016-03-18 DIAGNOSIS — I214 Non-ST elevation (NSTEMI) myocardial infarction: Secondary | ICD-10-CM | POA: Diagnosis present

## 2016-03-18 DIAGNOSIS — E876 Hypokalemia: Secondary | ICD-10-CM | POA: Diagnosis present

## 2016-03-18 DIAGNOSIS — F419 Anxiety disorder, unspecified: Secondary | ICD-10-CM | POA: Diagnosis present

## 2016-03-18 DIAGNOSIS — Z8249 Family history of ischemic heart disease and other diseases of the circulatory system: Secondary | ICD-10-CM | POA: Diagnosis not present

## 2016-03-18 DIAGNOSIS — I21A1 Myocardial infarction type 2: Secondary | ICD-10-CM | POA: Diagnosis present

## 2016-03-18 DIAGNOSIS — R748 Abnormal levels of other serum enzymes: Secondary | ICD-10-CM | POA: Diagnosis not present

## 2016-03-18 DIAGNOSIS — E1122 Type 2 diabetes mellitus with diabetic chronic kidney disease: Secondary | ICD-10-CM

## 2016-03-18 DIAGNOSIS — F418 Other specified anxiety disorders: Secondary | ICD-10-CM

## 2016-03-18 DIAGNOSIS — R0602 Shortness of breath: Secondary | ICD-10-CM

## 2016-03-18 DIAGNOSIS — Z7982 Long term (current) use of aspirin: Secondary | ICD-10-CM

## 2016-03-18 DIAGNOSIS — R778 Other specified abnormalities of plasma proteins: Secondary | ICD-10-CM | POA: Diagnosis present

## 2016-03-18 DIAGNOSIS — E119 Type 2 diabetes mellitus without complications: Secondary | ICD-10-CM | POA: Diagnosis present

## 2016-03-18 DIAGNOSIS — Z823 Family history of stroke: Secondary | ICD-10-CM

## 2016-03-18 DIAGNOSIS — Z87891 Personal history of nicotine dependence: Secondary | ICD-10-CM

## 2016-03-18 HISTORY — DX: Syncope and collapse: R55

## 2016-03-18 LAB — BASIC METABOLIC PANEL
Anion gap: 9 (ref 5–15)
BUN: 10 mg/dL (ref 6–20)
CALCIUM: 9.2 mg/dL (ref 8.9–10.3)
CHLORIDE: 105 mmol/L (ref 101–111)
CO2: 26 mmol/L (ref 22–32)
CREATININE: 1.12 mg/dL — AB (ref 0.44–1.00)
GFR calc Af Amer: >60 mL/min (ref >60)
GFR calc non Af Amer: 53 mL/min — ABNORMAL LOW (ref >60)
GLUCOSE: 112 mg/dL — AB (ref 65–99)
Potassium: 3.1 mmol/L — ABNORMAL LOW (ref 3.5–5.1)
Sodium: 140 mmol/L (ref 135–145)

## 2016-03-18 LAB — MAGNESIUM: MAGNESIUM: 1.9 mg/dL (ref 1.7–2.4)

## 2016-03-18 LAB — CBC
HCT: 38.4 % (ref 36.0–46.0)
HEMOGLOBIN: 12.8 g/dL (ref 12.0–15.0)
MCH: 30.6 pg (ref 26.0–34.0)
MCHC: 33.3 g/dL (ref 30.0–36.0)
MCV: 91.9 fL (ref 78.0–100.0)
Platelets: 334 10*3/uL (ref 150–400)
RBC: 4.18 MIL/uL (ref 3.87–5.11)
RDW: 13.3 % (ref 11.5–15.5)
WBC: 6.9 10*3/uL (ref 4.0–10.5)

## 2016-03-18 LAB — URINALYSIS, MICROSCOPIC (REFLEX)

## 2016-03-18 LAB — URINALYSIS, ROUTINE W REFLEX MICROSCOPIC
BILIRUBIN URINE: NEGATIVE
GLUCOSE, UA: NEGATIVE mg/dL
Hgb urine dipstick: NEGATIVE
Ketones, ur: NEGATIVE mg/dL
Leukocytes, UA: NEGATIVE
Nitrite: NEGATIVE
Protein, ur: 30 mg/dL — AB
SPECIFIC GRAVITY, URINE: 1.008 (ref 1.005–1.030)
pH: 7.5 (ref 5.0–8.0)

## 2016-03-18 LAB — TROPONIN I
TROPONIN I: 1.25 ng/mL — AB (ref <0.03)
Troponin I: 0.29 ng/mL (ref <0.03)

## 2016-03-18 LAB — D-DIMER, QUANTITATIVE (NOT AT ARMC): D DIMER QUANT: 0.64 ug{FEU}/mL — AB (ref 0.00–0.50)

## 2016-03-18 MED ORDER — ARIPIPRAZOLE 5 MG PO TABS
5.0000 mg | ORAL_TABLET | Freq: Every day | ORAL | Status: DC
  Administered 2016-03-19 – 2016-03-20 (×2): 5 mg via ORAL
  Filled 2016-03-18 (×4): qty 1

## 2016-03-18 MED ORDER — LAMOTRIGINE 25 MG PO TABS
150.0000 mg | ORAL_TABLET | Freq: Every day | ORAL | Status: DC
  Administered 2016-03-19: 150 mg via ORAL
  Filled 2016-03-18: qty 6

## 2016-03-18 MED ORDER — ASPIRIN 81 MG PO CHEW
324.0000 mg | CHEWABLE_TABLET | Freq: Once | ORAL | Status: AC
  Administered 2016-03-18: 324 mg via ORAL
  Filled 2016-03-18: qty 4

## 2016-03-18 MED ORDER — ASPIRIN EC 81 MG PO TBEC: 81.0000 mg | DELAYED_RELEASE_TABLET | Freq: Every day | ORAL | Status: DC

## 2016-03-18 MED ORDER — IRBESARTAN 150 MG PO TABS
150.0000 mg | ORAL_TABLET | Freq: Every day | ORAL | Status: DC
  Administered 2016-03-19 – 2016-03-20 (×3): 150 mg via ORAL
  Filled 2016-03-18 (×3): qty 1

## 2016-03-18 MED ORDER — POTASSIUM CHLORIDE 20 MEQ/15ML (10%) PO SOLN
20.0000 meq | Freq: Every day | ORAL | Status: DC
Start: 2016-03-18 — End: 2016-03-20
  Administered 2016-03-19 – 2016-03-20 (×3): 20 meq via ORAL
  Filled 2016-03-18 (×3): qty 15

## 2016-03-18 MED ORDER — AMLODIPINE BESYLATE-VALSARTAN 5-160 MG PO TABS: 1.0000 | ORAL_TABLET | Freq: Every day | ORAL | Status: DC

## 2016-03-18 MED ORDER — ENOXAPARIN SODIUM 40 MG/0.4ML ~~LOC~~ SOLN: 40.0000 mg | SUBCUTANEOUS | Status: DC

## 2016-03-18 MED ORDER — ACETAMINOPHEN 325 MG PO TABS: 650.0000 mg | ORAL_TABLET | Freq: Four times a day (QID) | ORAL | Status: DC | PRN

## 2016-03-18 MED ORDER — NEBIVOLOL HCL 10 MG PO TABS
20.0000 mg | ORAL_TABLET | Freq: Every day | ORAL | Status: DC
  Administered 2016-03-19 – 2016-03-20 (×3): 20 mg via ORAL
  Filled 2016-03-18 (×3): qty 2

## 2016-03-18 MED ORDER — ATORVASTATIN CALCIUM 20 MG PO TABS
20.0000 mg | ORAL_TABLET | Freq: Every day | ORAL | Status: DC
  Administered 2016-03-19 – 2016-03-20 (×3): 20 mg via ORAL
  Filled 2016-03-18 (×3): qty 1

## 2016-03-18 MED ORDER — SODIUM CHLORIDE 0.9% FLUSH
3.0000 mL | Freq: Two times a day (BID) | INTRAVENOUS | Status: DC
  Administered 2016-03-19: 3 mL via INTRAVENOUS

## 2016-03-18 MED ORDER — ACETAMINOPHEN 650 MG RE SUPP: 650.0000 mg | Freq: Four times a day (QID) | RECTAL | Status: DC | PRN

## 2016-03-18 MED ORDER — POTASSIUM CHLORIDE CRYS ER 20 MEQ PO TBCR
40.0000 meq | EXTENDED_RELEASE_TABLET | Freq: Once | ORAL | Status: AC
  Administered 2016-03-18: 40 meq via ORAL
  Filled 2016-03-18: qty 2

## 2016-03-18 MED ORDER — AMLODIPINE BESYLATE 5 MG PO TABS
5.0000 mg | ORAL_TABLET | Freq: Every day | ORAL | Status: DC
  Administered 2016-03-19 – 2016-03-20 (×3): 5 mg via ORAL
  Filled 2016-03-18 (×3): qty 1
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
## 2016-03-18 NOTE — ED Notes (Signed)
Troponin 0.29 MD aware.

## 2016-03-18 NOTE — ED Provider Notes (Addendum)
[place] DEPT [facility] Provider Note   CSN: [id number] Arrival date & time: 03/18/16  1652  By signing my name below, I, Kaitlyn Harper, attest that this documentation has been prepared under the direction and in the presence of Orlie Dakin, MD. Electronically Signed: Judithe Harper, ER Scribe. 09/24/2015. 6:22 PM.  History   Chief Complaint Chief Complaint  Patient presents with  . Near Syncope   The history is provided by the patient. No language interpreter was used.    HPI Comments: Kaitlyn Harper is a 56 y.o. female who presents to the Emergency Department complaining of a syncope two hours ago after climbing three slights of stairs. She endorses associated SOBWhile walking up the steps. She was lost consciousness for one minute per her son who witnessed the event. She denies CP, vision changes or dizziness. She has had SOB after climbing stairs intermittently in the past. She had no SOB yesterday after climbing the same stairs. She has a PMHx of HTN, HLD, depression and anxiety. She is a former smoker and quit smoking one year ago. She does not exercise. Presently asymptomatic without treatment. Brought by EMS in no other associated symptoms.  Past Medical History:  Diagnosis Date  . Anemia   . Anxiety   . Depression   . Diabetes mellitus without complication ([place])    type 2-diet controlled  . Hyperglycemia   . Hyperlipidemia   . Hypertension     Patient Active Problem List   Diagnosis Date Noted  . Tobacco abuse 06/20/2015  . Intertrigo 09/27/2014  . Left shoulder pain 12/14/2013  . Seasonal allergies 05/23/2011  . Hot flashes, menopausal 01/29/2011  . General medical examination 01/29/2011  . Weight gain 12/26/2010  . Vitamin D deficiency 12/26/2010  . Hyperlipidemia 12/26/2010  . Diabetes type 2, controlled ([place]) 12/26/2010  . Anxiety and depression 08/19/2008  . Essential hypertension 08/19/2008  . IRRITABLE BOWEL SYNDROME 08/19/2008    Past  Surgical History:  Procedure Laterality Date  . APPENDECTOMY  1985  . CHOLECYSTECTOMY  1985  . COLONOSCOPY  02-28-2011   4 polyps   . DILATION AND CURETTAGE OF UTERUS  2005  . [place]  2002  . KNEE SURGERY  1983   arthroscopy?  [name] MOUTH SURGERY  03/08/2016  . POLYPECTOMY  2006   ?anal polyp?  [name] POLYPECTOMY  02-28-2011   4 polyps- 3 of them TA   . TEAR DUCT PROBING  2004  . TUBAL LIGATION  1985    OB History    Gravida Para Term Preterm AB Living   3 3           SAB TAB Ectopic Multiple Live Births                   Home Medications    Prior to Admission medications   Medication Sig Start Date End Date Taking? Authorizing Provider  amLODipine-valsartan (EXFORGE) 5-160 MG tablet TAKE 1 TABLET BY MOUTH DAILY. 11/24/15  Yes Debbrah Alar, NP  ARIPiprazole (ABILIFY) 5 MG tablet Take 5 mg by mouth daily.   Yes Historical Provider, MD  aspirin EC 81 MG tablet Take 81 mg by mouth daily.   Yes Historical Provider, MD  atorvastatin (LIPITOR) 20 MG tablet TAKE 1 TABLET BY MOUTH DAILY 09/21/15  Yes Debbrah Alar, NP  BYSTOLIC 20 MG TABS TAKE 1 TABLET (20 MG TOTAL) BY MOUTH DAILY. 03/09/16  Yes Debbrah Alar, NP  lamoTRIgine (LAMICTAL) 150 MG tablet Take 150  mg by mouth daily. 07/21/15  Yes Historical Provider, MD  loratadine (CLARITIN) 10 MG tablet Take 10 mg by mouth daily as needed.    Yes Historical Provider, MD  nystatin cream (MYCOSTATIN) Apply 1 application topically 2 (two) times daily. Beneath breasts 01/24/15  Yes Debbrah Alar, NP  nicotine (NICODERM CQ - DOSED IN MG/24 HOURS) 14 mg/24hr patch Place 1 patch (14 mg total) onto the skin daily. 08/01/15   Debbrah Alar, NP  nicotine (NICODERM CQ) 7 mg/24hr patch Place 1 patch (7 mg total) onto the skin daily. 08/01/15   Debbrah Alar, NP  potassium chloride 20 MEQ/15ML (10%) SOLN Take 15 mLs (20 mEq total) by mouth daily. 08/11/15   Debbrah Alar, NP    Family History Family History    Problem Relation Age of Onset  . Hypertension Father   . Sarcoidosis Brother   . Alcohol abuse Brother   . Cirrhosis Brother   . Stroke Maternal Grandmother   . Colon cancer Neg Hx   . Colon polyps Neg Hx   . Esophageal cancer Neg Hx   . Rectal cancer Neg Hx   . Stomach cancer Neg Hx     Social History Social History  Substance Use Topics  . Smoking status: Former Smoker    Packs/day: 0.25    Types: Cigarettes    Quit date: 07/02/2015  . Smokeless tobacco: Never Used     Comment: 10 cigarettes daily  . Alcohol use 0.0 oz/week     Comment: socially  No illicit drug use   Allergies   Sulfonamide derivatives   Review of Systems Review of Systems  Constitutional: Negative.   HENT: Negative.   Respiratory: Positive for shortness of breath.   Cardiovascular: Negative.   Gastrointestinal: Negative.   Musculoskeletal: Negative.   Skin: Negative.   Neurological: Positive for syncope.  Psychiatric/Behavioral: Negative.   All other systems reviewed and are negative.  A complete 10 system review of systems was obtained and all systems are negative except as noted in the HPI and PMH.   Physical Exam Updated Vital Signs BP 139/90   Pulse 73   Temp 98.3 F (36.8 C) (Oral)   Resp 14   Ht 5\' 4"  (1.626 m)   Wt 173 lb (78.5 kg)   LMP 12/25/2008   SpO2 100%   BMI 29.70 kg/m   Physical Exam  Constitutional: She is oriented to person, place, and time. She appears well-developed and well-nourished.  HENT:  Head: Normocephalic and atraumatic.  Eyes: Pupils are equal, round, and reactive to light.  Neck: Neck supple.  Cardiovascular: Normal rate, regular rhythm, normal heart sounds and intact distal pulses.   Pulmonary/Chest: Effort normal. No respiratory distress.  Musculoskeletal: Normal range of motion.  Neurological: She is alert and oriented to person, place, and time. Coordination normal.  Skin: Skin is warm and dry. She is not diaphoretic.  Psychiatric: She has a  normal mood and affect. Her behavior is normal.  Nursing note and vitals reviewed.    ED Treatments / Results  DIAGNOSTIC STUDIES: Oxygen Saturation is 100% on RA, normal by my interpretation.    COORDINATION OF CARE: 6:21 PM Discussed treatment plan with pt at bedside and pt agreed to plan.  Labs (all labs ordered are listed, but only abnormal results are displayed) Labs Reviewed  BASIC METABOLIC PANEL - Abnormal; Notable for the following:       Result Value   Potassium 3.1 (*)    Glucose, Bld 112 (*)  Creatinine, Ser 1.12 (*)    GFR calc non Af Amer 53 (*)    All other components within normal limits  URINALYSIS, ROUTINE W REFLEX MICROSCOPIC - Abnormal; Notable for the following:    Protein, ur 30 (*)    All other components within normal limits  URINALYSIS, MICROSCOPIC (REFLEX) - Abnormal; Notable for the following:    Bacteria, UA RARE (*)    Squamous Epithelial / LPF 0-5 (*)    All other components within normal limits  CBC   Results for orders placed or performed during the hospital encounter of [id number]  Basic metabolic panel  Result Value Ref Range   Sodium 140 135 - 145 mmol/L   Potassium 3.1 (L) 3.5 - 5.1 mmol/L   Chloride 105 101 - 111 mmol/L   CO2 26 22 - 32 mmol/L   Glucose, Bld 112 (H) 65 - 99 mg/dL   BUN 10 6 - 20 mg/dL   Creatinine, Ser 1.12 (H) 0.44 - 1.00 mg/dL   Calcium 9.2 8.9 - 10.3 mg/dL   GFR calc non Af Amer 53 (L) >60 mL/min   GFR calc Af Amer >60 >60 mL/min   Anion gap 9 5 - 15  CBC  Result Value Ref Range   WBC 6.9 4.0 - 10.5 K/uL   RBC 4.18 3.87 - 5.11 MIL/uL   Hemoglobin 12.8 12.0 - 15.0 g/dL   HCT 38.4 36.0 - 46.0 %   MCV 91.9 78.0 - 100.0 fL   MCH 30.6 26.0 - 34.0 pg   MCHC 33.3 30.0 - 36.0 g/dL   RDW 13.3 11.5 - 15.5 %   Platelets 334 150 - 400 K/uL  Urinalysis, Routine w reflex microscopic  Result Value Ref Range   Color, Urine YELLOW YELLOW   APPearance CLEAR CLEAR   Specific Gravity, Urine 1.008 1.005 - 1.030   pH 7.5  5.0 - 8.0   Glucose, UA NEGATIVE NEGATIVE mg/dL   Hgb urine dipstick NEGATIVE NEGATIVE   Bilirubin Urine NEGATIVE NEGATIVE   Ketones, ur NEGATIVE NEGATIVE mg/dL   Protein, ur 30 (A) NEGATIVE mg/dL   Nitrite NEGATIVE NEGATIVE   Leukocytes, UA NEGATIVE NEGATIVE  Urinalysis, Microscopic (reflex)  Result Value Ref Range   RBC / HPF 0-5 0 - 5 RBC/hpf   WBC, UA 0-5 0 - 5 WBC/hpf   Bacteria, UA RARE (A) NONE SEEN   Squamous Epithelial / LPF 0-5 (A) NONE SEEN  Troponin I  Result Value Ref Range   Troponin I 0.29 (HH) <0.03 ng/mL   No results found. EKG  EKG Interpretation None     ED ECG REPORT   Date: 03/18/2016  Rate: 80  Rhythm: normal sinus rhythm  QRS Axis: left  Intervals: normal  ST/T Wave abnormalities: nonspecific T wave changes  Conduction Disutrbances:first-degree A-V block   Narrative Interpretation:   Old EKG Reviewed: Rate increased over previous tracing  I have personally reviewed the EKG tracing and agree with the computerized printout as noted.  Radiology No results found.  Procedures Procedures (including critical care time)  Medications Ordered in ED Medications  potassium chloride SA (K-DUR,KLOR-CON) CR tablet 40 mEq (not administered)   7:35 PM patient remains asymptomatic. Resting comfortably. Dr.J. Phillis Knack cone consulted. Plan transfer to [facility]. 23 hour observation. Telemetry. Elevated troponin with dyspnea on exertion may be secondary to anginal equivalent.Aspirin ordered She received oral potassium supplementation while here Initial Impression / Assessment and Plan / ED Course  I have reviewed the triage  vital signs and the nursing notes.  Pertinent labs & imaging results that were available during my care of the patient were reviewed by me and considered in my medical decision making (see chart for details).    Results for orders placed or performed during the hospital encounter of [id number]  Basic metabolic panel  Result Value Ref  Range   Sodium 140 135 - 145 mmol/L   Potassium 3.1 (L) 3.5 - 5.1 mmol/L   Chloride 105 101 - 111 mmol/L   CO2 26 22 - 32 mmol/L   Glucose, Bld 112 (H) 65 - 99 mg/dL   BUN 10 6 - 20 mg/dL   Creatinine, Ser 1.12 (H) 0.44 - 1.00 mg/dL   Calcium 9.2 8.9 - 10.3 mg/dL   GFR calc non Af Amer 53 (L) >60 mL/min   GFR calc Af Amer >60 >60 mL/min   Anion gap 9 5 - 15  CBC  Result Value Ref Range   WBC 6.9 4.0 - 10.5 K/uL   RBC 4.18 3.87 - 5.11 MIL/uL   Hemoglobin 12.8 12.0 - 15.0 g/dL   HCT 38.4 36.0 - 46.0 %   MCV 91.9 78.0 - 100.0 fL   MCH 30.6 26.0 - 34.0 pg   MCHC 33.3 30.0 - 36.0 g/dL   RDW 13.3 11.5 - 15.5 %   Platelets 334 150 - 400 K/uL  Urinalysis, Routine w reflex microscopic  Result Value Ref Range   Color, Urine YELLOW YELLOW   APPearance CLEAR CLEAR   Specific Gravity, Urine 1.008 1.005 - 1.030   pH 7.5 5.0 - 8.0   Glucose, UA NEGATIVE NEGATIVE mg/dL   Hgb urine dipstick NEGATIVE NEGATIVE   Bilirubin Urine NEGATIVE NEGATIVE   Ketones, ur NEGATIVE NEGATIVE mg/dL   Protein, ur 30 (A) NEGATIVE mg/dL   Nitrite NEGATIVE NEGATIVE   Leukocytes, UA NEGATIVE NEGATIVE  Urinalysis, Microscopic (reflex)  Result Value Ref Range   RBC / HPF 0-5 0 - 5 RBC/hpf   WBC, UA 0-5 0 - 5 WBC/hpf   Bacteria, UA RARE (A) NONE SEEN   Squamous Epithelial / LPF 0-5 (A) NONE SEEN  Troponin I  Result Value Ref Range   Troponin I 0.29 (HH) <0.03 ng/mL   No results found.   Final Clinical Impressions(s) / ED Diagnoses  Dx #1 syncope #2 hypokalemia Final diagnoses:  None    New Prescriptions New Prescriptions   No medications on file    I personally performed the services described in this documentation, which was scribed in my presence. The recorded information has been reviewed and considered.       Orlie Dakin, MD 03/18/16 1941    Orlie Dakin, MD 03/18/16 [id number]

## 2016-03-18 NOTE — Progress Notes (Signed)
Called by Dr. Winfred Leeds from [facility] regarding this patient.  She had syncope lasting approximately 1 minute after climbing the stairs this evening.  History of climbing those stairs daily with no problem.  She was also SOB.  In the ER, found to have elevated troponin to 0.29 without apparent chest pain or EKG changes.  Based on the [facility] Syncope Rule, the patient is at high risk for serious complication based on her accompanying SOB and so should be admitted for syncope evaluation.  She will also needing trending of the troponin.  Accepted for observation to telemetry.  Carlyon Shadow, M.D.

## 2016-03-18 NOTE — ED Notes (Signed)
Fall risk bracelet and grip socks applied.

## 2016-03-18 NOTE — ED Notes (Signed)
Pt on automatic VS and cardiac monitor 

## 2016-03-18 NOTE — ED Triage Notes (Signed)
Per EMS -- pt felt hot around 1550 and had a near syncopal episode. Denies LOC. CBG 145 per EMS. Pt reports feeling a little light-headed at this time with minimal sob (pt able to speak in complete sentences). Denies chest pain, vision changes, dizziness.

## 2016-03-18 NOTE — ED Notes (Addendum)
Pt ambulated to restroom with no assistance, in NAD. C/o mild light headedness, but states she feels perfectly fine to ambulate.  improved after walking, but still present. States "I just don't feel myself."

## 2016-03-18 NOTE — H&P (Signed)
History and Physical  Patient Name: Kaitlyn Harper     [id number]    DOB: [birth year]    DOA: 03/18/2016 PCP: Nance Pear., NP   Patient coming from: Home  Chief Complaint: Syncope, SOB  HPI: Kaitlyn Harper is a 56 y.o. female with a past medical history significant for HTN, depression, diet-controlled DM who presents with SOB for 1 week and now syncope.  She was in her usual state of health until about the last week when she's not she is more short of breath with exertion than usual. Then tonight after work, she climbed 3 flights of stairs hurts apartment where she felt extremely short of breath, much more than usual. She then left and went to her son's house, where she climbed one flight of stairs, and at the top of the stairs felt sweaty, "not good", and passed out. She was with her son at the time, he caught her, and said that she was out for about 1 minute. She woke before EMS arrived, felt about like normal and was transported to [facility].  She has no previous history of syncope.  She denies chest pain this week, with the episode, or since then.  Denies palpitations.  Denies orthopnea, PND, leg swelling.  Denies hemopytsis, recent surgery, leg redness/pain.  No previous history of MI, stroke, PE, DVT.  ED course: -Afebrile, heart rate 77, respirations and pulse is normal, blood pressure 149/85 -Na 140, K 3.1, Cr 1.12 (baseline 1.1), WBC 6.9K, Hgb 12.8 -UA clear -Troponin 0.29 -ECG showed LAE, otherwise normal sinus rhythm, near left axis -She was given full aspirin 325 and K and TRH were asked to evaluate for elevated troponin             ROS: Review of Systems  Respiratory: Positive for shortness of breath. Negative for cough and hemoptysis.   Cardiovascular: Negative for chest pain, palpitations, orthopnea, leg swelling and PND.  Gastrointestinal: Negative for abdominal pain, nausea and vomiting.  Neurological: Positive for loss of consciousness. Negative for  dizziness, speech change, focal weakness, seizures and headaches.  All other systems reviewed and are negative.         Past Medical History:  Diagnosis Date  . Anemia   . Anxiety   . Depression   . Diabetes mellitus without complication ([place])    type 2-diet controlled  . Hyperglycemia   . Hyperlipidemia   . Hypertension     Past Surgical History:  Procedure Laterality Date  . APPENDECTOMY  1985  . CHOLECYSTECTOMY  1985  . COLONOSCOPY  02-28-2011   4 polyps   . DILATION AND CURETTAGE OF UTERUS  2005  . [place]  2002  . KNEE SURGERY  1983   arthroscopy?  [name] MOUTH SURGERY  03/08/2016  . POLYPECTOMY  2006   ?anal polyp?  [name] POLYPECTOMY  02-28-2011   4 polyps- 3 of them TA   . TEAR DUCT PROBING  2004  . TUBAL LIGATION  1985    Social History: The patient walks unassisted.  She works at [employer] on [address] [employer].  She is a former smoker.    Allergies  Allergen Reactions  . Sulfonamide Derivatives Hives    Light sensitivity    Family history: family history includes Alcohol abuse in her brother; Cirrhosis in her brother; Hypertension in her father; Sarcoidosis in her brother; Stroke in her maternal grandmother.  Prior to Admission medications   Medication Sig Start Date End Date Taking? Authorizing Provider  amLODipine-valsartan Vivi Ferns)  5-160 MG tablet TAKE 1 TABLET BY MOUTH DAILY. 11/24/15  Yes Debbrah Alar, NP  ARIPiprazole (ABILIFY) 5 MG tablet Take 5 mg by mouth daily.   Yes Historical Provider, MD  aspirin EC 81 MG tablet Take 81 mg by mouth daily.   Yes Historical Provider, MD  atorvastatin (LIPITOR) 20 MG tablet TAKE 1 TABLET BY MOUTH DAILY 09/21/15  Yes Debbrah Alar, NP  BYSTOLIC 20 MG TABS TAKE 1 TABLET (20 MG TOTAL) BY MOUTH DAILY. 03/09/16  Yes Debbrah Alar, NP  lamoTRIgine (LAMICTAL) 150 MG tablet Take 150 mg by mouth daily. 07/21/15  Yes Historical Provider, MD  loratadine (CLARITIN) 10 MG tablet Take 10 mg by mouth daily as needed.    Yes  Historical Provider, MD  nystatin cream (MYCOSTATIN) Apply 1 application topically 2 (two) times daily. Beneath breasts 01/24/15  Yes Debbrah Alar, NP  potassium chloride 20 MEQ/15ML (10%) SOLN Take 15 mLs (20 mEq total) by mouth daily. 08/11/15   Debbrah Alar, NP       Physical Exam: BP (!) 168/78 (BP Location: Left Arm)   Pulse 70   Temp 98.1 F (36.7 C) (Oral)   Resp 16   Ht 5\' 4"  (1.626 m)   Wt 79.6 kg (175 lb 8 oz)   LMP 12/25/2008   SpO2 100%   BMI 30.12 kg/m  General appearance: Well-developed, adult female, alert and in no acute distress.   Eyes: Anicteric, conjunctiva pink, lids and lashes normal. PERRL.    ENT: No nasal deformity, discharge, epistaxis.  Hearing normal. OP moist without lesions.   Neck: No neck masses.  Trachea midline.  No thyromegaly/tenderness. Lymph: No cervical or supraclavicular lymphadenopathy. Skin: Warm and dry.  No jaundice.  No suspicious rashes or lesions. Cardiac: RRR, nl S1-S2, no murmurs appreciated.  Capillary refill is brisk.  JVP normal.  No LE edema.  Radial and DP pulses 2+ and symmetric. Respiratory: Normal respiratory rate and rhythm.  CTAB without rales or wheezes. Abdomen: Abdomen soft.  No TTP. No ascites, distension, hepatosplenomegaly.   MSK: No deformities or effusions.  No cyanosis or clubbing. Neuro: Cranial nerves normal.  Sensation intact to light touch. Speech is fluent.  Muscle strength normal.    Psych: Sensorium intact and responding to questions, attention normal.  Behavior appropriate.  Affect normal.  Judgment and insight appear normal.     Labs on Admission:  I have personally reviewed following labs and imaging studies: CBC:  Recent Labs Lab 03/18/16 1742  WBC 6.9  HGB 12.8  HCT 38.4  MCV 91.9  PLT [id number]   Basic Metabolic Panel:  Recent Labs Lab 03/18/16 1742  NA 140  K 3.1*  CL 105  CO2 26  GLUCOSE 112*  BUN 10  CREATININE 1.12*  CALCIUM 9.2   GFR: Estimated Creatinine  Clearance: 56.6 mL/min (by C-G formula based on SCr of 1.12 mg/dL (H)).  Liver Function Tests: No results for input(s): AST, ALT, ALKPHOS, BILITOT, PROT, ALBUMIN in the last 168 hours. No results for input(s): LIPASE, AMYLASE in the last 168 hours. No results for input(s): AMMONIA in the last 168 hours. Coagulation Profile: No results for input(s): INR, PROTIME in the last 168 hours. Cardiac Enzymes:  Recent Labs Lab 03/18/16 1742  TROPONINI 0.29*   BNP (last 3 results) No results for input(s): PROBNP in the last 8760 hours. HbA1C: No results for input(s): HGBA1C in the last 72 hours. CBG: No results for input(s): GLUCAP in the last 168 hours. Lipid Profile:  No results for input(s): CHOL, HDL, LDLCALC, TRIG, CHOLHDL, LDLDIRECT in the last 72 hours. Thyroid Function Tests: No results for input(s): TSH, T4TOTAL, FREET4, T3FREE, THYROIDAB in the last 72 hours. Anemia Panel: No results for input(s): VITAMINB12, FOLATE, FERRITIN, TIBC, IRON, RETICCTPCT in the last 72 hours. Sepsis Labs: Invalid input(s): PROCALCITONIN, LACTICIDVEN No results found for this or any previous visit (from the past 240 hour(s)).       EKG: Independently reviewed. Rate 80, QTc 468, LAE, no ST changes.    Assessment/Plan Principal Problem:   Syncope Active Problems:   Anxiety and depression   Essential hypertension   Diabetes type 2, controlled (HCC)   Hypokalemia   Elevated troponin  1. Syncope with SOB and NSTEMI:  Unclear source of troponin leak, may be demand.  Will trend, r/o PE, obtain echo, and consult to Cardiology tomorrow, unless significant delta Trop tonight. -Trend troponin -Obtain d-dimer -Obtain CXR given SOB -Obtain Echo -Consult to Cardiology, appreciate care -Replete mag and K -Aspirin given -Continue baby aspirin, statin, BB   2. Hypokalemia:  Mild. -Check mag -Oral K ordered  3. Hypertension:  -Continue Bystolic and amlodipine, valsartan  4. Depression:    -Continue Lamictal and Abilify           DVT prophylaxis: Lovenox  Code Status: FULL Family Communication: None present  Disposition Plan: Anticipate trend troponin, above studies, consult to Cardiology  Consults called: None at time of this note Admission status: OBS At the point of initial evaluation, it is my clinical opinion that admission for OBSERVATION is reasonable and necessary because the patient's presenting complaints in the context of their chronic conditions represent sufficient risk of deterioration or significant morbidity to constitute reasonable grounds for close observation in the hospital setting, but that the patient may be medically stable for discharge from the hospital within 24 to 48 hours.    Medical decision making: Patient seen at 10:29 PM on 03/18/2016.  The patient was discussed with Dr. Lorin Mercy.  What exists of the patient's chart was reviewed in depth and summarized above.  Clinical condition: stable.        Edwin Dada [facility] Pager [phone]

## 2016-03-19 ENCOUNTER — Encounter (HOSPITAL_COMMUNITY): Payer: Self-pay | Admitting: Cardiology

## 2016-03-19 ENCOUNTER — Inpatient Hospital Stay (HOSPITAL_COMMUNITY): Payer: 59

## 2016-03-19 ENCOUNTER — Encounter (HOSPITAL_COMMUNITY)
Admission: EM | Disposition: A | Discharge status: Home / Self Care | Payer: Self-pay | Source: Home / Self Care | Attending: Internal Medicine

## 2016-03-19 DIAGNOSIS — R748 Abnormal levels of other serum enzymes: Secondary | ICD-10-CM

## 2016-03-19 DIAGNOSIS — R7989 Other specified abnormal findings of blood chemistry: Secondary | ICD-10-CM

## 2016-03-19 DIAGNOSIS — R55 Syncope and collapse: Secondary | ICD-10-CM

## 2016-03-19 DIAGNOSIS — I214 Non-ST elevation (NSTEMI) myocardial infarction: Secondary | ICD-10-CM | POA: Diagnosis present

## 2016-03-19 HISTORY — PX: LEFT HEART CATH AND CORONARY ANGIOGRAPHY: CATH118249

## 2016-03-19 LAB — ECHOCARDIOGRAM COMPLETE
HEIGHTINCHES: 64 in
Weight: 2808 oz

## 2016-03-19 LAB — CBC
HCT: 39.4 % (ref 36.0–46.0)
HEMOGLOBIN: 13.3 g/dL (ref 12.0–15.0)
MCH: 30.6 pg (ref 26.0–34.0)
MCHC: 33.8 g/dL (ref 30.0–36.0)
MCV: 90.8 fL (ref 78.0–100.0)
Platelets: 324 10*3/uL (ref 150–400)
RBC: 4.34 MIL/uL (ref 3.87–5.11)
RDW: 13.3 % (ref 11.5–15.5)
WBC: 9.4 10*3/uL (ref 4.0–10.5)

## 2016-03-19 LAB — BASIC METABOLIC PANEL
ANION GAP: 9 (ref 5–15)
BUN: 8 mg/dL (ref 6–20)
CALCIUM: 9.5 mg/dL (ref 8.9–10.3)
CO2: 24 mmol/L (ref 22–32)
Chloride: 107 mmol/L (ref 101–111)
Creatinine, Ser: 1.02 mg/dL — ABNORMAL HIGH (ref 0.44–1.00)
GFR calc Af Amer: >60 mL/min (ref >60)
GFR, EST NON AFRICAN AMERICAN: 60 mL/min — AB (ref >60)
Glucose, Bld: 114 mg/dL — ABNORMAL HIGH (ref 65–99)
POTASSIUM: 4.1 mmol/L (ref 3.5–5.1)
SODIUM: 140 mmol/L (ref 135–145)

## 2016-03-19 LAB — TROPONIN I
Troponin I: 0.87 ng/mL (ref <0.03)
Troponin I: 0.59 ng/mL (ref <0.03)

## 2016-03-19 LAB — PROTIME-INR
INR: 1.13
PROTHROMBIN TIME: 14.6 s (ref 11.4–15.2)

## 2016-03-19 LAB — HEPARIN LEVEL (UNFRACTIONATED): Heparin Unfractionated: 0.34 IU/mL (ref 0.30–0.70)

## 2016-03-19 LAB — GLUCOSE, CAPILLARY: Glucose-Capillary: 81 mg/dL (ref 65–99)

## 2016-03-19 SURGERY — LEFT HEART CATH AND CORONARY ANGIOGRAPHY
Anesthesia: LOCAL

## 2016-03-19 MED ORDER — MIDAZOLAM HCL 2 MG/2ML IJ SOLN
INTRAMUSCULAR | Status: AC
  Filled 2016-03-19: qty 2

## 2016-03-19 MED ORDER — SODIUM CHLORIDE 0.9% FLUSH: 3.0000 mL | INTRAVENOUS | Status: DC | PRN

## 2016-03-19 MED ORDER — SODIUM CHLORIDE 0.9 % WEIGHT BASED INFUSION: 1.0000 mL/kg/h | INTRAVENOUS | Status: DC

## 2016-03-19 MED ORDER — LAMOTRIGINE 25 MG PO TABS: 150.0000 mg | ORAL_TABLET | Freq: Every evening | ORAL | Status: DC

## 2016-03-19 MED ORDER — SODIUM CHLORIDE 0.9% FLUSH: 3.0000 mL | Freq: Two times a day (BID) | INTRAVENOUS | Status: DC

## 2016-03-19 MED ORDER — FENTANYL CITRATE (PF) 100 MCG/2ML IJ SOLN
INTRAMUSCULAR | Status: AC
  Filled 2016-03-19: qty 2

## 2016-03-19 MED ORDER — SODIUM CHLORIDE 0.9 % IV SOLN: 250.0000 mL | INTRAVENOUS | Status: DC | PRN

## 2016-03-19 MED ORDER — SODIUM CHLORIDE 0.9 % WEIGHT BASED INFUSION: 1.0000 mL/kg/h | INTRAVENOUS | Status: AC

## 2016-03-19 MED ORDER — HEPARIN SODIUM (PORCINE) 1000 UNIT/ML IJ SOLN
INTRAMUSCULAR | Status: AC
  Filled 2016-03-19: qty 1

## 2016-03-19 MED ORDER — IOPAMIDOL (ISOVUE-370) INJECTION 76%
INTRAVENOUS | Status: AC
  Filled 2016-03-19: qty 100

## 2016-03-19 MED ORDER — LAMOTRIGINE 25 MG PO TABS
100.0000 mg | ORAL_TABLET | Freq: Every morning | ORAL | Status: DC
  Administered 2016-03-19: 100 mg via ORAL
  Filled 2016-03-19: qty 4

## 2016-03-19 MED ORDER — HEPARIN (PORCINE) IN NACL 2-0.9 UNIT/ML-% IJ SOLN
INTRAMUSCULAR | Status: AC
  Filled 2016-03-19: qty 1000

## 2016-03-19 MED ORDER — ASPIRIN 81 MG PO CHEW: 81.0000 mg | CHEWABLE_TABLET | ORAL | Status: DC

## 2016-03-19 MED ORDER — FENTANYL CITRATE (PF) 100 MCG/2ML IJ SOLN
INTRAMUSCULAR | Status: DC | PRN
  Administered 2016-03-19: 25 ug via INTRAVENOUS

## 2016-03-19 MED ORDER — SODIUM CHLORIDE 0.9 % WEIGHT BASED INFUSION: 3.0000 mL/kg/h | INTRAVENOUS | Status: DC

## 2016-03-19 MED ORDER — HEPARIN BOLUS VIA INFUSION
4000.0000 [IU] | Freq: Once | INTRAVENOUS | Status: AC
  Administered 2016-03-19: 4000 [IU] via INTRAVENOUS
  Filled 2016-03-19: qty 4000

## 2016-03-19 MED ORDER — HEPARIN (PORCINE) IN NACL 100-0.45 UNIT/ML-% IJ SOLN
1000.0000 [IU]/h | INTRAMUSCULAR | Status: DC
  Administered 2016-03-19: 1000 [IU]/h via INTRAVENOUS
  Filled 2016-03-19: qty 250

## 2016-03-19 MED ORDER — MIDAZOLAM HCL 2 MG/2ML IJ SOLN
INTRAMUSCULAR | Status: DC | PRN
  Administered 2016-03-19: 1 mg via INTRAVENOUS

## 2016-03-19 MED ORDER — LIDOCAINE HCL (PF) 1 % IJ SOLN
INTRAMUSCULAR | Status: AC
  Filled 2016-03-19: qty 30

## 2016-03-19 MED ORDER — ASPIRIN EC 325 MG PO TBEC
325.0000 mg | DELAYED_RELEASE_TABLET | Freq: Every day | ORAL | Status: DC
  Administered 2016-03-19 – 2016-03-20 (×2): 325 mg via ORAL
  Filled 2016-03-19 (×2): qty 1

## 2016-03-19 MED ORDER — LIDOCAINE HCL (PF) 1 % IJ SOLN
INTRAMUSCULAR | Status: DC | PRN
  Administered 2016-03-19: 2 mL
  Administered 2016-03-19: 10 mL

## 2016-03-19 MED ORDER — VERAPAMIL HCL 2.5 MG/ML IV SOLN
INTRAVENOUS | Status: AC
  Filled 2016-03-19: qty 2

## 2016-03-19 SURGICAL SUPPLY — 12 items
CATH INFINITI 5FR MULTPACK ANG (CATHETERS) ×1 IMPLANT
COVER PRB 48X5XTLSCP FOLD TPE (BAG) IMPLANT
COVER PROBE 5X48 (BAG) ×2
GLIDESHEATH SLEND SS 6F .021 (SHEATH) ×1 IMPLANT
GUIDEWIRE INQWIRE 1.5J.035X260 (WIRE) IMPLANT
INQWIRE 1.5J .035X260CM (WIRE) ×2
KIT HEART LEFT (KITS) ×2 IMPLANT
PACK CARDIAC CATHETERIZATION (CUSTOM PROCEDURE TRAY) ×2 IMPLANT
SHEATH PINNACLE 5F 10CM (SHEATH) ×1 IMPLANT
SYR MEDRAD MARK V 150ML (SYRINGE) ×2 IMPLANT
TRANSDUCER W/STOPCOCK (MISCELLANEOUS) ×2 IMPLANT
TUBING CIL FLEX 10 FLL-RA (TUBING) ×2 IMPLANT

## 2016-03-19 NOTE — Consult Note (Signed)
Cardiology Consult Note  Admit date: 03/18/2016 Name: Kaitlyn Harper 56 y.o.  female DOB:  [birth year] MRN:  [id number]  Today's date:  03/19/2016  Referring Physician:    [facility]  Primary Provider:   FNP Inda Castle  Reason for Consultation:    Syncope abnormal troponin  IMPRESSIONS: 1.  Syncope occurring with exertion associated with an abnormal troponin with significant elevation in the absence of chest pain 2.  Exertional dyspnea 3.  Hypertension 4.  Prior history of diabetes currently diet controlled 5.  Hyperlipidemia 6.  Hypokalemia  RECOMMENDATION: Concerning is the elevation of troponin occurring in the setting of exertional dyspnea at a level that she has not used to having dyspnea at.  EKG shows first-degree AV block.  This may have been an ischemic event or a possible non-STEMI.  He was advised evaluation of EKG in the morning as well as cycling troponins again.  Initiate heparin and given aspirin.  Keep nothing by mouth for possible cardiac catheterization today.  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  She has a history of hypertension, hyperlipidemia and was diabetic but is now diet-controlled and has lost weight.  There is no family history of cardiac disease.  She does not get much in the way bradycardia and a girl exercise and smoked until about a year ago at which point she quit.  She experienced some dyspnea walking 3 flights of stairs into her apartment.  This is unusual for her but she has noticed that intermittently on occasions before.  Following that she went to her son's house to attend a gender revealed party for her son.  She walked up one flight of stairs and became severely dyspneic on reaching the top of the stairs.  The son opened the door and as she was greeting them she felt hot and felt as if she would be faint and then was assisted to the floor by one of her son's friends.  She was out for  about a minute and was taken to med Center.  Initial troponin was abnormal EKG showed first-degree AV block and she was transferred here.  She did not have any chest pressure tightness or heaviness.  She denies PND, orthopnea or edema normally.  She does have hypokalemia and has received some medicine for that previously.  Past Medical History:  Diagnosis Date  . Anemia   . Anxiety   . Depression   . Diabetes mellitus without complication ([employer])    type 2-diet controlled  . Hyperglycemia   . Hyperlipidemia   . Hypertension      Past Surgical History:  Procedure Laterality Date  . APPENDECTOMY  1985  . CHOLECYSTECTOMY  1985  . COLONOSCOPY  02-28-2011   4 polyps   . DILATION AND CURETTAGE OF UTERUS  2005  . [place]  2002  . KNEE SURGERY  1983   arthroscopy?  [name] MOUTH SURGERY  03/08/2016  . POLYPECTOMY  2006   ?anal polyp?  [name] POLYPECTOMY  02-28-2011   4 polyps- 3 of them TA   . TEAR DUCT PROBING  2004  . TUBAL LIGATION  1985    Allergies:  is allergic to sulfonamide derivatives.   Medications: Prior to Admission medications   Medication Sig Start Date End Date Taking? Authorizing Provider  amLODipine-valsartan (EXFORGE) 5-160 MG tablet TAKE 1 TABLET BY MOUTH DAILY. 11/24/15  Yes Debbrah Alar, NP  ARIPiprazole (ABILIFY) 5 MG tablet Take 5  mg by mouth daily.   Yes Historical Provider, MD  aspirin EC 81 MG tablet Take 81 mg by mouth daily.   Yes Historical Provider, MD  atorvastatin (LIPITOR) 20 MG tablet TAKE 1 TABLET BY MOUTH DAILY 09/21/15  Yes Debbrah Alar, NP  BYSTOLIC 20 MG TABS TAKE 1 TABLET (20 MG TOTAL) BY MOUTH DAILY. 03/09/16  Yes Debbrah Alar, NP  lamoTRIgine (LAMICTAL) 150 MG tablet Take 150 mg by mouth daily. 07/21/15  Yes Historical Provider, MD  loratadine (CLARITIN) 10 MG tablet Take 10 mg by mouth daily as needed.    Yes Historical Provider, MD  nystatin cream (MYCOSTATIN) Apply 1 application topically 2 (two) times daily. Beneath breasts  01/24/15  Yes Debbrah Alar, NP  potassium chloride 20 MEQ/15ML (10%) SOLN Take 15 mLs (20 mEq total) by mouth daily. 08/11/15   Debbrah Alar, NP   Family History: Family Status  Relation Status  . Father Alive  . Brother Alive  . Brother Deceased  . Mother Deceased   gunshot wound  . Maternal Grandmother   . Neg Hx     Social History:   reports that she quit smoking about 8 months ago. Her smoking use included Cigarettes. She smoked 0.25 packs per day. She has never used smokeless tobacco. She reports that she drinks alcohol. She reports that she does not use drugs.   Social History   Social History Narrative   Caffeine use:  2 drinks   Regular exercise: no         Review of Systems: She has been obese for several years but has lost some weight.  Does have a history of previous depression.  No GI issues and no urinary problems.  Occasional headaches.  No eye ear nose or throat issues.  Other than as noted above remainder of the review of systems is unremarkable  Physical Exam: BP (!) 168/78 (BP Location: Left Arm)   Pulse 70   Temp 98.1 F (36.7 C) (Oral)   Resp 16   Ht 5\' 4"  (1.626 m)   Wt 79.6 kg (175 lb 8 oz)   LMP 12/25/2008   SpO2 100%   BMI 30.12 kg/m   General appearance: She is applied pleasant slightly obese black female in no acute distress Head: Normocephalic, without obvious abnormality, atraumatic Eyes: conjunctivae/corneas clear. PERRL, EOM's intact. Fundi not examined  Neck: no adenopathy, no carotid bruit, no JVD and supple, symmetrical, trachea midline Lungs: clear to auscultation bilaterally Heart: regular rate and rhythm, S1, S2 normal, no murmur, click, rub or gallop Abdomen: soft, non-tender; bowel sounds normal; no masses,  no organomegaly Pelvic: deferred Extremities: extremities normal, atraumatic, no cyanosis or edema Pulses: 2+ and symmetric Skin: Skin color, texture, turgor normal. No rashes or lesions Neurologic: Grossly  normal Psych: Alert and oriented x 3 Labs: CBC  Recent Labs  03/18/16 1742  WBC 6.9  RBC 4.18  HGB 12.8  HCT 38.4  PLT 334  MCV 91.9  MCH 30.6  MCHC 33.3  RDW 13.3   CMP   Recent Labs  03/18/16 1742  NA 140  K 3.1*  CL 105  CO2 26  GLUCOSE 112*  BUN 10  CREATININE 1.12*  CALCIUM 9.2  GFRNONAA 53*  GFRAA >60    Cardiac Panel (last 3 results)  Recent Labs  03/18/16 1742 03/18/16 2222  TROPONINI 0.29* 1.25*     Radiology:  Lungs with no acute changes, borderline cardiomegaly.  EKG: Sinus rhythm initially but with baseline artifact,  subsequent EKG shows first-degree AV block, there is T-wave inversion in aVL only.  Independently reviewed by me  Signed:  W. Doristine Church MD [facility]   Cardiology Consultant  03/19/2016, 1:01 AM

## 2016-03-19 NOTE — Consult Note (Signed)
   [facility] CM Inpatient Consult   03/19/2016  ASHLAY BENTON 12/27/1960 AN:[id number]    Came by to see Ms. Guerry on behalf of Link to [facility] Care Management program for [facility] employees/dependents with [facility] insurance. Spoke with Ms. Holst about the [employer]/Link to Wellness program for [facility] for DM management. Provided brochure with [place] link for sign up and contact information. Also made Ms. Kaufman aware that she will receive post hospital discharge call post discharge. She is agreeable to this. Confirmed best contact number as [phone].   Will request to be assigned for post discharge follow up call. Will make inpatient RNCM aware of bedside visit as well.   Marthenia Rolling, MSN-Ed, RN,BSN [facility] Liaison [phone]

## 2016-03-19 NOTE — Progress Notes (Signed)
[place] for Heparin Indication: chest pain/ACS  Allergies  Allergen Reactions  . Sulfonamide Derivatives Hives    Light sensitivity    Patient Measurements: Height: 5\' 4"  (162.6 cm) Weight: 175 lb 8 oz (79.6 kg) IBW/kg (Calculated) : 54.7  Vital Signs: Temp: 98.1 F (36.7 C) (02/04 2146) Temp Source: Oral (02/04 2146) BP: 168/78 (02/04 2146) Pulse Rate: 70 (02/04 2146)  Labs:  Recent Labs  03/18/16 1742 03/18/16 2222  HGB 12.8  --   HCT 38.4  --   PLT 334  --   CREATININE 1.12*  --   TROPONINI 0.29* 1.25*    Estimated Creatinine Clearance: 56.6 mL/min (by C-G formula based on SCr of 1.12 mg/dL (H)).   Medical History: Past Medical History:  Diagnosis Date  . Anemia   . Anxiety   . Depression   . Diabetes mellitus without complication ([place])    type 2-diet controlled  . Hyperglycemia   . Hyperlipidemia   . Hypertension     Medications:  Prescriptions Prior to Admission  Medication Sig Dispense Refill Last Dose  . amLODipine-valsartan (EXFORGE) 5-160 MG tablet TAKE 1 TABLET BY MOUTH DAILY. 90 tablet 1   . ARIPiprazole (ABILIFY) 5 MG tablet Take 5 mg by mouth daily.   Taking  . aspirin EC 81 MG tablet Take 81 mg by mouth daily.   Taking  . atorvastatin (LIPITOR) 20 MG tablet TAKE 1 TABLET BY MOUTH DAILY 90 tablet 1   . BYSTOLIC 20 MG TABS TAKE 1 TABLET (20 MG TOTAL) BY MOUTH DAILY. 30 tablet 0   . lamoTRIgine (LAMICTAL) 150 MG tablet Take 150 mg by mouth daily.  0   . loratadine (CLARITIN) 10 MG tablet Take 10 mg by mouth daily as needed.    Taking  . nystatin cream (MYCOSTATIN) Apply 1 application topically 2 (two) times daily. Beneath breasts 30 g 1 Taking  . potassium chloride 20 MEQ/15ML (10%) SOLN Take 15 mLs (20 mEq total) by mouth daily. 900 mL 2   . [DISCONTINUED] nicotine (NICODERM CQ - DOSED IN MG/24 HOURS) 14 mg/24hr patch Place 1 patch (14 mg total) onto the skin daily. 14 patch 0   . [DISCONTINUED] nicotine  (NICODERM CQ) 7 mg/24hr patch Place 1 patch (7 mg total) onto the skin daily. 14 patch 0     Assessment: 56 y.o. female with syncope/SOB, elevated cardiac markers, for heparin  Goal of Therapy:  Heparin level 0.3-0.7 units/ml Monitor platelets by anticoagulation protocol: Yes   Plan:  Heparin 4000 units IV bolus, then start heparin 1000 units/hr Check heparin level in 6 hours.  Caryl Pina 03/19/2016,12:14 AM

## 2016-03-19 NOTE — Progress Notes (Signed)
PROGRESS NOTE    Kaitlyn Harper  [id number] DOB: 1961-01-10 DOA: 03/18/2016 PCP: Nance Pear., NP  Brief Narrative: Kaitlyn Harper is a 56 y.o. female with a past medical history significant for HTN, depression, diet-controlled DM who presented with SOB for 1 week and now syncope. 2/4 night after work, she climbed 3 flights of stairs, felt extremely short of breath, much more than usual. She then left and went to her son's house, where she climbed one flight of stairs, and at the top of the stairs felt sweaty, "not good", and passed out. In ED, EKG unchanged, Troponin elevated, Cards consulting  Assessment & Plan:  1. Syncope /NSTEMI:  -tele with first degree AVB -ECHo with normal EF and wall motion, d dimer very mildly elevated but clinically do not suspect PE at this time, no hypoxia, tachycardia, ECHO with Normal EF/wall motion, normal RV and normal PA pressures -due to H/o DM and elevated troponin up to 1.2 overnight, Cards following, plan for LHC today -continue Aspirin, statin, BB  2. Hypokalemia:  -replaced, mag 1.9  3. Hypertension:  -Continue Bystolic and amlodipine, valsartan  4. Depression:  -Continue Lamictal and Abilify  DVT prophylaxis: IV heparin now Code Status: FULL Family Communication: daughter at bedside Disposition Plan: pending cards workup   Consultants:   Cards   Subjective: Feels well, no chest pain, dyspnea  Objective: Vitals:   03/18/16 2030 03/18/16 2051 03/18/16 2146 03/19/16 0402  BP: 129/76 156/72 (!) 168/78 131/74  Pulse: 64 67 70 (!) 58  Resp: 20 16 16 16   Temp:   98.1 F (36.7 C) 98.1 F (36.7 C)  TempSrc:   Oral Oral  SpO2: 97% 99% 100% 100%  Weight:   79.6 kg (175 lb 8 oz)   Height:   5\' 4"  (1.626 m)     Intake/Output Summary (Last 24 hours) at 03/19/16 1220 Last data filed at 03/18/16 2350  Gross per 24 hour  Intake              250 ml  Output                0 ml  Net              250 ml   Filed  Weights   03/18/16 1656 03/18/16 2146  Weight: 78.5 kg (173 lb) 79.6 kg (175 lb 8 oz)    Examination:  General exam: Appears calm and comfortable, AAOx3, no distress Respiratory system: Clear to auscultation. Respiratory effort normal. Cardiovascular system: S1 & S2 heard, RRR. No JVD, murmurs, Gastrointestinal system: Abdomen is nondistended, soft and nontender. Normal bowel sounds heard. Central nervous system: Alert and oriented. No focal neurological deficits. Extremities: Symmetric 5 x 5 power. Skin: No rashes, lesions or ulcers Psychiatry: Judgement and insight appear normal. Mood & affect appropriate.     Data Reviewed: I have personally reviewed following labs and imaging studies  CBC:  Recent Labs Lab 03/18/16 1742 03/19/16 0407  WBC 6.9 9.4  HGB 12.8 13.3  HCT 38.4 39.4  MCV 91.9 90.8  PLT 334 0000000   Basic Metabolic Panel:  Recent Labs Lab 03/18/16 1742 03/18/16 2222 03/19/16 0407  NA 140  --  140  K 3.1*  --  4.1  CL 105  --  107  CO2 26  --  24  GLUCOSE 112*  --  114*  BUN 10  --  8  CREATININE 1.12*  --  1.02*  CALCIUM 9.2  --  9.5  MG  --  1.9  --    GFR: Estimated Creatinine Clearance: 62.2 mL/min (by C-G formula based on SCr of 1.02 mg/dL (H)). Liver Function Tests: No results for input(s): AST, ALT, ALKPHOS, BILITOT, PROT, ALBUMIN in the last 168 hours. No results for input(s): LIPASE, AMYLASE in the last 168 hours. No results for input(s): AMMONIA in the last 168 hours. Coagulation Profile: No results for input(s): INR, PROTIME in the last 168 hours. Cardiac Enzymes:  Recent Labs Lab 03/18/16 1742 03/18/16 2222 03/19/16 0407  TROPONINI 0.29* 1.25* 0.87*   BNP (last 3 results) No results for input(s): PROBNP in the last 8760 hours. HbA1C: No results for input(s): HGBA1C in the last 72 hours. CBG: No results for input(s): GLUCAP in the last 168 hours. Lipid Profile: No results for input(s): CHOL, HDL, LDLCALC, TRIG, CHOLHDL,  LDLDIRECT in the last 72 hours. Thyroid Function Tests: No results for input(s): TSH, T4TOTAL, FREET4, T3FREE, THYROIDAB in the last 72 hours. Anemia Panel: No results for input(s): VITAMINB12, FOLATE, FERRITIN, TIBC, IRON, RETICCTPCT in the last 72 hours. Urine analysis:    Component Value Date/Time   COLORURINE YELLOW 03/18/2016 1710   APPEARANCEUR CLEAR 03/18/2016 1710   LABSPEC 1.008 03/18/2016 1710   PHURINE 7.5 03/18/2016 1710   GLUCOSEU NEGATIVE 03/18/2016 1710   GLUCOSEU NEGATIVE 01/24/2015 1647   HGBUR NEGATIVE 03/18/2016 1710   BILIRUBINUR NEGATIVE 03/18/2016 1710   BILIRUBINUR ++ 02/18/2015 1450   KETONESUR NEGATIVE 03/18/2016 1710   PROTEINUR 30 (A) 03/18/2016 1710   UROBILINOGEN negative 02/18/2015 1450   UROBILINOGEN 0.2 01/24/2015 1647   NITRITE NEGATIVE 03/18/2016 1710   LEUKOCYTESUR NEGATIVE 03/18/2016 1710   Sepsis Labs: @LABRCNTIP (procalcitonin:4,lacticidven:4)  )No results found for this or any previous visit (from the past 240 hour(s)).       Radiology Studies: Dg Chest 2 View  Result Date: 03/18/2016 CLINICAL DATA:  Shortness of breath EXAM: CHEST  2 VIEW COMPARISON:  07/31/2008 FINDINGS: No acute pulmonary infiltrate or effusion. Heart size borderline enlarged. No pneumothorax. Surgical clips in the right upper quadrant. IMPRESSION: 1. No acute infiltrate or edema 2. Borderline cardiomegaly Electronically Signed   By: Donavan Foil M.D.   On: 03/18/2016 23:52        Scheduled Meds: . amLODipine  5 mg Oral Daily  . ARIPiprazole  5 mg Oral Daily  . aspirin EC  325 mg Oral Daily  . atorvastatin  20 mg Oral Daily  . irbesartan  150 mg Oral Daily  . lamoTRIgine  100 mg Oral q morning - 10a  . lamoTRIgine  150 mg Oral QPM  . nebivolol  20 mg Oral Daily  . potassium chloride  20 mEq Oral Daily  . sodium chloride flush  3 mL Intravenous Q12H   Continuous Infusions: . heparin 1,000 Units/hr (03/19/16 0115)     LOS: 1 day    Time spent:  50min    Domenic Polite, MD [facility] Pager [phone]  If 7PM-7AM, please contact night-coverage [email or web address] Password [email or web address] 03/19/2016, 12:20 PM

## 2016-03-19 NOTE — Progress Notes (Signed)
Site area: rt groin fa sheath Site Prior to Removal:  Level 0 Pressure Applied For: 20 minutes Manual:   yes Patient Status During Pull:  stable Post Pull Site:  Level  0 Post Pull Instructions Given:  yes Post Pull Pulses Present: palpable Dressing Applied:  yes Bedrest begins @ [id number] Comments:

## 2016-03-19 NOTE — H&P (View-Only) (Signed)
Progress Note  Patient Name: [name] Date of Encounter: 03/19/2016  Primary Cardiologist: Dr. Percival Spanish  Subjective   No chest pain, or dyspnea.   Inpatient Medications    Scheduled Meds: . amLODipine  5 mg Oral Daily  . ARIPiprazole  5 mg Oral Daily  . aspirin EC  81 mg Oral Daily  . atorvastatin  20 mg Oral Daily  . irbesartan  150 mg Oral Daily  . lamoTRIgine  150 mg Oral Daily  . nebivolol  20 mg Oral Daily  . potassium chloride  20 mEq Oral Daily  . sodium chloride flush  3 mL Intravenous Q12H   Continuous Infusions: . heparin 1,000 Units/hr (03/19/16 0115)   PRN Meds: acetaminophen **OR** acetaminophen   Vital Signs    Vitals:   03/18/16 2030 03/18/16 2051 03/18/16 2146 03/19/16 0402  BP: 129/76 156/72 (!) 168/78 131/74  Pulse: 64 67 70 (!) 58  Resp: 20 16 16 16   Temp:   98.1 F (36.7 C) 98.1 F (36.7 C)  TempSrc:   Oral Oral  SpO2: 97% 99% 100% 100%  Weight:   175 lb 8 oz (79.6 kg)   Height:   5\' 4"  (1.626 m)     Intake/Output Summary (Last 24 hours) at 03/19/16 0800 Last data filed at 03/18/16 2350  Gross per 24 hour  Intake              250 ml  Output                0 ml  Net              250 ml   Filed Weights   03/18/16 1656 03/18/16 2146  Weight: 173 lb (78.5 kg) 175 lb 8 oz (79.6 kg)    Telemetry    SB - Personally Reviewed  ECG    SR with TWI aVL and v1- Personally Reviewed  Physical Exam   GEN: No acute distress.   Neck: No JVD Cardiac: RRR, no murmurs, rubs, or gallops.  Respiratory: Clear to auscultation bilaterally. GI: Soft, nontender, non-distended  MS: No edema; No deformity. Neuro:  Nonfocal  Psych: Normal affect   Labs    Chemistry Recent Labs Lab 03/18/16 1742 03/19/16 0407  NA 140 140  K 3.1* 4.1  CL 105 107  CO2 26 24  GLUCOSE 112* 114*  BUN 10 8  CREATININE 1.12* 1.02*  CALCIUM 9.2 9.5  GFRNONAA 53* 60*  GFRAA >60 >60  ANIONGAP 9 9     Hematology Recent Labs Lab 03/18/16 1742  03/19/16 0407  WBC 6.9 9.4  RBC 4.18 4.34  HGB 12.8 13.3  HCT 38.4 39.4  MCV 91.9 90.8  MCH 30.6 30.6  MCHC 33.3 33.8  RDW 13.3 13.3  PLT 334 324    Cardiac Enzymes Recent Labs Lab 03/18/16 1742 03/18/16 2222 03/19/16 0407  TROPONINI 0.29* 1.25* 0.87*   No results for input(s): TROPIPOC in the last 168 hours.   BNPNo results for input(s): BNP, PROBNP in the last 168 hours.   DDimer  Recent Labs Lab 03/18/16 2204  DDIMER 0.64*     Radiology    Dg Chest 2 View  Result Date: 03/18/2016 CLINICAL DATA:  Shortness of breath EXAM: CHEST  2 VIEW COMPARISON:  07/31/2008 FINDINGS: No acute pulmonary infiltrate or effusion. Heart size borderline enlarged. No pneumothorax. Surgical clips in the right upper quadrant. IMPRESSION: 1. No acute infiltrate or edema 2. Borderline cardiomegaly Electronically Signed  By: Donavan Foil M.D.   On: 03/18/2016 23:52    Cardiac Studies   TTE: Pending  Patient Profile     56 y.o. female with PMH of HTN, HLD and diet controlled DM who presented with exertional dyspnea with climbing stairs, and noted to have an elevated troponin.   Assessment & Plan    1. Exertional dyspnea: Reports she climbs stairs everyday to her apt, but has noticed in the past couple of weeks she is becoming more dyspneic with this activity. Yesterday climbed stairs, felt hot, had a brief episode of syncope and brought to [facility]. EKG nonacute with 1st degree AVB, trop 0.29>>1.25>>0.87.  -- currently on IV heparin, planned for cardiac catheterization today  2. HTN: Hypertensive on arrival, but now improved. On ARB, and amlodipine, BB  3. HLD: On statin  Signed, Reino Bellis, NP  03/19/2016, 8:00 AM    Patient seen and examined. Agree with assessment and plan. ECG reveals sinus bradycardia with first degree block without STT changes.  With change in exertional symptoms, cardiac risk factors and mildly positive troponins following syncope agree with plans for  definitive cath. I have reviewed the risks, indications, and alternatives to cardiac catheterization, possible angioplasty, and stenting with the patient. Risks include but are not limited to bleeding, infection, vascular injury, stroke, myocardial infection, arrhythmia, kidney injury, radiation-related injury in the case of prolonged fluoroscopy use, emergency cardiac surgery, and death. The patient understands the risks of serious complication is 1-2 in [id number] with diagnostic cardiac cath and 1-2% or less with angioplasty/stenting. Plan later today.   Troy Sine, MD, [facility] 03/19/2016 8:16 AM

## 2016-03-19 NOTE — Progress Notes (Signed)
[place] for Heparin Indication: chest pain/ACS  Allergies  Allergen Reactions  . Sulfonamide Derivatives Hives    Light sensitivity    Patient Measurements: Height: 5\' 4"  (162.6 cm) Weight: 175 lb 8 oz (79.6 kg) IBW/kg (Calculated) : 54.7  Vital Signs: Temp: 98.1 F (36.7 C) (02/05 0402) Temp Source: Oral (02/05 0402) BP: 131/74 (02/05 0402) Pulse Rate: 58 (02/05 0402)  Labs:  Recent Labs  03/18/16 1742 03/18/16 2222 03/19/16 0407 03/19/16 0810  HGB 12.8  --  13.3  --   HCT 38.4  --  39.4  --   PLT 334  --  324  --   HEPARINUNFRC  --   --   --  0.34  CREATININE 1.12*  --  1.02*  --   TROPONINI 0.29* 1.25* 0.87*  --     Estimated Creatinine Clearance: 62.2 mL/min (by C-G formula based on SCr of 1.02 mg/dL (H)).   Medical History: Past Medical History:  Diagnosis Date  . Anemia   . Anxiety   . Depression   . Diabetes mellitus without complication ([place])    type 2-diet controlled  . Hyperglycemia   . Hyperlipidemia   . Hypertension     Medications:  Prescriptions Prior to Admission  Medication Sig Dispense Refill Last Dose  . amLODipine-valsartan (EXFORGE) 5-160 MG tablet TAKE 1 TABLET BY MOUTH DAILY. 90 tablet 1   . ARIPiprazole (ABILIFY) 5 MG tablet Take 5 mg by mouth daily.   Taking  . aspirin EC 81 MG tablet Take 81 mg by mouth daily.   Taking  . atorvastatin (LIPITOR) 20 MG tablet TAKE 1 TABLET BY MOUTH DAILY 90 tablet 1   . BYSTOLIC 20 MG TABS TAKE 1 TABLET (20 MG TOTAL) BY MOUTH DAILY. 30 tablet 0   . lamoTRIgine (LAMICTAL) 150 MG tablet Take 150 mg by mouth daily.  0   . loratadine (CLARITIN) 10 MG tablet Take 10 mg by mouth daily as needed.    Taking  . nystatin cream (MYCOSTATIN) Apply 1 application topically 2 (two) times daily. Beneath breasts 30 g 1 Taking  . potassium chloride 20 MEQ/15ML (10%) SOLN Take 15 mLs (20 mEq total) by mouth daily. 900 mL 2   . [DISCONTINUED] nicotine (NICODERM CQ - DOSED IN  MG/24 HOURS) 14 mg/24hr patch Place 1 patch (14 mg total) onto the skin daily. 14 patch 0   . [DISCONTINUED] nicotine (NICODERM CQ) 7 mg/24hr patch Place 1 patch (7 mg total) onto the skin daily. 14 patch 0     Assessment: 56 y.o. female with syncope/SOB, elevated cardiac markers, continuing on heparin. Not on anticoagulation PTA. D-dimer 0.64. CBC wnl, no bleed documented. Cath planned for 2/5.   Goal of Therapy:  Heparin level 0.3-0.7 units/ml Monitor platelets by anticoagulation protocol: Yes   Plan:  Heparin at 1000 units/h 6h heparin level vs. cath Daily heparin level/CBC Monitor for s/sx bleeding Cath for 2/5   Elicia Lamp, PharmD, BCPS Clinical Pharmacist 03/19/2016 10:14 AM

## 2016-03-19 NOTE — Progress Notes (Signed)
  Echocardiogram 2D Echocardiogram has been performed.  Diamond Nickel 03/19/2016, 9:34 AM

## 2016-03-19 NOTE — Interval H&P Note (Signed)
History and Physical Interval Note:  03/19/2016 4:13 PM  Oaklie A Unterreiner  has presented today for surgery, with the diagnosis of syncope  The various methods of treatment have been discussed with the patient and family. After consideration of risks, benefits and other options for treatment, the patient has consented to  Procedure(s): Left Heart Cath and Coronary Angiography (N/A) as a surgical intervention .  The patient's history has been reviewed, patient examined, no change in status, stable for surgery.  I have reviewed the patient's chart and labs.  Questions were answered to the patient's satisfaction.   Cath Lab Visit (complete for each Cath Lab visit)  Clinical Evaluation Leading to the Procedure:   ACS: Yes.    Non-ACS:    Anginal Classification: CCS II  Anti-ischemic medical therapy: Maximal Therapy (2 or more classes of medications)  Non-Invasive Test Results: No non-invasive testing performed  Prior CABG: No previous CABG        Collier [facility] 03/19/2016 4:13 PM

## 2016-03-19 NOTE — Progress Notes (Signed)
Progress Note  Patient Name: Kaitlyn Harper Date of Encounter: 03/19/2016  Primary Cardiologist: Dr. Percival Spanish  Subjective   No chest pain, or dyspnea.   Inpatient Medications    Scheduled Meds: . amLODipine  5 mg Oral Daily  . ARIPiprazole  5 mg Oral Daily  . aspirin EC  81 mg Oral Daily  . atorvastatin  20 mg Oral Daily  . irbesartan  150 mg Oral Daily  . lamoTRIgine  150 mg Oral Daily  . nebivolol  20 mg Oral Daily  . potassium chloride  20 mEq Oral Daily  . sodium chloride flush  3 mL Intravenous Q12H   Continuous Infusions: . heparin 1,000 Units/hr (03/19/16 0115)   PRN Meds: acetaminophen **OR** acetaminophen   Vital Signs    Vitals:   03/18/16 2030 03/18/16 2051 03/18/16 2146 03/19/16 0402  BP: 129/76 156/72 (!) 168/78 131/74  Pulse: 64 67 70 (!) 58  Resp: 20 16 16 16   Temp:   98.1 F (36.7 C) 98.1 F (36.7 C)  TempSrc:   Oral Oral  SpO2: 97% 99% 100% 100%  Weight:   175 lb 8 oz (79.6 kg)   Height:   5\' 4"  (1.626 m)     Intake/Output Summary (Last 24 hours) at 03/19/16 0800 Last data filed at 03/18/16 2350  Gross per 24 hour  Intake              250 ml  Output                0 ml  Net              250 ml   Filed Weights   03/18/16 1656 03/18/16 2146  Weight: 173 lb (78.5 kg) 175 lb 8 oz (79.6 kg)    Telemetry    SB - Personally Reviewed  ECG    SR with TWI aVL and v1- Personally Reviewed  Physical Exam   GEN: No acute distress.   Neck: No JVD Cardiac: RRR, no murmurs, rubs, or gallops.  Respiratory: Clear to auscultation bilaterally. GI: Soft, nontender, non-distended  MS: No edema; No deformity. Neuro:  Nonfocal  Psych: Normal affect   Labs    Chemistry Recent Labs Lab 03/18/16 1742 03/19/16 0407  NA 140 140  K 3.1* 4.1  CL 105 107  CO2 26 24  GLUCOSE 112* 114*  BUN 10 8  CREATININE 1.12* 1.02*  CALCIUM 9.2 9.5  GFRNONAA 53* 60*  GFRAA >60 >60  ANIONGAP 9 9     Hematology Recent Labs Lab 03/18/16 1742  03/19/16 0407  WBC 6.9 9.4  RBC 4.18 4.34  HGB 12.8 13.3  HCT 38.4 39.4  MCV 91.9 90.8  MCH 30.6 30.6  MCHC 33.3 33.8  RDW 13.3 13.3  PLT 334 324    Cardiac Enzymes Recent Labs Lab 03/18/16 1742 03/18/16 2222 03/19/16 0407  TROPONINI 0.29* 1.25* 0.87*   No results for input(s): TROPIPOC in the last 168 hours.   BNPNo results for input(s): BNP, PROBNP in the last 168 hours.   DDimer  Recent Labs Lab 03/18/16 2204  DDIMER 0.64*     Radiology    Dg Chest 2 View  Result Date: 03/18/2016 CLINICAL DATA:  Shortness of breath EXAM: CHEST  2 VIEW COMPARISON:  07/31/2008 FINDINGS: No acute pulmonary infiltrate or effusion. Heart size borderline enlarged. No pneumothorax. Surgical clips in the right upper quadrant. IMPRESSION: 1. No acute infiltrate or edema 2. Borderline cardiomegaly Electronically Signed  By: Donavan Foil M.D.   On: 03/18/2016 23:52    Cardiac Studies   TTE: Pending  Patient Profile     56 y.o. female with PMH of HTN, HLD and diet controlled DM who presented with exertional dyspnea with climbing stairs, and noted to have an elevated troponin.   Assessment & Plan    1. Exertional dyspnea: Reports she climbs stairs everyday to her apt, but has noticed in the past couple of weeks she is becoming more dyspneic with this activity. Yesterday climbed stairs, felt hot, had a brief episode of syncope and brought to [facility]. EKG nonacute with 1st degree AVB, trop 0.29>>1.25>>0.87.  -- currently on IV heparin, planned for cardiac catheterization today  2. HTN: Hypertensive on arrival, but now improved. On ARB, and amlodipine, BB  3. HLD: On statin  Signed, Reino Bellis, NP  03/19/2016, 8:00 AM    Patient seen and examined. Agree with assessment and plan. ECG reveals sinus bradycardia with first degree block without STT changes.  With change in exertional symptoms, cardiac risk factors and mildly positive troponins following syncope agree with plans for  definitive cath. I have reviewed the risks, indications, and alternatives to cardiac catheterization, possible angioplasty, and stenting with the patient. Risks include but are not limited to bleeding, infection, vascular injury, stroke, myocardial infection, arrhythmia, kidney injury, radiation-related injury in the case of prolonged fluoroscopy use, emergency cardiac surgery, and death. The patient understands the risks of serious complication is 1-2 in [id number] with diagnostic cardiac cath and 1-2% or less with angioplasty/stenting. Plan later today.   Troy Sine, MD, [facility] 03/19/2016 8:16 AM

## 2016-03-20 ENCOUNTER — Encounter (HOSPITAL_COMMUNITY): Payer: Self-pay | Admitting: Cardiology

## 2016-03-20 ENCOUNTER — Telehealth: Payer: Self-pay | Admitting: Family

## 2016-03-20 DIAGNOSIS — I10 Essential (primary) hypertension: Secondary | ICD-10-CM

## 2016-03-20 DIAGNOSIS — E119 Type 2 diabetes mellitus without complications: Secondary | ICD-10-CM

## 2016-03-20 DIAGNOSIS — I214 Non-ST elevation (NSTEMI) myocardial infarction: Secondary | ICD-10-CM

## 2016-03-20 LAB — BASIC METABOLIC PANEL
ANION GAP: 9 (ref 5–15)
BUN: 12 mg/dL (ref 6–20)
CHLORIDE: 106 mmol/L (ref 101–111)
CO2: 21 mmol/L — ABNORMAL LOW (ref 22–32)
Calcium: 9.2 mg/dL (ref 8.9–10.3)
Creatinine, Ser: 1.2 mg/dL — ABNORMAL HIGH (ref 0.44–1.00)
GFR, EST AFRICAN AMERICAN: 57 mL/min — AB (ref >60)
GFR, EST NON AFRICAN AMERICAN: 49 mL/min — AB (ref >60)
Glucose, Bld: 107 mg/dL — ABNORMAL HIGH (ref 65–99)
POTASSIUM: 4 mmol/L (ref 3.5–5.1)
SODIUM: 136 mmol/L (ref 135–145)

## 2016-03-20 LAB — CBC
HCT: 39.3 % (ref 36.0–46.0)
HEMOGLOBIN: 12.9 g/dL (ref 12.0–15.0)
MCH: 29.9 pg (ref 26.0–34.0)
MCHC: 32.8 g/dL (ref 30.0–36.0)
MCV: 91.2 fL (ref 78.0–100.0)
Platelets: 308 10*3/uL (ref 150–400)
RBC: 4.31 MIL/uL (ref 3.87–5.11)
RDW: 13.3 % (ref 11.5–15.5)
WBC: 7.8 10*3/uL (ref 4.0–10.5)

## 2016-03-20 LAB — LIPID PANEL
CHOLESTEROL: 89 mg/dL (ref 0–200)
HDL: 24 mg/dL — AB (ref >40)
LDL Cholesterol: 47 mg/dL (ref 0–99)
TRIGLYCERIDES: 90 mg/dL (ref <150)
Total CHOL/HDL Ratio: 3.7 RATIO
VLDL: 18 mg/dL (ref 0–40)

## 2016-03-20 LAB — GLUCOSE, CAPILLARY: GLUCOSE-CAPILLARY: 111 mg/dL — AB (ref 65–99)

## 2016-03-20 LAB — TSH: TSH: 1.411 u[IU]/mL (ref 0.350–4.500)

## 2016-03-20 MED FILL — Heparin Sodium (Porcine) 2 Unit/ML in Sodium Chloride 0.9%: INTRAMUSCULAR | Qty: 1000 | Status: AC

## 2016-03-20 MED FILL — Verapamil HCl IV Soln 2.5 MG/ML: INTRAVENOUS | Qty: 2 | Status: AC

## 2016-03-20 NOTE — Progress Notes (Signed)
Progress Note  Patient Name: Kaitlyn Harper Date of Encounter: 03/20/2016  Primary Cardiologist: Dr. Percival Spanish  Subjective   No chest pain, or dyspnea.   Inpatient Medications    Scheduled Meds: . amLODipine  5 mg Oral Daily  . ARIPiprazole  5 mg Oral Daily  . aspirin EC  325 mg Oral Daily  . atorvastatin  20 mg Oral Daily  . irbesartan  150 mg Oral Daily  . lamoTRIgine  100 mg Oral q morning - 10a  . lamoTRIgine  150 mg Oral QPM  . nebivolol  20 mg Oral Daily  . potassium chloride  20 mEq Oral Daily  . sodium chloride flush  3 mL Intravenous Q12H  . sodium chloride flush  3 mL Intravenous Q12H   Continuous Infusions:  PRN Meds: sodium chloride, acetaminophen **OR** acetaminophen, sodium chloride flush   Vital Signs    Vitals:   03/19/16 1806 03/19/16 1930 03/19/16 2159 03/20/16 0423  BP: 130/76 134/76 (!) 147/89 131/79  Pulse: (!) 57 65 72 69  Resp: 20  18 18   Temp: 98.6 F (37 C)  97.8 F (36.6 C) 98.1 F (36.7 C)  TempSrc: Oral  Oral Oral  SpO2: 99%  97% 99%  Weight:    173 lb 11.2 oz (78.8 kg)  Height:        Intake/Output Summary (Last 24 hours) at 03/20/16 0704 Last data filed at 03/20/16 0300  Gross per 24 hour  Intake                0 ml  Output                0 ml  Net                0 ml   Filed Weights   03/18/16 2146 03/19/16 1521 03/20/16 0423  Weight: 175 lb 8 oz (79.6 kg) 172 lb 3.2 oz (78.1 kg) 173 lb 11.2 oz (78.8 kg)    Telemetry    SB - Personally Reviewed  ECG    SR with TWI aVL and v1- Personally Reviewed  Physical Exam   GEN: Pleasant AAF, No acute distress.   Neck: No JVD Cardiac: RRR, no murmurs, rubs, or gallops.  Respiratory: Clear to auscultation bilaterally. GI: Soft, nontender, non-distended  MS: No edema; No deformity. Right groin without bruising or hematoma.  Neuro:  Nonfocal  Psych: Normal affect   Labs    Chemistry  Recent Labs Lab 03/18/16 1742 03/19/16 0407 03/20/16 0316  NA 140 140 136  K  3.1* 4.1 4.0  CL 105 107 106  CO2 26 24 21*  GLUCOSE 112* 114* 107*  BUN 10 8 12   CREATININE 1.12* 1.02* 1.20*  CALCIUM 9.2 9.5 9.2  GFRNONAA 53* 60* 49*  GFRAA >60 >60 57*  ANIONGAP 9 9 9      Hematology  Recent Labs Lab 03/18/16 1742 03/19/16 0407 03/20/16 0316  WBC 6.9 9.4 7.8  RBC 4.18 4.34 4.31  HGB 12.8 13.3 12.9  HCT 38.4 39.4 39.3  MCV 91.9 90.8 91.2  MCH 30.6 30.6 29.9  MCHC 33.3 33.8 32.8  RDW 13.3 13.3 13.3  PLT 334 324 308    Cardiac Enzymes  Recent Labs Lab 03/18/16 1742 03/18/16 2222 03/19/16 0407 03/19/16 1048  TROPONINI 0.29* 1.25* 0.87* 0.59*   No results for input(s): TROPIPOC in the last 168 hours.   BNPNo results for input(s): BNP, PROBNP in the last 168 hours.  DDimer   Recent Labs Lab 03/18/16 2204  DDIMER 0.64*    Lipid Panel     Component Value Date/Time   CHOL 105 01/24/2015 1647   TRIG 87.0 01/24/2015 1647   HDL 31.90 (L) 01/24/2015 1647   CHOLHDL 3 01/24/2015 1647   VLDL 17.4 01/24/2015 1647   LDLCALC 55 01/24/2015 1647    Radiology    Dg Chest 2 View  Result Date: 03/18/2016 CLINICAL DATA:  Shortness of breath EXAM: CHEST  2 VIEW COMPARISON:  07/31/2008 FINDINGS: No acute pulmonary infiltrate or effusion. Heart size borderline enlarged. No pneumothorax. Surgical clips in the right upper quadrant. IMPRESSION: 1. No acute infiltrate or edema 2. Borderline cardiomegaly Electronically Signed   By: Donavan Foil M.D.   On: 03/18/2016 23:52    Cardiac Studies   LHC: 03/19/16    The left ventricular systolic function is normal.  LV end diastolic pressure is normal.  The left ventricular ejection fraction is 55-65% by visual estimate.   1. Normal coronary anatomy 2. Normal LV function 3. Normal LVEDP.  Plan: medical management.     TTE: 03/19/16  Study Conclusions  - Left ventricle: The cavity size was normal. There was mild focal   basal hypertrophy of the septum. Systolic function was vigorous.   The  estimated ejection fraction was in the range of 65% to 70%.   Wall motion was normal; there were no regional wall motion   abnormalities. Doppler parameters are consistent with abnormal   left ventricular relaxation (grade 1 diastolic dysfunction).   Doppler parameters are consistent with elevated ventricular   end-diastolic filling pressure. - Aortic valve: There was no regurgitation. - Aortic root: The aortic root was normal in size. - Mitral valve: There was trivial regurgitation. - Right ventricle: The cavity size was normal. Wall thickness was   normal. Systolic function was normal. - Tricuspid valve: There was trivial regurgitation. - Pulmonary arteries: Systolic pressure was within the normal   range. - Inferior vena cava: The vessel was normal in size.  Impressions:  - Normal LVEF, no wall motion abnormalities.   Impaired relaxation with increased filling pressures.  Patient Profile     56 y.o. female with PMH of HTN, HLD and diet controlled DM who presented with exertional dyspnea with climbing stairs, and noted to have an elevated troponin. Underwent LHC yesterday.   Assessment & Plan    1. Exertional dyspnea: Reports she climbs stairs everyday to her apt, but has noticed in the past couple of weeks she is becoming more dyspneic with this activity. The day of admission she climbed stairs, felt hot, had a brief episode of syncope and brought to [facility]. EKG nonacute with 1st degree AVB, trop 0.29>>1.25>>0.87.  -- underwent cath yesterday with Dr. Martinique noting normal coronaries. Echo showed normal EF without WMA, and G1DD. Cr with slight bump post procedure. 1.02>>1.20 -- D dimer was mildly elevated this admission, but low suspicion for PE. Suspect there could be some underlying deconditioning contributing to her dyspnea as she has a hx of smoking.   2. HTN: Stable, on ARB, and amlodipine, BB  3. HLD: On statin  Signed, Reino Bellis, NP  03/20/2016, 7:04 AM      Patient seen and examined. Agree with assessment and plan. R groin cath site is stable. No chest pain. Cath data reviewed. Normal coronaries, LVfxn. ECG NSR at 80 without STT changes. BP controlled on irbesartan, bystolic and amlodipine.  Grade I DD on echo. Will sign  off.   Troy Sine, MD, [facility] 03/20/2016 11:05 AM

## 2016-03-20 NOTE — Telephone Encounter (Signed)
Called pt. Lvm. Spoke with pt on 03/14/16 per previous note. Pt stated that she will call us back when ready to schedule F/U appt.

## 2016-03-20 NOTE — Telephone Encounter (Signed)
Tried pt again, pt said that she would like to be seen sooner than later but doesn't get off work until 3p. Scheduled pt to come in on Friday at 3:15. Pt says that she will try to make her appt on time. But wanted to go ahead and schedule.    Please advise, is scheduling time okay with PCP?

## 2016-03-20 NOTE — Progress Notes (Signed)
Kaitlyn Harper getting discharged from [facility] today (2/618). Her daughter is to come pick her up from the hospital to give her a ride home.   [facility]

## 2016-03-20 NOTE — Telephone Encounter (Signed)
Ok, please try again tomorrow. Pt's previous response was regarding a routine follow up and we are now needing to see her for a hospital follow up. Thanks!

## 2016-03-20 NOTE — Progress Notes (Signed)
Pt's IV Saline lock d/c'd from left upper forearm. Pressure applied/ guaze and tape. Pt's d/c paperwork reviewed - medication list and other resource information provided. Pt denied any questions and verbalized understanding. Pt to main entrance via w/c with d/c volunteer. Daughter to assist pt to her home.--JM

## 2016-03-20 NOTE — Progress Notes (Signed)
PROGRESS NOTE    Kaitlyn Harper  [id number] DOB: 04-Jun-1960 DOA: 03/18/2016 PCP: Kaitlyn Pear., NP  Brief Narrative: Kaitlyn Harper is a 56 y.o. female with a past medical history significant for HTN, depression, diet-controlled DM who presented with SOB for 1 week and now syncope. 2/4 night after work, she climbed 3 flights of stairs, felt extremely short of breath, much more than usual. She then left and went to her son's house, where she climbed one flight of stairs, and at the top of the stairs felt sweaty, "not good", and passed out. In ED, EKG unchanged, Troponin elevated, Cards consulting. She is s/p LHC on 03/19/16 with normal coronaires. She is asymptomatic at this time.   Assessment & Plan:  1. Syncope /NSTEMI:  -tele with first degree AVB -ECHo with normal EF and wall motion, d dimer very mildly elevated but clinically do not suspect PE at this time, no hypoxia, tachycardia, ECHO with Normal EF/wall motion, normal RV and normal PA pressures -LHC- normal  -continue Aspirin, statin, BB  2. Hypokalemia:  -replaced, mag 1.9  3. Hypertension:  -Continue Bystolic and amlodipine, valsartan  4. Depression:  -Continue Lamictal and Abilify  DVT prophylaxis: SCDs Code Status: Full Family Communication: Patient comprehends her symptoms well.  Disposition Plan: Discharge today.    Consultants:   Cards   Subjective: She states she feels well and doesn't have any complaints at all at this time. Her sob has resolved as well.   Objective: Vitals:   03/19/16 1930 03/19/16 2159 03/20/16 0423 03/20/16 1058  BP: 134/76 (!) 147/89 131/79 138/80  Pulse: 65 72 69   Resp:  18 18   Temp:  97.8 F (36.6 C) 98.1 F (36.7 C)   TempSrc:  Oral Oral   SpO2:  97% 99%   Weight:   78.8 kg (173 lb 11.2 oz)   Height:        Intake/Output Summary (Last 24 hours) at 03/20/16 1255 Last data filed at 03/20/16 0300  Gross per 24 hour  Intake                0 ml    Output                0 ml  Net                0 ml   Filed Weights   03/18/16 2146 03/19/16 1521 03/20/16 0423  Weight: 79.6 kg (175 lb 8 oz) 78.1 kg (172 lb 3.2 oz) 78.8 kg (173 lb 11.2 oz)    Examination:  General exam: Appears calm and comfortable, AAOx3, no distress Respiratory system: Clear to auscultation. Respiratory effort normal. Cardiovascular system: S1 & S2 heard, RRR. No JVD, murmurs, Gastrointestinal system: Abdomen is nondistended, soft and nontender. Normal bowel sounds heard. Central nervous system: Alert and oriented. No focal neurological deficits. Extremities: Symmetric 5 x 5 power. Skin: No rashes, lesions or ulcers Psychiatry: Judgement and insight appear normal. Mood & affect appropriate.     Data Reviewed: I have personally reviewed following labs and imaging studies  CBC:  Recent Labs Lab 03/18/16 1742 03/19/16 0407 03/20/16 0316  WBC 6.9 9.4 7.8  HGB 12.8 13.3 12.9  HCT 38.4 39.4 39.3  MCV 91.9 90.8 91.2  PLT 334 324 [id number]   Basic Metabolic Panel:  Recent Labs Lab 03/18/16 1742 03/18/16 2222 03/19/16 0407 03/20/16 0316  NA 140  --  140 136  K 3.1*  --  4.1  4.0  CL 105  --  107 106  CO2 26  --  24 21*  GLUCOSE 112*  --  114* 107*  BUN 10  --  8 12  CREATININE 1.12*  --  1.02* 1.20*  CALCIUM 9.2  --  9.5 9.2  MG  --  1.9  --   --    GFR: Estimated Creatinine Clearance: 52.5 mL/min (by C-G formula based on SCr of 1.2 mg/dL (H)). Liver Function Tests: No results for input(s): AST, ALT, ALKPHOS, BILITOT, PROT, ALBUMIN in the last 168 hours. No results for input(s): LIPASE, AMYLASE in the last 168 hours. No results for input(s): AMMONIA in the last 168 hours. Coagulation Profile:  Recent Labs Lab 03/19/16 1203  INR 1.13   Cardiac Enzymes:  Recent Labs Lab 03/18/16 1742 03/18/16 2222 03/19/16 0407 03/19/16 1048  TROPONINI 0.29* 1.25* 0.87* 0.59*   BNP (last 3 results) No results for input(s): PROBNP in the last 8760  hours. HbA1C: No results for input(s): HGBA1C in the last 72 hours. CBG:  Recent Labs Lab 03/19/16 1716 03/20/16 0700  GLUCAP 81 111*   Lipid Profile:  Recent Labs  03/20/16 0958  CHOL 89  HDL 24*  LDLCALC 47  TRIG 90  CHOLHDL 3.7   Thyroid Function Tests:  Recent Labs  03/20/16 0958  TSH 1.411   Anemia Panel: No results for input(s): VITAMINB12, FOLATE, FERRITIN, TIBC, IRON, RETICCTPCT in the last 72 hours. Urine analysis:    Component Value Date/Time   COLORURINE YELLOW 03/18/2016 1710   APPEARANCEUR CLEAR 03/18/2016 1710   LABSPEC 1.008 03/18/2016 1710   PHURINE 7.5 03/18/2016 1710   GLUCOSEU NEGATIVE 03/18/2016 1710   GLUCOSEU NEGATIVE 01/24/2015 1647   HGBUR NEGATIVE 03/18/2016 1710   BILIRUBINUR NEGATIVE 03/18/2016 1710   BILIRUBINUR ++ 02/18/2015 1450   KETONESUR NEGATIVE 03/18/2016 1710   PROTEINUR 30 (A) 03/18/2016 1710   UROBILINOGEN negative 02/18/2015 1450   UROBILINOGEN 0.2 01/24/2015 1647   NITRITE NEGATIVE 03/18/2016 1710   LEUKOCYTESUR NEGATIVE 03/18/2016 1710   Sepsis Labs: @LABRCNTIP (procalcitonin:4,lacticidven:4)  )No results found for this or any previous visit (from the past 240 hour(s)).       Radiology Studies: Dg Chest 2 View  Result Date: 03/18/2016 CLINICAL DATA:  Shortness of breath EXAM: CHEST  2 VIEW COMPARISON:  07/31/2008 FINDINGS: No acute pulmonary infiltrate or effusion. Heart size borderline enlarged. No pneumothorax. Surgical clips in the right upper quadrant. IMPRESSION: 1. No acute infiltrate or edema 2. Borderline cardiomegaly Electronically Signed   By: Donavan Foil M.D.   On: 03/18/2016 23:52        Scheduled Meds: . amLODipine  5 mg Oral Daily  . ARIPiprazole  5 mg Oral Daily  . aspirin EC  325 mg Oral Daily  . atorvastatin  20 mg Oral Daily  . irbesartan  150 mg Oral Daily  . lamoTRIgine  100 mg Oral q morning - 10a  . lamoTRIgine  150 mg Oral QPM  . nebivolol  20 mg Oral Daily  . potassium  chloride  20 mEq Oral Daily  . sodium chloride flush  3 mL Intravenous Q12H  . sodium chloride flush  3 mL Intravenous Q12H   Continuous Infusions:    LOS: 2 days    Time spent: 10min    Gerlean Ren, MD [facility]  If 7PM-7AM, please contact night-coverage [email or web address] Password [email or web address] 03/20/2016, 12:55 PM

## 2016-03-20 NOTE — Telephone Encounter (Signed)
Please contact pt to arrange hospital follow up with me.  

## 2016-03-20 NOTE — Discharge Summary (Signed)
Physician Discharge Summary  Kaitlyn Harper [id number] DOB: [birth year] DOA: 03/18/2016  PCP: Nance Pear., NP  Admit date: 03/18/2016 Discharge date: 03/20/2016  Admitted From: Home Disposition:  Home  Recommendations for Outpatient Follow-up:  1. Follow up with PCP in 1-2 weeks   Home Health: No Equipment/Devices: None Discharge Condition: Stable  CODE STATUS:Full  Diet recommendation: 2g Na diet   Brief/Interim Summary: Kaitlyn A Lylesis a 56 y.o.femalewith a past medical history significant for HTN, depression, diet-controlled DMwho presented with SOB for 1 week and now syncope. 2/4 night after work, she climbed 3 flights of stairs, felt extremely short of breath, much more than usual. She then left and went to her son's house, where she climbed one flight of stairs, and at the top of the stairs felt sweaty, "not good", and passed out. In ED, EKG unchanged, Troponin elevated, Cards consulted. She is s/p LHC on 03/19/16 with normal coronaires. She is asymptomatic at this time. Rest of her labs remained unremarkable including  TSH and LDL. No further changes in her meds. Encouraged to drink plently of water to keep herself hydrated.  Deemed stable to be discharged with outpatient follow up.   Discharge Diagnoses:  Principal Problem:   Syncope Active Problems:   Anxiety and depression   Essential hypertension   Diabetes type 2, controlled (HCC)   Hypokalemia   Elevated troponin   NSTEMI (non-ST elevated myocardial infarction) ([place])  1. Syncope /NSTEMI: -tele with first degree AVB -ECHo with normal EF and wall motion, d dimer very mildly elevated but clinically do not suspect PE at this time, no hypoxia, tachycardia, ECHO with Normal EF/wall motion, normal RV and normal PA pressures -LHC- normal 03/19/16 -continue Aspirin, statin, BB  2. Hypokalemia:  -replaced, mag 1.9  3. Hypertension: -Continue Bystolic and amlodipine, valsartan  4.  Depression: -Continue Lamictal and Abilify   Discharge Instructions  Discharge Instructions    AMB Referral to [facility] [facility] Management    Complete by:  As directed    Please assign UMR member for post discharge call. Provided [employer] brochure for DM management. Will need to enroll for DM management. Currently at [facility]. Please call with questions. Thanks. Marthenia Rolling, [place], RN,BSN [facility] Liaison-[phone]   Reason for consult:  Please assign UMR member for post discharge call   Diagnoses of:  Diabetes   Expected date of contact:  1-3 days (reserved for hospital discharges)     Allergies as of 03/20/2016      Reactions   Sulfonamide Derivatives Hives, Other (See Comments)   Light sensitivity      Medication List    STOP taking these medications   nystatin cream Commonly known as:  MYCOSTATIN     TAKE these medications   amLODipine-valsartan 5-160 MG tablet Commonly known as:  EXFORGE TAKE 1 TABLET BY MOUTH DAILY.   ARIPiprazole 5 MG tablet Commonly known as:  ABILIFY Take 5 mg by mouth daily.   aspirin EC 81 MG tablet Take 81 mg by mouth daily.   atorvastatin 20 MG tablet Commonly known as:  LIPITOR TAKE 1 TABLET BY MOUTH DAILY   BYSTOLIC 20 MG Tabs Generic drug:  Nebivolol HCl TAKE 1 TABLET (20 MG TOTAL) BY MOUTH DAILY.   lamoTRIgine 100 MG tablet Commonly known as:  LAMICTAL Take 100-150 mg by mouth See admin instructions. Take 100 mg by mouth in the morning and take 150 mg by mouth at night   potassium chloride 20 MEQ/15ML (10%)  Soln Take 15 mLs (20 mEq total) by mouth daily.       Allergies  Allergen Reactions  . Sulfonamide Derivatives Hives and Other (See Comments)    Light sensitivity    Consultations:  Dr Claiborne Billings from Cardiology.    Procedures/Studies: Dg Chest 2 View  Result Date: 03/18/2016 CLINICAL DATA:  Shortness of breath EXAM: CHEST  2 VIEW COMPARISON:  07/31/2008 FINDINGS: No acute pulmonary infiltrate or  effusion. Heart size borderline enlarged. No pneumothorax. Surgical clips in the right upper quadrant. IMPRESSION: 1. No acute infiltrate or edema 2. Borderline cardiomegaly Electronically Signed   By: Donavan Foil M.D.   On: 03/18/2016 23:52       Subjective:   Discharge Exam: Vitals:   03/20/16 0423 03/20/16 1058  BP: 131/79 138/80  Pulse: 69   Resp: 18   Temp: 98.1 F (36.7 C)    Vitals:   03/19/16 1930 03/19/16 2159 03/20/16 0423 03/20/16 1058  BP: 134/76 (!) 147/89 131/79 138/80  Pulse: 65 72 69   Resp:  18 18   Temp:  97.8 F (36.6 C) 98.1 F (36.7 C)   TempSrc:  Oral Oral   SpO2:  97% 99%   Weight:   78.8 kg (173 lb 11.2 oz)   Height:        General: Pt is alert, awake, not in acute distress Cardiovascular: RRR, S1/S2 +, no rubs, no gallops Respiratory: CTA bilaterally, no wheezing, no rhonchi Abdominal: Soft, NT, ND, bowel sounds + Extremities: no edema, no cyanosis    The results of significant diagnostics from this hospitalization (including imaging, microbiology, ancillary and laboratory) are listed below for reference.     Microbiology: No results found for this or any previous visit (from the past 240 hour(s)).   Labs: BNP (last 3 results) No results for input(s): BNP in the last 8760 hours. Basic Metabolic Panel:  Recent Labs Lab 03/18/16 1742 03/18/16 2222 03/19/16 0407 03/20/16 0316  NA 140  --  140 136  K 3.1*  --  4.1 4.0  CL 105  --  107 106  CO2 26  --  24 21*  GLUCOSE 112*  --  114* 107*  BUN 10  --  8 12  CREATININE 1.12*  --  1.02* 1.20*  CALCIUM 9.2  --  9.5 9.2  MG  --  1.9  --   --    Liver Function Tests: No results for input(s): AST, ALT, ALKPHOS, BILITOT, PROT, ALBUMIN in the last 168 hours. No results for input(s): LIPASE, AMYLASE in the last 168 hours. No results for input(s): AMMONIA in the last 168 hours. CBC:  Recent Labs Lab 03/18/16 1742 03/19/16 0407 03/20/16 0316  WBC 6.9 9.4 7.8  HGB 12.8 13.3 12.9   HCT 38.4 39.4 39.3  MCV 91.9 90.8 91.2  PLT 334 324 308   Cardiac Enzymes:  Recent Labs Lab 03/18/16 1742 03/18/16 2222 03/19/16 0407 03/19/16 1048  TROPONINI 0.29* 1.25* 0.87* 0.59*   BNP: Invalid input(s): POCBNP CBG:  Recent Labs Lab 03/19/16 1716 03/20/16 0700  GLUCAP 81 111*   D-Dimer  Recent Labs  03/18/16 2204  DDIMER 0.64*   Hgb A1c No results for input(s): HGBA1C in the last 72 hours. Lipid Profile  Recent Labs  03/20/16 0958  CHOL 89  HDL 24*  LDLCALC 47  TRIG 90  CHOLHDL 3.7   Thyroid function studies  Recent Labs  03/20/16 0958  TSH 1.411   Anemia work up No  results for input(s): VITAMINB12, FOLATE, FERRITIN, TIBC, IRON, RETICCTPCT in the last 72 hours. Urinalysis    Component Value Date/Time   COLORURINE YELLOW 03/18/2016 1710   APPEARANCEUR CLEAR 03/18/2016 1710   LABSPEC 1.008 03/18/2016 1710   PHURINE 7.5 03/18/2016 1710   GLUCOSEU NEGATIVE 03/18/2016 1710   GLUCOSEU NEGATIVE 01/24/2015 1647   HGBUR NEGATIVE 03/18/2016 1710   BILIRUBINUR NEGATIVE 03/18/2016 1710   BILIRUBINUR ++ 02/18/2015 1450   KETONESUR NEGATIVE 03/18/2016 1710   PROTEINUR 30 (A) 03/18/2016 1710   UROBILINOGEN negative 02/18/2015 1450   UROBILINOGEN 0.2 01/24/2015 1647   NITRITE NEGATIVE 03/18/2016 1710   LEUKOCYTESUR NEGATIVE 03/18/2016 1710   Sepsis Labs Invalid input(s): PROCALCITONIN,  WBC,  LACTICIDVEN Microbiology No results found for this or any previous visit (from the past 240 hour(s)).   Time coordinating discharge: Over 30 minutes  SIGNED:   Damita Lack, MD  [facility] 03/20/2016, 1:10 PM Pager   If 7PM-7AM, please contact night-coverage [email or web address] Password [email or web address]

## 2016-03-21 LAB — HEMOGLOBIN A1C
HEMOGLOBIN A1C: 5.7 % — AB (ref 4.8–5.6)
Mean Plasma Glucose: 117 mg/dL

## 2016-03-21 NOTE — Telephone Encounter (Signed)
Yes

## 2016-03-22 ENCOUNTER — Other Ambulatory Visit: Payer: Self-pay | Admitting: *Deleted

## 2016-03-22 ENCOUNTER — Telehealth: Payer: Self-pay | Admitting: Behavioral Health

## 2016-03-22 NOTE — Patient Outreach (Signed)
[facility]) Care Management  03/22/2016  Kaitlyn Harper Jan 14, 1961 [id number]   Subjective: Telephone call to patient's home / mobile number, no answer, left HIPAA compliant voicemail message, and requested call back.    Objective: Per chart review, patient hospitalized  03/18/16 - 03/20/16 for Syncope and NSTEMI (non-ST elevated myocardial infarction).   Patient also has a history of hypertension, diabetes, anxiety, and depression.   Assessment: Received UMR Transition of care referral on  03/19/16.   Transition of care follow up pending patient contact.      Plan: RNCM will call patient for 2nd telephone outreach attempt, transition of care follow up, within 10 business days, if no return call.   Kaitlyn Harper H. Annia Friendly, BSN, [employer] Management [facility] Telephonic CM Phone: [phone] Fax: [phone]

## 2016-03-22 NOTE — Telephone Encounter (Signed)
Transition Care Management Follow-up Telephone Call  PCP: Nance Pear., NP  Admit date: 03/18/2016 Discharge date: 03/20/2016  Admitted From: Home Disposition:  Home  Recommendations for Outpatient Follow-up:  1. Follow up with PCP in 1-2 weeks   How have you been since you were released from the hospital? Patient stated, "I've been fine, no shortness of breath, feeling like myself again".   Do you understand why you were in the hospital? yes   Do you understand the discharge instructions? yes   Where were you discharged to? Home   Items Reviewed:  Medications reviewed: yes  Allergies reviewed: yes  Dietary changes reviewed: yes, carb-modified diet  Referrals reviewed: yes, Follow up with PCP in 1-2 weeks   Functional Questionnaire:   Activities of Daily Living (ADLs):   She states they are independent in the following: ambulation, bathing and hygiene, feeding, continence, grooming, toileting and dressing States they require assistance with the following: None   Any transportation issues/concerns?: no   Any patient concerns? no   Confirmed importance and date/time of follow-up visits scheduled yes, 03/23/16 at 3:15 PM.  Provider Appointment booked with Debbrah Alar, NP.  Confirmed with patient if condition begins to worsen call PCP or go to the ER.  Patient was given the office number and encouraged to call back with question or concerns.  : yes

## 2016-03-23 ENCOUNTER — Other Ambulatory Visit: Payer: Self-pay | Admitting: *Deleted

## 2016-03-23 ENCOUNTER — Telehealth: Payer: Self-pay | Admitting: *Deleted

## 2016-03-23 ENCOUNTER — Encounter: Payer: Self-pay | Admitting: Family

## 2016-03-23 ENCOUNTER — Ambulatory Visit (INDEPENDENT_AMBULATORY_CARE_PROVIDER_SITE_OTHER): Payer: 59 | Admitting: Family

## 2016-03-23 ENCOUNTER — Other Ambulatory Visit: Payer: Self-pay | Admitting: Family

## 2016-03-23 ENCOUNTER — Ambulatory Visit (HOSPITAL_BASED_OUTPATIENT_CLINIC_OR_DEPARTMENT_OTHER)
Admission: RE | Admit: 2016-03-23 | Discharge: 2016-03-23 | Disposition: A | Discharge status: Home / Self Care | Payer: 59 | Source: Ambulatory Visit | Attending: Family | Admitting: Family

## 2016-03-23 VITALS — BP 144/77 | HR 77 | Temp 98.4°F | Resp 16 | Ht 64.0 in | Wt 178.2 lb

## 2016-03-23 DIAGNOSIS — R55 Syncope and collapse: Secondary | ICD-10-CM | POA: Diagnosis not present

## 2016-03-23 DIAGNOSIS — R7989 Other specified abnormal findings of blood chemistry: Secondary | ICD-10-CM | POA: Insufficient documentation

## 2016-03-23 DIAGNOSIS — R0602 Shortness of breath: Secondary | ICD-10-CM | POA: Diagnosis not present

## 2016-03-23 DIAGNOSIS — I214 Non-ST elevation (NSTEMI) myocardial infarction: Secondary | ICD-10-CM

## 2016-03-23 MED ORDER — IOPAMIDOL (ISOVUE-370) INJECTION 76%
100.0000 mL | Freq: Once | INTRAVENOUS | Status: AC | PRN
Start: 2016-03-23 — End: 2016-03-23
  Administered 2016-03-23: 100 mL via INTRAVENOUS

## 2016-03-23 MED FILL — ATORVASTATIN 20 MG TABLET: 20 | 90 days supply | Qty: 90 | Fill #0

## 2016-03-23 MED FILL — AMLODIPINE-VALSARTAN 5-160: 5-160 | 30 days supply | Qty: 30 | Fill #4

## 2016-03-23 NOTE — Telephone Encounter (Signed)
Pt was seen in the office today and asked if we had received her papers for work from [employer]. I have checked in Ashlee's office and did not see any pending forms. Per verbal from PCP, she has not seen any forms on this pt. Alex in radiology will notify pt and have her contact HR to have them re-send the forms.

## 2016-03-23 NOTE — Patient Instructions (Signed)
Please complete CT angio on the first floor.

## 2016-03-23 NOTE — Progress Notes (Signed)
Pre visit review using our clinic review tool, if applicable. No additional management support is needed unless otherwise documented below in the visit note. 

## 2016-03-23 NOTE — Telephone Encounter (Signed)
Received call from [facility] in radiology that CT angio result was available. Advised he could tell pt that no blood clot was seen. Please advise if you have any further recommendations?

## 2016-03-23 NOTE — Assessment & Plan Note (Signed)
Clinically stable. Continue beta blocker, aspirin and lipitor. Will arrange outpatient follow up with cardiology.

## 2016-03-23 NOTE — Progress Notes (Signed)
Subjective:    Patient ID: Kaitlyn Harper, female    DOB: 08/21/1960, 56 y.o.   MRN: [id number]  HPI  Kaitlyn Harper is a 56 yr old female who presents today for hospital follow up. Discharge summary is reviewed.  She was admitted 2/4-2/6 with complaint of sob x 1 week followed by syncope.  She had a elevated tryponin during her hospitalization and cardiology was consulted. They completed a left heart cath on 2/5 which revealed normal coronaries.  She did have some hypokalemia during her stay which was repleted.  She was continued on aspirin, statin and beta blocker.  LVEF was normal. D dimer was elevated.    Since returning home she denies sob/CP, swelling. Feeling well.   Review of Systems    see HPI  Past Medical History:  Diagnosis Date  . Anemia   . Anxiety   . Depression   . Diabetes mellitus without complication ([place])    type 2-diet controlled  . Hyperglycemia   . Hyperlipidemia   . Hypertension      Social History   Social History  . Marital status: Divorced    Spouse name: N/A  . Number of children: 3  . Years of education: N/A   Occupational History  . RN [place]   Social History Main Topics  . Smoking status: Former Smoker    Packs/day: 0.25    Types: Cigarettes    Quit date: 07/02/2015  . Smokeless tobacco: Never Used     Comment: 10 cigarettes daily  . Alcohol use 0.0 oz/week     Comment: socially  . Drug use: No  . Sexual activity: Yes    Birth control/ protection: Post-menopausal   Other Topics Concern  . Not on file   Social History Narrative   Caffeine use:  2 drinks   Regular exercise: no          Past Surgical History:  Procedure Laterality Date  . APPENDECTOMY  1985  . CHOLECYSTECTOMY  1985  . COLONOSCOPY  02-28-2011   4 polyps   . DILATION AND CURETTAGE OF UTERUS  2005  . [place]  2002  . KNEE SURGERY  1983   arthroscopy?  [name] LEFT HEART CATH AND CORONARY ANGIOGRAPHY N/A 03/19/2016   Procedure: Left  Heart Cath and Coronary Angiography;  Surgeon: Peter M Martinique, MD;  Location: [facility];  Service: Cardiovascular;  Laterality: N/A;  . MOUTH SURGERY  03/08/2016  . POLYPECTOMY  2006   ?anal polyp?  [name] POLYPECTOMY  02-28-2011   4 polyps- 3 of them TA   . TEAR DUCT PROBING  2004  . TUBAL LIGATION  1985    Family History  Problem Relation Age of Onset  . Hypertension Father   . Sarcoidosis Brother   . Alcohol abuse Brother   . Cirrhosis Brother   . Stroke Maternal Grandmother   . Colon cancer Neg Hx   . Colon polyps Neg Hx   . Esophageal cancer Neg Hx   . Rectal cancer Neg Hx   . Stomach cancer Neg Hx   . Pulmonary embolism Neg Hx     Allergies  Allergen Reactions  . Sulfonamide Derivatives Hives and Other (See Comments)    Light sensitivity    Current Outpatient Prescriptions on File Prior to Visit  Medication Sig Dispense Refill  . amLODipine-valsartan (EXFORGE) 5-160 MG tablet TAKE 1 TABLET BY MOUTH DAILY. 90 tablet 1  . ARIPiprazole (ABILIFY)  10 MG tablet   2  . aspirin EC 81 MG tablet Take 81 mg by mouth daily.    [name] BYSTOLIC 20 MG TABS TAKE 1 TABLET (20 MG TOTAL) BY MOUTH DAILY. 30 tablet 0  . chlorhexidine (PERIDEX) 0.12 % solution   0  . lamoTRIgine (LAMICTAL) 100 MG tablet Take 100-150 mg by mouth See admin instructions. Take 100 mg by mouth in the morning and take 150 mg by mouth at night  0  . potassium chloride 20 MEQ/15ML (10%) SOLN Take 15 mLs (20 mEq total) by mouth daily. 900 mL 2  . traZODone (DESYREL) 50 MG tablet   2   No current facility-administered medications on file prior to visit.     BP (!) 144/77 (BP Location: Left Arm, Cuff Size: Normal)   Pulse 77   Temp 98.4 F (36.9 C) (Oral)   Resp 16   Ht 5\' 4"  (1.626 m)   Wt 178 lb 3.2 oz (80.8 kg)   LMP 12/25/2008   SpO2 100% Comment: room air  BMI 30.59 kg/m    Objective:   Physical Exam  Constitutional: She is oriented to person, place, and time. She appears well-developed and  well-nourished.  HENT:  Head: Normocephalic and atraumatic.  Cardiovascular: Normal rate, regular rhythm and normal heart sounds.   No murmur heard. Pulmonary/Chest: Effort normal and breath sounds normal. No respiratory distress. She has no wheezes.  Musculoskeletal: She exhibits no edema.  Neurological: She is alert and oriented to person, place, and time.  Skin: Skin is warm.  Psychiatric: She has a normal mood and affect. Her behavior is normal. Judgment and thought content normal.          Assessment & Plan:  Syncope- no further syncope since returning home. I am concerned that she had a positive D dimer but did not have a CT scan to rule out PE.  I have advised pt to complete a CTA this evening on the first floor.

## 2016-03-23 NOTE — Patient Outreach (Signed)
Kaitlyn Harper) Care Management  03/23/2016  Kaitlyn Harper 03-06-1960 [id number]   Subjective: Telephone call to patient's home / mobile number, no answer, left HIPAA compliant voicemail message, and requested call back.    Objective: Per chart review, patient hospitalized  03/18/16 - 03/20/16 for Syncope and NSTEMI (non-ST elevated myocardial infarction).   Patient also has a history of hypertension, diabetes, anxiety, and depression.   Assessment: Received UMR Transition of care referral on  03/19/16. Transition of care follow up pending patient contact.      Plan: RNCM will call patient for 3rd telephone outreach attempt, transition of care follow up, within 10 business days, if no return call.   [name] H. Annia Friendly, BSN, [place] Management [facility] Telephonic CM Phone: [phone] Fax: [phone]

## 2016-03-25 NOTE — Telephone Encounter (Signed)
No further recommendations.

## 2016-03-26 ENCOUNTER — Other Ambulatory Visit: Payer: Self-pay | Admitting: *Deleted

## 2016-03-26 ENCOUNTER — Encounter: Payer: Self-pay | Admitting: *Deleted

## 2016-03-26 NOTE — Telephone Encounter (Signed)
FMLA forms received and forwarded to PCP for review and completion.

## 2016-03-26 NOTE — Patient Outreach (Signed)
[facility]) Care Management  03/26/2016  TYLEEN RECKERS [birth year] [id number]   Subjective: Telephone call to patient's home / mobile number, no answer, left HIPAA compliant voicemail message, and requested call back.    Objective: Per chart review, patient hospitalized 03/18/16 - 03/20/16 for Syncope and NSTEMI (non-ST elevated myocardial infarction). Patient also has a history of hypertension, diabetes, anxiety, and depression.   Assessment: Received UMR Transition of care referral on 03/19/16. Transition of care follow up pending patient contact.    Plan: RNCM will send patient unsuccessful outreach letter, [facility] pamphlet, and proceed with case closure within 10 business days, if no return call.     Maple Odaniel H. Annia Friendly, BSN, [place] Management [facility] Telephonic CM Phone: [phone] Fax: [phone]

## 2016-04-02 MED FILL — traZODone HCL 50 MG TABS: 50 | 30 days supply | Qty: 60 | Fill #2

## 2016-04-02 NOTE — Telephone Encounter (Signed)
FMLA forms faxed to [employer] at [phone]. Message sent to pt.

## 2016-04-03 MED FILL — ARIPiprazole 10 MG TABS: 10 | 30 days supply | Qty: 30 | Fill #0

## 2016-04-03 MED FILL — lamoTRIgine 150 MG TABS: 150 | 30 days supply | Qty: 30 | Fill #0

## 2016-04-09 ENCOUNTER — Other Ambulatory Visit: Payer: Self-pay | Admitting: Family

## 2016-04-09 ENCOUNTER — Other Ambulatory Visit: Payer: Self-pay | Admitting: *Deleted

## 2016-04-09 MED FILL — lamoTRIgine 100 MG TABS: 100 | 30 days supply | Qty: 30 | Fill #2

## 2016-04-09 NOTE — Patient Outreach (Signed)
[facility]) Care Management  04/09/2016  Kaitlyn Harper 1960/09/11 [id number]   No response from patient outreach attempt, received unsuccessful outreach letter via returned mail on 04/03/16, and will proceed with case closure.  Objective: Per chart review, patient hospitalized 03/18/16 - 03/20/16 for Syncope and NSTEMI (non-ST elevated myocardial infarction). Patient also has a history of hypertension, diabetes, anxiety, and depression.   Assessment: Received UMR Transition of care referral on 03/19/16. Transition of care follow up not completed due to patient being unable to contact, and will proceed with case closure.   Plan: RNCM will send case closure due to unable to reach request to [facility].      Clemie General H. Annia Friendly, BSN, [place] Management [facility] Telephonic CM Phone: [phone] Fax: [phone]

## 2016-04-10 MED FILL — BYSTOLIC 20 MG TABLET: 20 | 30 days supply | Qty: 30 | Fill #0

## 2016-04-16 NOTE — Telephone Encounter (Signed)
[employer] states they did not receive the FMLA paperwork for this patient last month. Please fax again to [phone].

## 2016-04-16 NOTE — Telephone Encounter (Signed)
Have -re faxed FMLA forms to [phone] Put forms back in Angie's file cabnet under Melissa's file.

## 2016-04-24 MED FILL — AMLODIPINE-VALSARTAN 5-160: 5-160 | 30 days supply | Qty: 30 | Fill #5

## 2016-04-26 ENCOUNTER — Encounter: Payer: Self-pay | Admitting: Cardiology

## 2016-05-06 NOTE — Progress Notes (Signed)
Cardiology Office Note   Date:  05/07/2016   ID:  Kaitlyn Harper, DOB 1960-10-24, MRN [id number]  PCP:  Nance Pear., NP  Cardiologist:   Minus Breeding, MD  Referring:  Nance Pear., NP  Chief Complaint  Patient presents with  . Loss of Consciousness      History of Present Illness: Kaitlyn Harper is a 56 y.o. female who presents for hospital follow up.   She was recently in the hospital secondary to syncope.   She had an echo with no significant abnormalities and had a cardiac cath with normal coronary arteries.  I have not seen the patient since 2010.  She has had a cardiopulmonary stress test for evaluation of SOB.   Since discharge she has had no further events.  She is walking some days for exercise.  The patient denies any new symptoms such as chest discomfort, neck or arm discomfort. There has been no new shortness of breath, PND or orthopnea. There has been no reported presyncope or syncope.  She has rare heart flutters.    Past Medical History:  Diagnosis Date  . Anemia   . Anxiety   . Depression   . Diabetes mellitus without complication ([place])    type 2-diet controlled  . Hyperglycemia   . Hyperlipidemia   . Hypertension     Past Surgical History:  Procedure Laterality Date  . APPENDECTOMY  1985  . CHOLECYSTECTOMY  1985  . COLONOSCOPY  02-28-2011   4 polyps   . DILATION AND CURETTAGE OF UTERUS  2005  . [place]  2002  . KNEE SURGERY  1983   arthroscopy?  [name] LEFT HEART CATH AND CORONARY ANGIOGRAPHY N/A 03/19/2016   Procedure: Left Heart Cath and Coronary Angiography;  Surgeon: Peter M Martinique, MD;  Location: [facility];  Service: Cardiovascular;  Laterality: N/A;  . MOUTH SURGERY  03/08/2016  . POLYPECTOMY  2006   ?anal polyp?  [name] POLYPECTOMY  02-28-2011   4 polyps- 3 of them TA   . TEAR DUCT PROBING  2004  . TUBAL LIGATION  1985     Current Outpatient Prescriptions  Medication Sig Dispense Refill  . ARIPiprazole  (ABILIFY) 10 MG tablet   2  . aspirin EC 81 MG tablet Take 81 mg by mouth daily.    [name] atorvastatin (LIPITOR) 20 MG tablet TAKE 1 TABLET BY MOUTH DAILY 90 tablet 1  . BYSTOLIC 20 MG TABS TAKE 1 TABLET BY MOUTH DAILY **NEED OFFICE VISIT FOR FURTHER REFILLS** 90 tablet 0  . chlorhexidine (PERIDEX) 0.12 % solution   0  . lamoTRIgine (LAMICTAL) 100 MG tablet Take 100 mg by mouth in the morning and take 150 mg by mouth at night  0  . potassium chloride 20 MEQ/15ML (10%) SOLN Take 15 mLs (20 mEq total) by mouth daily. 900 mL 2  . traZODone (DESYREL) 50 MG tablet   2  . amLODipine-valsartan (EXFORGE) 10-160 MG tablet Take 1 tablet by mouth daily. 90 tablet 3   No current facility-administered medications for this visit.     Allergies:   Sulfonamide derivatives    ROS:  Please see the history of present illness.   Otherwise, review of systems are positive for none.   All other systems are reviewed and negative.    PHYSICAL EXAM: VS:  BP (!) 183/91   Pulse 72   Ht 5\' 4"  (1.626 m)   Wt 177 lb 12.8 oz (80.6 kg)  LMP 12/25/2008   BMI 30.52 kg/m  , BMI Body mass index is 30.52 kg/m. GENERAL:  Well appearing NECK:  No jugular venous distention, waveform within normal limits, carotid upstroke brisk and symmetric, no bruits, no thyromegaly LUNGS:  Clear to auscultation bilaterally BACK:  No CVA tenderness CHEST:  Unremarkable HEART:  PMI not displaced or sustained,S1 and S2 within normal limits, no S3, no S4, no clicks, no rubs, no murmurs ABD:  Flat, positive bowel sounds normal in frequency in pitch, no bruits, no rebound, no guarding, no midline pulsatile mass, no hepatomegaly, no splenomegaly EXT:  2 plus pulses throughout, no edema, no cyanosis no clubbing    EKG:  EKG is not ordered today. The ekg ordered 03/18/16 demonstrates Sinus rhythm, rate 80, axis within normal limits, first degree AV block, no acute ST-T wave changes.   Recent Labs: 03/18/2016: Magnesium 1.9 03/20/2016: BUN 12;  Creatinine, Ser 1.20; Hemoglobin 12.9; Platelets 308; Potassium 4.0; Sodium 136; TSH 1.411    Lipid Panel    Component Value Date/Time   CHOL 89 03/20/2016 0958   TRIG 90 03/20/2016 0958   HDL 24 (L) 03/20/2016 0958   CHOLHDL 3.7 03/20/2016 0958   VLDL 18 03/20/2016 0958   LDLCALC 47 03/20/2016 0958    Lab Results  Component Value Date   HGBA1C 5.7 (H) 03/20/2016      Wt Readings from Last 3 Encounters:  05/07/16 177 lb 12.8 oz (80.6 kg)  03/23/16 178 lb 3.2 oz (80.8 kg)  03/20/16 173 lb 11.2 oz (78.8 kg)      Other studies Reviewed: Additional studies/ records that were reviewed today include: Hospital records. Review of the above records demonstrates:  Please see elsewhere in the note.     ASSESSMENT AND PLAN:   SYNCOPE:    There was no evidence during her hospitalization of bradyarrhythmias.  She does have significant first degree heart block.  However, I don't think that extended monitoring will be forthcoming.  She has had no previous episodes or recurrent symptoms.  No further work up is indicated but she will call me if she has any syncope/presyncope.  HTN:  Her blood pressure is not controlled. I'm going to increase her Exforge  to 10 mg/160.    Current medicines are reviewed at length with the patient today.  The patient does not have concerns regarding medicines.  The following changes have been made:  no change  Labs/ tests ordered today include: None No orders of the defined types were placed in this encounter.    Disposition:   FU with me as needed.      Signed, Minus Breeding, MD  05/07/2016 8:52 AM    [facility]

## 2016-05-07 ENCOUNTER — Encounter: Payer: Self-pay | Admitting: Cardiology

## 2016-05-07 ENCOUNTER — Ambulatory Visit (INDEPENDENT_AMBULATORY_CARE_PROVIDER_SITE_OTHER): Payer: 59 | Admitting: Cardiology

## 2016-05-07 VITALS — BP 183/91 | HR 72 | Ht 64.0 in | Wt 177.8 lb

## 2016-05-07 DIAGNOSIS — R55 Syncope and collapse: Secondary | ICD-10-CM

## 2016-05-07 DIAGNOSIS — I10 Essential (primary) hypertension: Secondary | ICD-10-CM

## 2016-05-07 MED ORDER — AMLODIPINE BESYLATE-VALSARTAN 10-160 MG PO TABS: 1.0000 | ORAL_TABLET | Freq: Every day | ORAL | 3 refills | Status: DC

## 2016-05-07 NOTE — Patient Instructions (Signed)
Medication Instructions:  INCREASE- Exforge 10-160 mg daily  Labwork: None Ordered  Testing/Procedures: None Ordered  Follow-Up: Your physician recommends that you schedule a follow-up appointment in: As Needed   Any Other Special Instructions Will Be Listed Below (If Applicable).   If you need a refill on your cardiac medications before your next appointment, please call your pharmacy.

## 2016-05-14 MED FILL — BYSTOLIC 20 MG TABLET: 20 | 30 days supply | Qty: 30 | Fill #1

## 2016-05-14 MED FILL — ARIPiprazole 10 MG TABS: 10 | 30 days supply | Qty: 30 | Fill #1

## 2016-05-15 MED FILL — lamoTRIgine 100 MG TABS: 100 | 30 days supply | Qty: 30 | Fill #0

## 2016-05-15 MED FILL — lamoTRIgine 150 MG TABS: 150 | 30 days supply | Qty: 30 | Fill #0

## 2016-05-16 DIAGNOSIS — F3181 Bipolar II disorder: Secondary | ICD-10-CM | POA: Diagnosis not present

## 2016-05-30 MED FILL — AMLODIPINE-VALSARTAN 10-160: 10-160 | 90 days supply | Qty: 90 | Fill #0

## 2016-06-11 DIAGNOSIS — Z6830 Body mass index (BMI) 30.0-30.9, adult: Secondary | ICD-10-CM | POA: Diagnosis not present

## 2016-06-11 DIAGNOSIS — G4700 Insomnia, unspecified: Secondary | ICD-10-CM | POA: Insufficient documentation

## 2016-06-11 DIAGNOSIS — Z1231 Encounter for screening mammogram for malignant neoplasm of breast: Secondary | ICD-10-CM | POA: Diagnosis not present

## 2016-06-11 DIAGNOSIS — N951 Menopausal and female climacteric states: Secondary | ICD-10-CM | POA: Diagnosis not present

## 2016-06-11 DIAGNOSIS — B354 Tinea corporis: Secondary | ICD-10-CM | POA: Diagnosis not present

## 2016-06-11 DIAGNOSIS — Z01411 Encounter for gynecological examination (general) (routine) with abnormal findings: Secondary | ICD-10-CM | POA: Diagnosis not present

## 2016-06-11 DIAGNOSIS — L989 Disorder of the skin and subcutaneous tissue, unspecified: Secondary | ICD-10-CM | POA: Insufficient documentation

## 2016-06-11 DIAGNOSIS — D259 Leiomyoma of uterus, unspecified: Secondary | ICD-10-CM | POA: Diagnosis not present

## 2016-06-11 MED FILL — BYSTOLIC 20 MG TABLET: 20 | 30 days supply | Qty: 30 | Fill #2

## 2016-06-11 MED FILL — NYSTATIN 100,000 UNITS/GM O: 100000 | 15 days supply | Qty: 30 | Fill #0

## 2016-06-11 MED FILL — ESTRADIOL 0.05 MG PATCH: 0.05 | 28 days supply | Qty: 8 | Fill #0

## 2016-06-11 MED FILL — PROGESTERONE 100 MG CAPSULE: 100 | 30 days supply | Qty: 30 | Fill #0

## 2016-06-11 MED FILL — TRIAMCINOLONE 0.1% OINTMENT: 0.1 | 8 days supply | Qty: 15 | Fill #0

## 2016-06-12 MED FILL — lamoTRIgine 100 MG TABS: 100 | 30 days supply | Qty: 30 | Fill #0

## 2016-06-12 MED FILL — ARIPiprazole 10 MG TABS: 10 | 30 days supply | Qty: 30 | Fill #0

## 2016-06-18 ENCOUNTER — Ambulatory Visit (INDEPENDENT_AMBULATORY_CARE_PROVIDER_SITE_OTHER): Payer: 59 | Admitting: Family

## 2016-06-18 ENCOUNTER — Encounter: Payer: Self-pay | Admitting: Family

## 2016-06-18 DIAGNOSIS — F419 Anxiety disorder, unspecified: Secondary | ICD-10-CM | POA: Diagnosis not present

## 2016-06-18 DIAGNOSIS — I10 Essential (primary) hypertension: Secondary | ICD-10-CM | POA: Diagnosis not present

## 2016-06-18 DIAGNOSIS — E119 Type 2 diabetes mellitus without complications: Secondary | ICD-10-CM

## 2016-06-18 DIAGNOSIS — F32A Depression, unspecified: Secondary | ICD-10-CM

## 2016-06-18 DIAGNOSIS — F329 Major depressive disorder, single episode, unspecified: Secondary | ICD-10-CM

## 2016-06-18 MED ORDER — AMLODIPINE BESYLATE-VALSARTAN 10-320 MG PO TABS: 1.0000 | ORAL_TABLET | Freq: Every day | ORAL | 2 refills | Status: DC

## 2016-06-18 NOTE — Assessment & Plan Note (Signed)
Stable, management per psychiatry.

## 2016-06-18 NOTE — Assessment & Plan Note (Signed)
Uncontrolled. Increase exforge dose. Follow up in 1 month. Plan to check bmet at her follow up appointment in 1 month.

## 2016-06-18 NOTE — Progress Notes (Signed)
Pre visit review using our clinic review tool, if applicable. No additional management support is needed unless otherwise documented below in the visit note. 

## 2016-06-18 NOTE — Progress Notes (Signed)
Subjective:    Patient ID: Kaitlyn Harper, female    DOB: 03/10/1960, 56 y.o.   MRN: [id number]  HPI  Kaitlyn Harper is a 56 yr old female who presents today for follow up.  1) anxiety/depression-following with psychiatry. Reports that this is well controlled.   2) HTN- maintained on exforge/and bystolic. Exforge dose was increased by Dr. Percival Spanish back in March.   BP Readings from Last 3 Encounters:  06/18/16 (!) 157/78  05/07/16 (!) 183/91  03/23/16 (!) 144/77   3) DM2-  Lab Results  Component Value Date   HGBA1C 5.7 (H) 03/20/2016   HGBA1C 5.8 08/02/2015   HGBA1C 5.5 12/27/2014   Lab Results  Component Value Date   MICROALBUR 1.8 08/02/2015   LDLCALC 47 03/20/2016   CREATININE 1.20 (H) 03/20/2016     Review of Systems  Past Medical History:  Diagnosis Date  . Anemia   . Anxiety   . Depression   . Diabetes mellitus without complication ([place])    type 2-diet controlled  . Hyperglycemia   . Hyperlipidemia   . Hypertension      Social History   Social History  . Marital status: Divorced    Spouse name: N/A  . Number of children: 3  . Years of education: N/A   Occupational History  . RN [employer]   Social History Main Topics  . Smoking status: Former Smoker    Packs/day: 0.25    Types: Cigarettes    Quit date: 07/02/2015  . Smokeless tobacco: Never Used     Comment: 10 cigarettes daily  . Alcohol use 0.0 oz/week     Comment: socially  . Drug use: No  . Sexual activity: Yes    Birth control/ protection: Post-menopausal   Other Topics Concern  . Not on file   Social History Narrative   Caffeine use:  2 drinks   Regular exercise: no          Past Surgical History:  Procedure Laterality Date  . APPENDECTOMY  1985  . CHOLECYSTECTOMY  1985  . COLONOSCOPY  02-28-2011   4 polyps   . DILATION AND CURETTAGE OF UTERUS  2005  . [place]  2002  . KNEE SURGERY  1983   arthroscopy?  [name] LEFT HEART CATH AND CORONARY  ANGIOGRAPHY N/A 03/19/2016   Procedure: Left Heart Cath and Coronary Angiography;  Surgeon: Peter M Martinique, MD;  Location: [facility];  Service: Cardiovascular;  Laterality: N/A;  . MOUTH SURGERY  03/08/2016  . POLYPECTOMY  2006   ?anal polyp?  [name] POLYPECTOMY  02-28-2011   4 polyps- 3 of them TA   . TEAR DUCT PROBING  2004  . TUBAL LIGATION  1985    Family History  Problem Relation Age of Onset  . Hypertension Father   . Sarcoidosis Brother   . Alcohol abuse Brother   . Cirrhosis Brother   . Stroke Maternal Grandmother   . Colon cancer Neg Hx   . Colon polyps Neg Hx   . Esophageal cancer Neg Hx   . Rectal cancer Neg Hx   . Stomach cancer Neg Hx   . Pulmonary embolism Neg Hx     Allergies  Allergen Reactions  . Sulfonamide Derivatives Hives and Other (See Comments)    Light sensitivity    Current Outpatient Prescriptions on File Prior to Visit  Medication Sig Dispense Refill  . amLODipine-valsartan (EXFORGE) 10-160 MG tablet Take 1 tablet  by mouth daily. 90 tablet 3  . ARIPiprazole (ABILIFY) 10 MG tablet   2  . aspirin EC 81 MG tablet Take 81 mg by mouth daily.    [name] atorvastatin (LIPITOR) 20 MG tablet TAKE 1 TABLET BY MOUTH DAILY 90 tablet 1  . BYSTOLIC 20 MG TABS TAKE 1 TABLET BY MOUTH DAILY **NEED OFFICE VISIT FOR FURTHER REFILLS** 90 tablet 0  . chlorhexidine (PERIDEX) 0.12 % solution   0  . lamoTRIgine (LAMICTAL) 100 MG tablet Take 100 mg by mouth in the morning.  0  . potassium chloride 20 MEQ/15ML (10%) SOLN Take 15 mLs (20 mEq total) by mouth daily. 900 mL 2  . traZODone (DESYREL) 50 MG tablet   2   No current facility-administered medications on file prior to visit.     BP (!) 157/78 (BP Location: Right Arm, Cuff Size: Normal)   Pulse 67   Temp 98.4 F (36.9 C) (Oral)   Resp 16   Ht 5\' 4"  (1.626 m)   Wt 174 lb 6.4 oz (79.1 kg)   LMP 12/25/2008   SpO2 100% Comment: room air  BMI 29.94 kg/m        Objective:   Physical Exam  Constitutional: She  is oriented to person, place, and time. She appears well-developed and well-nourished.  HENT:  Head: Normocephalic and atraumatic.  Cardiovascular: Normal rate, regular rhythm and normal heart sounds.   No murmur heard. Pulmonary/Chest: Effort normal and breath sounds normal. No respiratory distress. She has no wheezes.  Neurological: She is alert and oriented to person, place, and time.  Skin: Skin is warm and dry.  Psychiatric: She has a normal mood and affect. Her behavior is normal. Judgment and thought content normal.          Assessment & Plan:

## 2016-06-18 NOTE — Assessment & Plan Note (Signed)
Diet controlled. °Monitor. °

## 2016-06-18 NOTE — Patient Instructions (Signed)
Please increase your Exforge to 10/320mg .

## 2016-06-26 MED FILL — TRIAMCINOLONE 0.1% OINTMENT: 0.1 | 8 days supply | Qty: 15 | Fill #1

## 2016-06-26 MED FILL — NAPROXEN SODIUM 550 MG TAB: 550 | 6 days supply | Qty: 12 | Fill #0

## 2016-06-26 MED FILL — CHLORHEXIDINE 0.12% RINSE: 0.12 | 15 days supply | Qty: 473 | Fill #0

## 2016-06-26 MED FILL — lamoTRIgine 150 MG TABS: 150 | 30 days supply | Qty: 30 | Fill #0

## 2016-07-02 MED FILL — ATORVASTATIN 20 MG TABLET: 20 | 90 days supply | Qty: 90 | Fill #1

## 2016-07-10 ENCOUNTER — Other Ambulatory Visit: Payer: Self-pay | Admitting: Family

## 2016-07-10 MED FILL — ESTRADIOL 0.05 MG PATCH: 0.05 | 28 days supply | Qty: 8 | Fill #1

## 2016-07-10 MED FILL — ARIPiprazole 10 MG TABS: 10 | 30 days supply | Qty: 30 | Fill #1

## 2016-07-10 MED FILL — traZODone HCL 50 MG TABS: 50 | 30 days supply | Qty: 60 | Fill #0

## 2016-07-10 MED FILL — PROGESTERONE 100 MG CAPSULE: 100 | 30 days supply | Qty: 30 | Fill #0

## 2016-07-10 MED FILL — BYSTOLIC 20 MG TABLET: 20 | 30 days supply | Qty: 30 | Fill #0

## 2016-07-10 MED FILL — lamoTRIgine 100 MG TABS: 100 | 30 days supply | Qty: 30 | Fill #1

## 2016-07-10 NOTE — Telephone Encounter (Signed)
Refill sent per [facility] refill protocol/SLS  

## 2016-07-16 ENCOUNTER — Encounter: Payer: Self-pay | Admitting: Family

## 2016-07-16 ENCOUNTER — Ambulatory Visit (INDEPENDENT_AMBULATORY_CARE_PROVIDER_SITE_OTHER): Payer: 59 | Admitting: Family

## 2016-07-16 DIAGNOSIS — I10 Essential (primary) hypertension: Secondary | ICD-10-CM | POA: Diagnosis not present

## 2016-07-16 NOTE — Assessment & Plan Note (Signed)
BP looks better today  But still above goal. I advised her to call her pharmacy as they should be able to fill the rx since the dose was changed. Advised her to start the higher dose and follow up 2 weeks after starting.

## 2016-07-16 NOTE — Progress Notes (Signed)
Subjective:    Patient ID: Kaitlyn Harper, female    DOB: March 02, 1960, 56 y.o.   MRN: [id number]  HPI  Kaitlyn Harper is a 56 yr old female who presents today for follow up.  HTN- last visit she was advised to increase her exforge from 5-160 to 10/320. She states that she did not start the new dose because the previous dose was written for 90 days.   BP Readings from Last 3 Encounters:  07/16/16 (!) 143/75  06/18/16 (!) 157/78  05/07/16 (!) 183/91    Review of Systems See HPI  Past Medical History:  Diagnosis Date  . Anemia   . Anxiety   . Depression   . Diabetes mellitus without complication ([place])    type 2-diet controlled  . Hyperglycemia   . Hyperlipidemia   . Hypertension      Social History   Social History  . Marital status: Divorced    Spouse name: N/A  . Number of children: 3  . Years of education: N/A   Occupational History  . RN [employer]   Social History Main Topics  . Smoking status: Former Smoker    Packs/day: 0.25    Types: Cigarettes    Quit date: 07/02/2015  . Smokeless tobacco: Never Used     Comment: 10 cigarettes daily  . Alcohol use 0.0 oz/week     Comment: socially  . Drug use: No  . Sexual activity: Yes    Birth control/ protection: Post-menopausal   Other Topics Concern  . Not on file   Social History Narrative   Caffeine use:  2 drinks   Regular exercise: no          Past Surgical History:  Procedure Laterality Date  . APPENDECTOMY  1985  . CHOLECYSTECTOMY  1985  . COLONOSCOPY  02-28-2011   4 polyps   . DILATION AND CURETTAGE OF UTERUS  2005  . [place]  2002  . KNEE SURGERY  1983   arthroscopy?  [name] LEFT HEART CATH AND CORONARY ANGIOGRAPHY N/A 03/19/2016   Procedure: Left Heart Cath and Coronary Angiography;  Surgeon: Peter M Martinique, MD;  Location: [facility];  Service: Cardiovascular;  Laterality: N/A;  . MOUTH SURGERY  03/08/2016  . POLYPECTOMY  2006   ?anal polyp?  [name] POLYPECTOMY   02-28-2011   4 polyps- 3 of them TA   . TEAR DUCT PROBING  2004  . TUBAL LIGATION  1985    Family History  Problem Relation Age of Onset  . Hypertension Father   . Sarcoidosis Brother   . Alcohol abuse Brother   . Cirrhosis Brother   . Stroke Maternal Grandmother   . Colon cancer Neg Hx   . Colon polyps Neg Hx   . Esophageal cancer Neg Hx   . Rectal cancer Neg Hx   . Stomach cancer Neg Hx   . Pulmonary embolism Neg Hx     Allergies  Allergen Reactions  . Sulfonamide Derivatives Hives and Other (See Comments)    Light sensitivity    Current Outpatient Prescriptions on File Prior to Visit  Medication Sig Dispense Refill  . ARIPiprazole (ABILIFY) 10 MG tablet   2  . aspirin EC 81 MG tablet Take 81 mg by mouth daily.    [name] atorvastatin (LIPITOR) 20 MG tablet TAKE 1 TABLET BY MOUTH DAILY 90 tablet 1  . chlorhexidine (PERIDEX) 0.12 % solution   0  . estradiol (VIVELLE-DOT)  0.05 MG/24HR patch Place 1 patch onto the skin 2 (two) times a week.    . lamoTRIgine (LAMICTAL) 100 MG tablet Take 100 mg by mouth in the morning.  0  . lamoTRIgine (LAMICTAL) 150 MG tablet Take 1 tablet by mouth at bedtime.  0  . Nebivolol HCl (BYSTOLIC) 20 MG TABS Take 1 tablet (20 mg total) by mouth daily. 90 tablet 0  . nystatin ointment (MYCOSTATIN) Apply 1 application topically 2 (two) times daily.    . potassium chloride 20 MEQ/15ML (10%) SOLN Take 15 mLs (20 mEq total) by mouth daily. 900 mL 2  . progesterone (PROMETRIUM) 100 MG capsule Take 100 mg by mouth at bedtime.    . traZODone (DESYREL) 50 MG tablet   2  . triamcinolone ointment (KENALOG) 0.1 % Apply 1 application topically 2 (two) times daily.    [name] amLODipine-valsartan (EXFORGE) 10-320 MG tablet Take 1 tablet by mouth daily. (Patient not taking: Reported on 07/16/2016) 30 tablet 2   No current facility-administered medications on file prior to visit.     BP (!) 143/75 (BP Location: Right Arm, Cuff Size: Normal)   Pulse 65   Temp 98.8 F (37.1  C) (Oral)   Resp 16   Ht 5\' 4"  (1.626 m)   Wt 175 lb (79.4 kg)   LMP 12/25/2008   SpO2 100%   BMI 30.04 kg/m       Objective:   Physical Exam  Constitutional: She is oriented to person, place, and time. She appears well-developed and well-nourished.  HENT:  Head: Normocephalic and atraumatic.  Cardiovascular: Normal rate, regular rhythm and normal heart sounds.   No murmur heard. Pulmonary/Chest: Effort normal and breath sounds normal. No respiratory distress. She has no wheezes.  Musculoskeletal: She exhibits no edema.  Neurological: She is alert and oriented to person, place, and time.  Psychiatric: She has a normal mood and affect. Her behavior is normal. Judgment and thought content normal.          Assessment & Plan:

## 2016-07-16 NOTE — Patient Instructions (Signed)
Please contact your pharmacy re: filling exforge 10/320mg .   Follow up in the office 2 weeks after you start increased dose so we can recheck blood work and blood pressure.

## 2016-07-25 DIAGNOSIS — G4700 Insomnia, unspecified: Secondary | ICD-10-CM | POA: Diagnosis not present

## 2016-07-25 DIAGNOSIS — L989 Disorder of the skin and subcutaneous tissue, unspecified: Secondary | ICD-10-CM | POA: Diagnosis not present

## 2016-07-25 DIAGNOSIS — N951 Menopausal and female climacteric states: Secondary | ICD-10-CM | POA: Diagnosis not present

## 2016-07-30 MED FILL — lamoTRIgine 150 MG TABS: 150 | 30 days supply | Qty: 30 | Fill #1

## 2016-08-02 MED FILL — NYSTATIN 100,000 UNITS/GM O: 100000 | 7 days supply | Qty: 15 | Fill #0

## 2016-08-03 MED FILL — ARIPiprazole 10 MG TABS: 10 | 30 days supply | Qty: 30 | Fill #2

## 2016-08-03 MED FILL — PROGESTERONE 100 MG CAPSULE: 100 | 30 days supply | Qty: 30 | Fill #1

## 2016-08-03 MED FILL — ESTRADIOL 0.05 MG PATCH: 0.05 | 28 days supply | Qty: 8 | Fill #2

## 2016-08-03 MED FILL — BYSTOLIC 20 MG TABLET: 20 | 30 days supply | Qty: 30 | Fill #1

## 2016-08-21 ENCOUNTER — Encounter: Payer: Self-pay | Admitting: Family

## 2016-09-05 MED FILL — AMLODIPINE-VALSARTAN 10-160: 10-160 | 90 days supply | Qty: 90 | Fill #1

## 2016-09-05 MED FILL — lamoTRIgine 100 MG TABS: 100 | 30 days supply | Qty: 30 | Fill #0

## 2016-09-18 MED FILL — BYSTOLIC 20 MG TABLET: 20 | 30 days supply | Qty: 30 | Fill #2

## 2016-09-20 DIAGNOSIS — F3181 Bipolar II disorder: Secondary | ICD-10-CM | POA: Diagnosis not present

## 2016-09-20 MED FILL — ARIPiprazole 10 MG TABS: 10 | 30 days supply | Qty: 30 | Fill #0

## 2016-09-20 MED FILL — lamoTRIgine 150 MG TABS: 150 | 30 days supply | Qty: 30 | Fill #0

## 2016-09-24 ENCOUNTER — Telehealth: Payer: Self-pay | Admitting: Medical

## 2016-09-24 ENCOUNTER — Encounter: Payer: Self-pay | Admitting: Medical

## 2016-09-24 ENCOUNTER — Ambulatory Visit (INDEPENDENT_AMBULATORY_CARE_PROVIDER_SITE_OTHER): Payer: 59 | Admitting: Medical

## 2016-09-24 VITALS — BP 139/76 | HR 63 | Temp 98.0°F | Resp 16 | Ht 64.0 in | Wt 162.6 lb

## 2016-09-24 DIAGNOSIS — N390 Urinary tract infection, site not specified: Secondary | ICD-10-CM

## 2016-09-24 DIAGNOSIS — R319 Hematuria, unspecified: Secondary | ICD-10-CM

## 2016-09-24 LAB — POC URINALSYSI DIPSTICK (AUTOMATED)
Bilirubin, UA: NEGATIVE
GLUCOSE UA: NEGATIVE
Ketones, UA: NEGATIVE
NITRITE UA: POSITIVE
PH UA: 6 (ref 5.0–8.0)
UROBILINOGEN UA: NEGATIVE U/dL — AB

## 2016-09-24 MED ORDER — PHENAZOPYRIDINE HCL 200 MG PO TABS: 200.0000 mg | ORAL_TABLET | Freq: Three times a day (TID) | ORAL | 0 refills | Status: DC | PRN

## 2016-09-24 MED ORDER — NITROFURANTOIN MONOHYD MACRO 100 MG PO CAPS
100.0000 mg | ORAL_CAPSULE | Freq: Two times a day (BID) | ORAL | 0 refills | Status: DC
Start: 2016-09-24 — End: 2016-12-17

## 2016-09-24 MED FILL — NITROFURANTOIN MONO-MCR 100: 100 | 7 days supply | Qty: 14 | Fill #0

## 2016-09-24 MED FILL — PHENAZOPYRIDINE 200 MG TAB: 200 | 2 days supply | Qty: 6 | Fill #0

## 2016-09-24 NOTE — Progress Notes (Signed)
Subjective:    Patient ID: Kaitlyn Harper, female    DOB: 14-Nov-1960, 56 y.o.   MRN: [id number]  HPI  Pt in states cloudy urine and odor to urine. States for about 4 days. Bladder feels full and pain end of urination.   Pt in today reporting urinary symptoms.  Dysuria- yes Frequent urination-yes Hesitancy-yes Suprapubic pressure-yes Fever-no chills-no Nauseano- Vomiting-no CVA pain-no History of UTI-yes. Rare. Gross hematuria-no   Review of Systems  Constitutional: Negative for chills, fatigue and fever.  Respiratory: Negative for cough, chest tightness, shortness of breath and wheezing.   Cardiovascular: Negative for chest pain and palpitations.  Gastrointestinal: Negative for abdominal pain.  Genitourinary: Positive for dysuria, frequency and urgency. Negative for decreased urine volume and vaginal discharge.  Musculoskeletal: Negative for back pain.  Skin: Negative for rash.  Neurological: Negative for dizziness, weakness, numbness and headaches.  Hematological: Negative for adenopathy. Does not bruise/bleed easily.  Psychiatric/Behavioral: Negative for behavioral problems and confusion.    Past Medical History:  Diagnosis Date  . Anemia   . Anxiety   . Depression   . Diabetes mellitus without complication ([place])    type 2-diet controlled  . Hyperglycemia   . Hyperlipidemia   . Hypertension      Social History   Social History  . Marital status: Divorced    Spouse name: N/A  . Number of children: 3  . Years of education: N/A   Occupational History  . RN [employer]   Social History Main Topics  . Smoking status: Former Smoker    Packs/day: 0.25    Types: Cigarettes    Quit date: 07/02/2015  . Smokeless tobacco: Never Used     Comment: 10 cigarettes daily  . Alcohol use 0.0 oz/week     Comment: socially  . Drug use: No  . Sexual activity: Yes    Birth control/ protection: Post-menopausal   Other Topics Concern  . Not on  file   Social History Narrative   Caffeine use:  2 drinks   Regular exercise: no          Past Surgical History:  Procedure Laterality Date  . APPENDECTOMY  1985  . CHOLECYSTECTOMY  1985  . COLONOSCOPY  02-28-2011   4 polyps   . DILATION AND CURETTAGE OF UTERUS  2005  . [place]  2002  . KNEE SURGERY  1983   arthroscopy?  [name] LEFT HEART CATH AND CORONARY ANGIOGRAPHY N/A 03/19/2016   Procedure: Left Heart Cath and Coronary Angiography;  Surgeon: Peter M Martinique, MD;  Location: [facility];  Service: Cardiovascular;  Laterality: N/A;  . MOUTH SURGERY  03/08/2016  . POLYPECTOMY  2006   ?anal polyp?  [name] POLYPECTOMY  02-28-2011   4 polyps- 3 of them TA   . TEAR DUCT PROBING  2004  . TUBAL LIGATION  1985    Family History  Problem Relation Age of Onset  . Hypertension Father   . Sarcoidosis Brother   . Alcohol abuse Brother   . Cirrhosis Brother   . Stroke Maternal Grandmother   . Colon cancer Neg Hx   . Colon polyps Neg Hx   . Esophageal cancer Neg Hx   . Rectal cancer Neg Hx   . Stomach cancer Neg Hx   . Pulmonary embolism Neg Hx     Allergies  Allergen Reactions  . Sulfonamide Derivatives Hives and Other (See Comments)    Light sensitivity  Current Outpatient Prescriptions on File Prior to Visit  Medication Sig Dispense Refill  . amLODipine-valsartan (EXFORGE) 10-320 MG tablet Take 1 tablet by mouth daily. 30 tablet 2  . ARIPiprazole (ABILIFY) 10 MG tablet   2  . aspirin EC 81 MG tablet Take 81 mg by mouth daily.    [name] atorvastatin (LIPITOR) 20 MG tablet TAKE 1 TABLET BY MOUTH DAILY 90 tablet 1  . chlorhexidine (PERIDEX) 0.12 % solution   0  . estradiol (VIVELLE-DOT) 0.05 MG/24HR patch Place 1 patch onto the skin 2 (two) times a week.    . lamoTRIgine (LAMICTAL) 100 MG tablet Take 100 mg by mouth in the morning.  0  . lamoTRIgine (LAMICTAL) 150 MG tablet Take 1 tablet by mouth at bedtime.  0  . Nebivolol HCl (BYSTOLIC) 20 MG TABS Take 1 tablet (20  mg total) by mouth daily. 90 tablet 0  . nystatin ointment (MYCOSTATIN) Apply 1 application topically 2 (two) times daily.    . potassium chloride 20 MEQ/15ML (10%) SOLN Take 15 mLs (20 mEq total) by mouth daily. 900 mL 2  . progesterone (PROMETRIUM) 100 MG capsule Take 100 mg by mouth at bedtime.    . traZODone (DESYREL) 50 MG tablet   2  . triamcinolone ointment (KENALOG) 0.1 % Apply 1 application topically 2 (two) times daily.     No current facility-administered medications on file prior to visit.     BP 139/76   Pulse 63   Temp 98 F (36.7 C) (Oral)   Resp 16   Ht 5\' 4"  (1.626 m)   Wt 162 lb 9.6 oz (73.8 kg)   LMP 12/25/2008   SpO2 100%   BMI 27.91 kg/m       Objective:   Physical Exam  General Appearance- Not in acute distress.  HEENT Eyes- Scleraeral/Conjuntiva-bilat- Not Yellow. Mouth & Throat- Normal.  Chest and Lung Exam Auscultation: Breath sounds:-Normal. Adventitious sounds:- No Adventitious sounds.  Cardiovascular Auscultation:Rythm - Regular. Heart Sounds -Normal heart sounds.  Abdomen Inspection:-Inspection Normal.  Palpation/Perucssion: Palpation and Percussion of the abdomen reveal- suprapubic Tender mild, No Rebound tenderness, No rigidity(Guarding) and No Palpable abdominal masses.  Liver:-Normal.  Spleen:- Normal.   Back No cva pain      Assessment & Plan:  You appear to have a urinary tract infection. I am prescribing  macrobid antibiotic for the probable infection. Hydrate well. I am sending out a urine culture. During the interim if your signs and symptoms worsen rather than improving please notify us. We will notify your when the culture results are back.  Pyridium rx also written to help with pain on urination.  Follow up in 7 days or as needed.    Marino Rogerson, Percell Miller, PA-C

## 2016-09-24 NOTE — Telephone Encounter (Signed)
Future urine placed for blood in urine.

## 2016-09-24 NOTE — Patient Instructions (Addendum)
You appear to have a urinary tract infection. I am prescribing macrobid antibiotic for the probable infection. Hydrate well. I am sending out a urine culture. During the interim if your signs and symptoms worsen rather than improving please notify us. We will notify your when the culture results are back.  Follow up in 7 days or as needed. 

## 2016-09-27 ENCOUNTER — Telehealth: Payer: Self-pay | Admitting: Family

## 2016-09-27 LAB — URINE CULTURE

## 2016-09-27 NOTE — Telephone Encounter (Signed)
Pt returning call for lab results  

## 2016-10-03 ENCOUNTER — Encounter: Payer: Self-pay | Admitting: Medical

## 2016-10-03 ENCOUNTER — Telehealth: Payer: Self-pay | Admitting: Medical

## 2016-10-03 ENCOUNTER — Ambulatory Visit (INDEPENDENT_AMBULATORY_CARE_PROVIDER_SITE_OTHER): Payer: 59 | Admitting: Medical

## 2016-10-03 VITALS — BP 143/79 | HR 69 | Temp 98.2°F | Resp 16 | Ht 64.0 in | Wt 169.4 lb

## 2016-10-03 DIAGNOSIS — N390 Urinary tract infection, site not specified: Secondary | ICD-10-CM | POA: Diagnosis not present

## 2016-10-03 DIAGNOSIS — R319 Hematuria, unspecified: Secondary | ICD-10-CM

## 2016-10-03 DIAGNOSIS — R300 Dysuria: Secondary | ICD-10-CM | POA: Diagnosis not present

## 2016-10-03 LAB — POCT URINALYSIS DIPSTICK
BILIRUBIN UA: NEGATIVE
GLUCOSE UA: NEGATIVE
KETONES UA: NEGATIVE
Nitrite, UA: NEGATIVE
SPEC GRAV UA: 1.025 (ref 1.010–1.025)
UROBILINOGEN UA: 1 U/dL
pH, UA: 6 (ref 5.0–8.0)

## 2016-10-03 MED ORDER — CIPROFLOXACIN HCL 500 MG PO TABS: 500.0000 mg | ORAL_TABLET | Freq: Two times a day (BID) | ORAL | 0 refills | Status: DC

## 2016-10-03 MED FILL — CIPROFLOXACIN HCL 500 MG TA: 500 | 3 days supply | Qty: 6 | Fill #0

## 2016-10-03 NOTE — Progress Notes (Signed)
Subjective:    Patient ID: Kaitlyn Harper, female    DOB: 02/19/1960, 56 y.o.   MRN: [id number]  HPI  Pt in with some symptoms of UTI that came back today.(See last visit please)  Pt states that with macrobid for UTI confirmed by culture. On the second day she felt better. She finished antibiotic yesterday and then this am got recurrent symptoms. Had a lot of pressure over bladder and urge to urinate today. Also of burning again but just started today.  No fever, no chills or sweats.   Review of Systems  Constitutional: Negative for chills, fatigue and fever.  Respiratory: Negative for cough, chest tightness, shortness of breath and wheezing.   Cardiovascular: Negative for chest pain and palpitations.  Gastrointestinal: Negative for abdominal pain.  Genitourinary: Positive for dysuria and frequency. Negative for difficulty urinating, flank pain, hematuria, pelvic pain, urgency and vaginal pain.  Musculoskeletal: Negative for back pain.  Skin: Negative for rash.  Neurological: Negative for dizziness and headaches.  Hematological: Negative for adenopathy. Does not bruise/bleed easily.  Psychiatric/Behavioral: Negative for behavioral problems and confusion.    Past Medical History:  Diagnosis Date  . Anemia   . Anxiety   . Depression   . Diabetes mellitus without complication ([place])    type 2-diet controlled  . Hyperglycemia   . Hyperlipidemia   . Hypertension      Social History   Social History  . Marital status: Divorced    Spouse name: N/A  . Number of children: 3  . 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 Smoking status: Former Smoker    Packs/day: 0.25    Types: Cigarettes    Quit date: 07/02/2015  . Smokeless tobacco: Never Used     Comment: 10 cigarettes daily  . Alcohol use 0.0 oz/week     Comment: socially  . Drug use: No  . Sexual activity: Yes    Birth control/ protection:  Post-menopausal   Other Topics Concern  . Not on file   Social History Narrative   Caffeine use:  2 drinks   Regular exercise: no          Past Surgical History:  Procedure Laterality Date  . APPENDECTOMY  1985  . CHOLECYSTECTOMY  1985  . COLONOSCOPY  02-28-2011   4 polyps   . DILATION AND CURETTAGE OF UTERUS  2005  . [place]  2002  . KNEE SURGERY  1983   arthroscopy?  [name] LEFT HEART CATH AND CORONARY ANGIOGRAPHY N/A 03/19/2016   Procedure: Left Heart Cath and Coronary Angiography;  Surgeon: Peter M Martinique, MD;  Location: [facility];  Service: Cardiovascular;  Laterality: N/A;  . MOUTH SURGERY  03/08/2016  . POLYPECTOMY  2006   ?anal polyp?  [name] POLYPECTOMY  02-28-2011   4 polyps- 3 of them TA   . TEAR DUCT PROBING  2004  . TUBAL LIGATION  1985    Family History  Problem Relation Age of Onset  . Hypertension Father   . Sarcoidosis Brother   . Alcohol abuse Brother   . Cirrhosis Brother   . Stroke Maternal Grandmother   . Colon cancer Neg Hx   . Colon polyps Neg Hx   . Esophageal cancer Neg Hx   . Rectal cancer Neg Hx   . Stomach cancer Neg Hx   . Pulmonary embolism Neg Hx  Allergies  Allergen Reactions  . Sulfonamide Derivatives Hives and Other (See Comments)    Light sensitivity    Current Outpatient Prescriptions on File Prior to Visit  Medication Sig Dispense Refill  . amLODipine-valsartan (EXFORGE) 10-320 MG tablet Take 1 tablet by mouth daily. 30 tablet 2  . ARIPiprazole (ABILIFY) 10 MG tablet   2  . aspirin EC 81 MG tablet Take 81 mg by mouth daily.    [name] atorvastatin (LIPITOR) 20 MG tablet TAKE 1 TABLET BY MOUTH DAILY 90 tablet 1  . chlorhexidine (PERIDEX) 0.12 % solution   0  . estradiol (VIVELLE-DOT) 0.05 MG/24HR patch Place 1 patch onto the skin 2 (two) times a week.    . lamoTRIgine (LAMICTAL) 100 MG tablet Take 100 mg by mouth in the morning.  0  . lamoTRIgine (LAMICTAL) 150 MG tablet Take 1 tablet by mouth at bedtime.  0  .  Nebivolol HCl (BYSTOLIC) 20 MG TABS Take 1 tablet (20 mg total) by mouth daily. 90 tablet 0  . nitrofurantoin, macrocrystal-monohydrate, (MACROBID) 100 MG capsule Take 1 capsule (100 mg total) by mouth 2 (two) times daily. 14 capsule 0  . nystatin ointment (MYCOSTATIN) Apply 1 application topically 2 (two) times daily.    . phenazopyridine (PYRIDIUM) 200 MG tablet Take 1 tablet (200 mg total) by mouth 3 (three) times daily as needed for pain. 6 tablet 0  . potassium chloride 20 MEQ/15ML (10%) SOLN Take 15 mLs (20 mEq total) by mouth daily. 900 mL 2  . progesterone (PROMETRIUM) 100 MG capsule Take 100 mg by mouth at bedtime.    . traZODone (DESYREL) 50 MG tablet   2  . triamcinolone ointment (KENALOG) 0.1 % Apply 1 application topically 2 (two) times daily.     No current facility-administered medications on file prior to visit.     BP (!) 143/79   Pulse 69   Temp 98.2 F (36.8 C) (Oral)   Resp 16   Ht 5\' 4"  (1.626 m)   Wt 169 lb 6.4 oz (76.8 kg)   LMP 12/25/2008   SpO2 100%   BMI 29.08 kg/m       Objective:   Physical Exam  General  Mental Status - Alert. General Appearance - Well groomed. Not in acute distress.  Skin Rashes- No Rashes.   Chest and Lung Exam Auscultation: Breath Sounds:-Clear even and unlabored.  Cardiovascular Auscultation:Rythm- Regular, rate and rhythm. Murmurs & Other Heart Sounds:Ausculatation of the heart reveal- No Murmurs.  Lymphatic Head & Neck General Head & Neck Lymphatics: Bilateral: Description- No Localized lymphadenopathy.  Abdomen- soft, no-ntender, non-distended, +bs. Mild  mid suprapubic tenderness to palpation(reports with pressure he feels that she needs to urinate.).  Back- no cva tenderness.       Assessment & Plan:  You appear to have  Recurrent urinary tract infection. I am prescribing  cipro antibiotic for the probable infection. Hydrate well. I am sending out a urine culture. During the interim if your signs and  symptoms worsen rather than improving please notify us. We will notify your when the culture results are back.  As explained you might have residual bacteria from prior infection. Will follow culture. Do want to repeat urine culture in 7-10 days. Also to check and make sure no blood present.  Follow up in 7 days or as needed.  Tamber Burtch, Percell Miller, PA-C

## 2016-10-03 NOTE — Telephone Encounter (Signed)
Pt.notified

## 2016-10-03 NOTE — Telephone Encounter (Signed)
Open to place future urine. But the order was already placed.

## 2016-10-03 NOTE — Patient Instructions (Signed)
You appear to have  Recurrent urinary tract infection. I am prescribing  cipro antibiotic for the probable infection. Hydrate well. I am sending out a urine culture. During the interim if your signs and symptoms worsen rather than improving please notify us. We will notify your when the culture results are back.  As explained you might have residual bacteria from prior infection. Will follow culture. Do want to repeat urine culture in 7-10 days. Also to check and make sure no blood present.  Follow up in 7 days or as needed.

## 2016-10-04 ENCOUNTER — Other Ambulatory Visit: Payer: Self-pay | Admitting: Family

## 2016-10-04 LAB — URINE CULTURE

## 2016-10-04 MED FILL — lamoTRIgine 100 MG TABS: 100 | 30 days supply | Qty: 30 | Fill #0

## 2016-10-05 MED FILL — ATORVASTATIN 20 MG TABLET: 20 | 90 days supply | Qty: 90 | Fill #0

## 2016-10-09 ENCOUNTER — Other Ambulatory Visit: Payer: 59

## 2016-10-16 ENCOUNTER — Other Ambulatory Visit: Payer: Self-pay | Admitting: Family

## 2016-10-16 MED FILL — ARIPiprazole 10 MG TABS: 10 | 30 days supply | Qty: 30 | Fill #1

## 2016-10-16 MED FILL — BYSTOLIC 20 MG TABLET: 20 | 30 days supply | Qty: 30 | Fill #0

## 2016-10-16 MED FILL — traZODone HCL 50 MG TABS: 50 | 30 days supply | Qty: 60 | Fill #1

## 2016-10-18 ENCOUNTER — Ambulatory Visit (HOSPITAL_COMMUNITY): Payer: 59 | Admitting: Psychiatry

## 2016-11-06 MED FILL — lamoTRIgine 100 MG TABS: 100 | 30 days supply | Qty: 30 | Fill #1

## 2016-11-06 MED FILL — lamoTRIgine 150 MG TABS: 150 | 30 days supply | Qty: 30 | Fill #1

## 2016-11-15 MED FILL — ARIPiprazole 10 MG TABS: 10 | 30 days supply | Qty: 30 | Fill #0

## 2016-11-15 MED FILL — BYSTOLIC 20 MG TABLET: 20 | 30 days supply | Qty: 30 | Fill #1

## 2016-11-27 MED FILL — AMLODIPINE-VALSARTAN 10-320: 10-320 | 30 days supply | Qty: 30 | Fill #0

## 2016-12-10 MED FILL — lamoTRIgine 100 MG TABS: 100 | 30 days supply | Qty: 30 | Fill #2

## 2016-12-10 MED FILL — ARIPiprazole 10 MG TABS: 10 | 30 days supply | Qty: 30 | Fill #1

## 2016-12-10 MED FILL — BYSTOLIC 20 MG TABLET: 20 | 30 days supply | Qty: 30 | Fill #2

## 2016-12-17 ENCOUNTER — Ambulatory Visit (INDEPENDENT_AMBULATORY_CARE_PROVIDER_SITE_OTHER): Payer: 59 | Admitting: Family

## 2016-12-17 ENCOUNTER — Encounter: Payer: Self-pay | Admitting: Family

## 2016-12-17 VITALS — BP 140/80 | HR 65 | Temp 98.1°F | Resp 16 | Ht 64.0 in | Wt 162.2 lb

## 2016-12-17 DIAGNOSIS — F419 Anxiety disorder, unspecified: Secondary | ICD-10-CM | POA: Diagnosis not present

## 2016-12-17 DIAGNOSIS — E119 Type 2 diabetes mellitus without complications: Secondary | ICD-10-CM

## 2016-12-17 DIAGNOSIS — I10 Essential (primary) hypertension: Secondary | ICD-10-CM

## 2016-12-17 DIAGNOSIS — Z1231 Encounter for screening mammogram for malignant neoplasm of breast: Secondary | ICD-10-CM | POA: Diagnosis not present

## 2016-12-17 DIAGNOSIS — E785 Hyperlipidemia, unspecified: Secondary | ICD-10-CM | POA: Diagnosis not present

## 2016-12-17 DIAGNOSIS — Z1239 Encounter for other screening for malignant neoplasm of breast: Secondary | ICD-10-CM

## 2016-12-17 DIAGNOSIS — F329 Major depressive disorder, single episode, unspecified: Secondary | ICD-10-CM | POA: Diagnosis not present

## 2016-12-17 DIAGNOSIS — F32A Depression, unspecified: Secondary | ICD-10-CM

## 2016-12-17 MED ORDER — POTASSIUM CHLORIDE 20 MEQ/15ML (10%) PO SOLN
20.0000 meq | Freq: Every day | ORAL | 3 refills | Status: DC
Start: 2016-12-17 — End: 2018-10-20

## 2016-12-17 MED ORDER — ATORVASTATIN CALCIUM 20 MG PO TABS
20.0000 mg | ORAL_TABLET | Freq: Every day | ORAL | 1 refills | Status: DC
Start: 2016-12-17 — End: 2017-08-06

## 2016-12-17 MED ORDER — AMLODIPINE BESYLATE-VALSARTAN 10-320 MG PO TABS: 1.0000 | ORAL_TABLET | Freq: Every day | ORAL | 1 refills | Status: DC

## 2016-12-17 MED ORDER — NEBIVOLOL HCL 20 MG PO TABS
20.0000 mg | ORAL_TABLET | Freq: Every day | ORAL | 1 refills | Status: DC
Start: 2016-12-17 — End: 2017-04-19

## 2016-12-17 NOTE — Progress Notes (Signed)
Subjective:    Patient ID: Kaitlyn Harper, female    DOB: 1960/04/07, 56 y.o.   MRN: [id number]  HPI  [name] is a 56 yr old female who presents today for follow up.  DM2- diet controlled.  Lab Results  Component Value Date   HGBA1C 5.7 (H) 03/20/2016   HGBA1C 5.8 08/02/2015   HGBA1C 5.5 12/27/2014   Lab Results  Component Value Date   MICROALBUR 1.8 08/02/2015   LDLCALC 47 03/20/2016   CREATININE 1.20 (H) 03/20/2016   HTN- she continues exforge and bystolic.  BP Readings from Last 3 Encounters:  12/17/16 140/80  10/03/16 (!) 143/79  09/24/16 139/76   Depression- reports stable- followed by psychiatry.     Review of Systems See HPI  Past Medical History:  Diagnosis Date  . Anemia   . Anxiety   . Depression   . Diabetes mellitus without complication ([place])    type 2-diet controlled  . Hyperglycemia   . Hyperlipidemia   . Hypertension      Social History   Socioeconomic History  . Marital status: Divorced    Spouse name: Not on file  . Number of children: 3  . Years of education: Not on file  . Highest education level: Not on file  Social Needs  . Financial resource strain: Not on file  . Food insecurity - worry: Not on file  . Food insecurity - inability: Not on file  . Transportation needs - medical: Not on file  . Transportation needs - non-medical: Not on file  Occupational History  . Occupation: Water quality scientist: [employer]  Tobacco Use  . Smoking status: Former Smoker    Packs/day: 0.25    Types: Cigarettes    Last attempt to quit: 07/02/2015    Years since quitting: 1.4  . Smokeless tobacco: Never Used  . Tobacco comment: 10 cigarettes daily  Substance and Sexual Activity  . Alcohol use: Yes    Alcohol/week: 0.0 oz    Comment: socially  . Drug use: No  . Sexual activity: Yes    Birth control/protection: Post-menopausal  Other Topics Concern  . Not on file  Social History Narrative   Caffeine use:  2 drinks   Regular exercise: no       Past Surgical History:  Procedure Laterality Date  . APPENDECTOMY  1985  . CHOLECYSTECTOMY  1985  . COLONOSCOPY  02-28-2011   4 polyps   . DILATION AND CURETTAGE OF UTERUS  2005  . [place]  2002  . KNEE SURGERY  1983   arthroscopy?  [name] MOUTH SURGERY  03/08/2016  . POLYPECTOMY  2006   ?anal polyp?  [name] POLYPECTOMY  02-28-2011   4 polyps- 3 of them TA   . TEAR DUCT PROBING  2004  . TUBAL LIGATION  1985    Family History  Problem Relation Age of Onset  . Hypertension Father   . Sarcoidosis Brother   . Alcohol abuse Brother   . Cirrhosis Brother   . Stroke Maternal Grandmother   . Colon cancer Neg Hx   . Colon polyps Neg Hx   . Esophageal cancer Neg Hx   . Rectal cancer Neg Hx   . Stomach cancer Neg Hx   . Pulmonary embolism Neg Hx     Allergies  Allergen Reactions  . Sulfonamide Derivatives Hives and Other (See Comments)    Light sensitivity    Current Outpatient Medications on  File Prior to Visit  Medication Sig Dispense Refill  . amLODipine-valsartan (EXFORGE) 10-320 MG tablet Take 1 tablet by mouth daily. 30 tablet 2  . ARIPiprazole (ABILIFY) 10 MG tablet   2  . aspirin EC 81 MG tablet Take 81 mg by mouth daily.    [name] atorvastatin (LIPITOR) 20 MG tablet TAKE 1 TABLET BY MOUTH DAILY 90 tablet 0  . BYSTOLIC 20 MG TABS TAKE 1 TABLET (20 MG TOTAL) BY MOUTH DAILY. 90 tablet 1  . lamoTRIgine (LAMICTAL) 100 MG tablet Take 100 mg by mouth in the morning.  0  . lamoTRIgine (LAMICTAL) 150 MG tablet Take 1 tablet by mouth at bedtime.  0  . nystatin ointment (MYCOSTATIN) Apply 1 application topically 2 (two) times daily.    . potassium chloride 20 MEQ/15ML (10%) SOLN Take 15 mLs (20 mEq total) by mouth daily. 900 mL 2  . traZODone (DESYREL) 50 MG tablet   2  . triamcinolone ointment (KENALOG) 0.1 % Apply 1 application topically 2 (two) times daily.     No current facility-administered medications on file prior to visit.     BP 140/80  (BP Location: Right Arm, Cuff Size: Normal)   Pulse 65   Temp 98.1 F (36.7 C) (Oral)   Resp 16   Ht 5\' 4"  (1.626 m)   Wt 162 lb 3.2 oz (73.6 kg)   LMP 12/25/2008   SpO2 97%   BMI 27.84 kg/m       Objective:   Physical Exam  Constitutional: She is oriented to person, place, and time. She appears well-developed and well-nourished.  Cardiovascular: Normal rate, regular rhythm and normal heart sounds.  No murmur heard. Pulmonary/Chest: Effort normal and breath sounds normal. No respiratory distress. She has no wheezes.  Musculoskeletal: She exhibits no edema.  Neurological: She is alert and oriented to person, place, and time.  Psychiatric: She has a normal mood and affect. Her behavior is normal. Judgment and thought content normal.          Assessment & Plan:

## 2016-12-17 NOTE — Assessment & Plan Note (Signed)
BP is fair. Continue lisinopril and bystolic.

## 2016-12-17 NOTE — Patient Instructions (Signed)
Please complete lab work prior to leaving.   

## 2016-12-17 NOTE — Assessment & Plan Note (Signed)
Stable- diet controlled.  Will check follow up A1c.

## 2016-12-17 NOTE — Assessment & Plan Note (Signed)
Obtain follow up lipid panel. Continue lipitor.

## 2016-12-17 NOTE — Assessment & Plan Note (Signed)
Stable, management per psych 

## 2016-12-18 LAB — COMPREHENSIVE METABOLIC PANEL
ALBUMIN: 4.3 g/dL (ref 3.5–5.2)
ALT: 12 U/L (ref 0–35)
AST: 17 U/L (ref 0–37)
Alkaline Phosphatase: 97 U/L (ref 39–117)
BUN: 8 mg/dL (ref 6–23)
CO2: 29 meq/L (ref 19–32)
CREATININE: 1.03 mg/dL (ref 0.40–1.20)
Calcium: 9.7 mg/dL (ref 8.4–10.5)
Chloride: 106 mEq/L (ref 96–112)
GFR: 70.63 mL/min (ref >60.00)
Glucose, Bld: 95 mg/dL (ref 70–99)
Potassium: 3.5 mEq/L (ref 3.5–5.1)
SODIUM: 141 meq/L (ref 135–145)
Total Bilirubin: 0.4 mg/dL (ref 0.2–1.2)
Total Protein: 7.8 g/dL (ref 6.0–8.3)

## 2016-12-18 LAB — HEMOGLOBIN A1C: Hgb A1c MFr Bld: 5.7 % (ref 4.6–6.5)

## 2016-12-28 MED FILL — AMLODIPINE-VALSARTAN 10-320: 10-320 | 30 days supply | Qty: 30 | Fill #1

## 2017-01-28 MED FILL — ARIPiprazole 10 MG TABS: 10 | 30 days supply | Qty: 30 | Fill #2

## 2017-01-28 MED FILL — BYSTOLIC 20 MG TABLET: 20 | 30 days supply | Qty: 30 | Fill #3

## 2017-01-28 MED FILL — ATORVASTATIN 20 MG TABLET: 20 | 90 days supply | Qty: 90 | Fill #0

## 2017-01-28 MED FILL — AMLODIPINE-VALSARTAN 10-320: 10-320 | 30 days supply | Qty: 30 | Fill #2

## 2017-01-28 MED FILL — lamoTRIgine 100 MG TABS: 100 | 30 days supply | Qty: 30 | Fill #3

## 2017-02-12 LAB — HM DIABETES EYE EXAM

## 2017-02-13 MED FILL — IBUPROFEN 600 MG TABLET: 600 | 4 days supply | Qty: 12 | Fill #0

## 2017-02-13 MED FILL — CHLORHEXIDINE 0.12% RINSE: 0.12 | 32 days supply | Qty: 473 | Fill #0

## 2017-02-25 MED FILL — lamoTRIgine 100 MG TABS: 100 | 30 days supply | Qty: 30 | Fill #4

## 2017-02-25 MED FILL — ARIPiprazole 10 MG TABS: 10 | 30 days supply | Qty: 30 | Fill #3

## 2017-02-25 MED FILL — BYSTOLIC 20 MG TABLET: 20 | 30 days supply | Qty: 30 | Fill #4

## 2017-03-14 ENCOUNTER — Ambulatory Visit: Payer: 59 | Admitting: Medical

## 2017-03-14 ENCOUNTER — Encounter: Payer: Self-pay | Admitting: Medical

## 2017-03-14 ENCOUNTER — Ambulatory Visit (HOSPITAL_BASED_OUTPATIENT_CLINIC_OR_DEPARTMENT_OTHER)
Admission: RE | Admit: 2017-03-14 | Discharge: 2017-03-14 | Disposition: A | Discharge status: Home / Self Care | Payer: 59 | Source: Ambulatory Visit | Attending: Medical | Admitting: Medical

## 2017-03-14 VITALS — BP 150/79 | HR 74 | Temp 98.9°F | Resp 16 | Ht 64.0 in | Wt 149.4 lb

## 2017-03-14 DIAGNOSIS — R05 Cough: Secondary | ICD-10-CM

## 2017-03-14 DIAGNOSIS — J40 Bronchitis, not specified as acute or chronic: Secondary | ICD-10-CM | POA: Diagnosis not present

## 2017-03-14 DIAGNOSIS — H6690 Otitis media, unspecified, unspecified ear: Secondary | ICD-10-CM | POA: Diagnosis not present

## 2017-03-14 DIAGNOSIS — R059 Cough, unspecified: Secondary | ICD-10-CM

## 2017-03-14 DIAGNOSIS — R062 Wheezing: Secondary | ICD-10-CM | POA: Insufficient documentation

## 2017-03-14 DIAGNOSIS — R0602 Shortness of breath: Secondary | ICD-10-CM | POA: Diagnosis not present

## 2017-03-14 LAB — POCT INFLUENZA A/B
INFLUENZA A, POC: NEGATIVE
Influenza B, POC: NEGATIVE

## 2017-03-14 MED ORDER — HYDROCODONE-HOMATROPINE 5-1.5 MG/5ML PO SYRP: 5.0000 mL | ORAL_SOLUTION | Freq: Four times a day (QID) | ORAL | 0 refills | Status: DC | PRN

## 2017-03-14 MED ORDER — AMOXICILLIN-POT CLAVULANATE 875-125 MG PO TABS: 1.0000 | ORAL_TABLET | Freq: Two times a day (BID) | ORAL | 0 refills | Status: DC

## 2017-03-14 MED ORDER — FLUTICASONE PROPIONATE 50 MCG/ACT NA SUSP: 2.0000 | Freq: Every day | NASAL | 1 refills | Status: DC

## 2017-03-14 MED ORDER — ALBUTEROL SULFATE HFA 108 (90 BASE) MCG/ACT IN AERS: 2.0000 | INHALATION_SPRAY | Freq: Four times a day (QID) | RESPIRATORY_TRACT | 2 refills | Status: DC | PRN

## 2017-03-14 MED ORDER — PREDNISONE 10 MG PO TABS: ORAL_TABLET | ORAL | 0 refills | Status: DC

## 2017-03-14 MED FILL — HYDROCODONE-HOMATROPINE SOL: 5-1.5 | 5 days supply | Qty: 100 | Fill #0

## 2017-03-14 MED FILL — predniSONE 10 MG TABS: 10 | 5 days supply | Qty: 15 | Fill #0

## 2017-03-14 MED FILL — AMLODIPINE-VALSARTAN 10-320: 10-320 | 90 days supply | Qty: 90 | Fill #0

## 2017-03-14 MED FILL — FLUTICASONE PROP 50 MCG SPR: 50 | 30 days supply | Qty: 16 | Fill #0

## 2017-03-14 MED FILL — VENTOLIN HFA 90 MCG INHALER: 108 (90 BAS | 25 days supply | Qty: 18 | Fill #0

## 2017-03-14 MED FILL — AMOX-CLAV 875-125 MG TABLET: 875-125 | 10 days supply | Qty: 20 | Fill #0

## 2017-03-14 NOTE — Patient Instructions (Addendum)
You appear to have bronchitis with some concern for pneumonia based on ausculation. Rest hydrate and tylenol for fever. I am prescribing cough medicine hycodan, and antibiotic(but want to review cxr first) For your nasal congestion flonase.  For wheezing albuterol. You may respond well but I do think you need to have prednisone available if wheezing persist/severe.  Cxr today. Will review xray and decide on antibiotic. You do have ear infection but if pneumonia found on xray would write stronger antibiotic.  Did review chest x-ray and it was clear.  No pneumonia reported.  So I am prescribing Augmentin.  Follow up in 7-10 days or as needed

## 2017-03-14 NOTE — Progress Notes (Signed)
Subjective:    Patient ID: Kaitlyn Harper, female    DOB: 05/13/1960, 57 y.o.   MRN: [id number]  HPI  One week  producutve cough, nasal congestion, chest congestion, ear pressure with some pain.  Pt has some wheezing on and off daily  for a week.  No fever or sweats. Occasional chills at night. No diffuse bodyaches.   Review of Systems  Constitutional: Positive for chills and fatigue. Negative for diaphoresis and fever.  HENT: Positive for congestion and ear pain. Negative for sinus pressure, sinus pain, sore throat and trouble swallowing.   Respiratory: Positive for cough and wheezing. Negative for chest tightness and shortness of breath.   Cardiovascular: Negative for chest pain and palpitations.  Gastrointestinal: Negative for abdominal pain.  Musculoskeletal: Negative for back pain and myalgias.  Skin: Negative for rash.  Neurological: Negative for dizziness, syncope, weakness and headaches.  Hematological: Negative for adenopathy. Does not bruise/bleed easily.  Psychiatric/Behavioral: Negative for behavioral problems, decreased concentration and sleep disturbance.   Past Medical History:  Diagnosis Date  . Anemia   . Anxiety   . Depression   . Diabetes mellitus without complication ([place])    type 2-diet controlled  . Hyperglycemia   . Hyperlipidemia   . Hypertension      Social History   Socioeconomic History  . Marital status: Divorced    Spouse name: Not on file  . Number of children: 3  . Years of education: Not on file  . Highest education level: Not on file  Social Needs  . Financial resource strain: Not on file  . Food insecurity - worry: Not on file  . Food insecurity - inability: Not on file  . Transportation needs - medical: Not on file  . Transportation needs - non-medical: Not on file  Occupational History  . Occupation: Water quality scientist: [employer]  Tobacco Use  . Smoking status: Former Smoker    Packs/day: 0.25    Types:  Cigarettes    Last attempt to quit: 07/02/2015    Years since quitting: 1.7  . Smokeless tobacco: Never Used  . Tobacco comment: 10 cigarettes daily  Substance and Sexual Activity  . Alcohol use: Yes    Alcohol/week: 0.0 oz    Comment: socially  . Drug use: No  . Sexual activity: Yes    Birth control/protection: Post-menopausal  Other Topics Concern  . Not on file  Social History Narrative   Caffeine use:  2 drinks   Regular exercise: no       Past Surgical History:  Procedure Laterality Date  . APPENDECTOMY  1985  . CHOLECYSTECTOMY  1985  . COLONOSCOPY  02-28-2011   4 polyps   . DILATION AND CURETTAGE OF UTERUS  2005  . [place]  2002  . KNEE SURGERY  1983   arthroscopy?  [name] LEFT HEART CATH AND CORONARY ANGIOGRAPHY N/A 03/19/2016   Procedure: Left Heart Cath and Coronary Angiography;  Surgeon: Peter M Martinique, MD;  Location: [facility];  Service: Cardiovascular;  Laterality: N/A;  . MOUTH SURGERY  03/08/2016  . POLYPECTOMY  2006   ?anal polyp?  [name] POLYPECTOMY  02-28-2011   4 polyps- 3 of them TA   . TEAR DUCT PROBING  2004  . TUBAL LIGATION  1985    Family History  Problem Relation Age of Onset  . Hypertension Father   . Sarcoidosis Brother   . Alcohol abuse Brother   .  Cirrhosis Brother   . Stroke Maternal Grandmother   . Colon cancer Neg Hx   . Colon polyps Neg Hx   . Esophageal cancer Neg Hx   . Rectal cancer Neg Hx   . Stomach cancer Neg Hx   . Pulmonary embolism Neg Hx     Allergies  Allergen Reactions  . Sulfonamide Derivatives Hives and Other (See Comments)    Light sensitivity    Current Outpatient Medications on File Prior to Visit  Medication Sig Dispense Refill  . amLODipine-valsartan (EXFORGE) 10-320 MG tablet Take 1 tablet daily by mouth. 90 tablet 1  . ARIPiprazole (ABILIFY) 10 MG tablet   2  . aspirin EC 81 MG tablet Take 81 mg by mouth daily.    [name] atorvastatin (LIPITOR) 20 MG tablet Take 1 tablet (20 mg total) daily by  mouth. 90 tablet 1  . lamoTRIgine (LAMICTAL) 100 MG tablet Take 100 mg by mouth in the morning.  0  . lamoTRIgine (LAMICTAL) 150 MG tablet Take 1 tablet by mouth at bedtime.  0  . Nebivolol HCl (BYSTOLIC) 20 MG TABS Take 1 tablet (20 mg total) daily by mouth. 90 tablet 1  . nystatin ointment (MYCOSTATIN) Apply 1 application topically 2 (two) times daily.    . potassium chloride 20 MEQ/15ML (10%) SOLN Take 15 mLs (20 mEq total) daily by mouth. 900 mL 3  . traZODone (DESYREL) 50 MG tablet   2  . triamcinolone ointment (KENALOG) 0.1 % Apply 1 application topically 2 (two) times daily.     No current facility-administered medications on file prior to visit.     BP (!) 150/79   Pulse 74   Temp 98.9 F (37.2 C) (Oral)   Resp 16   Ht 5\' 4"  (1.626 m)   Wt 149 lb 6.4 oz (67.8 kg)   LMP 12/25/2008   SpO2 99%   BMI 25.64 kg/m       Objective:   Physical Exam  General  Mental Status - Alert. General Appearance - Well groomed. Not in acute distress.  Skin Rashes- No Rashes.  HEENT Head- Normal. Ear Auditory Canal - Left- Normal. Right - Normal.Tympanic Membrane- Left-bright red right- Normal. Eye Sclera/Conjunctiva- Left- Normal. Right- Normal. Nose & Sinuses Nasal Mucosa- Left-  Boggy and Congested. Right-  Boggy and  Congested.Bilateral maxillary and frontal sinus pressure. Mouth & Throat Lips: Upper Lip- Normal: no dryness, cracking, pallor, cyanosis, or vesicular eruption. Lower Lip-Normal: no dryness, cracking, pallor, cyanosis or vesicular eruption. Buccal Mucosa- Bilateral- No Aphthous ulcers. Oropharynx- No Discharge or Erythema. Tonsils: Characteristics- Bilateral- No Erythema or Congestion. Size/Enlargement- Bilateral- No enlargement. Discharge- bilateral-None.  Neck Neck- Supple. No Masses.   Chest and Lung Exam Auscultation: Breath Sounds:- even and unlabored.  But scattered rough breath sounds with expiratory wheeze.  Cardiovascular Auscultation:Rythm-  Regular, rate and rhythm. Murmurs & Other Heart Sounds:Ausculatation of the heart reveal- No Murmurs.  Lymphatic Head & Neck General Head & Neck Lymphatics: Bilateral: Description- No Localized lymphadenopathy.       Assessment & Plan:  You appear to have bronchitis with some concern for pneumonia based on ausculation. Rest hydrate and tylenol for fever. I am prescribing cough medicine hycodan, and antibiotic(but want to review cxr first) For your nasal congestion flonase.  For wheezing albuterol. You may respond well but I do think you need to have prednisone available if wheezing persist/severe.  Cxr today. Will review xray and decide on antibiotic. You do have ear infection but if pneumonia  found on xray would write stronger antibiotic.  Did review chest x-ray and it was clear.  No pneumonia reported.  So I am prescribing Augmentin.  Follow up in 7-10 days or as needed  [employer], [employer]

## 2017-03-19 DIAGNOSIS — F3181 Bipolar II disorder: Secondary | ICD-10-CM | POA: Diagnosis not present

## 2017-04-08 MED FILL — lamoTRIgine 100 MG TABS: 100 | 30 days supply | Qty: 30 | Fill #5

## 2017-04-08 MED FILL — ARIPiprazole 10 MG TABS: 10 | 30 days supply | Qty: 30 | Fill #4

## 2017-04-08 MED FILL — BYSTOLIC 20 MG TABLET: 20 | 30 days supply | Qty: 30 | Fill #5

## 2017-04-15 DIAGNOSIS — H524 Presbyopia: Secondary | ICD-10-CM | POA: Diagnosis not present

## 2017-04-19 ENCOUNTER — Encounter: Payer: Self-pay | Admitting: Family

## 2017-04-19 ENCOUNTER — Ambulatory Visit: Payer: 59 | Admitting: Family

## 2017-04-19 VITALS — BP 153/76 | HR 69 | Temp 98.0°F | Resp 16 | Ht 64.0 in | Wt 163.0 lb

## 2017-04-19 DIAGNOSIS — E785 Hyperlipidemia, unspecified: Secondary | ICD-10-CM | POA: Diagnosis not present

## 2017-04-19 DIAGNOSIS — I10 Essential (primary) hypertension: Secondary | ICD-10-CM | POA: Diagnosis not present

## 2017-04-19 MED ORDER — CARVEDILOL 6.25 MG PO TABS: 6.2500 mg | ORAL_TABLET | Freq: Two times a day (BID) | ORAL | 3 refills | Status: DC

## 2017-04-19 MED FILL — CARVEDILOL 6.25 MG TAB: 6.25 | 90 days supply | Qty: 180 | Fill #0

## 2017-04-19 NOTE — Progress Notes (Signed)
Subjective:    Patient ID: Kaitlyn Harper, female    DOB: Jan 22, 1961, 57 y.o.   MRN: [id number]  HPI  Patient is a 57 yr old female who presents today for follow up of her hypertension.  Medications include exforge 10-320mg , bystolic 20mg , Denies cp/sob or swelling.   Review of Systems See HPI  Past Medical History:  Diagnosis Date  . Anemia   . Anxiety   . Depression   . Diabetes mellitus without complication ([place])    type 2-diet controlled  . Hyperglycemia   . Hyperlipidemia   . Hypertension      Social History   Socioeconomic History  . Marital status: Divorced    Spouse name: Not on file  . Number of children: 3  . Years of education: Not on file  . Highest education level: Not on file  Social Needs  . Financial resource strain: Not on file  . Food insecurity - worry: Not on file  . Food insecurity - inability: Not on file  . Transportation needs - medical: Not on file  . Transportation needs - non-medical: Not on file  Occupational History  . Occupation: [address]:  [employer]  Tobacco Use  . Smoking status: Former Smoker    Packs/day: 0.25    Types: Cigarettes    Last attempt to quit: 07/02/2015    Years since quitting: 1.8  . Smokeless tobacco: Never Used  . Tobacco comment: 10 cigarettes daily  Substance and Sexual Activity  . Alcohol use: Yes    Alcohol/week: 0.0 oz    Comment: socially  . Drug use: No  . Sexual activity: Yes    Birth control/protection: Post-menopausal  Other Topics Concern  . Not on file  Social History Narrative   Caffeine use:  2 drinks   Regular exercise: no       Past Surgical History:  Procedure Laterality Date  . APPENDECTOMY  1985  . CHOLECYSTECTOMY  1985  . COLONOSCOPY  02-28-2011   4 polyps   . DILATION AND CURETTAGE OF UTERUS  2005  . [place]  2002  . KNEE SURGERY  1983   arthroscopy?  2003 LEFT HEART CATH AND CORONARY ANGIOGRAPHY N/A 03/19/2016   Procedure: Left Heart Cath  and Coronary Angiography;  Surgeon: Peter M 15/06/2016, MD;  Location: [facility];  Service: Cardiovascular;  Laterality: N/A;  . MOUTH SURGERY  03/08/2016  . POLYPECTOMY  2006   ?anal polyp?  2007 POLYPECTOMY  02-28-2011   4 polyps- 3 of them TA   . TEAR DUCT PROBING  2004  . TUBAL LIGATION  1985    Family History  Problem Relation Age of Onset  . Hypertension Father   . Sarcoidosis Brother   . Alcohol abuse Brother   . Cirrhosis Brother   . Stroke Maternal Grandmother   . Colon cancer Neg Hx   . Colon polyps Neg Hx   . Esophageal cancer Neg Hx   . Rectal cancer Neg Hx   . Stomach cancer Neg Hx   . Pulmonary embolism Neg Hx     Allergies  Allergen Reactions  . Sulfonamide Derivatives Hives and Other (See Comments)    Light sensitivity    Current Outpatient Medications on File Prior to Visit  Medication Sig Dispense Refill  . albuterol (PROVENTIL HFA;VENTOLIN HFA) 108 (90 Base) MCG/ACT inhaler Inhale 2 puffs into the lungs every 6 (six) hours as needed for wheezing or  shortness of breath. 1 Inhaler 2  . amLODipine-valsartan (EXFORGE) 10-320 MG tablet Take 1 tablet daily by mouth. 90 tablet 1  . ARIPiprazole (ABILIFY) 10 MG tablet   2  . aspirin EC 81 MG tablet Take 81 mg by mouth daily.    [name] atorvastatin (LIPITOR) 20 MG tablet Take 1 tablet (20 mg total) daily by mouth. 90 tablet 1  . lamoTRIgine (LAMICTAL) 100 MG tablet Take 100 mg by mouth in the morning.  0  . lamoTRIgine (LAMICTAL) 150 MG tablet Take 1 tablet by mouth at bedtime.  0  . Nebivolol HCl (BYSTOLIC) 20 MG TABS Take 1 tablet (20 mg total) daily by mouth. 90 tablet 1  . nystatin ointment (MYCOSTATIN) Apply 1 application topically 2 (two) times daily.    . potassium chloride 20 MEQ/15ML (10%) SOLN Take 15 mLs (20 mEq total) daily by mouth. 900 mL 3  . traZODone (DESYREL) 50 MG tablet   2  . triamcinolone ointment (KENALOG) 0.1 % Apply 1 application topically 2 (two) times daily.     No current  facility-administered medications on file prior to visit.     BP (!) 153/76 (BP Location: Right Arm, Patient Position: Sitting, Cuff Size: Normal)   Pulse 69   Temp 98 F (36.7 C) (Oral)   Resp 16   Ht 5\' 4"  (1.626 m)   Wt 163 lb (73.9 kg)   LMP 12/25/2008   SpO2 100%   BMI 27.98 kg/m       Objective:   Physical Exam  Constitutional: She is oriented to person, place, and time. She appears well-developed and well-nourished.  Cardiovascular: Normal rate, regular rhythm and normal heart sounds.  No murmur heard. Pulmonary/Chest: Effort normal and breath sounds normal. No respiratory distress. She has no wheezes.  Musculoskeletal: She exhibits no edema.  Neurological: She is alert and oriented to person, place, and time.  Skin: Skin is warm and dry.  Psychiatric: She has a normal mood and affect. Her behavior is normal. Judgment and thought content normal.          Assessment & Plan:  HTN- bp slightly above goal. D/c bystolic and change to Coreg. Follow up in 1 week for bp recheck with RN. Check bmet today. Reports that she does not take potassium every day.   BP Readings from Last 3 Encounters:  04/19/17 (!) 153/76  03/14/17 (!) 150/79  12/17/16 140/80    Hyperlipidemia- tolerating statin, obtain follow up lipid panel.

## 2017-04-19 NOTE — Patient Instructions (Signed)
Stop bystolic, start coreg.

## 2017-04-20 LAB — BASIC METABOLIC PANEL
BUN: 12 mg/dL (ref 7–25)
CHLORIDE: 106 mmol/L (ref 98–110)
CO2: 25 mmol/L (ref 20–32)
CREATININE: 1.03 mg/dL (ref 0.50–1.05)
Calcium: 9.7 mg/dL (ref 8.6–10.4)
Glucose, Bld: 88 mg/dL (ref 65–99)
Potassium: 4 mmol/L (ref 3.5–5.3)
Sodium: 140 mmol/L (ref 135–146)

## 2017-05-03 ENCOUNTER — Ambulatory Visit (INDEPENDENT_AMBULATORY_CARE_PROVIDER_SITE_OTHER): Payer: 59 | Admitting: Family

## 2017-05-03 VITALS — BP 126/81 | HR 68

## 2017-05-03 DIAGNOSIS — I10 Essential (primary) hypertension: Secondary | ICD-10-CM

## 2017-05-03 NOTE — Progress Notes (Signed)
Pre visit review using our clinic review tool, if applicable. No additional management support is needed unless otherwise documented below in the visit note.  Pt here for blood pressure check per Debbrah Alar, NP.  BP Readings from Last 3 Encounters:  04/19/17 (!) 153/76  03/14/17 (!) 150/79  12/17/16 140/80   Pt currently taking: Carvedilol 6.25mg  twice a day Exforge 10-320mg  once a day.  BP today = 126/81 HR = 68  Advised pt per verbal from PCP to continue current medications and return in 3 months for follow up. Pt voices understanding. Appointment scheduled for 08/05/17 at 4pm.

## 2017-05-05 NOTE — Progress Notes (Signed)
Reviewed and agree.  Felipa Laroche S O'Sullivan NP 

## 2017-05-06 MED FILL — lamoTRIgine 100 MG TABS: 100 | 30 days supply | Qty: 30 | Fill #0

## 2017-05-06 MED FILL — ATORVASTATIN 20 MG TABLET: 20 | 90 days supply | Qty: 90 | Fill #1

## 2017-05-06 MED FILL — ARIPiprazole 10 MG TABS: 10 | 30 days supply | Qty: 30 | Fill #5

## 2017-05-20 MED FILL — lamoTRIgine 150 MG TABS: 150 | 30 days supply | Qty: 30 | Fill #2

## 2017-06-04 MED FILL — ARIPiprazole 10 MG TABS: 10 | 30 days supply | Qty: 30 | Fill #0

## 2017-06-05 ENCOUNTER — Encounter: Payer: Self-pay | Admitting: Medical

## 2017-06-05 ENCOUNTER — Ambulatory Visit (INDEPENDENT_AMBULATORY_CARE_PROVIDER_SITE_OTHER): Payer: 59 | Admitting: Medical

## 2017-06-05 VITALS — BP 133/76 | HR 73 | Temp 98.3°F | Resp 16 | Ht 64.0 in | Wt 165.3 lb

## 2017-06-05 DIAGNOSIS — H00012 Hordeolum externum right lower eyelid: Secondary | ICD-10-CM | POA: Diagnosis not present

## 2017-06-05 MED ORDER — TOBRAMYCIN 0.3 % OP SOLN: 2.0000 [drp] | Freq: Four times a day (QID) | OPHTHALMIC | 0 refills | Status: DC

## 2017-06-05 MED ORDER — AMOXICILLIN-POT CLAVULANATE 875-125 MG PO TABS: 1.0000 | ORAL_TABLET | Freq: Two times a day (BID) | ORAL | 0 refills | Status: DC

## 2017-06-05 MED FILL — TOBRAMYCIN 0.3 % SOLN: 0.3 | 13 days supply | Qty: 5 | Fill #0

## 2017-06-05 MED FILL — AMOX-CLAV 875-125 MG TABLET: 875-125 | 7 days supply | Qty: 14 | Fill #0

## 2017-06-05 NOTE — Progress Notes (Signed)
Subjective:    Patient ID: Kaitlyn Harper, female    DOB: 05/31/1960, 57 y.o.   MRN: [id number]  HPI  Pt in for evaluation.  She had bump on her rt  lower eye lash area for about 10 days. The area is still swollen. Slight irritated sensation. She states also larger lump under lash region last week. That are burst and she saw colored drainage and blood. The dc was whitish color.  Also about one month ago had swollen she had swollen area. She did not seek treatment. Only applied warm compresses and seemed to get better but then reoccurred.  Now still feels mild swollen to rt lower lid lateral aspect.   Review of Systems  Constitutional: Negative for chills, fatigue and fever.  HENT: Negative for congestion, facial swelling, sinus pressure, sinus pain, sore throat and tinnitus.   Eyes:       See  hpi.  Respiratory: Negative for cough, chest tightness, shortness of breath and wheezing.   Cardiovascular: Negative for chest pain and palpitations.  Gastrointestinal: Negative for abdominal pain.  Skin: Negative for rash.       See hpi on rt lower eye lid and exam.  Neurological: Negative for dizziness, seizures, syncope, weakness, numbness and headaches.  Hematological: Negative for adenopathy. Does not bruise/bleed easily.  Psychiatric/Behavioral: Negative for behavioral problems.   Past Medical History:  Diagnosis Date  . Anemia   . Anxiety   . Depression   . Diabetes mellitus without complication ([place])    type 2-diet controlled  . Hyperglycemia   . Hyperlipidemia   . Hypertension      Social History   Socioeconomic History  . Marital status: Divorced    Spouse name: Not on file  . Number of children: 3  . Years of education: Not on file  . Highest education level: Not on file  Occupational History  . Occupation: Water quality scientist: [employer]  Social Needs  . Financial resource strain: Not on file  . Food insecurity:    Worry: Not on file   Inability: Not on file  . Transportation needs:    Medical: Not on file    Non-medical: Not on file  Tobacco Use  . Smoking status: Former Smoker    Packs/day: 0.25    Types: Cigarettes    Last attempt to quit: 07/02/2015    Years since quitting: 1.9  . Smokeless tobacco: Never Used  . Tobacco comment: 10 cigarettes daily  Substance and Sexual Activity  . Alcohol use: Yes    Alcohol/week: 0.0 oz    Comment: socially  . Drug use: No  . Sexual activity: Yes    Birth control/protection: Post-menopausal  Lifestyle  . Physical activity:    Days per week: Not on file    Minutes per session: Not on file  . Stress: Not on file  Relationships  . Social connections:    Talks on phone: Not on file    Gets together: Not on file    Attends religious service: Not on file    Active member of club or organization: Not on file    Attends meetings of clubs or organizations: Not on file    Relationship status: Not on file  . Intimate partner violence:    Fear of current or ex partner: Not on file    Emotionally abused: Not on file    Physically abused: Not on file    Forced sexual  activity: Not on file  Other Topics Concern  . Not on file  Social History Narrative   Caffeine use:  2 drinks   Regular exercise: no       Past Surgical History:  Procedure Laterality Date  . APPENDECTOMY  1985  . CHOLECYSTECTOMY  1985  . COLONOSCOPY  02-28-2011   4 polyps   . DILATION AND CURETTAGE OF UTERUS  2005  . [place]  2002  . KNEE SURGERY  1983   arthroscopy?  [name] LEFT HEART CATH AND CORONARY ANGIOGRAPHY N/A 03/19/2016   Procedure: Left Heart Cath and Coronary Angiography;  Surgeon: Peter M Martinique, MD;  Location: [facility];  Service: Cardiovascular;  Laterality: N/A;  . MOUTH SURGERY  03/08/2016  . POLYPECTOMY  2006   ?anal polyp?  [name] POLYPECTOMY  02-28-2011   4 polyps- 3 of them TA   . TEAR DUCT PROBING  2004  . TUBAL LIGATION  1985    Family History  Problem Relation  Age of Onset  . Hypertension Father   . Sarcoidosis Brother   . Alcohol abuse Brother   . Cirrhosis Brother   . Stroke Maternal Grandmother   . Colon cancer Neg Hx   . Colon polyps Neg Hx   . Esophageal cancer Neg Hx   . Rectal cancer Neg Hx   . Stomach cancer Neg Hx   . Pulmonary embolism Neg Hx     Allergies  Allergen Reactions  . Sulfonamide Derivatives Hives and Other (See Comments)    Light sensitivity    Current Outpatient Medications on File Prior to Visit  Medication Sig Dispense Refill  . albuterol (PROVENTIL HFA;VENTOLIN HFA) 108 (90 Base) MCG/ACT inhaler Inhale 2 puffs into the lungs every 6 (six) hours as needed for wheezing or shortness of breath. 1 Inhaler 2  . amLODipine-valsartan (EXFORGE) 10-320 MG tablet Take 1 tablet daily by mouth. 90 tablet 1  . ARIPiprazole (ABILIFY) 10 MG tablet   2  . aspirin EC 81 MG tablet Take 81 mg by mouth daily.    [name] atorvastatin (LIPITOR) 20 MG tablet Take 1 tablet (20 mg total) daily by mouth. 90 tablet 1  . carvedilol (COREG) 6.25 MG tablet Take 1 tablet (6.25 mg total) by mouth 2 (two) times daily with a meal. 60 tablet 3  . lamoTRIgine (LAMICTAL) 100 MG tablet Take 100 mg by mouth in the morning.  0  . lamoTRIgine (LAMICTAL) 150 MG tablet Take 1 tablet by mouth at bedtime.  0  . nystatin ointment (MYCOSTATIN) Apply 1 application topically 2 (two) times daily.    . potassium chloride 20 MEQ/15ML (10%) SOLN Take 15 mLs (20 mEq total) daily by mouth. 900 mL 3  . traZODone (DESYREL) 50 MG tablet   2  . triamcinolone ointment (KENALOG) 0.1 % Apply 1 application topically 2 (two) times daily.     No current facility-administered medications on file prior to visit.     BP 133/76   Pulse 73   Temp 98.3 F (36.8 C) (Oral)   Resp 16   Ht 5\' 4"  (1.626 m)   Wt 165 lb 4.8 oz (75 kg)   LMP 12/25/2008   SpO2 100%   BMI 28.37 kg/m       Objective:   Physical Exam  General  Mental Status - Alert. General Appearance - Well  groomed. Not in acute distress.  Skin Rashes- No Rashes.  HEENT Head- Normal. Ear Auditory Canal - Left- Normal.  Right - Normal.Tympanic Membrane- Left- Normal. Right- Normal. Eye Sclera/Conjunctiva- Left- Normal. Right- Normal.   Rt lower lid- lower aspect near corner has mild swollen area feel like residual stye. Prior larger swollen area described resolved. Other lids rt upper and left side are normal.   Nose & Sinuses Nasal Mucosa- Left- Not Boggy and Congested. Right- Not  Boggy and  Congested.Bilateral no  maxillary and no  frontal sinus pressure. Mouth & Throat Lips: Upper Lip- Normal: no dryness, cracking, pallor, cyanosis, or vesicular eruption. Lower Lip-Normal: no dryness, cracking, pallor, cyanosis or vesicular eruption. Buccal Mucosa- Bilateral- No Aphthous ulcers. Oropharynx- No Discharge or Erythema. Tonsils: Characteristics- Bilateral- No Erythema or Congestion. Size/Enlargement- Bilateral- No enlargement. Discharge- bilateral-None.  Neck Neck- Supple. No Masses.   Chest and Lung Exam Auscultation: Breath Sounds:-Clear even and unlabored.  Cardiovascular Auscultation:Rythm- Regular, rate and rhythm. Murmurs & Other Heart Sounds:Ausculatation of the heart reveal- No Murmurs.  Lymphatic Head & Neck General Head & Neck Lymphatics: Bilateral: Description- No Localized lymphadenopathy.       Assessment & Plan:  You do describe probable recent stye over the last 10 days and likely stye 1 month ago. Residual small stye presently.  The larger swollen area that drained may have been forming chalazion.  However no severe significant swelling present now.  Since you have had this twice in the past month, I do want you to go ahead and use Tobrex eyedrops and oral antibiotic Augmentin.  Also can use warm compresses twice daily to right lower eyelid for the next 4 to 5 days.  Follow-up in 10 to 14 days or if needed.  If you have third recurrent flare of this over  the next 1 to 2 months then would go ahead and refer you to ophthalmologist or optometrist.  Mackie Pai, PA-C

## 2017-06-05 NOTE — Patient Instructions (Signed)
You do describe probable recent stye over the last 10 days and likely stye 1 month ago. Residual small stye presently.  The larger swollen area that drained may have been forming chalazion.  However no severe significant swelling present now.  Since you have had this twice in the past month, I do want you to go ahead and use Tobrex eyedrops and oral antibiotic Augmentin.  Also can use warm compresses twice daily to right lower eyelid for the next 4 to 5 days.  Follow-up in 10 to 14 days or if needed.  If you have third recurrent flare of this over the next 1 to 2 months then would go ahead and refer you to ophthalmologist or optometrist.

## 2017-06-11 MED FILL — AMLODIPINE-VALSARTAN 10-320: 10-320 | 90 days supply | Qty: 90 | Fill #1

## 2017-06-11 MED FILL — lamoTRIgine 100 MG TABS: 100 | 30 days supply | Qty: 30 | Fill #0

## 2017-06-13 DIAGNOSIS — N898 Other specified noninflammatory disorders of vagina: Secondary | ICD-10-CM | POA: Diagnosis not present

## 2017-06-13 DIAGNOSIS — Z113 Encounter for screening for infections with a predominantly sexual mode of transmission: Secondary | ICD-10-CM | POA: Diagnosis not present

## 2017-06-13 DIAGNOSIS — R87610 Atypical squamous cells of undetermined significance on cytologic smear of cervix (ASC-US): Secondary | ICD-10-CM | POA: Diagnosis not present

## 2017-06-13 DIAGNOSIS — Z6826 Body mass index (BMI) 26.0-26.9, adult: Secondary | ICD-10-CM | POA: Diagnosis not present

## 2017-06-13 DIAGNOSIS — N951 Menopausal and female climacteric states: Secondary | ICD-10-CM | POA: Diagnosis not present

## 2017-06-13 DIAGNOSIS — A5900 Urogenital trichomoniasis, unspecified: Secondary | ICD-10-CM | POA: Insufficient documentation

## 2017-06-13 DIAGNOSIS — D259 Leiomyoma of uterus, unspecified: Secondary | ICD-10-CM | POA: Diagnosis not present

## 2017-06-13 DIAGNOSIS — A599 Trichomoniasis, unspecified: Secondary | ICD-10-CM | POA: Insufficient documentation

## 2017-06-13 DIAGNOSIS — Z01411 Encounter for gynecological examination (general) (routine) with abnormal findings: Secondary | ICD-10-CM | POA: Diagnosis not present

## 2017-06-13 MED FILL — metroNIDAZOLE 500 MG TABS: 500 | 1 days supply | Qty: 4 | Fill #0

## 2017-06-17 LAB — HM PAP SMEAR: HM Pap smear: NORMAL

## 2017-06-24 MED FILL — ARIPiprazole 10 MG TABS: 10 | 30 days supply | Qty: 30 | Fill #1

## 2017-06-24 MED FILL — traZODone HCL 50 MG TABS: 50 | 30 days supply | Qty: 60 | Fill #0

## 2017-07-10 MED FILL — lamoTRIgine 100 MG TABS: 100 | 30 days supply | Qty: 60 | Fill #0

## 2017-07-11 DIAGNOSIS — A599 Trichomoniasis, unspecified: Secondary | ICD-10-CM | POA: Diagnosis not present

## 2017-07-23 MED FILL — CARVEDILOL 6.25 MG TAB: 6.25 | 30 days supply | Qty: 60 | Fill #1

## 2017-07-29 MED FILL — ARIPiprazole 10 MG TABS: 10 | 30 days supply | Qty: 30 | Fill #2

## 2017-08-05 ENCOUNTER — Ambulatory Visit: Payer: 59 | Admitting: Family

## 2017-08-05 ENCOUNTER — Encounter: Payer: Self-pay | Admitting: Family

## 2017-08-05 VITALS — BP 142/81 | HR 77 | Temp 98.4°F | Resp 16 | Ht 64.0 in | Wt 164.4 lb

## 2017-08-05 DIAGNOSIS — H00012 Hordeolum externum right lower eyelid: Secondary | ICD-10-CM

## 2017-08-05 DIAGNOSIS — I10 Essential (primary) hypertension: Secondary | ICD-10-CM

## 2017-08-05 MED ORDER — CIPROFLOXACIN HCL 0.3 % OP SOLN: OPHTHALMIC | 0 refills | Status: DC

## 2017-08-05 MED FILL — lamoTRIgine 100 MG TABS: 100 | 30 days supply | Qty: 60 | Fill #1

## 2017-08-05 MED FILL — CIPROFLOXACIN 0.3% EYE DRO: 0.3 | 9 days supply | Qty: 5 | Fill #0

## 2017-08-05 NOTE — Progress Notes (Signed)
Subjective:    Patient ID: Kaitlyn Harper, female    DOB: [birth year], 57 y.o.   MRN: [id number]  HPI   Patient is a 57 year old female who presents today for follow-up of her blood pressure.  Last visit we discontinued Bystolic and started Coreg.  Follow-up nurse visit blood pressure check was improved.  She reports tolerating Coreg without difficulty.  BP Readings from Last 3 Encounters:  08/05/17 (!) 142/81  06/05/17 133/76  05/03/17 126/81   Eye problem-she was seen by another provider for this back in April.  She was treated with tobramycin drops.  She reports that she continues to have a recurrence diet on the right lower lip.  She reports that will come to ahead and "burst."  She reports that that she then gets some discharge in her eye that is sticky.  She denies any visual changes.  Uses tobramycin drops without improvement.  Has also tried warm compresses.  Reports that the stye comes and goes in the same area of the right lower lid.  Lab Results  Component Value Date   HGBA1C 5.7 12/17/2016     Review of Systems See HPI  Past Medical History:  Diagnosis Date  . Anemia   . Anxiety   . Depression   . Diabetes mellitus without complication ([place])    type 2-diet controlled  . Hyperglycemia   . Hyperlipidemia   . Hypertension      Social History   Socioeconomic History  . Marital status: Divorced    Spouse name: Not on file  . Number of children: 3  . Years of education: Not on file  . Highest education level: Not on file  Occupational History  . Occupation: Water quality scientist: [employer]  Social Needs  . Financial resource strain: Not on file  . Food insecurity:    Worry: Not on file    Inability: Not on file  . Transportation needs:    Medical: Not on file    Non-medical: Not on file  Tobacco Use  . Smoking status: Former Smoker    Packs/day: 0.25    Types: Cigarettes    Last attempt to quit: 07/02/2015    Years since quitting: 2.0    . Smokeless tobacco: Never Used  . Tobacco comment: 10 cigarettes daily  Substance and Sexual Activity  . Alcohol use: Yes    Alcohol/week: 0.0 oz    Comment: socially  . Drug use: No  . Sexual activity: Yes    Birth control/protection: Post-menopausal  Lifestyle  . Physical activity:    Days per week: Not on file    Minutes per session: Not on file  . Stress: Not on file  Relationships  . Social connections:    Talks on phone: Not on file    Gets together: Not on file    Attends religious service: Not on file    Active member of club or organization: Not on file    Attends meetings of clubs or organizations: Not on file    Relationship status: Not on file  . Intimate partner violence:    Fear of current or ex partner: Not on file    Emotionally abused: Not on file    Physically abused: Not on file    Forced sexual activity: Not on file  Other Topics Concern  . Not on file  Social History Narrative   Caffeine use:  2 drinks   Regular exercise: no  Past Surgical History:  Procedure Laterality Date  . APPENDECTOMY  1985  . CHOLECYSTECTOMY  1985  . COLONOSCOPY  02-28-2011   4 polyps   . DILATION AND CURETTAGE OF UTERUS  2005  . [place]  2002  . KNEE SURGERY  1983   arthroscopy?  [name] LEFT HEART CATH AND CORONARY ANGIOGRAPHY N/A 03/19/2016   Procedure: Left Heart Cath and Coronary Angiography;  Surgeon: Peter M Martinique, MD;  Location: [facility];  Service: Cardiovascular;  Laterality: N/A;  . MOUTH SURGERY  03/08/2016  . POLYPECTOMY  2006   ?anal polyp?  [name] POLYPECTOMY  02-28-2011   4 polyps- 3 of them TA   . TEAR DUCT PROBING  2004  . TUBAL LIGATION  1985    Family History  Problem Relation Age of Onset  . Hypertension Father   . Sarcoidosis Brother   . Alcohol abuse Brother   . Cirrhosis Brother   . Stroke Maternal Grandmother   . Colon cancer Neg Hx   . Colon polyps Neg Hx   . Esophageal cancer Neg Hx   . Rectal cancer Neg Hx   .  Stomach cancer Neg Hx   . Pulmonary embolism Neg Hx     Allergies  Allergen Reactions  . Sulfonamide Derivatives Hives and Other (See Comments)    Light sensitivity    Current Outpatient Medications on File Prior to Visit  Medication Sig Dispense Refill  . albuterol (PROVENTIL HFA;VENTOLIN HFA) 108 (90 Base) MCG/ACT inhaler Inhale 2 puffs into the lungs every 6 (six) hours as needed for wheezing or shortness of breath. 1 Inhaler 2  . amLODipine-valsartan (EXFORGE) 10-320 MG tablet Take 1 tablet daily by mouth. 90 tablet 1  . ARIPiprazole (ABILIFY) 10 MG tablet   2  . aspirin EC 81 MG tablet Take 81 mg by mouth daily.    [name] atorvastatin (LIPITOR) 20 MG tablet Take 1 tablet (20 mg total) daily by mouth. 90 tablet 1  . carvedilol (COREG) 6.25 MG tablet Take 1 tablet (6.25 mg total) by mouth 2 (two) times daily with a meal. 60 tablet 3  . lamoTRIgine (LAMICTAL) 100 MG tablet Take 100 mg by mouth 2 (two) times daily.   0  . nystatin ointment (MYCOSTATIN) Apply 1 application topically 2 (two) times daily.    . potassium chloride 20 MEQ/15ML (10%) SOLN Take 15 mLs (20 mEq total) daily by mouth. 900 mL 3  . traZODone (DESYREL) 50 MG tablet   2  . triamcinolone ointment (KENALOG) 0.1 % Apply 1 application topically 2 (two) times daily.     No current facility-administered medications on file prior to visit.     BP (!) 142/81 (BP Location: Right Arm, Cuff Size: Normal)   Pulse 77   Temp 98.4 F (36.9 C) (Oral)   Resp 16   Ht 5\' 4"  (1.626 m)   Wt 164 lb 6.4 oz (74.6 kg)   LMP 12/25/2008   SpO2 99%   BMI 28.22 kg/m       Objective:   Physical Exam  Constitutional: She appears well-developed and well-nourished.  Eyes:    Some swelling of the right upper and lower lid.  Small stye noted on right lower lid  Cardiovascular: Normal rate, regular rhythm and normal heart sounds.  No murmur heard. Pulmonary/Chest: Effort normal and breath sounds normal. No respiratory distress. She has no  wheezes.  Psychiatric: She has a normal mood and affect. Her behavior is normal. Judgment and  thought content normal.          Assessment & Plan:  Hypertension-blood pressure is up slightly today because she forgot to take her a.m. medication.Reinforced importance of compliance.  We will plan to recheck her blood pressure in 3 months.  Stye-I sent a prescription for Cipro eyedrops to see if these help in the event of recurrent eye discharge.  I have also placed a referral to ophthalmology for further evaluation.

## 2017-08-05 NOTE — Patient Instructions (Signed)
Please continue current blood pressure medications. Begin ciloxan drops. You should be contacted about your referral to the eye doctor for your recurrent stye.

## 2017-08-06 ENCOUNTER — Other Ambulatory Visit: Payer: Self-pay | Admitting: Family

## 2017-08-06 MED FILL — ATORVASTATIN 20 MG TABLET: 20 | 90 days supply | Qty: 90 | Fill #0

## 2017-08-12 DIAGNOSIS — F3181 Bipolar II disorder: Secondary | ICD-10-CM | POA: Diagnosis not present

## 2017-08-20 ENCOUNTER — Telehealth: Payer: Self-pay | Admitting: Family

## 2017-08-20 NOTE — Telephone Encounter (Signed)
Noted faxed to below #.

## 2017-08-20 NOTE — Telephone Encounter (Signed)
Copied from [place] [phone]. Topic: General - Other >> Aug 20, 2017 11:37 AM Cecelia Byars, NT wrote: Reason for CRM: [facility] called and needs the most recent PCP notes faxed to [phone] or call [phone] in order to schedule the patient an appointment ,

## 2017-08-27 ENCOUNTER — Other Ambulatory Visit: Payer: Self-pay | Admitting: Family

## 2017-08-27 MED FILL — traZODone HCL 50 MG TABS: 50 | 30 days supply | Qty: 60 | Fill #1

## 2017-08-27 MED FILL — CARVEDILOL 6.25 MG TABLET: 6.25 | 90 days supply | Qty: 180 | Fill #0

## 2017-09-02 DIAGNOSIS — H00022 Hordeolum internum right lower eyelid: Secondary | ICD-10-CM | POA: Diagnosis not present

## 2017-09-02 LAB — HM DIABETES EYE EXAM

## 2017-09-02 MED FILL — DOXYCYCLINE HYCLATE 100 MG: 100 | 14 days supply | Qty: 14 | Fill #0

## 2017-09-02 MED FILL — NEO/POLY/DEXAMET EYE OINT: 3.5-10000-0 | 30 days supply | Qty: 4 | Fill #0

## 2017-09-03 ENCOUNTER — Other Ambulatory Visit: Payer: Self-pay | Admitting: Family

## 2017-09-03 MED FILL — lamoTRIgine 100 MG TABS: 100 | 90 days supply | Qty: 180 | Fill #0

## 2017-09-03 MED FILL — AMLODIPINE-VALSARTAN 10-320: 10-320 | 90 days supply | Qty: 90 | Fill #0

## 2017-09-03 MED FILL — ARIPiprazole 10 MG TABS: 10 | 30 days supply | Qty: 30 | Fill #3

## 2017-09-19 LAB — HM DIABETES EYE EXAM

## 2017-09-27 DIAGNOSIS — H00022 Hordeolum internum right lower eyelid: Secondary | ICD-10-CM | POA: Diagnosis not present

## 2017-10-17 MED FILL — ARIPiprazole 10 MG TABS: 10 | 30 days supply | Qty: 30 | Fill #4

## 2017-10-18 MED FILL — traZODone HCL 50 MG TABS: 50 | 90 days supply | Qty: 180 | Fill #0

## 2017-11-04 ENCOUNTER — Encounter: Payer: Self-pay | Admitting: Family

## 2017-11-04 ENCOUNTER — Ambulatory Visit: Payer: 59 | Admitting: Family

## 2017-11-04 VITALS — BP 130/84 | HR 78 | Temp 98.2°F | Resp 16 | Ht 64.0 in | Wt 168.8 lb

## 2017-11-04 DIAGNOSIS — E785 Hyperlipidemia, unspecified: Secondary | ICD-10-CM

## 2017-11-04 DIAGNOSIS — I10 Essential (primary) hypertension: Secondary | ICD-10-CM | POA: Diagnosis not present

## 2017-11-04 DIAGNOSIS — E119 Type 2 diabetes mellitus without complications: Secondary | ICD-10-CM

## 2017-11-04 DIAGNOSIS — Z0000 Encounter for general adult medical examination without abnormal findings: Secondary | ICD-10-CM

## 2017-11-04 MED FILL — ATORVASTATIN CALCIUM 20 MG: 20 | 90 days supply | Qty: 90 | Fill #1

## 2017-11-04 NOTE — Progress Notes (Signed)
Subjective:    Patient ID: Kaitlyn Harper, female    DOB: 1960/11/02, 57 y.o.   MRN: [id number]  HPI   Patient is a 57 yr old female who presents today for follow up.  HTN- maintained on coreg, exforge. She denies SOB or swelling.   BP Readings from Last 3 Encounters:  11/04/17 130/84  08/05/17 (!) 142/81  06/05/17 133/76    Hyperlipidemia- maintained on atorvastatin.   Lab Results  Component Value Date   CHOL 89 03/20/2016   HDL 24 (L) 03/20/2016   LDLCALC 47 03/20/2016   TRIG 90 03/20/2016   CHOLHDL 3.7 03/20/2016    DM2- diet controlled.  Lab Results  Component Value Date   HGBA1C 5.7 12/17/2016   HGBA1C 5.7 (H) 03/20/2016   HGBA1C 5.8 08/02/2015   Lab Results  Component Value Date   MICROALBUR 1.8 08/02/2015   LDLCALC 47 03/20/2016   CREATININE 1.03 04/19/2017       Review of Systems    see HPI  Past Medical History:  Diagnosis Date  . Anemia   . Anxiety   . Depression   . Diabetes mellitus without complication ([place])    type 2-diet controlled  . Hyperglycemia   . Hyperlipidemia   . Hypertension      Social History   Socioeconomic History  . Marital status: Divorced    Spouse name: Not on file  . Number of children: 3  . Years of education: Not on file  . Highest education level: Not on file  Occupational History  . Occupation: Water quality scientist: [employer]  Social Needs  . Financial resource strain: Not on file  . Food insecurity:    Worry: Not on file    Inability: Not on file  . Transportation needs:    Medical: Not on file    Non-medical: Not on file  Tobacco Use  . Smoking status: Former Smoker    Packs/day: 0.25    Types: Cigarettes    Last attempt to quit: 07/02/2015    Years since quitting: 2.3  . Smokeless tobacco: Never Used  . Tobacco comment: 10 cigarettes daily  Substance and Sexual Activity  . Alcohol use: Yes    Alcohol/week: 0.0 standard drinks    Comment: socially  . Drug use: No  . Sexual  activity: Yes    Birth control/protection: Post-menopausal  Lifestyle  . Physical activity:    Days per week: Not on file    Minutes per session: Not on file  . Stress: Not on file  Relationships  . Social connections:    Talks on phone: Not on file    Gets together: Not on file    Attends religious service: Not on file    Active member of club or organization: Not on file    Attends meetings of clubs or organizations: Not on file    Relationship status: Not on file  . Intimate partner violence:    Fear of current or ex partner: Not on file    Emotionally abused: Not on file    Physically abused: Not on file    Forced sexual activity: Not on file  Other Topics Concern  . Not on file  Social History Narrative   Caffeine use:  2 drinks   Regular exercise: no       Past Surgical History:  Procedure Laterality Date  . APPENDECTOMY  1985  . CHOLECYSTECTOMY  1985  . COLONOSCOPY  02-28-2011   4 polyps   . DILATION AND CURETTAGE OF UTERUS  2005  . [place]  2002  . KNEE SURGERY  1983   arthroscopy?  [name] LEFT HEART CATH AND CORONARY ANGIOGRAPHY N/A 03/19/2016   Procedure: Left Heart Cath and Coronary Angiography;  Surgeon: Peter M Martinique, MD;  Location: [facility];  Service: Cardiovascular;  Laterality: N/A;  . MOUTH SURGERY  03/08/2016  . POLYPECTOMY  2006   ?anal polyp?  [name] POLYPECTOMY  02-28-2011   4 polyps- 3 of them TA   . TEAR DUCT PROBING  2004  . TUBAL LIGATION  1985    Family History  Problem Relation Age of Onset  . Hypertension Father   . Sarcoidosis Brother   . Alcohol abuse Brother   . Cirrhosis Brother   . Stroke Maternal Grandmother   . Colon cancer Neg Hx   . Colon polyps Neg Hx   . Esophageal cancer Neg Hx   . Rectal cancer Neg Hx   . Stomach cancer Neg Hx   . Pulmonary embolism Neg Hx     Allergies  Allergen Reactions  . Sulfonamide Derivatives Hives and Other (See Comments)    Light sensitivity    Current Outpatient  Medications on File Prior to Visit  Medication Sig Dispense Refill  . albuterol (PROVENTIL HFA;VENTOLIN HFA) 108 (90 Base) MCG/ACT inhaler Inhale 2 puffs into the lungs every 6 (six) hours as needed for wheezing or shortness of breath. 1 Inhaler 2  . amLODipine-valsartan (EXFORGE) 10-320 MG tablet TAKE 1 TABLET DAILY BY MOUTH. 90 tablet 1  . ARIPiprazole (ABILIFY) 10 MG tablet   2  . aspirin EC 81 MG tablet Take 81 mg by mouth daily.    [name] atorvastatin (LIPITOR) 20 MG tablet TAKE 1 TABLET (20 MG TOTAL) DAILY BY MOUTH. 90 tablet 1  . carvedilol (COREG) 6.25 MG tablet TAKE 1 TABLET (6.25 MG TOTAL) BY MOUTH 2 TIMES DAILY WITH A MEAL. 180 tablet 0  . lamoTRIgine (LAMICTAL) 100 MG tablet Take 100 mg by mouth 2 (two) times daily.   0  . nystatin ointment (MYCOSTATIN) Apply 1 application topically 2 (two) times daily.    . potassium chloride 20 MEQ/15ML (10%) SOLN Take 15 mLs (20 mEq total) daily by mouth. 900 mL 3  . traZODone (DESYREL) 50 MG tablet   2  . triamcinolone ointment (KENALOG) 0.1 % Apply 1 application topically 2 (two) times daily.     No current facility-administered medications on file prior to visit.     BP 130/84 (BP Location: Right Arm, Cuff Size: Normal)   Pulse 78   Temp 98.2 F (36.8 C) (Oral)   Resp 16   Ht 5\' 4"  (1.626 m)   Wt 168 lb 12.8 oz (76.6 kg)   LMP 12/25/2008   SpO2 97%   BMI 28.97 kg/m    Objective:   Physical Exam  Constitutional: She appears well-developed and well-nourished.  Cardiovascular: Normal rate, regular rhythm and normal heart sounds.  No murmur heard. Pulmonary/Chest: Effort normal and breath sounds normal. No respiratory distress. She has no wheezes.  Psychiatric: She has a normal mood and affect. Her behavior is normal. Judgment and thought content normal.          Assessment & Plan:  HTN- bp is stable, continue current meds.   Hyperlipidemia- tolerating stain, obtain follow up lipid panel.   DM2- clinically stable, obtain  follow up A1C.

## 2017-11-04 NOTE — Patient Instructions (Signed)
Please complete lab work prior to leaving.   

## 2017-11-05 ENCOUNTER — Other Ambulatory Visit: Payer: Self-pay | Admitting: Family

## 2017-11-05 DIAGNOSIS — Z1231 Encounter for screening mammogram for malignant neoplasm of breast: Secondary | ICD-10-CM

## 2017-11-05 LAB — COMPREHENSIVE METABOLIC PANEL
ALT: 17 U/L (ref 0–35)
AST: 16 U/L (ref 0–37)
Albumin: 4.4 g/dL (ref 3.5–5.2)
Alkaline Phosphatase: 90 U/L (ref 39–117)
BUN: 11 mg/dL (ref 6–23)
CO2: 27 meq/L (ref 19–32)
CREATININE: 0.96 mg/dL (ref 0.40–1.20)
Calcium: 9.7 mg/dL (ref 8.4–10.5)
Chloride: 105 mEq/L (ref 96–112)
GFR: 76.37 mL/min (ref >60.00)
Glucose, Bld: 85 mg/dL (ref 70–99)
POTASSIUM: 3.9 meq/L (ref 3.5–5.1)
SODIUM: 139 meq/L (ref 135–145)
Total Bilirubin: 0.4 mg/dL (ref 0.2–1.2)
Total Protein: 7.7 g/dL (ref 6.0–8.3)

## 2017-11-05 LAB — LIPID PANEL
CHOL/HDL RATIO: 4
Cholesterol: 120 mg/dL (ref 0–200)
HDL: 30.5 mg/dL — ABNORMAL LOW (ref >39.00)
LDL CALC: 64 mg/dL (ref 0–99)
NonHDL: 89.95
Triglycerides: 131 mg/dL (ref 0.0–149.0)
VLDL: 26.2 mg/dL (ref 0.0–40.0)

## 2017-11-11 MED FILL — ARIPiprazole 10 MG TABS: 10 | 30 days supply | Qty: 30 | Fill #5

## 2017-11-25 ENCOUNTER — Other Ambulatory Visit: Payer: Self-pay | Admitting: Family

## 2017-11-25 MED FILL — CARVEDILOL 6.25 MG TABLET: 6.25 | 90 days supply | Qty: 180 | Fill #0

## 2017-12-03 ENCOUNTER — Ambulatory Visit
Admission: RE | Admit: 2017-12-03 | Discharge: 2017-12-03 | Disposition: A | Discharge status: Home / Self Care | Payer: 59 | Source: Ambulatory Visit | Attending: Family | Admitting: Family

## 2017-12-03 DIAGNOSIS — Z1231 Encounter for screening mammogram for malignant neoplasm of breast: Secondary | ICD-10-CM | POA: Diagnosis not present

## 2017-12-10 MED FILL — ARIPiprazole 10 MG TABS: 10 | 90 days supply | Qty: 90 | Fill #0

## 2017-12-10 MED FILL — AMLODIPINE-VALSARTAN 10-320: 10-320 | 90 days supply | Qty: 90 | Fill #1

## 2017-12-10 MED FILL — lamoTRIgine 100 MG TABS: 100 | 90 days supply | Qty: 180 | Fill #1

## 2018-01-03 MED FILL — PREVIDENT 5000 SENSITIVE PA: 1.1-5 | 20 days supply | Qty: 100 | Fill #0

## 2018-01-17 MED FILL — CHLORHEXIDINE 0.12% RINSE: 0.12 | 16 days supply | Qty: 473 | Fill #0

## 2018-02-03 ENCOUNTER — Other Ambulatory Visit: Payer: Self-pay | Admitting: Family

## 2018-02-03 MED FILL — ATORVASTATIN CALCIUM 20 MG: 20 | 90 days supply | Qty: 90 | Fill #0

## 2018-02-10 ENCOUNTER — Encounter: Payer: 59 | Admitting: Family

## 2018-02-17 ENCOUNTER — Encounter: Payer: 59 | Admitting: Family

## 2018-02-17 DIAGNOSIS — Z0289 Encounter for other administrative examinations: Secondary | ICD-10-CM

## 2018-02-21 ENCOUNTER — Ambulatory Visit: Payer: 59 | Admitting: Family

## 2018-02-21 ENCOUNTER — Encounter: Payer: Self-pay | Admitting: Family

## 2018-02-21 VITALS — BP 127/63 | HR 76 | Temp 98.7°F | Resp 16 | Ht 64.0 in | Wt 166.0 lb

## 2018-02-21 DIAGNOSIS — F418 Other specified anxiety disorders: Secondary | ICD-10-CM

## 2018-02-21 DIAGNOSIS — E119 Type 2 diabetes mellitus without complications: Secondary | ICD-10-CM

## 2018-02-21 DIAGNOSIS — I10 Essential (primary) hypertension: Secondary | ICD-10-CM | POA: Diagnosis not present

## 2018-02-21 NOTE — Progress Notes (Signed)
Subjective:    Patient ID: Kaitlyn Harper, female    DOB: 04/20/1960, 58 y.o.   MRN: [id number]  HPI  Patient is a 58 yr old female who presents today for follow up.  DM2- diet controlled. Lab Results  Component Value Date   HGBA1C 5.7 12/17/2016   HGBA1C 5.7 (H) 03/20/2016   HGBA1C 5.8 08/02/2015   Lab Results  Component Value Date   MICROALBUR 1.8 08/02/2015   LDLCALC 64 11/04/2017   CREATININE 0.96 11/04/2017   Hyperlipidemia- maintained on atorvastatin.   Lab Results  Component Value Date   CHOL 120 11/04/2017   HDL 30.50 (L) 11/04/2017   LDLCALC 64 11/04/2017   TRIG 131.0 11/04/2017   CHOLHDL 4 11/04/2017     Review of Systems See HPI  Past Medical History:  Diagnosis Date  . Anemia   . Anxiety   . Depression   . Diabetes mellitus without complication ([place])    type 2-diet controlled  . Hyperglycemia   . Hyperlipidemia   . Hypertension      Social History   Socioeconomic History  . Marital status: Divorced    Spouse name: Not on file  . Number of children: 3  . Years of education: Not on file  . Highest education level: Not on file  Occupational History  . Occupation: Water quality scientist: [employer]  Social Needs  . Financial resource strain: Not on file  . Food insecurity:    Worry: Not on file    Inability: Not on file  . Transportation needs:    Medical: Not on file    Non-medical: Not on file  Tobacco Use  . Smoking status: Former Smoker    Packs/day: 0.25    Types: Cigarettes    Last attempt to quit: 07/02/2015    Years since quitting: 2.6  . Smokeless tobacco: Never Used  . Tobacco comment: 10 cigarettes daily  Substance and Sexual Activity  . Alcohol use: Yes    Alcohol/week: 0.0 standard drinks    Comment: socially  . Drug use: No  . Sexual activity: Yes    Birth control/protection: Post-menopausal  Lifestyle  . Physical activity:    Days per week: Not on file    Minutes per session: Not on file  .  Stress: Not on file  Relationships  . Social connections:    Talks on phone: Not on file    Gets together: Not on file    Attends religious service: Not on file    Active member of club or organization: Not on file    Attends meetings of clubs or organizations: Not on file    Relationship status: Not on file  . Intimate partner violence:    Fear of current or ex partner: Not on file    Emotionally abused: Not on file    Physically abused: Not on file    Forced sexual activity: Not on file  Other Topics Concern  . Not on file  Social History Narrative   Caffeine use:  2 drinks   Regular exercise: no       Past Surgical History:  Procedure Laterality Date  . APPENDECTOMY  1985  . CHOLECYSTECTOMY  1985  . COLONOSCOPY  02-28-2011   4 polyps   . DILATION AND CURETTAGE OF UTERUS  2005  . [place]  2002  . KNEE SURGERY  1983   arthroscopy?  [name] LEFT HEART CATH AND CORONARY ANGIOGRAPHY  N/A 03/19/2016   Procedure: Left Heart Cath and Coronary Angiography;  Surgeon: Peter M Martinique, MD;  Location: [facility];  Service: Cardiovascular;  Laterality: N/A;  . MOUTH SURGERY  03/08/2016  . POLYPECTOMY  2006   ?anal polyp?  [name] POLYPECTOMY  02-28-2011   4 polyps- 3 of them TA   . TEAR DUCT PROBING  2004  . TUBAL LIGATION  1985    Family History  Problem Relation Age of Onset  . Hypertension Father   . Sarcoidosis Brother   . Alcohol abuse Brother   . Cirrhosis Brother   . Stroke Maternal Grandmother   . Colon cancer Neg Hx   . Colon polyps Neg Hx   . Esophageal cancer Neg Hx   . Rectal cancer Neg Hx   . Stomach cancer Neg Hx   . Pulmonary embolism Neg Hx     Allergies  Allergen Reactions  . Sulfonamide Derivatives Hives and Other (See Comments)    Light sensitivity    Current Outpatient Medications on File Prior to Visit  Medication Sig Dispense Refill  . albuterol (PROVENTIL HFA;VENTOLIN HFA) 108 (90 Base) MCG/ACT inhaler Inhale 2 puffs into the lungs every  6 (six) hours as needed for wheezing or shortness of breath. 1 Inhaler 2  . amLODipine-valsartan (EXFORGE) 10-320 MG tablet TAKE 1 TABLET DAILY BY MOUTH. 90 tablet 1  . ARIPiprazole (ABILIFY) 10 MG tablet   2  . aspirin EC 81 MG tablet Take 81 mg by mouth daily.    [name] atorvastatin (LIPITOR) 20 MG tablet TAKE 1 TABLET BY MOUTH ONCE DAILY 90 tablet 1  . carvedilol (COREG) 6.25 MG tablet TAKE 1 TABLET BY MOUTH TWICE DAILY WITH A MEAL 180 tablet 0  . lamoTRIgine (LAMICTAL) 100 MG tablet Take 100 mg by mouth 2 (two) times daily.   0  . nystatin ointment (MYCOSTATIN) Apply 1 application topically 2 (two) times daily.    . potassium chloride 20 MEQ/15ML (10%) SOLN Take 15 mLs (20 mEq total) daily by mouth. 900 mL 3  . traZODone (DESYREL) 50 MG tablet   2  . triamcinolone ointment (KENALOG) 0.1 % Apply 1 application topically 2 (two) times daily.     No current facility-administered medications on file prior to visit.     BP 127/63 (BP Location: Right Arm, Patient Position: Sitting, Cuff Size: Small)   Pulse 76   Temp 98.7 F (37.1 C) (Oral)   Resp 16   Ht 5\' 4"  (1.626 m)   Wt 166 lb (75.3 kg)   LMP 12/25/2008   SpO2 100%   BMI 28.49 kg/m       Objective:   Physical Exam Constitutional:      Appearance: She is well-developed.  Neck:     Musculoskeletal: Neck supple.     Thyroid: No thyromegaly.  Cardiovascular:     Rate and Rhythm: Normal rate and regular rhythm.     Heart sounds: Normal heart sounds. No murmur.  Pulmonary:     Effort: Pulmonary effort is normal. No respiratory distress.     Breath sounds: Normal breath sounds. No wheezing.  Skin:    General: Skin is warm and dry.  Neurological:     Mental Status: She is alert and oriented to person, place, and time.  Psychiatric:        Behavior: Behavior normal.        Thought Content: Thought content normal.        Judgment: Judgment normal.  Assessment & Plan:  DM2- obtain follow up  a1c.  Hyperlipidemia- tolerating statin, lipids stable. Continue statin.   Anxiety/depression- continues to follow with psychiatry and notes that mood is stable on current meds.

## 2018-02-24 ENCOUNTER — Other Ambulatory Visit (INDEPENDENT_AMBULATORY_CARE_PROVIDER_SITE_OTHER): Payer: 59

## 2018-02-24 DIAGNOSIS — I10 Essential (primary) hypertension: Secondary | ICD-10-CM | POA: Diagnosis not present

## 2018-02-24 DIAGNOSIS — E119 Type 2 diabetes mellitus without complications: Secondary | ICD-10-CM | POA: Diagnosis not present

## 2018-02-24 LAB — BASIC METABOLIC PANEL
BUN: 9 mg/dL (ref 6–23)
CHLORIDE: 105 meq/L (ref 96–112)
CO2: 25 mEq/L (ref 19–32)
CREATININE: 1.11 mg/dL (ref 0.40–1.20)
Calcium: 9.7 mg/dL (ref 8.4–10.5)
GFR: 64.53 mL/min (ref >60.00)
GLUCOSE: 128 mg/dL — AB (ref 70–99)
POTASSIUM: 4.5 meq/L (ref 3.5–5.1)
Sodium: 139 mEq/L (ref 135–145)

## 2018-02-24 LAB — HEMOGLOBIN A1C: Hgb A1c MFr Bld: 5.9 % (ref 4.6–6.5)

## 2018-02-24 NOTE — Addendum Note (Signed)
Addended by: Kelle Darting A on: 02/24/2018 08:06 AM   Modules accepted: Orders

## 2018-03-10 ENCOUNTER — Other Ambulatory Visit: Payer: Self-pay | Admitting: Family

## 2018-03-10 MED FILL — CARVEDILOL 6.25 MG TABLET: 6.25 | 90 days supply | Qty: 180 | Fill #0

## 2018-03-13 DIAGNOSIS — F3181 Bipolar II disorder: Secondary | ICD-10-CM | POA: Diagnosis not present

## 2018-03-17 DIAGNOSIS — H524 Presbyopia: Secondary | ICD-10-CM | POA: Diagnosis not present

## 2018-03-17 LAB — HM DIABETES EYE EXAM

## 2018-03-18 ENCOUNTER — Other Ambulatory Visit: Payer: Self-pay | Admitting: Family

## 2018-03-18 ENCOUNTER — Telehealth: Payer: Self-pay | Admitting: *Deleted

## 2018-03-18 MED FILL — lamoTRIgine 100 MG TABS: 100 | 90 days supply | Qty: 180 | Fill #0

## 2018-03-18 MED FILL — ARIPiprazole 10 MG TABS: 10 | 90 days supply | Qty: 90 | Fill #1

## 2018-03-18 NOTE — Telephone Encounter (Signed)
Received Diabetic Eye Exam Report from [facility]; forwarded to provider/SLS 02/04

## 2018-03-19 MED FILL — AMLODIPINE-VALSARTAN 10-320: 10-320 | 90 days supply | Qty: 90 | Fill #0

## 2018-05-14 MED FILL — ATORVASTATIN 20 MG TABLET: 20 | 90 days supply | Qty: 90 | Fill #1

## 2018-05-14 MED FILL — traZODone HCL 50 MG TABS: 50 | 90 days supply | Qty: 180 | Fill #0

## 2018-06-02 ENCOUNTER — Encounter: Payer: 59 | Admitting: Family

## 2018-06-03 ENCOUNTER — Encounter: Payer: 59 | Admitting: Family

## 2018-06-13 MED FILL — CARVEDILOL 6.25 MG TABLET: 6.25 | 90 days supply | Qty: 180 | Fill #1

## 2018-06-13 MED FILL — AMLODIPINE-VALSARTAN 10-320: 10-320 | 90 days supply | Qty: 90 | Fill #1

## 2018-06-26 MED FILL — LAMOTRIGINE 100 MG TABS: 100 | 90 days supply | Qty: 180 | Fill #0

## 2018-06-26 MED FILL — ARIPiprazole 10 MG TABS: 10 | 90 days supply | Qty: 90 | Fill #0

## 2018-08-05 ENCOUNTER — Telehealth: Payer: Self-pay | Admitting: Family

## 2018-08-05 ENCOUNTER — Other Ambulatory Visit: Payer: Self-pay

## 2018-08-05 ENCOUNTER — Encounter: Payer: Self-pay | Admitting: Family

## 2018-08-05 ENCOUNTER — Ambulatory Visit (INDEPENDENT_AMBULATORY_CARE_PROVIDER_SITE_OTHER): Payer: 59 | Admitting: Family

## 2018-08-05 VITALS — BP 141/80 | HR 75 | Temp 98.4°F | Resp 16 | Ht 64.0 in | Wt 164.0 lb

## 2018-08-05 DIAGNOSIS — Z23 Encounter for immunization: Secondary | ICD-10-CM | POA: Diagnosis not present

## 2018-08-05 DIAGNOSIS — E119 Type 2 diabetes mellitus without complications: Secondary | ICD-10-CM | POA: Diagnosis not present

## 2018-08-05 DIAGNOSIS — Z0000 Encounter for general adult medical examination without abnormal findings: Secondary | ICD-10-CM | POA: Diagnosis not present

## 2018-08-05 NOTE — Telephone Encounter (Signed)
Please request pap smear result from [facility].

## 2018-08-05 NOTE — Progress Notes (Signed)
Subjective:    Patient ID: Kaitlyn Harper, female    DOB: Nov 22, 1960, 58 y.o.   MRN: [id number]  HPI  Patient presents today for complete physical.  Immunizations: 2010 tdap Diet: trying to eat healthy Wt Readings from Last 3 Encounters:  08/05/18 164 lb (74.4 kg)  02/21/18 166 lb (75.3 kg)  11/04/17 168 lb 12.8 oz (76.6 kg)  Exercise: walks on treadmill 2-3 times a week for 35. Colonoscopy: 2017- due in 2022 Dexa: due Pap Smear: per GYN, vanessa haygood. Will see Earnstine Regal.   Mammogram: 10/19 Dental: up to date Vision: up to date    Review of Systems  Constitutional: Negative for unexpected weight change.  HENT: Negative for hearing loss and rhinorrhea.   Eyes: Negative for visual disturbance.  Respiratory: Negative for cough.   Cardiovascular: Negative for leg swelling.  Gastrointestinal: Negative for blood in stool, constipation and diarrhea.  Genitourinary: Negative for dysuria, frequency and hematuria.  Musculoskeletal: Negative for arthralgias and myalgias.  Skin: Negative for rash.  Neurological: Negative for headaches.  Hematological: Negative for adenopathy.  Psychiatric/Behavioral:       Denies depression/anxiety   Past Medical History:  Diagnosis Date  . Anemia   . Anxiety   . Depression   . Diabetes mellitus without complication ([place])    type 2-diet controlled  . Hyperglycemia   . Hyperlipidemia   . Hypertension      Social History   Socioeconomic History  . Marital status: Divorced    Spouse name: Not on file  . Number of children: 3  . Years of education: Not on file  . Highest education level: Not on file  Occupational History  . Occupation: Water quality scientist: [employer]  Social Needs  . Financial resource strain: Not on file  . Food insecurity    Worry: Not on file    Inability: Not on file  . Transportation needs    Medical: Not on file    Non-medical: Not on file  Tobacco Use  . Smoking status: Former  Smoker    Packs/day: 0.25    Types: Cigarettes    Quit date: 07/02/2015    Years since quitting: 3.0  . Smokeless tobacco: Never Used  . Tobacco comment: 10 cigarettes daily  Substance and Sexual Activity  . Alcohol use: Yes    Alcohol/week: 0.0 standard drinks    Comment: socially  . Drug use: No  . Sexual activity: Yes    Birth control/protection: Post-menopausal  Lifestyle  . Physical activity    Days per week: Not on file    Minutes per session: Not on file  . Stress: Not on file  Relationships  . Social Herbalist on phone: Not on file    Gets together: Not on file    Attends religious service: Not on file    Active member of club or organization: Not on file    Attends meetings of clubs or organizations: Not on file    Relationship status: Not on file  . Intimate partner violence    Fear of current or ex partner: Not on file    Emotionally abused: Not on file    Physically abused: Not on file    Forced sexual activity: Not on file  Other Topics Concern  . Not on file  Social History Narrative   Caffeine use:  2 drinks   Regular exercise: no  Past Surgical History:  Procedure Laterality Date  . APPENDECTOMY  1985  . CHOLECYSTECTOMY  1985  . COLONOSCOPY  02-28-2011   4 polyps   . DILATION AND CURETTAGE OF UTERUS  2005  . [place]  2002  . KNEE SURGERY  1983   arthroscopy?  [name] LEFT HEART CATH AND CORONARY ANGIOGRAPHY N/A 03/19/2016   Procedure: Left Heart Cath and Coronary Angiography;  Surgeon: Peter M Martinique, MD;  Location: [facility];  Service: Cardiovascular;  Laterality: N/A;  . MOUTH SURGERY  03/08/2016  . POLYPECTOMY  2006   ?anal polyp?  [name] POLYPECTOMY  02-28-2011   4 polyps- 3 of them TA   . TEAR DUCT PROBING  2004  . TUBAL LIGATION  1985    Family History  Problem Relation Age of Onset  . Hypertension Father   . Sarcoidosis Brother   . Alcohol abuse Brother   . Cirrhosis Brother   . Stroke Maternal Grandmother    . Colon cancer Neg Hx   . Colon polyps Neg Hx   . Esophageal cancer Neg Hx   . Rectal cancer Neg Hx   . Stomach cancer Neg Hx   . Pulmonary embolism Neg Hx     Allergies  Allergen Reactions  . Sulfonamide Derivatives Hives and Other (See Comments)    Light sensitivity    Current Outpatient Medications on File Prior to Visit  Medication Sig Dispense Refill  . albuterol (PROVENTIL HFA;VENTOLIN HFA) 108 (90 Base) MCG/ACT inhaler Inhale 2 puffs into the lungs every 6 (six) hours as needed for wheezing or shortness of breath. 1 Inhaler 2  . amLODipine-valsartan (EXFORGE) 10-320 MG tablet TAKE 1 TABLET DAILY BY MOUTH. 90 tablet 1  . ARIPiprazole (ABILIFY) 10 MG tablet   2  . aspirin EC 81 MG tablet Take 81 mg by mouth daily.    [name] atorvastatin (LIPITOR) 20 MG tablet TAKE 1 TABLET BY MOUTH ONCE DAILY 90 tablet 1  . carvedilol (COREG) 6.25 MG tablet TAKE 1 TABLET BY MOUTH TWICE DAILY WITH A MEAL 180 tablet 1  . lamoTRIgine (LAMICTAL) 100 MG tablet Take 100 mg by mouth 2 (two) times daily.   0  . nystatin ointment (MYCOSTATIN) Apply 1 application topically 2 (two) times daily.    . potassium chloride 20 MEQ/15ML (10%) SOLN Take 15 mLs (20 mEq total) daily by mouth. 900 mL 3  . traZODone (DESYREL) 50 MG tablet   2  . triamcinolone ointment (KENALOG) 0.1 % Apply 1 application topically 2 (two) times daily.     No current facility-administered medications on file prior to visit.     BP (!) 141/80 (BP Location: Right Arm, Patient Position: Sitting, Cuff Size: Small)   Pulse 75   Temp 98.4 F (36.9 C) (Oral)   Resp 16   Ht 5\' 4"  (1.626 m)   Wt 164 lb (74.4 kg)   LMP 12/25/2008   SpO2 100%   BMI 28.15 kg/m        Objective:   Physical Exam  Physical Exam  Constitutional: She is oriented to person, place, and time. She appears well-developed and well-nourished. No distress.  HENT:  Head: Normocephalic and atraumatic.  Right Ear: Tympanic membrane and ear canal normal.  Left Ear:  Tympanic membrane and ear canal normal.  Mouth/Throat: not examined- wearing mask Eyes: Pupils are equal, round, and reactive to light. No scleral icterus.  Neck: Normal range of motion. No thyromegaly present.  Cardiovascular: Normal rate and  regular rhythm.   No murmur heard. Pulmonary/Chest: Effort normal and breath sounds normal. No respiratory distress. He has no wheezes. She has no rales. She exhibits no tenderness.  Abdominal: Soft. Bowel sounds are normal. She exhibits no distension and no mass. There is no tenderness. There is no rebound and no guarding.  Musculoskeletal: She exhibits no edema.  Lymphadenopathy:    She has no cervical adenopathy.  Neurological: She is alert and oriented to person, place, and time. She has normal patellar reflexes. She exhibits normal muscle tone. Coordination normal.  Skin: Skin is warm and dry.  Psychiatric: She has a normal mood and affect. Her behavior is normal. Judgment and thought content normal.    A/P:  Preventative care- encouraged pt to increase exercise to 5 days a week. Continue healthy diet, Pap up to date- will request copy. Mammo/colo up to date.  Obtain dexa.  DM2- check A1C.  HTN- BP is is fair- continue current dose of exforge.  BP Readings from Last 3 Encounters:  08/05/18 (!) 141/80  02/21/18 127/63  11/04/17 130/84          Assessment & Plan:

## 2018-08-05 NOTE — Patient Instructions (Signed)
Continue healthy diet and regular exercise.

## 2018-08-05 NOTE — Addendum Note (Signed)
Addended by: Jiles Prows on: 08/05/2018 04:14 PM   Modules accepted: Orders

## 2018-08-06 ENCOUNTER — Ambulatory Visit (HOSPITAL_BASED_OUTPATIENT_CLINIC_OR_DEPARTMENT_OTHER)
Admission: RE | Admit: 2018-08-06 | Discharge: 2018-08-06 | Disposition: A | Discharge status: Home / Self Care | Payer: 59 | Source: Ambulatory Visit | Attending: Family | Admitting: Family

## 2018-08-06 ENCOUNTER — Other Ambulatory Visit: Payer: Self-pay | Admitting: Family

## 2018-08-06 DIAGNOSIS — Z780 Asymptomatic menopausal state: Secondary | ICD-10-CM | POA: Diagnosis not present

## 2018-08-06 DIAGNOSIS — Z0000 Encounter for general adult medical examination without abnormal findings: Secondary | ICD-10-CM | POA: Diagnosis not present

## 2018-08-06 LAB — CBC WITH DIFFERENTIAL/PLATELET
Basophils Absolute: 0 10*3/uL (ref 0.0–0.1)
Basophils Relative: 0.3 % (ref 0.0–3.0)
Eosinophils Absolute: 0 10*3/uL (ref 0.0–0.7)
Eosinophils Relative: 0.4 % (ref 0.0–5.0)
HCT: 38.8 % (ref 36.0–46.0)
Hemoglobin: 13.4 g/dL (ref 12.0–15.0)
Lymphocytes Relative: 43.9 % (ref 12.0–46.0)
Lymphs Abs: 3.2 10*3/uL (ref 0.7–4.0)
MCHC: 34.5 g/dL (ref 30.0–36.0)
MCV: 93.4 fl (ref 78.0–100.0)
Monocytes Absolute: 0.6 10*3/uL (ref 0.1–1.0)
Monocytes Relative: 7.9 % (ref 3.0–12.0)
Neutro Abs: 3.5 10*3/uL (ref 1.4–7.7)
Neutrophils Relative %: 47.5 % (ref 43.0–77.0)
Platelets: 281 10*3/uL (ref 150.0–400.0)
RBC: 4.15 Mil/uL (ref 3.87–5.11)
RDW: 13 % (ref 11.5–15.5)
WBC: 7.4 10*3/uL (ref 4.0–10.5)

## 2018-08-06 LAB — HEPATIC FUNCTION PANEL
ALT: 18 U/L (ref 0–35)
AST: 17 U/L (ref 0–37)
Albumin: 4.5 g/dL (ref 3.5–5.2)
Alkaline Phosphatase: 89 U/L (ref 39–117)
Bilirubin, Direct: 0.1 mg/dL (ref 0.0–0.3)
Total Bilirubin: 0.4 mg/dL (ref 0.2–1.2)
Total Protein: 7.3 g/dL (ref 6.0–8.3)

## 2018-08-06 LAB — LIPID PANEL
Cholesterol: 111 mg/dL (ref 0–200)
HDL: 30.8 mg/dL — ABNORMAL LOW (ref >39.00)
LDL Cholesterol: 55 mg/dL (ref 0–99)
NonHDL: 79.78
Total CHOL/HDL Ratio: 4
Triglycerides: 123 mg/dL (ref 0.0–149.0)
VLDL: 24.6 mg/dL (ref 0.0–40.0)

## 2018-08-06 LAB — BASIC METABOLIC PANEL
BUN: 11 mg/dL (ref 6–23)
CO2: 24 mEq/L (ref 19–32)
Calcium: 9.4 mg/dL (ref 8.4–10.5)
Chloride: 109 mEq/L (ref 96–112)
Creatinine, Ser: 0.95 mg/dL (ref 0.40–1.20)
GFR: 72.55 mL/min (ref >60.00)
Glucose, Bld: 101 mg/dL — ABNORMAL HIGH (ref 70–99)
Potassium: 3.5 mEq/L (ref 3.5–5.1)
Sodium: 141 mEq/L (ref 135–145)

## 2018-08-06 LAB — TSH: TSH: 2.78 u[IU]/mL (ref 0.35–4.50)

## 2018-08-06 LAB — HEMOGLOBIN A1C: Hgb A1c MFr Bld: 5.9 % (ref 4.6–6.5)

## 2018-08-06 MED FILL — ATORVASTATIN 20 MG TABLET: 20 | 90 days supply | Qty: 90 | Fill #0

## 2018-08-06 NOTE — Telephone Encounter (Signed)
Medical records request form faxed to [facility]

## 2018-08-22 ENCOUNTER — Telehealth: Payer: Self-pay | Admitting: Family

## 2018-08-22 NOTE — Telephone Encounter (Signed)
Kaitlyn Harper (Patient) Kaitlyn Harper (Patient) Complaint - Billing/Coding  DOS: 02/17/2018  Details of complaint: Patient calling and states that she received Harper bill for Harper No Show appointment for 02/17/2018. States that her appointment was at 3:20pm and she was there at 3:25pm. States that there were 2 people checking in infront of her and onceit was her turn for check in, she was told that she was 10 mins late. Patient does not understand why she received Harper No Show fee for this appointment when she was there in line.  How would the patient like to see this issue resolved? Would like Harper call from someone in office to discuss.      Will automatically be routed to [facility] pool.    SWP on 08/21/18 and informed pt the $50 No Show fee would be removed from her account and that it may take up to 30 days for it be resolved through billing.  Pt was informed we do this as Harper one time courtesy.  Email sent to request no show fee removal on 08/21/2018.

## 2018-09-09 DIAGNOSIS — F3181 Bipolar II disorder: Secondary | ICD-10-CM | POA: Diagnosis not present

## 2018-09-13 MED FILL — traZODone HCL 50 MG TABS: 50 | 90 days supply | Qty: 180 | Fill #0

## 2018-09-13 MED FILL — ARIPiprazole 10 MG TABS: 10 | 90 days supply | Qty: 90 | Fill #0

## 2018-09-13 MED FILL — LAMOTRIGINE 100 MG TABS: 100 | 90 days supply | Qty: 180 | Fill #0

## 2018-09-18 ENCOUNTER — Other Ambulatory Visit: Payer: Self-pay | Admitting: Family

## 2018-09-18 DIAGNOSIS — Z6828 Body mass index (BMI) 28.0-28.9, adult: Secondary | ICD-10-CM | POA: Diagnosis not present

## 2018-09-18 DIAGNOSIS — Z01419 Encounter for gynecological examination (general) (routine) without abnormal findings: Secondary | ICD-10-CM | POA: Diagnosis not present

## 2018-09-18 DIAGNOSIS — Z124 Encounter for screening for malignant neoplasm of cervix: Secondary | ICD-10-CM | POA: Diagnosis not present

## 2018-09-18 LAB — HM PAP SMEAR: HM Pap smear: NEGATIVE

## 2018-09-18 MED FILL — CARVEDILOL 6.25 MG TABLET: 6.25 | 90 days supply | Qty: 180 | Fill #0

## 2018-09-18 MED FILL — AMLODIPINE-VALSARTAN 10-320: 10-320 | 90 days supply | Qty: 90 | Fill #0

## 2018-10-16 ENCOUNTER — Ambulatory Visit (INDEPENDENT_AMBULATORY_CARE_PROVIDER_SITE_OTHER): Payer: 59

## 2018-10-16 ENCOUNTER — Other Ambulatory Visit: Payer: Self-pay

## 2018-10-16 ENCOUNTER — Ambulatory Visit (HOSPITAL_COMMUNITY)
Admission: EM | Admit: 2018-10-16 | Discharge: 2018-10-16 | Disposition: A | Discharge status: Home / Self Care | Payer: 59 | Attending: Internal Medicine | Admitting: Internal Medicine

## 2018-10-16 ENCOUNTER — Encounter (HOSPITAL_COMMUNITY): Payer: Self-pay

## 2018-10-16 DIAGNOSIS — N10 Acute tubulo-interstitial nephritis: Secondary | ICD-10-CM

## 2018-10-16 DIAGNOSIS — R109 Unspecified abdominal pain: Secondary | ICD-10-CM | POA: Diagnosis not present

## 2018-10-16 LAB — BASIC METABOLIC PANEL
Anion gap: 13 (ref 5–15)
BUN: 10 mg/dL (ref 6–20)
CO2: 22 mmol/L (ref 22–32)
Calcium: 9.2 mg/dL (ref 8.9–10.3)
Chloride: 103 mmol/L (ref 98–111)
Creatinine, Ser: 1.1 mg/dL — ABNORMAL HIGH (ref 0.44–1.00)
GFR calc Af Amer: >60 mL/min (ref >60)
GFR calc non Af Amer: 55 mL/min — ABNORMAL LOW (ref >60)
Glucose, Bld: 110 mg/dL — ABNORMAL HIGH (ref 70–99)
Potassium: 3.3 mmol/L — ABNORMAL LOW (ref 3.5–5.1)
Sodium: 138 mmol/L (ref 135–145)

## 2018-10-16 LAB — CBC WITH DIFFERENTIAL/PLATELET
Abs Immature Granulocytes: 0.17 10*3/uL — ABNORMAL HIGH (ref 0.00–0.07)
Basophils Absolute: 0.1 10*3/uL (ref 0.0–0.1)
Basophils Relative: 0 %
Eosinophils Absolute: 0.2 10*3/uL (ref 0.0–0.5)
Eosinophils Relative: 1 %
HCT: 41.1 % (ref 36.0–46.0)
Hemoglobin: 14.2 g/dL (ref 12.0–15.0)
Immature Granulocytes: 1 %
Lymphocytes Relative: 7 %
Lymphs Abs: 1.5 10*3/uL (ref 0.7–4.0)
MCH: 32.1 pg (ref 26.0–34.0)
MCHC: 34.5 g/dL (ref 30.0–36.0)
MCV: 92.8 fL (ref 80.0–100.0)
Monocytes Absolute: 1.1 10*3/uL — ABNORMAL HIGH (ref 0.1–1.0)
Monocytes Relative: 5 %
Neutro Abs: 19.5 10*3/uL — ABNORMAL HIGH (ref 1.7–7.7)
Neutrophils Relative %: 86 %
Platelets: 254 10*3/uL (ref 150–400)
RBC: 4.43 MIL/uL (ref 3.87–5.11)
RDW: 13.1 % (ref 11.5–15.5)
WBC: 22.6 10*3/uL — ABNORMAL HIGH (ref 4.0–10.5)
nRBC: 0 % (ref 0.0–0.2)

## 2018-10-16 LAB — POCT URINALYSIS DIP (DEVICE)
Glucose, UA: 100 mg/dL — AB
Ketones, ur: 15 mg/dL — AB
Leukocytes,Ua: NEGATIVE
Nitrite: POSITIVE — AB
Protein, ur: >=300 mg/dL — AB
Specific Gravity, Urine: 1.025 (ref 1.005–1.030)
Urobilinogen, UA: >=8 mg/dL (ref 0.0–1.0)
pH: 5.5 (ref 5.0–8.0)

## 2018-10-16 MED ORDER — LEVOFLOXACIN 750 MG PO TABS: 750.0000 mg | ORAL_TABLET | Freq: Every day | ORAL | 0 refills | Status: DC

## 2018-10-16 MED ORDER — LIDOCAINE HCL (PF) 1 % IJ SOLN
INTRAMUSCULAR | Status: AC
  Filled 2018-10-16: qty 2

## 2018-10-16 MED ORDER — CEFTRIAXONE SODIUM 1 G IJ SOLR
INTRAMUSCULAR | Status: AC
  Filled 2018-10-16: qty 10

## 2018-10-16 MED ORDER — CEFTRIAXONE SODIUM 1 G IJ SOLR
1.0000 g | Freq: Once | INTRAMUSCULAR | Status: AC
  Administered 2018-10-16: 18:00:00 1 g via INTRAMUSCULAR

## 2018-10-16 MED ORDER — ONDANSETRON 4 MG PO TBDP: 4.0000 mg | ORAL_TABLET | Freq: Three times a day (TID) | ORAL | 0 refills | Status: DC | PRN

## 2018-10-16 NOTE — ED Provider Notes (Addendum)
[place]    CSN: [id number] Arrival date & time: 10/16/18  1618      History   Chief Complaint Chief Complaint  Patient presents with  . Constipation    HPI Kaitlyn Harper is a 58 y.o. female with history of diabetes mellitus type 2-controlled, hypertension-controlled comes to the urgent care with abdominal pain and abdominal bloating of 3 days duration.  Patient describes abdominal pain in the lower half of the abdomen.  Last bowel movement was on Monday.  She is felt nauseated but has not vomited.  She denies any fever or chills.  No upper respiratory infection symptoms.  No sore throat.  Patient denies any weight loss.   Patient has a history of colitis several years ago.  Patient has chills and a fever of 101 on evaluation in the urgent care.  She has some dysuria and lower abdominal pain.  HPI  Past Medical History:  Diagnosis Date  . Anemia   . Anxiety   . Depression   . Diabetes mellitus without complication ([place])    type 2-diet controlled  . Hyperglycemia   . Hyperlipidemia   . Hypertension     Patient Active Problem List   Diagnosis Date Noted  . NSTEMI (non-ST elevated myocardial infarction) ([place]) 03/19/2016  . Syncope 03/18/2016  . Hypokalemia 03/18/2016  . Tobacco abuse 06/20/2015  . Intertrigo 09/27/2014  . Left shoulder pain 12/14/2013  . Seasonal allergies 05/23/2011  . Hot flashes, menopausal 01/29/2011  . General medical examination 01/29/2011  . Weight gain 12/26/2010  . Vitamin D deficiency 12/26/2010  . Hyperlipidemia 12/26/2010  . Diabetes type 2, controlled ([place]) 12/26/2010  . Anxiety and depression 08/19/2008  . Essential hypertension 08/19/2008  . IRRITABLE BOWEL SYNDROME 08/19/2008    Past Surgical History:  Procedure Laterality Date  . APPENDECTOMY  1985  . CHOLECYSTECTOMY  1985  . COLONOSCOPY  02-28-2011   4 polyps   . DILATION AND CURETTAGE OF UTERUS  2005  . [place]  2002  . KNEE SURGERY  1983   arthroscopy?  [name] LEFT HEART CATH AND CORONARY ANGIOGRAPHY N/A 03/19/2016   Procedure: Left Heart Cath and Coronary Angiography;  Surgeon: Peter M Martinique, MD;  Location: [facility];  Service: Cardiovascular;  Laterality: N/A;  . MOUTH SURGERY  03/08/2016  . POLYPECTOMY  2006   ?anal polyp?  [name] POLYPECTOMY  02-28-2011   4 polyps- 3 of them TA   . TEAR DUCT PROBING  2004  . TUBAL LIGATION  1985    OB History    Gravida  3   Para  3   Term      Preterm      AB      Living        SAB      TAB      Ectopic      Multiple      Live Births               Home Medications    Prior to Admission medications   Medication Sig Start Date End Date Taking? Authorizing Provider  albuterol (PROVENTIL HFA;VENTOLIN HFA) 108 (90 Base) MCG/ACT inhaler Inhale 2 puffs into the lungs every 6 (six) hours as needed for wheezing or shortness of breath. 03/14/17   Saguier, Percell Miller, PA-C  amLODipine-valsartan (EXFORGE) 10-320 MG tablet TAKE 1 TABLET BY MOUTH ONCE DAILY 09/18/18   Debbrah Alar, NP  ARIPiprazole (ABILIFY) 10 MG tablet  03/01/16  [provider]  aspirin EC 81 MG tablet Take 81 mg by mouth daily.    [provider]  atorvastatin (LIPITOR) 20 MG tablet TAKE 1 TABLET BY MOUTH ONCE DAILY 08/06/18   Debbrah Alar, NP  carvedilol (COREG) 6.25 MG tablet TAKE 1 TABLET BY MOUTH TWICE DAILY WITH A MEAL 09/18/18   Debbrah Alar, NP  lamoTRIgine (LAMICTAL) 100 MG tablet Take 100 mg by mouth 2 (two) times daily.  07/21/15   [provider]  levofloxacin (LEVAQUIN) 750 MG tablet Take 1 tablet (750 mg total) by mouth daily for 7 days. 10/16/18 10/23/18  LampteyMyrene Galas, MD  nystatin ointment (MYCOSTATIN) Apply 1 application topically 2 (two) times daily.    [provider]  ondansetron (ZOFRAN ODT) 4 MG disintegrating tablet Take 1 tablet (4 mg total) by mouth every 8 (eight) hours as needed for nausea or vomiting. 10/16/18   Niall Illes, Myrene Galas, MD   potassium chloride 20 MEQ/15ML (10%) SOLN Take 15 mLs (20 mEq total) daily by mouth. 12/17/16   Debbrah Alar, NP  traZODone (DESYREL) 50 MG tablet  02/21/16   [provider]  triamcinolone ointment (KENALOG) 0.1 % Apply 1 application topically 2 (two) times daily.    [provider]  amLODipine-valsartan (EXFORGE) 10-320 MG tablet TAKE 1 TABLET DAILY BY MOUTH. 03/18/18   Debbrah Alar, NP  carvedilol (COREG) 6.25 MG tablet TAKE 1 TABLET BY MOUTH TWICE DAILY WITH A MEAL 03/10/18   Debbrah Alar, NP    Family History Family History  Problem Relation Age of Onset  . Hypertension Father   . Sarcoidosis Brother   . Alcohol abuse Brother   . Cirrhosis Brother   . Stroke Maternal Grandmother   . Colon cancer Neg Hx   . Colon polyps Neg Hx   . Esophageal cancer Neg Hx   . Rectal cancer Neg Hx   . Stomach cancer Neg Hx   . Pulmonary embolism Neg Hx     Social History Social History   Tobacco Use  . Smoking status: Former Smoker    Packs/day: 0.25    Types: Cigarettes    Quit date: 07/02/2015    Years since quitting: 3.2  . Smokeless tobacco: Never Used  . Tobacco comment: 10 cigarettes daily  Substance Use Topics  . Alcohol use: Yes    Alcohol/week: 0.0 standard drinks    Comment: socially  . Drug use: No     Allergies   Sulfonamide derivatives   Review of Systems Review of Systems  Constitutional: Positive for activity change and fever. Negative for chills and fatigue.  HENT: Negative.   Respiratory: Negative for cough, chest tightness and shortness of breath.   Cardiovascular: Negative for chest pain and palpitations.  Gastrointestinal: Positive for abdominal distention, abdominal pain, constipation, nausea and rectal pain. Negative for blood in stool, diarrhea and vomiting.  Genitourinary: Negative for dysuria, frequency, hematuria and urgency.  Musculoskeletal: Negative.   Skin: Negative.   Neurological: Negative for dizziness,  weakness and headaches.     Physical Exam Triage Vital Signs ED Triage Vitals  Enc Vitals Group     BP 10/16/18 1707 131/89     Pulse Rate 10/16/18 1707 85     Resp 10/16/18 1707 16     Temp 10/16/18 1707 (!) 101 F (38.3 C)     Temp Source 10/16/18 1707 Oral     SpO2 10/16/18 1707 97 %     Weight 10/16/18 1706 157 lb (71.2 kg)  Height --      Head Circumference --      Peak Flow --      Pain Score 10/16/18 1706 10     Pain Loc --      Pain Edu? --      Excl. in [place]? --    No data found.  Updated Vital Signs BP 131/89 (BP Location: Right Arm)   Pulse 85   Temp (!) 101 F (38.3 C) (Oral)   Resp 16   Wt 71.2 kg   LMP 12/25/2008   SpO2 97%   BMI 26.95 kg/m   Visual Acuity Right Eye Distance:   Left Eye Distance:   Bilateral Distance:    Right Eye Near:   Left Eye Near:    Bilateral Near:     Physical Exam Vitals signs and nursing note reviewed.  Constitutional:      General: She is in acute distress.     Appearance: She is ill-appearing.  Cardiovascular:     Rate and Rhythm: Normal rate and regular rhythm.     Pulses: Normal pulses.  Pulmonary:     Effort: Pulmonary effort is normal. No respiratory distress.     Breath sounds: No rhonchi or rales.  Abdominal:     Palpations: Abdomen is soft.     Tenderness: There is abdominal tenderness. There is right CVA tenderness and left CVA tenderness. There is no guarding or rebound.     Comments: Bowel sounds present and frequent.  Bowel sounds are not hyperresonant  Skin:    General: Skin is warm.     Capillary Refill: Capillary refill takes less than 2 seconds.  Neurological:     General: No focal deficit present.     Mental Status: She is alert and oriented to person, place, and time.      UC Treatments / Results  Labs (all labs ordered are listed, but only abnormal results are displayed) Labs Reviewed  CBC WITH DIFFERENTIAL/PLATELET - Abnormal; Notable for the following components:      Result  Value   WBC 22.6 (*)    Neutro Abs 19.5 (*)    Monocytes Absolute 1.1 (*)    Abs Immature Granulocytes 0.17 (*)    All other components within normal limits  POCT URINALYSIS DIP (DEVICE) - Abnormal; Notable for the following components:   Glucose, UA 100 (*)    Bilirubin Urine LARGE (*)    Ketones, ur 15 (*)    Hgb urine dipstick SMALL (*)    Protein, ur >=300 (*)    Nitrite POSITIVE (*)    All other components within normal limits  URINE CULTURE  BASIC METABOLIC PANEL    EKG   Radiology Dg Abd 1 View  Result Date: 10/16/2018 CLINICAL DATA:  Abdominal pain EXAM: ABDOMEN - 1 VIEW COMPARISON:  None. FINDINGS: The bowel gas pattern is normal. No radio-opaque calculi or other significant radiographic abnormality are seen. IMPRESSION: Negative. Electronically Signed   By: Constance Holster M.D.   On: 10/16/2018 17:34    Procedures Procedures (including critical care time)  Medications Ordered in UC Medications  cefTRIAXone (ROCEPHIN) injection 1 g (1 g Intramuscular Given 10/16/18 1814)  cefTRIAXone (ROCEPHIN) 1 g injection (has no administration in time range)  lidocaine (PF) (XYLOCAINE) 1 % injection (has no administration in time range)    Initial Impression / Assessment and Plan / UC Course  I have reviewed the triage vital signs and the nursing notes.  Pertinent labs &  imaging results that were available during my care of the patient were reviewed by me and considered in my medical decision making (see chart for details).     1.  Abdominal pain with fever (acute pyelonephritis) KUB is negative for intestinal obstruction or constipation CBC, BMP Point-of-care urinalysis is positive for blood and nitrites as well as protein. Ceftriaxone 1 g IM Levaquin 750 mg orally daily x7 days Zofran ODT 4 mg as needed for nausea vomiting. Final Clinical Impressions(s) / UC Diagnoses   Final diagnoses:  Acute pyelonephritis   Discharge Instructions   None    ED Prescriptions     Medication Sig Dispense Auth. Provider   levofloxacin (LEVAQUIN) 750 MG tablet Take 1 tablet (750 mg total) by mouth daily for 7 days. 7 tablet Quavis Klutz, Myrene Galas, MD   ondansetron (ZOFRAN ODT) 4 MG disintegrating tablet Take 1 tablet (4 mg total) by mouth every 8 (eight) hours as needed for nausea or vomiting. 20 tablet Sayed Apostol, Myrene Galas, MD     Controlled Substance Prescriptions [place] Controlled Substance Registry consulted? No   Chase Picket, MD 10/16/18 1820    Chase Picket, MD 10/16/18 Vernelle [place]

## 2018-10-16 NOTE — ED Triage Notes (Signed)
Pt states she maybe constipated. Pt states she has been trying to go for 3 days. Pt cc abdominal pain and cramping from trying to have a bowel movement.

## 2018-10-17 LAB — URINE CULTURE: Culture: <10000 — AB

## 2018-10-17 MED FILL — levoFLOXacin 750 MG TABS: 750 | 7 days supply | Qty: 7 | Fill #0

## 2018-10-17 MED FILL — ONDANSETRON ODT 4 MG TABLET: 4 | 6 days supply | Qty: 20 | Fill #0

## 2018-10-20 ENCOUNTER — Emergency Department (HOSPITAL_BASED_OUTPATIENT_CLINIC_OR_DEPARTMENT_OTHER): Payer: 59

## 2018-10-20 ENCOUNTER — Inpatient Hospital Stay (HOSPITAL_BASED_OUTPATIENT_CLINIC_OR_DEPARTMENT_OTHER)
Admission: EM | Admit: 2018-10-20 | Discharge: 2018-10-30 | DRG: 392 | Disposition: A | Discharge status: Home / Self Care | Payer: 59 | Attending: Internal Medicine | Admitting: Internal Medicine

## 2018-10-20 ENCOUNTER — Telehealth (HOSPITAL_COMMUNITY): Payer: Self-pay | Admitting: Emergency Medicine

## 2018-10-20 ENCOUNTER — Other Ambulatory Visit: Payer: Self-pay

## 2018-10-20 ENCOUNTER — Encounter (HOSPITAL_BASED_OUTPATIENT_CLINIC_OR_DEPARTMENT_OTHER): Payer: Self-pay | Admitting: *Deleted

## 2018-10-20 DIAGNOSIS — E876 Hypokalemia: Secondary | ICD-10-CM | POA: Diagnosis not present

## 2018-10-20 DIAGNOSIS — R7302 Impaired glucose tolerance (oral): Secondary | ICD-10-CM | POA: Diagnosis not present

## 2018-10-20 DIAGNOSIS — N12 Tubulo-interstitial nephritis, not specified as acute or chronic: Secondary | ICD-10-CM | POA: Diagnosis present

## 2018-10-20 DIAGNOSIS — B373 Candidiasis of vulva and vagina: Secondary | ICD-10-CM | POA: Diagnosis not present

## 2018-10-20 DIAGNOSIS — N390 Urinary tract infection, site not specified: Secondary | ICD-10-CM | POA: Diagnosis not present

## 2018-10-20 DIAGNOSIS — F32A Depression, unspecified: Secondary | ICD-10-CM | POA: Diagnosis present

## 2018-10-20 DIAGNOSIS — Z823 Family history of stroke: Secondary | ICD-10-CM

## 2018-10-20 DIAGNOSIS — E119 Type 2 diabetes mellitus without complications: Secondary | ICD-10-CM | POA: Diagnosis present

## 2018-10-20 DIAGNOSIS — K5720 Diverticulitis of large intestine with perforation and abscess without bleeding: Secondary | ICD-10-CM | POA: Diagnosis not present

## 2018-10-20 DIAGNOSIS — Z86010 Personal history of colonic polyps: Secondary | ICD-10-CM | POA: Insufficient documentation

## 2018-10-20 DIAGNOSIS — Z6826 Body mass index (BMI) 26.0-26.9, adult: Secondary | ICD-10-CM

## 2018-10-20 DIAGNOSIS — E782 Mixed hyperlipidemia: Secondary | ICD-10-CM | POA: Diagnosis not present

## 2018-10-20 DIAGNOSIS — I440 Atrioventricular block, first degree: Secondary | ICD-10-CM | POA: Diagnosis not present

## 2018-10-20 DIAGNOSIS — Z87898 Personal history of other specified conditions: Secondary | ICD-10-CM | POA: Insufficient documentation

## 2018-10-20 DIAGNOSIS — Z87440 Personal history of urinary (tract) infections: Secondary | ICD-10-CM | POA: Insufficient documentation

## 2018-10-20 DIAGNOSIS — E663 Overweight: Secondary | ICD-10-CM | POA: Diagnosis present

## 2018-10-20 DIAGNOSIS — F419 Anxiety disorder, unspecified: Secondary | ICD-10-CM | POA: Diagnosis present

## 2018-10-20 DIAGNOSIS — N289 Disorder of kidney and ureter, unspecified: Secondary | ICD-10-CM | POA: Diagnosis not present

## 2018-10-20 DIAGNOSIS — R17 Unspecified jaundice: Secondary | ICD-10-CM | POA: Diagnosis not present

## 2018-10-20 DIAGNOSIS — Z7982 Long term (current) use of aspirin: Secondary | ICD-10-CM

## 2018-10-20 DIAGNOSIS — Z720 Tobacco use: Secondary | ICD-10-CM | POA: Diagnosis present

## 2018-10-20 DIAGNOSIS — Z20828 Contact with and (suspected) exposure to other viral communicable diseases: Secondary | ICD-10-CM | POA: Diagnosis not present

## 2018-10-20 DIAGNOSIS — I252 Old myocardial infarction: Secondary | ICD-10-CM | POA: Diagnosis not present

## 2018-10-20 DIAGNOSIS — E872 Acidosis: Secondary | ICD-10-CM | POA: Diagnosis not present

## 2018-10-20 DIAGNOSIS — I10 Essential (primary) hypertension: Secondary | ICD-10-CM | POA: Diagnosis not present

## 2018-10-20 DIAGNOSIS — Z882 Allergy status to sulfonamides status: Secondary | ICD-10-CM

## 2018-10-20 DIAGNOSIS — D649 Anemia, unspecified: Secondary | ICD-10-CM | POA: Diagnosis present

## 2018-10-20 DIAGNOSIS — K5732 Diverticulitis of large intestine without perforation or abscess without bleeding: Secondary | ICD-10-CM | POA: Diagnosis not present

## 2018-10-20 DIAGNOSIS — Z811 Family history of alcohol abuse and dependence: Secondary | ICD-10-CM

## 2018-10-20 DIAGNOSIS — N949 Unspecified condition associated with female genital organs and menstrual cycle: Secondary | ICD-10-CM | POA: Diagnosis not present

## 2018-10-20 DIAGNOSIS — Z8249 Family history of ischemic heart disease and other diseases of the circulatory system: Secondary | ICD-10-CM

## 2018-10-20 DIAGNOSIS — Z87891 Personal history of nicotine dependence: Secondary | ICD-10-CM | POA: Diagnosis not present

## 2018-10-20 DIAGNOSIS — K651 Peritoneal abscess: Secondary | ICD-10-CM | POA: Diagnosis not present

## 2018-10-20 DIAGNOSIS — I708 Atherosclerosis of other arteries: Secondary | ICD-10-CM | POA: Diagnosis present

## 2018-10-20 DIAGNOSIS — R1032 Left lower quadrant pain: Secondary | ICD-10-CM | POA: Diagnosis not present

## 2018-10-20 DIAGNOSIS — K581 Irritable bowel syndrome with constipation: Secondary | ICD-10-CM | POA: Diagnosis present

## 2018-10-20 DIAGNOSIS — F329 Major depressive disorder, single episode, unspecified: Secondary | ICD-10-CM | POA: Diagnosis present

## 2018-10-20 DIAGNOSIS — E785 Hyperlipidemia, unspecified: Secondary | ICD-10-CM | POA: Diagnosis present

## 2018-10-20 DIAGNOSIS — K5792 Diverticulitis of intestine, part unspecified, without perforation or abscess without bleeding: Secondary | ICD-10-CM | POA: Diagnosis not present

## 2018-10-20 LAB — CBC WITH DIFFERENTIAL/PLATELET
Abs Immature Granulocytes: 0.5 10*3/uL — ABNORMAL HIGH (ref 0.00–0.07)
Basophils Absolute: 0.1 10*3/uL (ref 0.0–0.1)
Basophils Relative: 0 %
Eosinophils Absolute: 0.2 10*3/uL (ref 0.0–0.5)
Eosinophils Relative: 1 %
HCT: 38.9 % (ref 36.0–46.0)
Hemoglobin: 12.8 g/dL (ref 12.0–15.0)
Immature Granulocytes: 2 %
Lymphocytes Relative: 16 %
Lymphs Abs: 3.4 10*3/uL (ref 0.7–4.0)
MCH: 30.6 pg (ref 26.0–34.0)
MCHC: 32.9 g/dL (ref 30.0–36.0)
MCV: 93.1 fL (ref 80.0–100.0)
Monocytes Absolute: 1.4 10*3/uL — ABNORMAL HIGH (ref 0.1–1.0)
Monocytes Relative: 7 %
Neutro Abs: 15.3 10*3/uL — ABNORMAL HIGH (ref 1.7–7.7)
Neutrophils Relative %: 74 %
Platelets: 366 10*3/uL (ref 150–400)
RBC: 4.18 MIL/uL (ref 3.87–5.11)
RDW: 13.4 % (ref 11.5–15.5)
WBC: 20.8 10*3/uL — ABNORMAL HIGH (ref 4.0–10.5)
nRBC: 0 % (ref 0.0–0.2)

## 2018-10-20 LAB — COMPREHENSIVE METABOLIC PANEL
ALT: 92 U/L — ABNORMAL HIGH (ref 0–44)
AST: 67 U/L — ABNORMAL HIGH (ref 15–41)
Albumin: 3.5 g/dL (ref 3.5–5.0)
Alkaline Phosphatase: 164 U/L — ABNORMAL HIGH (ref 38–126)
Anion gap: 12 (ref 5–15)
BUN: 13 mg/dL (ref 6–20)
CO2: 22 mmol/L (ref 22–32)
Calcium: 9.1 mg/dL (ref 8.9–10.3)
Chloride: 104 mmol/L (ref 98–111)
Creatinine, Ser: 0.94 mg/dL (ref 0.44–1.00)
GFR calc Af Amer: >60 mL/min (ref >60)
GFR calc non Af Amer: >60 mL/min (ref >60)
Glucose, Bld: 108 mg/dL — ABNORMAL HIGH (ref 70–99)
Potassium: 3 mmol/L — ABNORMAL LOW (ref 3.5–5.1)
Sodium: 138 mmol/L (ref 135–145)
Total Bilirubin: 0.9 mg/dL (ref 0.3–1.2)
Total Protein: 7.9 g/dL (ref 6.5–8.1)

## 2018-10-20 LAB — URINALYSIS, MICROSCOPIC (REFLEX)

## 2018-10-20 LAB — URINALYSIS, ROUTINE W REFLEX MICROSCOPIC
Glucose, UA: NEGATIVE mg/dL
Ketones, ur: NEGATIVE mg/dL
Leukocytes,Ua: NEGATIVE
Nitrite: NEGATIVE
Protein, ur: 100 mg/dL — AB
Specific Gravity, Urine: >1.03 — ABNORMAL HIGH (ref 1.005–1.030)
pH: 5.5 (ref 5.0–8.0)

## 2018-10-20 LAB — SARS CORONAVIRUS 2 BY RT PCR (HOSPITAL ORDER, PERFORMED IN ~~LOC~~ HOSPITAL LAB): SARS Coronavirus 2: NEGATIVE

## 2018-10-20 LAB — PROTIME-INR
INR: 1.1 (ref 0.8–1.2)
Prothrombin Time: 14.5 seconds (ref 11.4–15.2)

## 2018-10-20 MED ORDER — ACETAMINOPHEN 650 MG RE SUPP: 650.0000 mg | Freq: Four times a day (QID) | RECTAL | Status: DC | PRN

## 2018-10-20 MED ORDER — ALBUTEROL SULFATE HFA 108 (90 BASE) MCG/ACT IN AERS: 2.0000 | INHALATION_SPRAY | Freq: Four times a day (QID) | RESPIRATORY_TRACT | Status: DC | PRN

## 2018-10-20 MED ORDER — PROCHLORPERAZINE EDISYLATE 10 MG/2ML IJ SOLN: 5.0000 mg | INTRAMUSCULAR | Status: DC | PRN

## 2018-10-20 MED ORDER — SODIUM CHLORIDE 0.9 % IV SOLN
8.0000 mg | Freq: Four times a day (QID) | INTRAVENOUS | Status: DC | PRN
  Filled 2018-10-20: qty 4

## 2018-10-20 MED ORDER — ACETAMINOPHEN 325 MG PO TABS: 650.0000 mg | ORAL_TABLET | Freq: Four times a day (QID) | ORAL | Status: DC | PRN

## 2018-10-20 MED ORDER — ONDANSETRON HCL 4 MG/2ML IJ SOLN: 4.0000 mg | Freq: Four times a day (QID) | INTRAMUSCULAR | Status: DC | PRN

## 2018-10-20 MED ORDER — HYDROMORPHONE HCL 1 MG/ML IJ SOLN: 0.5000 mg | INTRAMUSCULAR | Status: DC | PRN

## 2018-10-20 MED ORDER — IOHEXOL 300 MG/ML  SOLN
100.0000 mL | Freq: Once | INTRAMUSCULAR | Status: AC | PRN
  Administered 2018-10-20: 100 mL via INTRAVENOUS

## 2018-10-20 MED ORDER — POTASSIUM CHLORIDE IN NACL 40-0.9 MEQ/L-% IV SOLN
INTRAVENOUS | Status: AC
  Administered 2018-10-20: 100 mL/h via INTRAVENOUS
  Filled 2018-10-20 (×2): qty 1000

## 2018-10-20 MED ORDER — ALBUTEROL SULFATE (2.5 MG/3ML) 0.083% IN NEBU: 2.5000 mg | INHALATION_SOLUTION | Freq: Four times a day (QID) | RESPIRATORY_TRACT | Status: DC | PRN

## 2018-10-20 MED ORDER — LACTATED RINGERS IV BOLUS: 1000.0000 mL | Freq: Three times a day (TID) | INTRAVENOUS | Status: AC | PRN

## 2018-10-20 MED ORDER — PIPERACILLIN-TAZOBACTAM 3.375 G IVPB 30 MIN
3.3750 g | Freq: Once | INTRAVENOUS | Status: DC
  Administered 2018-10-20: 3.375 g via INTRAVENOUS
  Filled 2018-10-20 (×2): qty 50

## 2018-10-20 MED ORDER — LACTATED RINGERS IV BOLUS
1000.0000 mL | Freq: Once | INTRAVENOUS | Status: AC
  Administered 2018-10-20: 1000 mL via INTRAVENOUS

## 2018-10-20 MED ORDER — SODIUM CHLORIDE 0.9 % IV SOLN
INTRAVENOUS | Status: DC | PRN
  Administered 2018-10-20: 250 mL via INTRAVENOUS

## 2018-10-20 MED ORDER — METHOCARBAMOL 1000 MG/10ML IJ SOLN
1000.0000 mg | Freq: Four times a day (QID) | INTRAVENOUS | Status: DC | PRN
  Filled 2018-10-20: qty 10

## 2018-10-20 MED ORDER — PIPERACILLIN-TAZOBACTAM 3.375 G IVPB
3.3750 g | Freq: Three times a day (TID) | INTRAVENOUS | Status: DC
  Administered 2018-10-21 – 2018-10-30 (×29): 3.375 g via INTRAVENOUS
  Filled 2018-10-20 (×28): qty 50

## 2018-10-20 NOTE — H&P (Signed)
History and Physical    Kaitlyn Harper [id number] DOB: March 18, 1960 DOA: 10/20/2018  PCP: Debbrah Alar, NP   Patient coming from: Home   Chief Complaint: abdominal pain, fevers, nausea   HPI: Kaitlyn Harper is a 58 y.o. female with medical history significant for hypertension, diet-controlled diabetes mellitus, hyperlipidemia, depression, and anxiety, now presenting to the emergency department for evaluation of abdominal pain.  Patient developed lower abdominal discomfort, fevers, chills, and nausea without vomiting approximately 1 week ago, was suspected to have UTI, was given a dose of Rocephin and started on Levaquin on 10/16/2018, continued to feel poorly with ongoing lower abdominal pain, was noted to have leukocytosis greater than 20,000 on outpatient blood work, and was directed to the ED for further evaluation of this.  She has had some loose stools without any melena or hematochezia, has nausea and loss of appetite, but no vomiting.  She had never experienced these symptoms previously.  Denies cough or shortness of breath.  [facility] ED Course: Upon arrival to the ED, patient is found to be afebrile, saturating well on room air, slightly tachycardic, and with stable blood pressure.  Chemistry panel is notable for potassium of 3.0 and mild elevation in transaminases.  CBC features a leukocytosis to 20,800.  CT of the abdomen and pelvis is concerning for acute sigmoid diverticulitis with perforation and multilocular pericolonic abscess.  COVID-19 is negative. Patient was given a liter of lactated Ringer's, Zofran, Dilaudid, and Zosyn in the emergency department.  Surgery was consulted by the ED physician and recommended medical admission.  Review of Systems:  All other systems reviewed and apart from HPI, are negative.  Past Medical History:  Diagnosis Date  . Anemia   . Anxiety   . Depression   . Diabetes mellitus without complication ([place])    type 2-diet  controlled  . Hyperglycemia   . Hyperlipidemia   . Hypertension   . Syncope 03/18/2016    Past Surgical History:  Procedure Laterality Date  . APPENDECTOMY  1985  . CHOLECYSTECTOMY  1985  . COLONOSCOPY W/ POLYPECTOMY  03/01/2015   Dr Oretha Caprice, [facility].  +adenomatous polyps  . COLONOSCOPY W/ POLYPECTOMY  02/28/2011   4 polyps - 3 TA  . DILATION AND CURETTAGE OF UTERUS  2005  . [place]  2002  . KNEE SURGERY  1983   arthroscopy?  [name] LEFT HEART CATH AND CORONARY ANGIOGRAPHY N/A 03/19/2016   Procedure: Left Heart Cath and Coronary Angiography;  Surgeon: Peter M Martinique, MD;  Location: [place] [facility];  Service: Cardiovascular;  Laterality: N/A;  . MOUTH SURGERY  03/08/2016  . POLYPECTOMY  2006   ?anal polyp?  Kathee Polite DUCT PROBING  2004  . TUBAL LIGATION  1985     reports that she quit smoking about 3 years ago. Her smoking use included cigarettes. She smoked 0.25 packs per day. She has never used smokeless tobacco. She reports current alcohol use. She reports that she does not use drugs.  Allergies  Allergen Reactions  . Sulfonamide Derivatives Hives and Other (See Comments)    Light sensitivity    Family History  Problem Relation Age of Onset  . Hypertension Father   . Sarcoidosis Brother   . Alcohol abuse Brother   . Cirrhosis Brother   . Stroke Maternal Grandmother   . Colon cancer Neg Hx   . Colon polyps Neg Hx   . Esophageal cancer Neg Hx   . Rectal cancer  Neg Hx   . Stomach cancer Neg Hx   . Pulmonary embolism Neg Hx      Prior to Admission medications   Medication Sig Start Date End Date Taking? Authorizing Provider  albuterol (PROVENTIL HFA;VENTOLIN HFA) 108 (90 Base) MCG/ACT inhaler Inhale 2 puffs into the lungs every 6 (six) hours as needed for wheezing or shortness of breath. 03/14/17   Saguier, Percell Miller, PA-C  amLODipine-valsartan (EXFORGE) 10-320 MG tablet TAKE 1 TABLET BY MOUTH ONCE DAILY 09/18/18   Debbrah Alar, NP  ARIPiprazole  (ABILIFY) 10 MG tablet  03/01/16   [provider]  aspirin EC 81 MG tablet Take 81 mg by mouth daily.    [provider]  atorvastatin (LIPITOR) 20 MG tablet TAKE 1 TABLET BY MOUTH ONCE DAILY 08/06/18   Debbrah Alar, NP  carvedilol (COREG) 6.25 MG tablet TAKE 1 TABLET BY MOUTH TWICE DAILY WITH A MEAL 09/18/18   Debbrah Alar, NP  lamoTRIgine (LAMICTAL) 100 MG tablet Take 100 mg by mouth 2 (two) times daily.  07/21/15   [provider]  levofloxacin (LEVAQUIN) 750 MG tablet Take 1 tablet (750 mg total) by mouth daily for 5 days. 10/16/18 10/21/18  LampteyMyrene Galas, MD  nystatin ointment (MYCOSTATIN) Apply 1 application topically 2 (two) times daily.    [provider]  ondansetron (ZOFRAN ODT) 4 MG disintegrating tablet Take 1 tablet (4 mg total) by mouth every 8 (eight) hours as needed for nausea or vomiting. 10/16/18   Lamptey, Myrene Galas, MD  potassium chloride 20 MEQ/15ML (10%) SOLN Take 15 mLs (20 mEq total) daily by mouth. 12/17/16   Debbrah Alar, NP  traZODone (DESYREL) 50 MG tablet  02/21/16   [provider]  triamcinolone ointment (KENALOG) 0.1 % Apply 1 application topically 2 (two) times daily.    [provider]  amLODipine-valsartan (EXFORGE) 10-320 MG tablet TAKE 1 TABLET DAILY BY MOUTH. 03/18/18   Debbrah Alar, NP  carvedilol (COREG) 6.25 MG tablet TAKE 1 TABLET BY MOUTH TWICE DAILY WITH A MEAL 03/10/18   Debbrah Alar, NP    Physical Exam: Vitals:   10/20/18 1621 10/20/18 1847 10/20/18 2105 10/20/18 2258  BP: (!) 172/98 (!) 147/78 131/80 (!) 148/73  Pulse: (!) 114 88 84 89  Resp: 14 16 17 17   Temp: 99.7 F (37.6 C) 99.3 F (37.4 C)  98.9 F (37.2 C)  TempSrc: Oral Oral  Oral  SpO2: 100% 100% 98% 98%  Weight: 70 kg     Height: 5\' 4"  (1.626 m)       Constitutional: NAD, calm  Eyes: PERTLA, lids and conjunctivae normal ENMT: Mucous membranes are moist. Posterior pharynx clear of any exudate or lesions.    Neck: normal, supple, no masses, no thyromegaly Respiratory: clear to auscultation bilaterally, no wheezing, no crackles. No accessory muscle use.  Cardiovascular: S1 & S2 heard, regular rate and rhythm. No extremity edema.   Abdomen: No distension, soft, tender in suprapubic region and LLQ. Bowel sounds appreciated.  Musculoskeletal: no clubbing / cyanosis. No joint deformity upper and lower extremities.   Skin: no significant rashes, lesions, ulcers. Warm, dry, well-perfused. Neurologic: No facial asymmetry. Sensation intact. Moving all extremities.  Psychiatric: Alert and oriented x 3. Very pleasant, cooperative.    Labs on Admission: I have personally reviewed following labs and imaging studies  CBC: Recent Labs  Lab 10/16/18 1731 10/20/18 1942  WBC 22.6* 20.8*  NEUTROABS 19.5* 15.3*  HGB 14.2 12.8  HCT 41.1 38.9  MCV 92.8  93.1  PLT 254 [id number]   Basic Metabolic Panel: Recent Labs  Lab 10/16/18 1731 10/20/18 1942  NA 138 138  K 3.3* 3.0*  CL 103 104  CO2 22 22  GLUCOSE 110* 108*  BUN 10 13  CREATININE 1.10* 0.94  CALCIUM 9.2 9.1   GFR: Estimated Creatinine Clearance: 61.1 mL/min (by C-G formula based on SCr of 0.94 mg/dL). Liver Function Tests: Recent Labs  Lab 10/20/18 1942  AST 67*  ALT 92*  ALKPHOS 164*  BILITOT 0.9  PROT 7.9  ALBUMIN 3.5   No results for input(s): LIPASE, AMYLASE in the last 168 hours. No results for input(s): AMMONIA in the last 168 hours. Coagulation Profile: Recent Labs  Lab 10/20/18 2138  INR 1.1   Cardiac Enzymes: No results for input(s): CKTOTAL, CKMB, CKMBINDEX, TROPONINI in the last 168 hours. BNP (last 3 results) No results for input(s): PROBNP in the last 8760 hours. HbA1C: No results for input(s): HGBA1C in the last 72 hours. CBG: No results for input(s): GLUCAP in the last 168 hours. Lipid Profile: No results for input(s): CHOL, HDL, LDLCALC, TRIG, CHOLHDL, LDLDIRECT in the last 72 hours. Thyroid Function Tests:  No results for input(s): TSH, T4TOTAL, FREET4, T3FREE, THYROIDAB in the last 72 hours. Anemia Panel: No results for input(s): VITAMINB12, FOLATE, FERRITIN, TIBC, IRON, RETICCTPCT in the last 72 hours. Urine analysis:    Component Value Date/Time   COLORURINE AMBER (A) 10/20/2018 1613   APPEARANCEUR CLEAR 10/20/2018 1613   LABSPEC >1.030 (H) 10/20/2018 1613   PHURINE 5.5 10/20/2018 1613   GLUCOSEU NEGATIVE 10/20/2018 1613   GLUCOSEU NEGATIVE 01/24/2015 1647   HGBUR SMALL (A) 10/20/2018 1613   BILIRUBINUR MODERATE (A) 10/20/2018 1613   BILIRUBINUR Negative 10/03/2016 1548   KETONESUR NEGATIVE 10/20/2018 1613   PROTEINUR 100 (A) 10/20/2018 1613   UROBILINOGEN >=8.0 10/16/2018 1738   NITRITE NEGATIVE 10/20/2018 1613   LEUKOCYTESUR NEGATIVE 10/20/2018 1613   Sepsis Labs: @LABRCNTIP (procalcitonin:4,lacticidven:4) ) Recent Results (from the past 240 hour(s))  Urine culture     Status: Abnormal   Collection Time: 10/16/18  5:58 PM   Specimen: Urine, Clean Catch  Result Value Ref Range Status   Specimen Description URINE, CLEAN CATCH  Final   Special Requests NONE  Final   Culture (A)  Final    <10,000 COLONIES/mL INSIGNIFICANT GROWTH Performed at [facility], [place] [address] [place], [place] [address]    Report Status 10/17/2018 FINAL  Final  SARS Coronavirus 2 [facility] order, Performed in [facility] hospital lab) Nasopharyngeal Nasopharyngeal Swab     Status: None   Collection Time: 10/20/18  9:51 PM   Specimen: Nasopharyngeal Swab  Result Value Ref Range Status   SARS Coronavirus 2 NEGATIVE NEGATIVE Final    Comment: (NOTE) If result is NEGATIVE SARS-CoV-2 target nucleic acids are NOT DETECTED. The SARS-CoV-2 RNA is generally detectable in upper and lower  respiratory specimens during the acute phase of infection. The lowest  concentration of SARS-CoV-2 viral copies this assay can detect is 250  copies / mL. A negative result does not preclude SARS-CoV-2 infection   and should not be used as the sole basis for treatment or other  patient management decisions.  A negative result may occur with  improper specimen collection / handling, submission of specimen other  than nasopharyngeal swab, presence of viral mutation(s) within the  areas targeted by this assay, and inadequate number of viral copies  (<250 copies / mL). A negative result must be  combined with clinical  observations, patient history, and epidemiological information. If result is POSITIVE SARS-CoV-2 target nucleic acids are DETECTED. The SARS-CoV-2 RNA is generally detectable in upper and lower  respiratory specimens dur ing the acute phase of infection.  Positive  results are indicative of active infection with SARS-CoV-2.  Clinical  correlation with patient history and other diagnostic information is  necessary to determine patient infection status.  Positive results do  not rule out bacterial infection or co-infection with other viruses. If result is PRESUMPTIVE POSTIVE SARS-CoV-2 nucleic acids MAY BE PRESENT.   A presumptive positive result was obtained on the submitted specimen  and confirmed on repeat testing.  While 2019 novel coronavirus  (SARS-CoV-2) nucleic acids may be present in the submitted sample  additional confirmatory testing may be necessary for epidemiological  and / or clinical management purposes  to differentiate between  SARS-CoV-2 and other Sarbecovirus currently known to infect humans.  If clinically indicated additional testing with an alternate test  methodology [phone]) is advised. The SARS-CoV-2 RNA is generally  detectable in upper and lower respiratory sp ecimens during the acute  phase of infection. The expected result is Negative. Fact Sheet for Patients:  StrictlyIdeas.no Fact Sheet for Healthcare Providers: BankingDealers.co.za This test is not yet approved or cleared by the [place] FDA and has  been authorized for detection and/or diagnosis of SARS-CoV-2 by FDA under an Emergency Use Authorization (EUA).  This EUA will remain in effect (meaning this test can be used) for the duration of the COVID-19 declaration under Section 564(b)(1) of the Act, 21 U.S.C. section 360bbb-3(b)(1), unless the authorization is terminated or revoked sooner. Performed at [facility], [address] [place], [place] [address]      Radiological Exams on Admission: Ct Abdomen Pelvis W Contrast  Result Date: 10/20/2018 CLINICAL DATA:  Abdominal pain EXAM: CT ABDOMEN AND PELVIS WITH CONTRAST TECHNIQUE: Multidetector CT imaging of the abdomen and pelvis was performed using the standard protocol following bolus administration of intravenous contrast. CONTRAST:  19mL OMNIPAQUE IOHEXOL 300 MG/ML  SOLN COMPARISON:  None. FINDINGS: Lower chest: The visualized heart size within normal limits. No pericardial fluid/thickening. No hiatal hernia. The visualized portions of the lungs are clear. Hepatobiliary: The liver is normal in density without focal abnormality.The main portal vein is patent. The patient is status post cholecystectomy. No biliary ductal dilation. Pancreas: Unremarkable. No pancreatic ductal dilatation or surrounding inflammatory changes. Spleen: Normal in size without focal abnormality. Adrenals/Urinary Tract: Both adrenal glands appear normal. There are bilateral low-density lesions seen within both kidneys. The largest in the upper pole of the left kidney measuring 1.5 cm, likely simple renal cysts. Stomach/Bowel: The stomach is normal in appearance. There is a duodenal diverticulum with retained food stuff seen within the third portion of the duodenum. The remainder of the small bowel is unremarkable.Surgical clips seen in the right lower quadrant. There is a 9 cm segment of sigmoid colonic diverticulitis with diffuse bowel wall thickening and surrounding extensive mesenteric fat stranding  changes. Small amount of pneumoperitoneum is seen adjacent to the sigmoid colon. There is a multilocular collection extending around the sigmoid colon best seen on series 2, image 64, measuring approximately 7.0 x 2.7 x 3.6 cm. The collection has fluid and small foci of air and is peripherally enhancing. Vascular/Lymphatic: There are no enlarged mesenteric, retroperitoneal, or pelvic lymph nodes. No significant vascular findings are present. Reproductive: The uterus and adnexa are unremarkable. there is free fluid seen within  the cul-de-sac.1 Other: No evidence of abdominal wall mass or hernia. Musculoskeletal: No acute or significant osseous findings. IMPRESSION: Acute sigmoid colonic diverticulitis with perforation and multilocular pericolonic abscess measuring 7.0 x 2.7 x3.6 cm. These results were called by telephone at the time of interpretation on 10/20/2018 at 8:22 pm to Martinique Robinson PA, who verbally acknowledged these results. Electronically Signed   By: [name] M.D.   On: 10/20/2018 20:26    EKG: Not performed.   Assessment/Plan   1. Acute diverticulitis with perforation and abscess  - Presents with persistent lower abdominal pain, fevers, and nausea despite treatment for suspected UTI, and is found to have acute sigmoid diverticulitis with perforation and abscess  - She has been hemodynamically stable  - Surgery is consulting and much appreciated  - Continue bowel rest, Zosyn, pain-control, IVF hydration, and antiemetics    2. Hypertension  - BP at goal  - Use labetalol IVP's as needed while NPO    3. Type II DM  - Diet-controlled at home, serum glucose is 108 in ED  - Monitor CBG's and use a low-intensity SSI with Novolog if needed   4. Hypokalemia  - Serum potassium is 3.0 in ED  - KCl added to IVF, repeat chem panel in am     PPE: Mask, face shield  DVT prophylaxis: SCD's  Code Status: Full  Family Communication: Discussed with patient  Consults called: Surgery   Admission status: Inpatient. Acute diverticulitis is complicated by perforation, she is at high-risk for life-threatening complications, and will require inpatient management with specialist consultation.     Vianne Bulls, MD [facility] Pager [phone]  If 7PM-7AM, please contact night-coverage [email or web address] Password [email or web address]  10/20/2018, 11:09 PM

## 2018-10-20 NOTE — ED Triage Notes (Signed)
Pt reports being seen and treated at [facility] 4 days ago. States that she was dx with a UTI.  Reports no change in symptoms and they called her today to tell her her WBCs were elevated.

## 2018-10-20 NOTE — Telephone Encounter (Signed)
Called and spoke with patient to see how she was doing, pt states she does not feel good, reviewd results and pt symptoms with Dr. Meda Coffee, per Dr. Meda Coffee pt needs eval in ER. Pt agreeable to plan, all questions answered.

## 2018-10-20 NOTE — ED Provider Notes (Addendum)
[facility] EMERGENCY DEPARTMENT Provider Note   CSN: [id number] Arrival date & time: 10/20/18  1613     History   Chief Complaint Chief Complaint  Patient presents with   Urinary Tract Infection    HPI Kaitlyn Harper is a 58 y.o. female.     Patient is a 58 year old female with a history of diabetes, hypertension and hyperlipidemia who presents with lower abdominal pain.  She has had a week history of worsening pain across her lower abdomen.  It is mostly in her suprapubic area.  She was seen in urgent care 4 days ago and diagnosed with pyelonephritis and started on Levaquin.  She says she was called back today because her WBC count was elevated at 22,000 and she was told to come to the emergency room.  She says she has not really been getting any better since she started the antibiotic.  She still has pain across her lower abdomen with chills and low-grade fevers up to 101 when she was at [facility].  She denies any urinary symptoms.  She has a little bit of loose stools.  She has a decreased appetite but no nausea or vomiting.  She said she had some nausea earlier in the course of the illness but none currently.  No history of similar symptoms.  She has had a cholecystectomy but no other abdominal surgeries.  She is postmenopausal.  She denies any vaginal discharge or bleeding.     Past Medical History:  Diagnosis Date   Anemia    Anxiety    Depression    Diabetes mellitus without complication ([place])    type 2-diet controlled   Hyperglycemia    Hyperlipidemia    Hypertension    Syncope 03/18/2016    Patient Active Problem List   Diagnosis Date Noted   Depressive disorder 10/20/2018   Intra-abdominal abscess ([place]) 10/20/2018   Diverticulitis of large intestine with perforation and abscess 10/20/2018   History of syncope 10/20/2018   AV block, 1st degree 10/20/2018   History of adenomatous polyp of colon 10/20/2018   History of UTI (EColi 2018)  10/20/2018   Anemia    Depression    Anxiety    Urogenital infection by trichomonas vaginalis 06/13/2017   NSTEMI (non-ST elevated myocardial infarction) ([place]) 03/19/2016   Hypokalemia 03/18/2016   Tobacco abuse 06/20/2015   Atypical squamous cells of undetermined significance (ASCUS) on Papanicolaou smear of cervix 12/23/2014   Intertrigo 09/27/2014   Left shoulder pain 12/14/2013   Impaired glucose tolerance 02/12/2013   Seasonal allergies 05/23/2011   Hot flashes, menopausal 01/29/2011   General medical examination 01/29/2011   Weight gain 12/26/2010   Vitamin D deficiency 12/26/2010   Hyperlipidemia 12/26/2010   Diabetes type 2, controlled ([place]) 12/26/2010   DOE (dyspnea on exertion) 08/20/2008   Anxiety and depression 08/19/2008   Essential hypertension 08/19/2008   Irritable bowel syndrome 08/19/2008    Past Surgical History:  Procedure Laterality Date   [place]   COLONOSCOPY W/ POLYPECTOMY  03/01/2015   Dr Oretha Caprice, [facility].  +adenomatous polyps   COLONOSCOPY W/ POLYPECTOMY  02/28/2011   4 polyps - 3 TA   DILATION AND CURETTAGE OF UTERUS  2005   HEMORRHOID SURGERY  2002   KNEE SURGERY  1983   arthroscopy?   LEFT HEART CATH AND CORONARY ANGIOGRAPHY N/A 03/19/2016   Procedure: Left Heart Cath and Coronary Angiography;  Surgeon: Peter M Martinique, MD;  Location:  [facility];  Service: Cardiovascular;  Laterality: N/A;   MOUTH SURGERY  03/08/2016   POLYPECTOMY  2006   ?anal polyp?   TEAR DUCT PROBING  2004   TUBAL LIGATION  1985     OB History    Gravida  3   Para  3   Term      Preterm      AB      Living        SAB      TAB      Ectopic      Multiple      Live Births               Home Medications    Prior to Admission medications   Medication Sig Start Date End Date Taking? Authorizing Provider  albuterol (PROVENTIL HFA;VENTOLIN HFA) 108 (90 Base) MCG/ACT  inhaler Inhale 2 puffs into the lungs every 6 (six) hours as needed for wheezing or shortness of breath. 03/14/17  Yes Saguier, Percell Miller, PA-C  amLODipine-valsartan (EXFORGE) 10-320 MG tablet TAKE 1 TABLET BY MOUTH ONCE DAILY Patient taking differently: Take 1 tablet by mouth daily.  09/18/18  Yes Debbrah Alar, NP  ARIPiprazole (ABILIFY) 10 MG tablet Take 10 mg by mouth daily after breakfast.  03/01/16  Yes [provider]  aspirin EC 81 MG tablet Take 81 mg by mouth daily.   Yes [provider]  atorvastatin (LIPITOR) 20 MG tablet TAKE 1 TABLET BY MOUTH ONCE DAILY Patient taking differently: Take 20 mg by mouth daily after breakfast.  08/06/18  Yes Debbrah Alar, NP  carvedilol (COREG) 6.25 MG tablet TAKE 1 TABLET BY MOUTH TWICE DAILY WITH A MEAL Patient taking differently: Take 6.25 mg by mouth 2 (two) times daily with a meal.  09/18/18  Yes Debbrah Alar, NP  lamoTRIgine (LAMICTAL) 100 MG tablet Take 100 mg by mouth 2 (two) times daily.  07/21/15  Yes [provider]  levofloxacin (LEVAQUIN) 750 MG tablet Take 1 tablet (750 mg total) by mouth daily for 5 days. 10/16/18 10/21/18 Yes Lamptey, Myrene Galas, MD  ondansetron (ZOFRAN ODT) 4 MG disintegrating tablet Take 1 tablet (4 mg total) by mouth every 8 (eight) hours as needed for nausea or vomiting. 10/16/18  Yes Lamptey, Myrene Galas, MD  traZODone (DESYREL) 50 MG tablet Take 100 mg by mouth at bedtime.  02/21/16  Yes [provider]  triamcinolone ointment (KENALOG) 0.1 % Apply 1 application topically 2 (two) times daily as needed (rash).    Yes [provider]  amLODipine-valsartan (EXFORGE) 10-320 MG tablet TAKE 1 TABLET DAILY BY MOUTH. 03/18/18   Debbrah Alar, NP  carvedilol (COREG) 6.25 MG tablet TAKE 1 TABLET BY MOUTH TWICE DAILY WITH A MEAL 03/10/18   Debbrah Alar, NP    Family History Family History  Problem Relation Age of Onset   Hypertension Father    Sarcoidosis Brother     Alcohol abuse Brother    Cirrhosis Brother    Stroke Maternal Grandmother    Colon cancer Neg Hx    Colon polyps Neg Hx    Esophageal cancer Neg Hx    Rectal cancer Neg Hx    Stomach cancer Neg Hx    Pulmonary embolism Neg Hx     Social History Social History   Tobacco Use   Smoking status: Former Smoker    Packs/day: 0.25    Types: Cigarettes    Quit date: 07/02/2015    Years since  quitting: 3.3   Smokeless tobacco: Never Used   Tobacco comment: 10 cigarettes daily  Substance Use Topics   Alcohol use: Yes    Alcohol/week: 0.0 standard drinks    Comment: socially   Drug use: No     Allergies   Sulfonamide derivatives   Review of Systems Review of Systems  Constitutional: Positive for appetite change and chills. Negative for diaphoresis, fatigue and fever.  HENT: Negative for congestion, rhinorrhea and sneezing.   Eyes: Negative.   Respiratory: Negative for cough, chest tightness and shortness of breath.   Cardiovascular: Negative for chest pain and leg swelling.  Gastrointestinal: Positive for abdominal pain. Negative for blood in stool, diarrhea, nausea and vomiting.  Genitourinary: Negative for difficulty urinating, flank pain, frequency and hematuria.  Musculoskeletal: Negative for arthralgias and back pain.  Skin: Negative for rash.  Neurological: Negative for dizziness, speech difficulty, weakness, numbness and headaches.     Physical Exam Updated Vital Signs BP (!) 148/73 (BP Location: Left Arm)    Pulse 89    Temp 98.9 F (37.2 C) (Oral)    Resp 17    Ht 5\' 4"  (1.626 m)    Wt 70 kg    LMP 12/25/2008    SpO2 98%    BMI 26.50 kg/m   Physical Exam Constitutional:      Appearance: She is well-developed.  HENT:     Head: Normocephalic and atraumatic.  Eyes:     Pupils: Pupils are equal, round, and reactive to light.  Neck:     Musculoskeletal: Normal range of motion and neck supple.  Cardiovascular:     Rate and Rhythm: Normal rate and  regular rhythm.     Heart sounds: Normal heart sounds.  Pulmonary:     Effort: Pulmonary effort is normal. No respiratory distress.     Breath sounds: Normal breath sounds. No wheezing or rales.  Chest:     Chest wall: No tenderness.  Abdominal:     General: Bowel sounds are normal.     Palpations: Abdomen is soft.     Tenderness: There is abdominal tenderness (Tenderness to the suprapubic area and left lower quadrant). There is no guarding or rebound.  Musculoskeletal: Normal range of motion.  Lymphadenopathy:     Cervical: No cervical adenopathy.  Skin:    General: Skin is warm and dry.     Findings: No rash.  Neurological:     Mental Status: She is alert and oriented to person, place, and time.      ED Treatments / Results  Labs (all labs ordered are listed, but only abnormal results are displayed) Labs Reviewed  URINALYSIS, ROUTINE W REFLEX MICROSCOPIC - Abnormal; Notable for the following components:      Result Value   Color, Urine AMBER (*)    Specific Gravity, Urine >1.030 (*)    Hgb urine dipstick SMALL (*)    Bilirubin Urine MODERATE (*)    Protein, ur 100 (*)    All other components within normal limits  URINALYSIS, MICROSCOPIC (REFLEX) - Abnormal; Notable for the following components:   Bacteria, UA FEW (*)    All other components within normal limits  COMPREHENSIVE METABOLIC PANEL - Abnormal; Notable for the following components:   Potassium 3.0 (*)    Glucose, Bld 108 (*)    AST 67 (*)    ALT 92 (*)    Alkaline Phosphatase 164 (*)    All other components within normal limits  CBC WITH DIFFERENTIAL/PLATELET -  Abnormal; Notable for the following components:   WBC 20.8 (*)    Neutro Abs 15.3 (*)    Monocytes Absolute 1.4 (*)    Abs Immature Granulocytes 0.50 (*)    All other components within normal limits  SARS CORONAVIRUS 2 (HOSPITAL ORDER, [facility])  PROTIME-INR  HIV ANTIBODY (ROUTINE TESTING W REFLEX)  COMPREHENSIVE  METABOLIC PANEL  CBC WITH DIFFERENTIAL/PLATELET  MAGNESIUM    EKG None  Radiology Ct Abdomen Pelvis W Contrast  Result Date: 10/20/2018 CLINICAL DATA:  Abdominal pain EXAM: CT ABDOMEN AND PELVIS WITH CONTRAST TECHNIQUE: Multidetector CT imaging of the abdomen and pelvis was performed using the standard protocol following bolus administration of intravenous contrast. CONTRAST:  169mL OMNIPAQUE IOHEXOL 300 MG/ML  SOLN COMPARISON:  None. FINDINGS: Lower chest: The visualized heart size within normal limits. No pericardial fluid/thickening. No hiatal hernia. The visualized portions of the lungs are clear. Hepatobiliary: The liver is normal in density without focal abnormality.The main portal vein is patent. The patient is status post cholecystectomy. No biliary ductal dilation. Pancreas: Unremarkable. No pancreatic ductal dilatation or surrounding inflammatory changes. Spleen: Normal in size without focal abnormality. Adrenals/Urinary Tract: Both adrenal glands appear normal. There are bilateral low-density lesions seen within both kidneys. The largest in the upper pole of the left kidney measuring 1.5 cm, likely simple renal cysts. Stomach/Bowel: The stomach is normal in appearance. There is a duodenal diverticulum with retained food stuff seen within the third portion of the duodenum. The remainder of the small bowel is unremarkable.Surgical clips seen in the right lower quadrant. There is a 9 cm segment of sigmoid colonic diverticulitis with diffuse bowel wall thickening and surrounding extensive mesenteric fat stranding changes. Small amount of pneumoperitoneum is seen adjacent to the sigmoid colon. There is a multilocular collection extending around the sigmoid colon best seen on series 2, image 64, measuring approximately 7.0 x 2.7 x 3.6 cm. The collection has fluid and small foci of air and is peripherally enhancing. Vascular/Lymphatic: There are no enlarged mesenteric, retroperitoneal, or pelvic lymph  nodes. No significant vascular findings are present. Reproductive: The uterus and adnexa are unremarkable. there is free fluid seen within the cul-de-sac.1 Other: No evidence of abdominal wall mass or hernia. Musculoskeletal: No acute or significant osseous findings. IMPRESSION: Acute sigmoid colonic diverticulitis with perforation and multilocular pericolonic abscess measuring 7.0 x 2.7 x3.6 cm. These results were called by telephone at the time of interpretation on 10/20/2018 at 8:22 pm to Martin[place] Robinson PA, who verbally acknowledged these results. Electronically Signed   By: [name] M.D.   On: 10/20/2018 20:26    Procedures Procedures (including critical care time)  Medications Ordered in ED Medications  0.9 %  sodium chloride infusion (250 mLs Intravenous New Bag/Given 10/20/18 2104)  piperacillin-tazobactam (ZOSYN) IVPB 3.375 g (3.375 g Intravenous Not Given 10/20/18 2122)  lactated ringers bolus 1,000 mL (has no administration in time range)  methocarbamol (ROBAXIN) 1,000 mg in dextrose 5 % 50 mL IVPB (has no administration in time range)  ondansetron (ZOFRAN) injection 4 mg (has no administration in time range)    Or  ondansetron (ZOFRAN) 8 mg in sodium chloride 0.9 % 50 mL IVPB (has no administration in time range)  prochlorperazine (COMPAZINE) injection 5-10 mg (has no administration in time range)  0.9 % NaCl with KCl 40 mEq / L  infusion (100 mL/hr Intravenous New Bag/Given 10/20/18 2342)  HYDROmorphone (DILAUDID) injection 0.5-1 mg (has no administration in time range)  acetaminophen (TYLENOL) tablet 650 mg (has no administration in time range)    Or  acetaminophen (TYLENOL) suppository 650 mg (has no administration in time range)  albuterol (PROVENTIL) (2.5 MG/3ML) 0.083% nebulizer solution 2.5 mg (has no administration in time range)  labetalol (NORMODYNE) injection 10 mg (has no administration in time range)  iohexol (OMNIPAQUE) 300 MG/ML solution 100 mL (100 mLs Intravenous  Contrast Given 10/20/18 2005)  lactated ringers bolus 1,000 mL (1,000 mLs Intravenous New Bag/Given 10/20/18 2151)     Initial Impression / Assessment and Plan / ED Course  I have reviewed the triage vital signs and the nursing notes.  Pertinent labs & imaging results that were available during my care of the patient were reviewed by me and considered in my medical decision making (see chart for details).  Clinical Course as of Oct 20 5  Mon Oct 20, 2018  1906 Specific Gravity, Urine(!): >1.030 [JR]  2021 Per radiologist, pt with Perforated diverticulitis with sigmoid colonic abscess   [JR]    Clinical Course User Index [JR] Robinson, [place] N, PA-C       Patient's blood work shows an elevated WBC count.  CT scan shows evidence of diverticulitis with perforation and adjacent 7 cm abscess.  I spoke with Dr. gross with general surgery who recommends hospitalist admission.  Patient was given IV Zosyn.  I spoke with Dr. Myna Hidalgo with the hospitalist service who has accepted the patient for admission.  Final Clinical Impressions(s) / ED Diagnoses   Final diagnoses:  Diverticulitis of large intestine with abscess without bleeding    ED Discharge Orders    None       Malvin Johns, MD 10/21/18 0008    Malvin Johns, MD 10/21/18 0008

## 2018-10-20 NOTE — Consult Note (Signed)
Kaitlyn Harper  Oct 10, 1960 [id number]  CARE TEAM:  PCP: Debbrah Alar, NP  Outpatient Care Team: Patient Care Team: Debbrah Alar, NP as PCP - General (Internal Medicine) Leo Grosser Seymour Bars, MD as Consulting Physician (Obstetrics and Gynecology) Milus Banister, MD as Consulting Physician (Gastroenterology) Martinique, Peter M, MD as Consulting Physician (Cardiology) Minus Breeding, MD as Consulting Physician (Cardiology)  Inpatient Treatment Team: Treatment Team: Attending Provider: Malvin Johns, MD; Registered Nurse: Ananias Pilgrim, RN; Consulting Physician: Edison Pace, Md, MD   This patient is a 58 y.o.female who presents today for surgical evaluation at the request of Dr Tamera Punt, ED MD, [place].   Chief complaint / Reason for evaluation: Worsening urinary tract infection with abdominal pain.  Pelvic abscess.  Rule out diverticulitis  Woman with a history of adenomatous colon polyps and followed by [facility] gastroenterology.  Colonoscopies 2013 2017.  5 yr f/u = 2022 rec'd.  History of appendectomy and cholecystectomy.  Apparently has abnormal Pap smear followed by [facility].  Was having urinary discomfort and lower abdominal complaints for several days.  She went to the emergency room 4 days ago.  UTI diagnosed and placed on antibiotics.  Did not improve.  Comes in Labor Day night with worsening pain now persistent for a week.  Leukocytosis even worse.  Abdominal pain concerning.  CT scan shows tubular 7 x 3 cm abscess in pelvis near rectosigmoid colon with inflammation.  Small dot of free air.  Suspicious for diverticulitis.  Interestingly, no evidence of diverticulosis on prior colonoscopies nor on prior pictures.  She does have a history of dyspnea on exertion and had a syncopal event with elevated troponins.  Seen by cardiology.  Apparently the catheterization revealed normal coronaries.  Seen by Janie Morning and Peter Martinique with  medical group  cardiology.  History of hypertension and borderline diabetes.  Hyperlipidemia.  No personal nor family history of GI/colon cancer, inflammatory bowel disease, irritable bowel syndrome, allergy such as Celiac Sprue, dietary/dairy problems, colitis, ulcers nor gastritis.  No recent sick contacts/gastroenteritis.  No travel outside the country.  No changes in diet.  No dysphagia to solids or liquids.  No significant heartburn or reflux.  No hematochezia, hematemesis, coffee ground emesis.  No evidence of prior gastric/peptic ulceration.  Chronic constipation.  Usually moves her bowels about twice a week   Assessment  Candee A Garry  58 y.o. female       Problem List:  Principal Problem:   Diverticulitis of large intestine with perforation and abscess Active Problems:   Intra-abdominal abscess ([place])   Essential hypertension   Irritable bowel syndrome   DOE (dyspnea on exertion)   Hyperlipidemia   Diabetes type 2, controlled (HCC)   Tobacco abuse   Hypokalemia   Impaired glucose tolerance   Depressive disorder   Anemia   Anxiety   History of syncope   AV block, 1st degree   History of adenomatous polyp of colon   History of UTI [name] 2018)   Pelvic abscess & inflammation  Plan:  Medicine admission for evaluation and stabilization.  Patient had history of AV block and elevated troponins a few years ago.  Looks like a cardiac work-up was negative for definite myocardial infarction but she is on fair amount of hypertension and cardiac meds as well as dyspnea on exertion.  NPO bowel rest   IVF  IV ABx - Zosyn per protocol (complicated diverticulitis)  Perc drainage 9/8 tomorrow - may have  right transgluteal window.  If no good percutaneous window, may consider repeat CT scan in 4-5 days versus need for operative laparoscopic washout or Hartmann resection.  Will defer to oncoming service surgeon, Dr. Hassell Done  Chronic constipation.  Would benefit from fiber bowel regimen  indefinitely.  Check and correct electrolytes.  Hypokalemia.   Most likely consider colectomy at some point given this being a complicated attack.  She has had colonoscopies in 2013 2017 by Dr. Oretha Caprice with [facility] gastroenterology with polyps removed.  No mention of diverticulosis.  Would be unlikely that a tumor would have grown in the next 3 years, but most likely would benefit from repeat colonoscopy before considering resection.  Ideally this attack could be cooled down with antibiotics and a drain and regroup for elective resection in 6-8 weeks.  If she does not improve or cannot be drained or certainly if she worsens, may require more aggressive resection with probable Hartmann resection this admission to turn things around.  She is not in shock and she does not have massive pneumoperitoneum.  We will try and cool this down with bowel rest, IV fluids, IV antibiotics, perk drainage.  Surgery will follow.   -VTE prophylaxis- SCDs, etc -mobilize as tolerated to help recovery  60 minutes spent in review, evaluation, examination, counseling, and coordination of care.  More than 50% of that time was spent in counseling.  Adin Hector, MD, FACS, MASCRS Gastrointestinal and Minimally Invasive Surgery    [address], [place],  [address] [phone] Main / Paging [phone] Fax   10/20/2018      Past Medical History:  Diagnosis Date  . Anemia   . Anxiety   . Depression   . Diabetes mellitus without complication ([place])    type 2-diet controlled  . Hyperglycemia   . Hyperlipidemia   . Hypertension     Past Surgical History:  Procedure Laterality Date  . APPENDECTOMY  1985  . CHOLECYSTECTOMY  1985  . COLONOSCOPY  02-28-2011   4 polyps   . DILATION AND CURETTAGE OF UTERUS  2005  . [place]  2002  . KNEE SURGERY  1983   arthroscopy?  [name] LEFT HEART CATH AND CORONARY ANGIOGRAPHY N/A 03/19/2016   Procedure: Left Heart Cath and Coronary  Angiography;  Surgeon: Peter M Martinique, MD;  Location: [facility];  Service: Cardiovascular;  Laterality: N/A;  . MOUTH SURGERY  03/08/2016  . POLYPECTOMY  2006   ?anal polyp?  [name] POLYPECTOMY  02-28-2011   4 polyps- 3 of them TA   . TEAR DUCT PROBING  2004  . TUBAL LIGATION  1985    Social History   Socioeconomic History  . Marital status: Divorced    Spouse name: Not on file  . Number of children: 3  . Years of education: Not on file  . Highest education level: Not on file  Occupational History  . Occupation: Water quality scientist: [employer]  Social Needs  . Financial resource strain: Not on file  . Food insecurity    Worry: Not on file    Inability: Not on file  . Transportation needs    Medical: Not on file    Non-medical: Not on file  Tobacco Use  . Smoking status: Former Smoker    Packs/day: 0.25    Types: Cigarettes    Quit date: 07/02/2015    Years since quitting: 3.3  . Smokeless tobacco: Never  Used  . Tobacco comment: 10 cigarettes daily  Substance and Sexual Activity  . Alcohol use: Yes    Alcohol/week: 0.0 standard drinks    Comment: socially  . Drug use: No  . Sexual activity: Yes    Birth control/protection: Post-menopausal  Lifestyle  . Physical activity    Days per week: Not on file    Minutes per session: Not on file  . Stress: Not on file  Relationships  . Social Herbalist on phone: Not on file    Gets together: Not on file    Attends religious service: Not on file    Active member of club or organization: Not on file    Attends meetings of clubs or organizations: Not on file    Relationship status: Not on file  . Intimate partner violence    Fear of current or ex partner: Not on file    Emotionally abused: Not on file    Physically abused: Not on file    Forced sexual activity: Not on file  Other Topics Concern  . Not on file  Social History Narrative   Caffeine use:  2 drinks   Regular exercise: no   Lives  with her daughter   3 children    2 grandchildren   1 great grandson    Family History  Problem Relation Age of Onset  . Hypertension Father   . Sarcoidosis Brother   . Alcohol abuse Brother   . Cirrhosis Brother   . Stroke Maternal Grandmother   . Colon cancer Neg Hx   . Colon polyps Neg Hx   . Esophageal cancer Neg Hx   . Rectal cancer Neg Hx   . Stomach cancer Neg Hx   . Pulmonary embolism Neg Hx     Current Facility-Administered Medications  Medication Dose Route Frequency Provider Last Rate Last Dose  . 0.9 %  sodium chloride infusion   Intravenous PRN Malvin Johns, MD 10 mL/hr at 10/20/18 2104 250 mL at 10/20/18 2104  . HYDROmorphone (DILAUDID) injection 0.5-2 mg  0.5-2 mg Intravenous Q2H PRN Michael Boston, MD      . lactated ringers bolus 1,000 mL  1,000 mL Intravenous Q8H PRN Mckinzy Fuller, Remo Lipps, MD      . lactated ringers bolus 1,000 mL  1,000 mL Intravenous Once Michael Boston, MD      . methocarbamol (ROBAXIN) 1,000 mg in dextrose 5 % 50 mL IVPB  1,000 mg Intravenous Q6H PRN Michael Boston, MD      . ondansetron (ZOFRAN) injection 4 mg  4 mg Intravenous Q6H PRN Michael Boston, MD       Or  . ondansetron (ZOFRAN) 8 mg in sodium chloride 0.9 % 50 mL IVPB  8 mg Intravenous Q6H PRN Michael Boston, MD      . piperacillin-tazobactam (ZOSYN) IVPB 3.375 g  3.375 g Intravenous Tor Netters, MD      . prochlorperazine (COMPAZINE) injection 5-10 mg  5-10 mg Intravenous Q4H PRN Michael Boston, MD       Current Outpatient Medications  Medication Sig Dispense Refill  . albuterol (PROVENTIL HFA;VENTOLIN HFA) 108 (90 Base) MCG/ACT inhaler Inhale 2 puffs into the lungs every 6 (six) hours as needed for wheezing or shortness of breath. 1 Inhaler 2  . amLODipine-valsartan (EXFORGE) 10-320 MG tablet TAKE 1 TABLET BY MOUTH ONCE DAILY 90 tablet 1  . ARIPiprazole (ABILIFY) 10 MG tablet   2  . aspirin EC 81 MG tablet Take  81 mg by mouth daily.    [name] atorvastatin (LIPITOR) 20 MG tablet TAKE 1  TABLET BY MOUTH ONCE DAILY 90 tablet 1  . carvedilol (COREG) 6.25 MG tablet TAKE 1 TABLET BY MOUTH TWICE DAILY WITH A MEAL 180 tablet 1  . lamoTRIgine (LAMICTAL) 100 MG tablet Take 100 mg by mouth 2 (two) times daily.   0  . levofloxacin (LEVAQUIN) 750 MG tablet Take 1 tablet (750 mg total) by mouth daily for 5 days. 5 tablet 0  . nystatin ointment (MYCOSTATIN) Apply 1 application topically 2 (two) times daily.    . ondansetron (ZOFRAN ODT) 4 MG disintegrating tablet Take 1 tablet (4 mg total) by mouth every 8 (eight) hours as needed for nausea or vomiting. 20 tablet 0  . potassium chloride 20 MEQ/15ML (10%) SOLN Take 15 mLs (20 mEq total) daily by mouth. 900 mL 3  . traZODone (DESYREL) 50 MG tablet   2  . triamcinolone ointment (KENALOG) 0.1 % Apply 1 application topically 2 (two) times daily.       Allergies  Allergen Reactions  . Sulfonamide Derivatives Hives and Other (See Comments)    Light sensitivity    ROS:   All other systems reviewed & are negative except per HPI or as noted below: Constitutional:  No fevers, chills, sweats.  Weight stable Eyes:  No vision changes, No discharge HENT:  No sore throats, nasal drainage Lymph: No neck swelling, No bruising easily Pulmonary:  No cough, productive sputum CV: No orthopnea, PND  Patient walks 30 minutes for about 1 miles without difficulty.  No exertional chest/neck/shoulder/arm pain. GI:  No personal nor family history of GI/colon cancer, inflammatory bowel disease, irritable bowel syndrome, allergy such as Celiac Sprue, dietary/dairy problems, colitis, ulcers nor gastritis.  No recent sick contacts/gastroenteritis.  No travel outside the country.  No changes in diet. Renal: No UTIs, No hematuria Genital:  No drainage, bleeding, masses Musculoskeletal: No severe joint pain.  Good ROM major joints Skin:  No sores or lesions.  No rashes Heme/Lymph:  No easy bleeding.  No swollen lymph nodes Neuro: No focal weakness/numbness.  No  seizures Psych: No suicidal ideation.  No hallucinations  BP 131/80   Pulse 84   Temp 99.3 F (37.4 C) (Oral)   Resp 17   Ht 5\' 4"  (1.626 m)   Wt 70 kg   LMP 12/25/2008   SpO2 98%   BMI 26.50 kg/m   Physical Exam: General: Pt awake/alert/oriented x4 in mild major acute distress Eyes: PERRL, normal EOM. Sclera nonicteric Neuro: CN II-XII intact w/o focal sensory/motor deficits. Lymph: No head/neck/groin lymphadenopathy Psych:  No delerium/psychosis/paranoia HENT: Normocephalic, Mucus membranes moist.  No thrush Neck: Supple, No tracheal deviation Chest: No pain.  Good respiratory excursion. CV:  Pulses intact.  Regular rhythm Abdomen: Obese but soft, Nondistended.  Right subcostal and right lower quadrant incisions consistent with prior open cholecystectomy and appendectomy.  Tenderness in suprapubic region but no major diffuse peritonitis.  Upper abdomen soft.  Nontender.  No incarcerated hernias. Gen:  No inguinal hernias.  No inguinal lymphadenopathy.   Ext:  SCDs BLE.  No significant edema.  No cyanosis Skin: No petechiae / purpurea.  No major sores Musculoskeletal: No severe joint pain.  Good ROM major joints   Results:   Labs: Results for orders placed or performed during the hospital encounter of 10/20/18 (from the past 48 hour(s))  Urinalysis, Routine w reflex microscopic     Status: Abnormal   Collection Time:  10/20/18  4:13 PM  Result Value Ref Range   Color, Urine AMBER (A) YELLOW    Comment: BIOCHEMICALS MAY BE AFFECTED BY COLOR   APPearance CLEAR CLEAR   Specific Gravity, Urine >1.030 (H) 1.005 - 1.030   pH 5.5 5.0 - 8.0   Glucose, UA NEGATIVE NEGATIVE mg/dL   Hgb urine dipstick SMALL (A) NEGATIVE   Bilirubin Urine MODERATE (A) NEGATIVE   Ketones, ur NEGATIVE NEGATIVE mg/dL   Protein, ur 100 (A) NEGATIVE mg/dL   Nitrite NEGATIVE NEGATIVE   Leukocytes,Ua NEGATIVE NEGATIVE    Comment: Performed at [facility], [address] [place],  [place] [address]  Urinalysis, Microscopic (reflex)     Status: Abnormal   Collection Time: 10/20/18  4:13 PM  Result Value Ref Range   RBC / HPF 6-10 0 - 5 RBC/hpf   WBC, UA 0-5 0 - 5 WBC/hpf   Bacteria, UA FEW (A) NONE SEEN   Squamous Epithelial / LPF 6-10 0 - 5   Mucus PRESENT    Granular Casts, UA PRESENT    WBC Casts, UA PRESENT     Comment: Performed at [facility], [address] [place], [place] [address]  Comprehensive metabolic panel     Status: Abnormal   Collection Time: 10/20/18  7:42 PM  Result Value Ref Range   Sodium 138 135 - 145 mmol/L   Potassium 3.0 (L) 3.5 - 5.1 mmol/L   Chloride 104 98 - 111 mmol/L   CO2 22 22 - 32 mmol/L   Glucose, Bld 108 (H) 70 - 99 mg/dL   BUN 13 6 - 20 mg/dL   Creatinine, Ser 0.94 0.44 - 1.00 mg/dL   Calcium 9.1 8.9 - 10.3 mg/dL   Total Protein 7.9 6.5 - 8.1 g/dL   Albumin 3.5 3.5 - 5.0 g/dL   AST 67 (H) 15 - 41 U/L   ALT 92 (H) 0 - 44 U/L   Alkaline Phosphatase 164 (H) 38 - 126 U/L   Total Bilirubin 0.9 0.3 - 1.2 mg/dL   GFR calc non Af Amer >60 >60 mL/min   GFR calc Af Amer >60 >60 mL/min   Anion gap 12 5 - 15    Comment: Performed at [facility], [address] [place], [place] [address]  CBC with Differential     Status: Abnormal   Collection Time: 10/20/18  7:42 PM  Result Value Ref Range   WBC 20.8 (H) 4.0 - 10.5 K/uL   RBC 4.18 3.87 - 5.11 MIL/uL   Hemoglobin 12.8 12.0 - 15.0 g/dL   HCT 38.9 36.0 - 46.0 %   MCV 93.1 80.0 - 100.0 fL   MCH 30.6 26.0 - 34.0 pg   MCHC 32.9 30.0 - 36.0 g/dL   RDW 13.4 11.5 - 15.5 %   Platelets 366 150 - 400 K/uL   nRBC 0.0 0.0 - 0.2 %   Neutrophils Relative % 74 %   Neutro Abs 15.3 (H) 1.7 - 7.7 K/uL   Lymphocytes Relative 16 %   Lymphs Abs 3.4 0.7 - 4.0 K/uL   Monocytes Relative 7 %   Monocytes Absolute 1.4 (H) 0.1 - 1.0 K/uL   Eosinophils Relative 1 %   Eosinophils Absolute 0.2 0.0 - 0.5 K/uL   Basophils Relative 0 %   Basophils Absolute 0.1 0.0 - 0.1 K/uL    Immature Granulocytes 2 %   Abs Immature Granulocytes 0.50 (H) 0.00 - 0.07  K/uL   Stomatocytes PRESENT    Giant PLTs PRESENT     Comment: Performed at [facility], [address] [place], [place] [address]    Imaging / Studies: Dg Abd 1 View  Result Date: 10/16/2018 CLINICAL DATA:  Abdominal pain EXAM: ABDOMEN - 1 VIEW COMPARISON:  None. FINDINGS: The bowel gas pattern is normal. No radio-opaque calculi or other significant radiographic abnormality are seen. IMPRESSION: Negative. Electronically Signed   By: Constance Holster M.D.   On: 10/16/2018 17:34   Ct Abdomen Pelvis W Contrast  Result Date: 10/20/2018 CLINICAL DATA:  Abdominal pain EXAM: CT ABDOMEN AND PELVIS WITH CONTRAST TECHNIQUE: Multidetector CT imaging of the abdomen and pelvis was performed using the standard protocol following bolus administration of intravenous contrast. CONTRAST:  165mL OMNIPAQUE IOHEXOL 300 MG/ML  SOLN COMPARISON:  None. FINDINGS: Lower chest: The visualized heart size within normal limits. No pericardial fluid/thickening. No hiatal hernia. The visualized portions of the lungs are clear. Hepatobiliary: The liver is normal in density without focal abnormality.The main portal vein is patent. The patient is status post cholecystectomy. No biliary ductal dilation. Pancreas: Unremarkable. No pancreatic ductal dilatation or surrounding inflammatory changes. Spleen: Normal in size without focal abnormality. Adrenals/Urinary Tract: Both adrenal glands appear normal. There are bilateral low-density lesions seen within both kidneys. The largest in the upper pole of the left kidney measuring 1.5 cm, likely simple renal cysts. Stomach/Bowel: The stomach is normal in appearance. There is a duodenal diverticulum with retained food stuff seen within the third portion of the duodenum. The remainder of the small bowel is unremarkable.Surgical clips seen in the right lower quadrant. There is a 9 cm segment of sigmoid colonic  diverticulitis with diffuse bowel wall thickening and surrounding extensive mesenteric fat stranding changes. Small amount of pneumoperitoneum is seen adjacent to the sigmoid colon. There is a multilocular collection extending around the sigmoid colon best seen on series 2, image 64, measuring approximately 7.0 x 2.7 x 3.6 cm. The collection has fluid and small foci of air and is peripherally enhancing. Vascular/Lymphatic: There are no enlarged mesenteric, retroperitoneal, or pelvic lymph nodes. No significant vascular findings are present. Reproductive: The uterus and adnexa are unremarkable. there is free fluid seen within the cul-de-sac.1 Other: No evidence of abdominal wall mass or hernia. Musculoskeletal: No acute or significant osseous findings. IMPRESSION: Acute sigmoid colonic diverticulitis with perforation and multilocular pericolonic abscess measuring 7.0 x 2.7 x3.6 cm. These results were called by telephone at the time of interpretation on 10/20/2018 at 8:22 pm to Martinique Robinson PA, who verbally acknowledged these results. Electronically Signed   By: [name] M.D.   On: 10/20/2018 20:26    Medications / Allergies: per chart  Antibiotics: Anti-infectives (From admission, onward)   Start     Dose/Rate Route Frequency Ordered Stop   10/20/18 2115  piperacillin-tazobactam (ZOSYN) IVPB 3.375 g     3.375 g 12.5 mL/hr over 240 Minutes Intravenous Every 8 hours 10/20/18 2107     10/20/18 2100  piperacillin-tazobactam (ZOSYN) IVPB 3.375 g  Status:  Discontinued     3.375 g 100 mL/hr over 30 Minutes Intravenous  Once 10/20/18 2052 10/21/18 0105        Note: Portions of this report may have been transcribed using voice recognition software. Every effort was made to ensure accuracy; however, inadvertent computerized transcription errors may be present.   Any transcriptional errors that result from this process are unintentional.    Adin Hector,  MD, FACS, MASCRS Gastrointestinal and  Minimally Invasive Surgery    [address] [place], [place] [address] [phone] Main / Paging [phone] Fax   10/20/2018

## 2018-10-21 DIAGNOSIS — R7302 Impaired glucose tolerance (oral): Secondary | ICD-10-CM

## 2018-10-21 DIAGNOSIS — E876 Hypokalemia: Secondary | ICD-10-CM

## 2018-10-21 DIAGNOSIS — K651 Peritoneal abscess: Secondary | ICD-10-CM

## 2018-10-21 DIAGNOSIS — F419 Anxiety disorder, unspecified: Secondary | ICD-10-CM

## 2018-10-21 LAB — CBC WITH DIFFERENTIAL/PLATELET
Abs Immature Granulocytes: 0.46 10*3/uL — ABNORMAL HIGH (ref 0.00–0.07)
Basophils Absolute: 0.1 10*3/uL (ref 0.0–0.1)
Basophils Relative: 1 %
Eosinophils Absolute: 0.2 10*3/uL (ref 0.0–0.5)
Eosinophils Relative: 1 %
HCT: 34.1 % — ABNORMAL LOW (ref 36.0–46.0)
Hemoglobin: 11.4 g/dL — ABNORMAL LOW (ref 12.0–15.0)
Immature Granulocytes: 2 %
Lymphocytes Relative: 18 %
Lymphs Abs: 3.5 10*3/uL (ref 0.7–4.0)
MCH: 31.5 pg (ref 26.0–34.0)
MCHC: 33.4 g/dL (ref 30.0–36.0)
MCV: 94.2 fL (ref 80.0–100.0)
Monocytes Absolute: 1.7 10*3/uL — ABNORMAL HIGH (ref 0.1–1.0)
Monocytes Relative: 9 %
Neutro Abs: 14 10*3/uL — ABNORMAL HIGH (ref 1.7–7.7)
Neutrophils Relative %: 69 %
Platelets: 350 10*3/uL (ref 150–400)
RBC: 3.62 MIL/uL — ABNORMAL LOW (ref 3.87–5.11)
RDW: 13.6 % (ref 11.5–15.5)
WBC: 19.9 10*3/uL — ABNORMAL HIGH (ref 4.0–10.5)
nRBC: 0 % (ref 0.0–0.2)

## 2018-10-21 LAB — COMPREHENSIVE METABOLIC PANEL
ALT: 74 U/L — ABNORMAL HIGH (ref 0–44)
AST: 47 U/L — ABNORMAL HIGH (ref 15–41)
Albumin: 3.1 g/dL — ABNORMAL LOW (ref 3.5–5.0)
Alkaline Phosphatase: 137 U/L — ABNORMAL HIGH (ref 38–126)
Anion gap: 10 (ref 5–15)
BUN: 11 mg/dL (ref 6–20)
CO2: 21 mmol/L — ABNORMAL LOW (ref 22–32)
Calcium: 8.5 mg/dL — ABNORMAL LOW (ref 8.9–10.3)
Chloride: 108 mmol/L (ref 98–111)
Creatinine, Ser: 0.81 mg/dL (ref 0.44–1.00)
GFR calc Af Amer: >60 mL/min (ref >60)
GFR calc non Af Amer: >60 mL/min (ref >60)
Glucose, Bld: 109 mg/dL — ABNORMAL HIGH (ref 70–99)
Potassium: 3.2 mmol/L — ABNORMAL LOW (ref 3.5–5.1)
Sodium: 139 mmol/L (ref 135–145)
Total Bilirubin: 1.2 mg/dL (ref 0.3–1.2)
Total Protein: 6.7 g/dL (ref 6.5–8.1)

## 2018-10-21 LAB — HIV ANTIBODY (ROUTINE TESTING W REFLEX): HIV Screen 4th Generation wRfx: NONREACTIVE

## 2018-10-21 LAB — GLUCOSE, CAPILLARY: Glucose-Capillary: 110 mg/dL — ABNORMAL HIGH (ref 70–99)

## 2018-10-21 LAB — MAGNESIUM: Magnesium: 1.9 mg/dL (ref 1.7–2.4)

## 2018-10-21 MED ORDER — LABETALOL HCL 5 MG/ML IV SOLN
10.0000 mg | INTRAVENOUS | Status: DC | PRN
  Filled 2018-10-21: qty 4

## 2018-10-21 MED ORDER — POTASSIUM CHLORIDE CRYS ER 20 MEQ PO TBCR
40.0000 meq | EXTENDED_RELEASE_TABLET | Freq: Two times a day (BID) | ORAL | Status: DC
  Administered 2018-10-21 – 2018-10-22 (×3): 40 meq via ORAL
  Filled 2018-10-21 (×3): qty 2

## 2018-10-21 MED ORDER — POTASSIUM CHLORIDE IN NACL 40-0.9 MEQ/L-% IV SOLN
INTRAVENOUS | Status: DC
  Administered 2018-10-21 – 2018-10-22 (×3): 75 mL/h via INTRAVENOUS
  Filled 2018-10-21 (×3): qty 1000

## 2018-10-21 NOTE — Progress Notes (Signed)
Patient ID: Kaitlyn Harper, female   DOB: Aug 24, 1960, 58 y.o.   MRN: [id number] Request received for image guided drainage of diverticular abscess in pt. Imaging studies were reviewed by Dr. Laurence Ferrari and there is no safe percutaneous window for drainage of abscess at this time.  Recommend follow-up imaging in 3 to 4 days and continued antibiotic therapy.Please contact Dr. Laurence Ferrari at [phone] with any additional questions.

## 2018-10-21 NOTE — Progress Notes (Signed)
[facility]/Trauma Progress Note      Assessment/Plan HTN Type 2 diabetes Depression Anxiety  Acute sigmoid diverticulitis with perforation and pericolonic abscess 7X2.7X 3.6cm - IR unable to drain this collection, no safe window.  They recommend follow-up CT in a few days - Hopefully they will be able to drain this abscess in a few days and we can avoid surgery in this patient. - We will follow as patient will likely need surgery if she is unable to have this abscess drained - Continue IV antibiotics and bowel rest - Ambulate  FEN: N.p.o. with sips VTE: SCD's ID: Zosyn 09/07>> Foley: None Follow up: TBD    LOS: 1 day    Subjective: CC: Abdominal pain  Patient states she is not needing any pain medicine for her abdominal pain.  She states she feels better today.  She denies nausea or vomiting.  I discussed with her that IR was unable to drain her abscess.  Objective: Vital signs in last 24 hours: Temp:  [98.6 F (37 C)-99.7 F (37.6 C)] 98.6 F (37 C) (09/08 0535) Pulse Rate:  [76-114] 76 (09/08 0535) Resp:  [14-17] 17 (09/08 0535) BP: (127-172)/(73-98) 127/74 (09/08 0535) SpO2:  [92 %-100 %] 92 % (09/08 0535) Weight:  [70 kg] 70 kg (09/07 1621) Last BM Date: 10/19/18  Intake/Output from previous day: 09/07 0701 - 09/08 0700 In: 13.8 [I.V.:0.1; IV Piggyback:13.6] Out: 100 [Urine:100] Intake/Output this shift: Total I/O In: 1059.3 [I.V.:1019.8; IV Piggyback:39.6] Out: 300 [Urine:300]  PE: Gen:  Alert, NAD, pleasant, cooperative, well-appearing Pulm: Rate and effort normal Abd: Soft, ND, +BS, TTP of suprapubic region with guarding, no peritonitis Skin: no rashes noted, warm and dry   Anti-infectives: Anti-infectives (From admission, onward)   Start     Dose/Rate Route Frequency Ordered Stop   10/20/18 2115  piperacillin-tazobactam (ZOSYN) IVPB 3.375 g     3.375 g 12.5 mL/hr over 240 Minutes Intravenous Every 8 hours 10/20/18 2107     10/20/18 2100  piperacillin-tazobactam (ZOSYN) IVPB 3.375 g  Status:  Discontinued     3.375 g 100 mL/hr over 30 Minutes Intravenous  Once 10/20/18 2052 10/21/18 0105      Lab Results:  Recent Labs    10/20/18 1942 10/21/18 0300  WBC 20.8* 19.9*  HGB 12.8 11.4*  HCT 38.9 34.1*  PLT 366 350   BMET Recent Labs    10/20/18 1942 10/21/18 0300  NA 138 139  K 3.0* 3.2*  CL 104 108  CO2 22 21*  GLUCOSE 108* 109*  BUN 13 11  CREATININE 0.94 0.81  CALCIUM 9.1 8.5*   PT/INR Recent Labs    10/20/18 2138  LABPROT 14.5  INR 1.1   CMP     Component Value Date/Time   NA 139 10/21/2018 0300   K 3.2 (L) 10/21/2018 0300   CL 108 10/21/2018 0300   CO2 21 (L) 10/21/2018 0300   GLUCOSE 109 (H) 10/21/2018 0300   BUN 11 10/21/2018 0300   CREATININE 0.81 10/21/2018 0300   CREATININE 1.03 04/19/2017 1617   CALCIUM 8.5 (L) 10/21/2018 0300   PROT 6.7 10/21/2018 0300   ALBUMIN 3.1 (L) 10/21/2018 0300   AST 47 (H) 10/21/2018 0300   ALT 74 (H) 10/21/2018 0300   ALKPHOS 137 (H) 10/21/2018 0300   BILITOT 1.2 10/21/2018 0300   GFRNONAA >60 10/21/2018 0300   GFRNONAA 61 08/25/2013 1129   GFRAA >60 10/21/2018 0300   GFRAA 71 08/25/2013 1129   Lipase  No results found for: LIPASE  Studies/Results: Ct Abdomen Pelvis W Contrast  Result Date: 10/20/2018 CLINICAL DATA:  Abdominal pain EXAM: CT ABDOMEN AND PELVIS WITH CONTRAST TECHNIQUE: Multidetector CT imaging of the abdomen and pelvis was performed using the standard protocol following bolus administration of intravenous contrast. CONTRAST:  185mL OMNIPAQUE IOHEXOL 300 MG/ML  SOLN COMPARISON:  None. FINDINGS: Lower chest: The visualized heart size within normal limits. No pericardial fluid/thickening. No hiatal hernia. The visualized portions of the lungs are clear. Hepatobiliary: The liver is normal in density without focal abnormality.The main portal vein is patent. The patient is status post cholecystectomy. No biliary ductal dilation.  Pancreas: Unremarkable. No pancreatic ductal dilatation or surrounding inflammatory changes. Spleen: Normal in size without focal abnormality. Adrenals/Urinary Tract: Both adrenal glands appear normal. There are bilateral low-density lesions seen within both kidneys. The largest in the upper pole of the left kidney measuring 1.5 cm, likely simple renal cysts. Stomach/Bowel: The stomach is normal in appearance. There is a duodenal diverticulum with retained food stuff seen within the third portion of the duodenum. The remainder of the small bowel is unremarkable.Surgical clips seen in the right lower quadrant. There is a 9 cm segment of sigmoid colonic diverticulitis with diffuse bowel wall thickening and surrounding extensive mesenteric fat stranding changes. Small amount of pneumoperitoneum is seen adjacent to the sigmoid colon. There is a multilocular collection extending around the sigmoid colon best seen on series 2, image 64, measuring approximately 7.0 x 2.7 x 3.6 cm. The collection has fluid and small foci of air and is peripherally enhancing. Vascular/Lymphatic: There are no enlarged mesenteric, retroperitoneal, or pelvic lymph nodes. No significant vascular findings are present. Reproductive: The uterus and adnexa are unremarkable. there is free fluid seen within the cul-de-sac.1 Other: No evidence of abdominal wall mass or hernia. Musculoskeletal: No acute or significant osseous findings. IMPRESSION: Acute sigmoid colonic diverticulitis with perforation and multilocular pericolonic abscess measuring 7.0 x 2.7 x3.6 cm. These results were called by telephone at the time of interpretation on 10/20/2018 at 8:22 pm to Martinique Robinson PA, who verbally acknowledged these results. Electronically Signed   By: [name] M.D.   On: 10/20/2018 20:26      Kalman Drape , [facility] Surgery 10/21/2018, 11:32 AM  Pager: [phone] Mon-Wed, Friday 7:00am-4:30pm Thurs 7am-11:30am  Consults:  [phone]

## 2018-10-21 NOTE — Progress Notes (Addendum)
[facility] Progress Note  Subjective: patient has no new c/o's, still having lower abd pain in the bladder area.  IR has seen pt and unable to safely aspirate the abscess in the low pelvis.     Vitals:   10/20/18 2105 10/20/18 2258 10/21/18 0535 10/21/18 1318  BP: 131/80 (!) 148/73 127/74 125/70  Pulse: 84 89 76 70  Resp: 17 17 17 18   Temp:  98.9 F (37.2 C) 98.6 F (37 C) 98.3 F (36.8 C)  TempSrc:  Oral Oral   SpO2: 98% 98% 92% 100%  Weight:      Height:        Inpatient medications: . potassium chloride  40 mEq Oral BID   . sodium chloride 250 mL (10/20/18 2104)  . lactated ringers    . methocarbamol (ROBAXIN) IV    . ondansetron (ZOFRAN) IV    . piperacillin-tazobactam (ZOSYN)  IV 3.375 g (10/21/18 1300)   sodium chloride, acetaminophen **OR** acetaminophen, albuterol, HYDROmorphone (DILAUDID) injection, labetalol, lactated ringers, methocarbamol (ROBAXIN) IV, ondansetron (ZOFRAN) IV **OR** ondansetron (ZOFRAN) IV, prochlorperazine  Exam:  alert, in street clothes  No jvd   Chest clear bilat, no rales or wheezing   Cor reg no mrg   Abd mild-mod tenderness in SP region, no bulging or rebound, decd' BS, no ascites or HSM   Ext no edema LE's   NF, Ox 3    HPI: Kaitlyn Harper is a 58 y.o. female with medical history significant for hypertension, diet-controlled diabetes mellitus, hyperlipidemia, depression, and anxiety, now presenting to the emergency department for evaluation of abdominal pain.  Patient developed lower abdominal discomfort, fevers, chills, and nausea without vomiting approximately 1 week ago, was suspected to have UTI, was given a dose of Rocephin and started on Levaquin on 10/16/2018, continued to feel poorly with ongoing lower abdominal pain, was noted to have leukocytosis greater than 20,000 on outpatient blood work, and was directed to the ED for further evaluation of this.  She has had some loose stools without any melena or hematochezia,  has nausea and loss of appetite, but no vomiting.  She had never experienced these symptoms previously.  Denies cough or shortness of breath. [facility] ED Course: Upon arrival to the ED, patient is found to be afebrile, saturating well on room air, slightly tachycardic, and with stable blood pressure.  Chemistry panel is notable for potassium of 3.0 and mild elevation in transaminases.  CBC features a leukocytosis to 20,800.  CT of the abdomen and pelvis is concerning for acute sigmoid diverticulitis with perforation and multilocular pericolonic abscess.  COVID-19 is negative. Patient was given a liter of lactated Ringer's, Zofran, Dilaudid, and Zosyn in the emergency department.  Surgery was consulted by the ED physician and recommended medical admission.   Home meds:  - amlodipine-valsartan 10-320 qd/ carvedilol 6.25 bid  - trazodone 100 hs/ aripiprazole 10 qam  - lamotrigine 100 bid     - aspirin 81/ atorvastatin 20 qam  - prn's/ vitamins/ supplements    Hospital Course:  1. Acute diverticulitis with perforation and abscess  - Presents with persistent lower abdominal pain, fevers, and nausea despite treatment for suspected UTI, and is found to have acute sigmoid diverticulitis with perforation and abscess  - She has been hemodynamically stable  - Surgery is consulting actively - IR has been consulted and today said that they are unable to safely drain the abscess due to it's positioning - Temps improving 101 > 98,  WBC slightly down 22 > 21 > 19k - NPO and no appetite will resume IVF's at 75/hr - Continue bowel rest, Zosyn, pain-control, IVF hydration, and antiemetics    2. Hypertension  - BP at goal in 120/80 range - holding home ARB/ CCB and coreg  - Use labetalol IVP's as needed while NPO    3. Type II DM  - Diet-controlled at home, serum glucose is 108 in ED  - BS's are stable - Monitoring CBG's qam, holding off on SSI for now  4. Hypokalemia  - Serum  potassium 3.0 in ED  - K 3.2 this am > po KCl 40 x 2 ordered for today, also K+ 40/L in IVF - KCl added to IVF, repeat chem panel in am     Kelly Splinter, MD 10/21/2018, 5:05 PM    DVT prophylaxis: SCD's  Code Status: Full  Family Communication: Discussed with patient  Consults called: Surgery  Admission status: Inpatient/ med- surg   Kelly Splinter / [facility] [phone] 10/21/2018, 4:56 PM   Recent Labs  Lab 10/16/18 1731 10/20/18 1942 10/21/18 0300  NA 138 138 139  K 3.3* 3.0* 3.2*  CL 103 104 108  CO2 22 22 21*  GLUCOSE 110* 108* 109*  BUN 10 13 11   CREATININE 1.10* 0.94 0.81  CALCIUM 9.2 9.1 8.5*   Recent Labs  Lab 10/20/18 1942 10/21/18 0300  AST 67* 47*  ALT 92* 74*  ALKPHOS 164* 137*  BILITOT 0.9 1.2  PROT 7.9 6.7  ALBUMIN 3.5 3.1*   Recent Labs  Lab 10/16/18 1731 10/20/18 1942 10/21/18 0300  WBC 22.6* 20.8* 19.9*  NEUTROABS 19.5* 15.3* 14.0*  HGB 14.2 12.8 11.4*  HCT 41.1 38.9 34.1*  MCV 92.8 93.1 94.2  PLT 254 366 350   Iron/TIBC/Ferritin/ %Sat No results found for: IRON, TIBC, FERRITIN, IRONPCTSAT

## 2018-10-22 DIAGNOSIS — Z720 Tobacco use: Secondary | ICD-10-CM

## 2018-10-22 DIAGNOSIS — I440 Atrioventricular block, first degree: Secondary | ICD-10-CM

## 2018-10-22 DIAGNOSIS — E782 Mixed hyperlipidemia: Secondary | ICD-10-CM

## 2018-10-22 DIAGNOSIS — D649 Anemia, unspecified: Secondary | ICD-10-CM

## 2018-10-22 DIAGNOSIS — F329 Major depressive disorder, single episode, unspecified: Secondary | ICD-10-CM

## 2018-10-22 LAB — COMPREHENSIVE METABOLIC PANEL
ALT: 67 U/L — ABNORMAL HIGH (ref 0–44)
AST: 45 U/L — ABNORMAL HIGH (ref 15–41)
Albumin: 3.1 g/dL — ABNORMAL LOW (ref 3.5–5.0)
Alkaline Phosphatase: 164 U/L — ABNORMAL HIGH (ref 38–126)
Anion gap: 8 (ref 5–15)
BUN: 9 mg/dL (ref 6–20)
CO2: 21 mmol/L — ABNORMAL LOW (ref 22–32)
Calcium: 8.9 mg/dL (ref 8.9–10.3)
Chloride: 111 mmol/L (ref 98–111)
Creatinine, Ser: 0.89 mg/dL (ref 0.44–1.00)
GFR calc Af Amer: >60 mL/min (ref >60)
GFR calc non Af Amer: >60 mL/min (ref >60)
Glucose, Bld: 90 mg/dL (ref 70–99)
Potassium: 4.2 mmol/L (ref 3.5–5.1)
Sodium: 140 mmol/L (ref 135–145)
Total Bilirubin: 1.8 mg/dL — ABNORMAL HIGH (ref 0.3–1.2)
Total Protein: 7.1 g/dL (ref 6.5–8.1)

## 2018-10-22 LAB — PHOSPHORUS: Phosphorus: 3.1 mg/dL (ref 2.5–4.6)

## 2018-10-22 LAB — CBC
HCT: 34.6 % — ABNORMAL LOW (ref 36.0–46.0)
Hemoglobin: 11.2 g/dL — ABNORMAL LOW (ref 12.0–15.0)
MCH: 31.4 pg (ref 26.0–34.0)
MCHC: 32.4 g/dL (ref 30.0–36.0)
MCV: 96.9 fL (ref 80.0–100.0)
Platelets: 382 10*3/uL (ref 150–400)
RBC: 3.57 MIL/uL — ABNORMAL LOW (ref 3.87–5.11)
RDW: 13.8 % (ref 11.5–15.5)
WBC: 17.2 10*3/uL — ABNORMAL HIGH (ref 4.0–10.5)
nRBC: 0 % (ref 0.0–0.2)

## 2018-10-22 LAB — GLUCOSE, CAPILLARY: Glucose-Capillary: 84 mg/dL (ref 70–99)

## 2018-10-22 LAB — MAGNESIUM: Magnesium: 2.1 mg/dL (ref 1.7–2.4)

## 2018-10-22 NOTE — Progress Notes (Signed)
PROGRESS NOTE    Kaitlyn Harper  [id number] DOB: [birth year] DOA: 10/20/2018 PCP: Debbrah Alar, NP   Brief Narrative:  HPI per Dr. Mitzi Hansen on 10/20/2018 Kaitlyn Harper is a 58 y.o. female with medical history significant for hypertension, diet-controlled diabetes mellitus, hyperlipidemia, depression, and anxiety, now presenting to the emergency department for evaluation of abdominal pain.  Patient developed lower abdominal discomfort, fevers, chills, and nausea without vomiting approximately 1 week ago, was suspected to have UTI, was given a dose of Rocephin and started on Levaquin on 10/16/2018, continued to feel poorly with ongoing lower abdominal pain, was noted to have leukocytosis greater than 20,000 on outpatient blood work, and was directed to the ED for further evaluation of this.  She has had some loose stools without any melena or hematochezia, has nausea and loss of appetite, but no vomiting.  She had never experienced these symptoms previously.  Denies cough or shortness of breath.  [facility] ED Course: Upon arrival to the ED, patient is found to be afebrile, saturating well on room air, slightly tachycardic, and with stable blood pressure.  Chemistry panel is notable for potassium of 3.0 and mild elevation in transaminases.  CBC features a leukocytosis to 20,800.  CT of the abdomen and pelvis is concerning for acute sigmoid diverticulitis with perforation and multilocular pericolonic abscess.  COVID-19 is negative. Patient was given a liter of lactated Ringer's, Zofran, Dilaudid, and Zosyn in the emergency department.  Surgery was consulted by the ED physician and recommended medical admission.  **Interim History  Surgery evaluated and recommended IR to evaluate for possible drain placement however there is no safe window for drain to be placed and they recommended a follow-up CT in a few days.  We will continue n.p.o. for now with IV antibiotics and bowel  rest and recommending ambulating.  General surgery still hope and patient to have abscess drained after repeat CT scan a few days and they will continue to follow this patient if he is unable to have the abscess drained.  Patient is having flatus but currently having a bowel movement.  Continues to have some pain.  Assessment & Plan:   Principal Problem:   Diverticulitis of large intestine with perforation and abscess Active Problems:   Essential hypertension   Hyperlipidemia   Tobacco abuse   Hypokalemia   Impaired glucose tolerance   Depressive disorder   Anemia   Anxiety   AV block, 1st degree   Acute Diverticulitis with perforation and abscess -Presents with persistent lower abdominal pain, fevers, and nausea despite treatment for suspected UTI, and is found to have acute sigmoid diverticulitis with perforation and abscess -She has been hemodynamically stable -Surgery is consulting and appreciate further evaluation and recommendations -IR has been consulted and today said that they are unable to safely drain the abscess due to it's positioning -Temps improving 101 and TMax wa 99.4, WBC slightly down 22.6 -> 17.2 -NPO and no appetite will resume IVF's at 75/hr -Continue bowel rest,  -IV Zosyn for Abx Coverage and with Pain-control with IV Hydromorphone 0.5-1 mg IV q3hprn Moderate and Severe Pain and also continue with IV methocarbamol 1000 mg every 6 hours PRN for muscle spasm -IVF hydration with NS + 40 mEQ KCl at 75 mL/hr -Antiemeticswith IV Zofran  -Further care per general surgery and appreciate further recommendations; patient to get lactated Ringer boluses 1 L every 8 hours PRN for systolic blood pressure less than 90 or urine output less  than 250 mL's every 8 hours for 2 days  Hypertension -BP at goal in 120/80 range on admission but now currently 150/over 66 - Continue holding home ARB/ CCB and coreg -Use labetalol IVP's as needed while NPO; currently on IV  labetalol 10 mg every 2 as needed for systolic blood pressure around [id number] or diastolic pressure than [id number]  Type II DM -Diet-controlled at home, serum glucose is 108 in EDand repeat this morning was 90 - BS's are stable and CBGs have been ranging from 84-110 and dated blood sugars have been ranging from 90-109 on PPI/therapy -Monitoring CBG's qam, holding off on SSI for now and will continue to monitor carefully  Hypokalemia -Serum potassium 3.0 in ED - K 3.2 this am > po KCl 40 x 2 now discontinued, also K+ 40/L in IVF - We will discontinue p.o. KCl now that patient's K+ is now 4.2 - Continue to monitor replete as necessary - Continue to trend and repeat CMP in a.m.  Abnormal LFTs -Unclear Etiology but suspect in the setting of Diverticulitis and trending down -Patient's AST went from 67 -> 47 -> 45 -Patient's ALT went from 92 -> 74 -> 67 -Continue to Monitor and Trend and if worsening or not improving will obtain a RUQ U/S and a Acute Hepatitis Panel -Repeat CMP in AM   Hyperbilirubinemia -Likely reactive -Patient's T bili 0.9 is now 1.8 -Continue to monitor and trend -Repeat CMP in a.m.  Overweight -Estimated body mass index is 26.5 kg/m as calculated from the following:   Height as of this encounter: 5\' 4"  (1.626 m).   Weight as of this encounter: 70 kg. -Weight Loss and Dietary Counseling given   Metabolic Acidosis -Pateint's CO2 is 21, AG is 10 and Chlroide is 108 -Continue to Monitor and Trend -Repeat CMP in AM   Normocytic Anemia -Patient's Hb/Hct went from 12.8/38.9 -> 11.4/34.1 -> 11.2/34.6 -Check Anemia Panel in the AM -Continue to Monitor for S/Sx of Bleeding; Currently no overt Bleeding noted -Repeat CBC in AM   DVT prophylaxis: SCDs Code Status: FULL CODE Family Communication: No family present at bedside  Disposition Plan: Pending improvement and clearance by General Surgery   Consultants:   General Surgery  Interventional Radiology     Procedures: None  Antimicrobials:  Anti-infectives (From admission, onward)   Start     Dose/Rate Route Frequency Ordered Stop   10/20/18 2115  piperacillin-tazobactam (ZOSYN) IVPB 3.375 g     3.375 g 12.5 mL/hr over 240 Minutes Intravenous Every 8 hours 10/20/18 2107     10/20/18 2100  piperacillin-tazobactam (ZOSYN) IVPB 3.375 g  Status:  Discontinued     3.375 g 100 mL/hr over 30 Minutes Intravenous  Once 10/20/18 2052 10/21/18 0105     Subjective: Seen and examined at bedside states that she still having some pain and feels uncomfortable.  Denies any chest pain, lightheadedness or dizziness.  No nausea or vomiting.  Having some flatus but no bowel movement.  No other concerns reported at this time.  Objective: Vitals:   10/21/18 0535 10/21/18 1318 10/21/18 2027 10/22/18 0610  BP: 127/74 125/70 136/74 135/75  Pulse: 76 70 73 76  Resp: 17 18 18 17   Temp: 98.6 F (37 C) 98.3 F (36.8 C) 98.6 F (37 C) 99.4 F (37.4 C)  TempSrc: Oral  Oral Oral  SpO2: 92% 100% 97% 98%  Weight:      Height:        Intake/Output Summary (Last 24 hours)  at 10/22/2018 0836 Last data filed at 10/22/2018 [id number] Gross per 24 hour  Intake 1371.74 ml  Output 1050 ml  Net 321.74 ml   Filed Weights   10/20/18 1621  Weight: 70 kg   Examination: Physical Exam:  Constitutional: WN/WD overweight African-American female currently NAD and appears calm but a little uncomfortable Eyes: Lids and conjunctivae normal, sclerae anicteric  ENMT: External Ears, Nose appear normal. Grossly normal hearing. Mucous membranes are moist.  Neck: Appears normal, supple, no cervical masses, normal ROM, no appreciable thyromegaly; no JVD Respiratory: Diminished to auscultation bilaterally, no wheezing, rales, rhonchi or crackles. Normal respiratory effort and patient is not tachypenic. No accessory muscle use.  Cardiovascular: RRR, no murmurs / rubs / gallops. S1 and S2 auscultated. No extremity edema.  Abdomen:  Soft, Tender to palpate, non-distended. No masses palpated. No appreciable hepatosplenomegaly. Bowel sounds positive x4.  GU: Deferred. Musculoskeletal: No clubbing / cyanosis of digits/nails. No contractures or cyanosis Skin: No rashes, lesions, ulcers on a limited skin evaluation. No induration; Warm and dry.  Neurologic: CN 2-12 grossly intact with no focal deficits. Romberg sign cerebellar and reflexes not assessed.  Psychiatric: Normal judgment and insight. Alert and oriented x 3. Normal mood and appropriate affect.   Data Reviewed: I have personally reviewed following labs and imaging studies  CBC: Recent Labs  Lab 10/16/18 1731 10/20/18 1942 10/21/18 0300 10/22/18 0819  WBC 22.6* 20.8* 19.9* 17.2*  NEUTROABS 19.5* 15.3* 14.0*  --   HGB 14.2 12.8 11.4* 11.2*  HCT 41.1 38.9 34.1* 34.6*  MCV 92.8 93.1 94.2 96.9  PLT 254 366 350 [id number]   Basic Metabolic Panel: Recent Labs  Lab 10/16/18 1731 10/20/18 1942 10/21/18 0300  NA 138 138 139  K 3.3* 3.0* 3.2*  CL 103 104 108  CO2 22 22 21*  GLUCOSE 110* 108* 109*  BUN 10 13 11   CREATININE 1.10* 0.94 0.81  CALCIUM 9.2 9.1 8.5*  MG  --   --  1.9   GFR: Estimated Creatinine Clearance: 70.9 mL/min (by C-G formula based on SCr of 0.81 mg/dL). Liver Function Tests: Recent Labs  Lab 10/20/18 1942 10/21/18 0300  AST 67* 47*  ALT 92* 74*  ALKPHOS 164* 137*  BILITOT 0.9 1.2  PROT 7.9 6.7  ALBUMIN 3.5 3.1*   No results for input(s): LIPASE, AMYLASE in the last 168 hours. No results for input(s): AMMONIA in the last 168 hours. Coagulation Profile: Recent Labs  Lab 10/20/18 2138  INR 1.1   Cardiac Enzymes: No results for input(s): CKTOTAL, CKMB, CKMBINDEX, TROPONINI in the last 168 hours. BNP (last 3 results) No results for input(s): PROBNP in the last 8760 hours. HbA1C: No results for input(s): HGBA1C in the last 72 hours. CBG: Recent Labs  Lab 10/21/18 0721 10/22/18 0733  GLUCAP 110* 84   Lipid Profile: No  results for input(s): CHOL, HDL, LDLCALC, TRIG, CHOLHDL, LDLDIRECT in the last 72 hours. Thyroid Function Tests: No results for input(s): TSH, T4TOTAL, FREET4, T3FREE, THYROIDAB in the last 72 hours. Anemia Panel: No results for input(s): VITAMINB12, FOLATE, FERRITIN, TIBC, IRON, RETICCTPCT in the last 72 hours. Sepsis Labs: No results for input(s): PROCALCITON, LATICACIDVEN in the last 168 hours.  Recent Results (from the past 240 hour(s))  Urine culture     Status: Abnormal   Collection Time: 10/16/18  5:58 PM   Specimen: Urine, Clean Catch  Result Value Ref Range Status   Specimen Description URINE, CLEAN CATCH  Final   Special Requests NONE  Final   Culture (A)  Final    <10,000 COLONIES/mL INSIGNIFICANT GROWTH Performed at [facility], [place] [address] [place], [place] [address]    Report Status 10/17/2018 FINAL  Final  SARS Coronavirus 2 [facility] order, Performed in [facility] hospital lab) Nasopharyngeal Nasopharyngeal Swab     Status: None   Collection Time: 10/20/18  9:51 PM   Specimen: Nasopharyngeal Swab  Result Value Ref Range Status   SARS Coronavirus 2 NEGATIVE NEGATIVE Final    Comment: (NOTE) If result is NEGATIVE SARS-CoV-2 target nucleic acids are NOT DETECTED. The SARS-CoV-2 RNA is generally detectable in upper and lower  respiratory specimens during the acute phase of infection. The lowest  concentration of SARS-CoV-2 viral copies this assay can detect is 250  copies / mL. A negative result does not preclude SARS-CoV-2 infection  and should not be used as the sole basis for treatment or other  patient management decisions.  A negative result may occur with  improper specimen collection / handling, submission of specimen other  than nasopharyngeal swab, presence of viral mutation(s) within the  areas targeted by this assay, and inadequate number of viral copies  (<250 copies / mL). A negative result must be combined with clinical  observations, patient  history, and epidemiological information. If result is POSITIVE SARS-CoV-2 target nucleic acids are DETECTED. The SARS-CoV-2 RNA is generally detectable in upper and lower  respiratory specimens dur ing the acute phase of infection.  Positive  results are indicative of active infection with SARS-CoV-2.  Clinical  correlation with patient history and other diagnostic information is  necessary to determine patient infection status.  Positive results do  not rule out bacterial infection or co-infection with other viruses. If result is PRESUMPTIVE POSTIVE SARS-CoV-2 nucleic acids MAY BE PRESENT.   A presumptive positive result was obtained on the submitted specimen  and confirmed on repeat testing.  While 2019 novel coronavirus  (SARS-CoV-2) nucleic acids may be present in the submitted sample  additional confirmatory testing may be necessary for epidemiological  and / or clinical management purposes  to differentiate between  SARS-CoV-2 and other Sarbecovirus currently known to infect humans.  If clinically indicated additional testing with an alternate test  methodology [id number]) is advised. The SARS-CoV-2 RNA is generally  detectable in upper and lower respiratory sp ecimens during the acute  phase of infection. The expected result is Negative. Fact Sheet for Patients:  StrictlyIdeas.no Fact Sheet for Healthcare Providers: BankingDealers.co.za This test is not yet approved or cleared by the [place] FDA and has been authorized for detection and/or diagnosis of SARS-CoV-2 by FDA under an Emergency Use Authorization (EUA).  This EUA will remain in effect (meaning this test can be used) for the duration of the COVID-19 declaration under Section 564(b)(1) of the Act, 21 U.S.C. section 360bbb-3(b)(1), unless the authorization is terminated or revoked sooner. Performed at [facility], [address] [place], [place]  [address]     Radiology Studies: Ct Abdomen Pelvis W Contrast  Result Date: 10/20/2018 CLINICAL DATA:  Abdominal pain EXAM: CT ABDOMEN AND PELVIS WITH CONTRAST TECHNIQUE: Multidetector CT imaging of the abdomen and pelvis was performed using the standard protocol following bolus administration of intravenous contrast. CONTRAST:  139mL OMNIPAQUE IOHEXOL 300 MG/ML  SOLN COMPARISON:  None. FINDINGS: Lower chest: The visualized heart size within normal limits. No pericardial fluid/thickening. No hiatal hernia. The visualized portions of the lungs are clear. Hepatobiliary: The liver is normal in  density without focal abnormality.The main portal vein is patent. The patient is status post cholecystectomy. No biliary ductal dilation. Pancreas: Unremarkable. No pancreatic ductal dilatation or surrounding inflammatory changes. Spleen: Normal in size without focal abnormality. Adrenals/Urinary Tract: Both adrenal glands appear normal. There are bilateral low-density lesions seen within both kidneys. The largest in the upper pole of the left kidney measuring 1.5 cm, likely simple renal cysts. Stomach/Bowel: The stomach is normal in appearance. There is a duodenal diverticulum with retained food stuff seen within the third portion of the duodenum. The remainder of the small bowel is unremarkable.Surgical clips seen in the right lower quadrant. There is a 9 cm segment of sigmoid colonic diverticulitis with diffuse bowel wall thickening and surrounding extensive mesenteric fat stranding changes. Small amount of pneumoperitoneum is seen adjacent to the sigmoid colon. There is a multilocular collection extending around the sigmoid colon best seen on series 2, image 64, measuring approximately 7.0 x 2.7 x 3.6 cm. The collection has fluid and small foci of air and is peripherally enhancing. Vascular/Lymphatic: There are no enlarged mesenteric, retroperitoneal, or pelvic lymph nodes. No significant vascular findings are present.  Reproductive: The uterus and adnexa are unremarkable. there is free fluid seen within the cul-de-sac.1 Other: No evidence of abdominal wall mass or hernia. Musculoskeletal: No acute or significant osseous findings. IMPRESSION: Acute sigmoid colonic diverticulitis with perforation and multilocular pericolonic abscess measuring 7.0 x 2.7 x3.6 cm. These results were called by telephone at the time of interpretation on 10/20/2018 at 8:22 pm to Martinique Robinson PA, who verbally acknowledged these results. Electronically Signed   By: Kaitlyn Harper M.D.   On: 10/20/2018 20:26   Scheduled Meds:  potassium chloride  40 mEq Oral BID   Continuous Infusions:  sodium chloride 250 mL (10/20/18 2104)   0.9 % NaCl with KCl 40 mEq / L 75 mL/hr (10/22/18 0759)   lactated ringers     methocarbamol (ROBAXIN) IV     ondansetron (ZOFRAN) IV     piperacillin-tazobactam (ZOSYN)  IV 3.375 g (10/22/18 0517)    LOS: 2 days   Kerney Elbe, DO [facility] PAGER is on [place]  If 7PM-7AM, please contact night-coverage [email or web address] Password [facility] 10/22/2018, 8:36 AM

## 2018-10-22 NOTE — Progress Notes (Signed)
[facility]/Trauma Progress Note      Assessment/Plan HTN Type 2 diabetes Depression Anxiety  Acute sigmoid diverticulitis with perforation and pericolonic abscess 7X2.7X 3.6cm - IR unable to drain this collection, no safe window.  They recommend follow-up CT in a few days - Hopefully they will be able to drain this abscess in a few days and we can avoid surgery in this patient. - We will follow as patient will likely need surgery if she is unable to have this abscess drained - Continue IV antibiotics and bowel rest - Ambulate  FEN: N.p.o. with sips, IVF VTE: SCD's ID: Zosyn 09/07>> Foley: None Follow up: TBD   LOS: 2 days    Subjective: CC: Abdominal pain  Pain about the same as yesterday.  Patient is not requiring pain medication.  She denies nausea vomiting.  She is having flatus but no BMs.  Objective: Vital signs in last 24 hours: Temp:  [98.3 F (36.8 C)-99.4 F (37.4 C)] 99.4 F (37.4 C) (09/09 0610) Pulse Rate:  [70-76] 76 (09/09 0610) Resp:  [17-18] 17 (09/09 0610) BP: (125-136)/(70-75) 135/75 (09/09 0610) SpO2:  [97 %-100 %] 98 % (09/09 0610) Last BM Date: 10/19/18  Intake/Output from previous day: 09/08 0701 - 09/09 0700 In: 1371.7 [P.O.:240; I.V.:1042.8; IV Piggyback:89] Out: [id number] [Urine:1350] Intake/Output this shift: Total I/O In: 1183.8 [I.V.:1183.8] Out: -   PE: PE: Gen:  Alert, NAD, pleasant, cooperative, well-appearing Pulm: Rate and effort normal Abd: Soft, ND, +BS, TTP of suprapubic region with guarding, no peritonitis Skin: no rashes noted, warm and dry   Anti-infectives: Anti-infectives (From admission, onward)   Start     Dose/Rate Route Frequency Ordered Stop   10/20/18 2115  piperacillin-tazobactam (ZOSYN) IVPB 3.375 g     3.375 g 12.5 mL/hr over 240 Minutes Intravenous Every 8 hours 10/20/18 2107     10/20/18 2100  piperacillin-tazobactam (ZOSYN) IVPB 3.375 g  Status:  Discontinued     3.375 g 100 mL/hr over  30 Minutes Intravenous  Once 10/20/18 2052 10/21/18 0105      Lab Results:  Recent Labs    10/21/18 0300 10/22/18 0819  WBC 19.9* 17.2*  HGB 11.4* 11.2*  HCT 34.1* 34.6*  PLT 350 382   BMET Recent Labs    10/21/18 0300 10/22/18 0844  NA 139 140  K 3.2* 4.2  CL 108 111  CO2 21* 21*  GLUCOSE 109* 90  BUN 11 9  CREATININE 0.81 0.89  CALCIUM 8.5* 8.9   PT/INR Recent Labs    10/20/18 2138  LABPROT 14.5  INR 1.1   CMP     Component Value Date/Time   NA 140 10/22/2018 0844   K 4.2 10/22/2018 0844   CL 111 10/22/2018 0844   CO2 21 (L) 10/22/2018 0844   GLUCOSE 90 10/22/2018 0844   BUN 9 10/22/2018 0844   CREATININE 0.89 10/22/2018 0844   CREATININE 1.03 04/19/2017 1617   CALCIUM 8.9 10/22/2018 0844   PROT 7.1 10/22/2018 0844   ALBUMIN 3.1 (L) 10/22/2018 0844   AST 45 (H) 10/22/2018 0844   ALT 67 (H) 10/22/2018 0844   ALKPHOS 164 (H) 10/22/2018 0844   BILITOT 1.8 (H) 10/22/2018 0844   GFRNONAA >60 10/22/2018 0844   GFRNONAA 61 08/25/2013 1129   GFRAA >60 10/22/2018 0844   GFRAA 71 08/25/2013 1129   Lipase  No results found for: LIPASE  Studies/Results: Ct Abdomen Pelvis W Contrast  Result Date: 10/20/2018 CLINICAL DATA:  Abdominal pain EXAM:  CT ABDOMEN AND PELVIS WITH CONTRAST TECHNIQUE: Multidetector CT imaging of the abdomen and pelvis was performed using the standard protocol following bolus administration of intravenous contrast. CONTRAST:  134mL OMNIPAQUE IOHEXOL 300 MG/ML  SOLN COMPARISON:  None. FINDINGS: Lower chest: The visualized heart size within normal limits. No pericardial fluid/thickening. No hiatal hernia. The visualized portions of the lungs are clear. Hepatobiliary: The liver is normal in density without focal abnormality.The main portal vein is patent. The patient is status post cholecystectomy. No biliary ductal dilation. Pancreas: Unremarkable. No pancreatic ductal dilatation or surrounding inflammatory changes. Spleen: Normal in size without  focal abnormality. Adrenals/Urinary Tract: Both adrenal glands appear normal. There are bilateral low-density lesions seen within both kidneys. The largest in the upper pole of the left kidney measuring 1.5 cm, likely simple renal cysts. Stomach/Bowel: The stomach is normal in appearance. There is a duodenal diverticulum with retained food stuff seen within the third portion of the duodenum. The remainder of the small bowel is unremarkable.Surgical clips seen in the right lower quadrant. There is a 9 cm segment of sigmoid colonic diverticulitis with diffuse bowel wall thickening and surrounding extensive mesenteric fat stranding changes. Small amount of pneumoperitoneum is seen adjacent to the sigmoid colon. There is a multilocular collection extending around the sigmoid colon best seen on series 2, image 64, measuring approximately 7.0 x 2.7 x 3.6 cm. The collection has fluid and small foci of air and is peripherally enhancing. Vascular/Lymphatic: There are no enlarged mesenteric, retroperitoneal, or pelvic lymph nodes. No significant vascular findings are present. Reproductive: The uterus and adnexa are unremarkable. there is free fluid seen within the cul-de-sac.1 Other: No evidence of abdominal wall mass or hernia. Musculoskeletal: No acute or significant osseous findings. IMPRESSION: Acute sigmoid colonic diverticulitis with perforation and multilocular pericolonic abscess measuring 7.0 x 2.7 x3.6 cm. These results were called by telephone at the time of interpretation on 10/20/2018 at 8:22 pm to Martinique Robinson PA, who verbally acknowledged these results. Electronically Signed   By: [name] M.D.   On: 10/20/2018 20:26      Kalman Drape , [facility] 10/22/2018, 9:47 AM  Pager: [phone] Mon-Wed, Friday 7:00am-4:30pm Thurs 7am-11:30am  Consults: [phone]

## 2018-10-23 LAB — COMPREHENSIVE METABOLIC PANEL
ALT: 53 U/L — ABNORMAL HIGH (ref 0–44)
AST: 32 U/L (ref 15–41)
Albumin: 3 g/dL — ABNORMAL LOW (ref 3.5–5.0)
Alkaline Phosphatase: 142 U/L — ABNORMAL HIGH (ref 38–126)
Anion gap: 11 (ref 5–15)
BUN: 8 mg/dL (ref 6–20)
CO2: 17 mmol/L — ABNORMAL LOW (ref 22–32)
Calcium: 8.3 mg/dL — ABNORMAL LOW (ref 8.9–10.3)
Chloride: 111 mmol/L (ref 98–111)
Creatinine, Ser: 0.84 mg/dL (ref 0.44–1.00)
GFR calc Af Amer: >60 mL/min (ref >60)
GFR calc non Af Amer: >60 mL/min (ref >60)
Glucose, Bld: 125 mg/dL — ABNORMAL HIGH (ref 70–99)
Potassium: 4.1 mmol/L (ref 3.5–5.1)
Sodium: 139 mmol/L (ref 135–145)
Total Bilirubin: 1.5 mg/dL — ABNORMAL HIGH (ref 0.3–1.2)
Total Protein: 7.3 g/dL (ref 6.5–8.1)

## 2018-10-23 LAB — IRON AND TIBC
Iron: 31 ug/dL (ref 28–170)
Saturation Ratios: 15 % (ref 10.4–31.8)
TIBC: 208 ug/dL — ABNORMAL LOW (ref 250–450)
UIBC: 177 ug/dL

## 2018-10-23 LAB — CBC WITH DIFFERENTIAL/PLATELET
Abs Immature Granulocytes: 0.42 10*3/uL — ABNORMAL HIGH (ref 0.00–0.07)
Basophils Absolute: 0.1 10*3/uL (ref 0.0–0.1)
Basophils Relative: 0 %
Eosinophils Absolute: 0.1 10*3/uL (ref 0.0–0.5)
Eosinophils Relative: 0 %
HCT: 33 % — ABNORMAL LOW (ref 36.0–46.0)
Hemoglobin: 10.9 g/dL — ABNORMAL LOW (ref 12.0–15.0)
Immature Granulocytes: 3 %
Lymphocytes Relative: 13 %
Lymphs Abs: 2 10*3/uL (ref 0.7–4.0)
MCH: 31.6 pg (ref 26.0–34.0)
MCHC: 33 g/dL (ref 30.0–36.0)
MCV: 95.7 fL (ref 80.0–100.0)
Monocytes Absolute: 1.2 10*3/uL — ABNORMAL HIGH (ref 0.1–1.0)
Monocytes Relative: 7 %
Neutro Abs: 12.1 10*3/uL — ABNORMAL HIGH (ref 1.7–7.7)
Neutrophils Relative %: 77 %
Platelets: 336 10*3/uL (ref 150–400)
RBC: 3.45 MIL/uL — ABNORMAL LOW (ref 3.87–5.11)
RDW: 14 % (ref 11.5–15.5)
WBC: 15.9 10*3/uL — ABNORMAL HIGH (ref 4.0–10.5)
nRBC: 0 % (ref 0.0–0.2)

## 2018-10-23 LAB — FERRITIN: Ferritin: 279 ng/mL (ref 11–307)

## 2018-10-23 LAB — VITAMIN B12: Vitamin B-12: 2845 pg/mL — ABNORMAL HIGH (ref 180–914)

## 2018-10-23 LAB — PHOSPHORUS: Phosphorus: 2.9 mg/dL (ref 2.5–4.6)

## 2018-10-23 LAB — GLUCOSE, CAPILLARY: Glucose-Capillary: 105 mg/dL — ABNORMAL HIGH (ref 70–99)

## 2018-10-23 LAB — RETICULOCYTES
Immature Retic Fract: 15.6 % (ref 2.3–15.9)
RBC.: 3.45 MIL/uL — ABNORMAL LOW (ref 3.87–5.11)
Retic Count, Absolute: 51.4 10*3/uL (ref 19.0–186.0)
Retic Ct Pct: 1.5 % (ref 0.4–3.1)

## 2018-10-23 LAB — FOLATE: Folate: 15.3 ng/mL (ref >5.9)

## 2018-10-23 LAB — MAGNESIUM: Magnesium: 2 mg/dL (ref 1.7–2.4)

## 2018-10-23 MED ORDER — AMLODIPINE BESYLATE 10 MG PO TABS
10.0000 mg | ORAL_TABLET | Freq: Every day | ORAL | Status: DC
  Administered 2018-10-23 – 2018-10-30 (×8): 10 mg via ORAL
  Filled 2018-10-23 (×8): qty 1

## 2018-10-23 MED ORDER — SODIUM BICARBONATE-DEXTROSE 150-5 MEQ/L-% IV SOLN
150.0000 meq | INTRAVENOUS | Status: DC
  Administered 2018-10-23 – 2018-10-24 (×3): 150 meq via INTRAVENOUS
  Filled 2018-10-23 (×3): qty 1000

## 2018-10-23 MED ORDER — ENOXAPARIN SODIUM 40 MG/0.4ML ~~LOC~~ SOLN
40.0000 mg | SUBCUTANEOUS | Status: DC
  Administered 2018-10-23 – 2018-10-24 (×2): 40 mg via SUBCUTANEOUS
  Filled 2018-10-23 (×2): qty 0.4

## 2018-10-23 MED ORDER — IRBESARTAN 300 MG PO TABS
300.0000 mg | ORAL_TABLET | Freq: Every day | ORAL | Status: DC
  Administered 2018-10-23 – 2018-10-30 (×8): 300 mg via ORAL
  Filled 2018-10-23 (×8): qty 1

## 2018-10-23 MED ORDER — CARVEDILOL 6.25 MG PO TABS
6.2500 mg | ORAL_TABLET | Freq: Two times a day (BID) | ORAL | Status: DC
  Administered 2018-10-23 – 2018-10-29 (×12): 6.25 mg via ORAL
  Filled 2018-10-23 (×12): qty 1

## 2018-10-23 MED ORDER — AMLODIPINE BESYLATE-VALSARTAN 10-320 MG PO TABS: 1.0000 | ORAL_TABLET | Freq: Every day | ORAL | Status: DC

## 2018-10-23 NOTE — Progress Notes (Signed)
[facility]/Trauma Progress Note      Assessment/Plan HTN Type 2 diabetes Depression Anxiety  Acute sigmoid diverticulitis with perforation and pericolonic abscess 7X2.7X 3.6cm -IR unable to drain this collection,no safe window. They recommend follow-up CT in a few days -Hopefully they will be able to drain this abscess in a few days and we can avoid surgery in this patient. We will follow as patient will likely need surgery if she is unable to have this abscess drained -Continue IV antibiotics and bowel rest -Ambulate - CT scan Friday  FEN:CLD, IVF VTE: SCD's, Lovenox EB:[id number] Foley:None Follow up:TBD   LOS: 3 days    Subjective: CC: Abdominal pain  Pain is about the same.  She states she still does not need pain medicine for this.  Pain is not worse with ambulation.  Chills overnight.  No nausea or vomiting.  Objective: Vital signs in last 24 hours: Temp:  [99 F (37.2 C)-99.5 F (37.5 C)] 99.5 F (37.5 C) (09/10 0552) Pulse Rate:  [58-71] 58 (09/10 0552) Resp:  [17] 17 (09/10 0552) BP: (139-150)/(66-77) 145/72 (09/10 0552) SpO2:  [96 %-100 %] 96 % (09/10 0552) Weight:  [70.6 kg] 70.6 kg (09/10 0552) Last BM Date: 10/19/18  Intake/Output from previous day: 09/09 0701 - 09/10 0700 In: 2953.5 [P.O.:120; I.V.:2737.6; IV Piggyback:95.8] Out: 2500 [Urine:2500] Intake/Output this shift: No intake/output data recorded.  PE: Gen: Alert, NAD, pleasant, cooperative, well-appearing Pulm:Rate andeffort normal Abd: Soft, ND, +BS,TTP of suprapubic region without guarding, no peritonitis Skin: no rashes noted, warm and dry   Anti-infectives: Anti-infectives (From admission, onward)   Start     Dose/Rate Route Frequency Ordered Stop   10/20/18 2115  piperacillin-tazobactam (ZOSYN) IVPB 3.375 g     3.375 g 12.5 mL/hr over 240 Minutes Intravenous Every 8 hours 10/20/18 2107     10/20/18 2100  piperacillin-tazobactam (ZOSYN) IVPB  3.375 g  Status:  Discontinued     3.375 g 100 mL/hr over 30 Minutes Intravenous  Once 10/20/18 2052 10/21/18 0105      Lab Results:  Recent Labs    10/22/18 0819 10/23/18 0335  WBC 17.2* 15.9*  HGB 11.2* 10.9*  HCT 34.6* 33.0*  PLT 382 336   BMET Recent Labs    10/22/18 0844 10/23/18 0335  NA 140 139  K 4.2 4.1  CL 111 111  CO2 21* 17*  GLUCOSE 90 125*  BUN 9 8  CREATININE 0.89 0.84  CALCIUM 8.9 8.3*   PT/INR Recent Labs    10/20/18 2138  LABPROT 14.5  INR 1.1   CMP     Component Value Date/Time   NA 139 10/23/2018 0335   K 4.1 10/23/2018 0335   CL 111 10/23/2018 0335   CO2 17 (L) 10/23/2018 0335   GLUCOSE 125 (H) 10/23/2018 0335   BUN 8 10/23/2018 0335   CREATININE 0.84 10/23/2018 0335   CREATININE 1.03 04/19/2017 1617   CALCIUM 8.3 (L) 10/23/2018 0335   PROT 7.3 10/23/2018 0335   ALBUMIN 3.0 (L) 10/23/2018 0335   AST 32 10/23/2018 0335   ALT 53 (H) 10/23/2018 0335   ALKPHOS 142 (H) 10/23/2018 0335   BILITOT 1.5 (H) 10/23/2018 0335   GFRNONAA >60 10/23/2018 0335   GFRNONAA 61 08/25/2013 1129   GFRAA >60 10/23/2018 0335   GFRAA 71 08/25/2013 1129   Lipase  No results found for: LIPASE  Studies/Results: No results found.    Kalman Drape , [facility] 10/23/2018, 9:34 AM  Pager: [phone] Mon-Wed, Friday 7:00am-4:30pm Thurs 7am-11:30am  Consults: [phone]

## 2018-10-23 NOTE — Progress Notes (Signed)
PROGRESS NOTE    Kaitlyn Harper  [id number] DOB: 1960/06/08 DOA: 10/20/2018 PCP: Debbrah Alar, NP   Brief Narrative:  HPI per Dr. Mitzi Hansen on 10/20/2018 Kaitlyn Harper is a 58 y.o. female with medical history significant for hypertension, diet-controlled diabetes mellitus, hyperlipidemia, depression, and anxiety, now presenting to the emergency department for evaluation of abdominal pain.  Patient developed lower abdominal discomfort, fevers, chills, and nausea without vomiting approximately 1 week ago, was suspected to have UTI, was given a dose of Rocephin and started on Levaquin on 10/16/2018, continued to feel poorly with ongoing lower abdominal pain, was noted to have leukocytosis greater than 20,000 on outpatient blood work, and was directed to the ED for further evaluation of this.  She has had some loose stools without any melena or hematochezia, has nausea and loss of appetite, but no vomiting.  She had never experienced these symptoms previously.  Denies cough or shortness of breath.  [facility] ED Course: Upon arrival to the ED, patient is found to be afebrile, saturating well on room air, slightly tachycardic, and with stable blood pressure.  Chemistry panel is notable for potassium of 3.0 and mild elevation in transaminases.  CBC features a leukocytosis to 20,800.  CT of the abdomen and pelvis is concerning for acute sigmoid diverticulitis with perforation and multilocular pericolonic abscess.  COVID-19 is negative. Patient was given a liter of lactated Ringer's, Zofran, Dilaudid, and Zosyn in the emergency department.  Surgery was consulted by the ED physician and recommended medical admission.  **Interim History  Surgery evaluated and recommended IR to evaluate for possible drain placement however there is no safe window for drain to be placed and they recommended a follow-up CT in a few days.  We will continue n.p.o. for now with IV antibiotics and bowel  rest and recommending ambulating.  General surgery still hope and patient to have abscess drained after repeat CT scan a few days and they will continue to follow this patient if he is unable to have the abscess drained.  Patient is having flatus but currently having a bowel movement.  Continues to have some pain but is improved.  General surgery has advance her diet to clear liquid diet and they are ordering a CT scan for the a.m.  Assessment & Plan:   Principal Problem:   Diverticulitis of large intestine with perforation and abscess Active Problems:   Essential hypertension   Hyperlipidemia   Tobacco abuse   Hypokalemia   Impaired glucose tolerance   Depressive disorder   Anemia   Anxiety   AV block, 1st degree   Acute Diverticulitis with perforation and abscess -Presents with persistent lower abdominal pain, fevers, and nausea despite treatment for suspected UTI, and is found to have acute sigmoid diverticulitis with perforation and abscess -She has been hemodynamically stable -Surgery is consulting and appreciate further evaluation and recommendations -IR has been consulted and today said that they are unable to safely drain the abscess due to it's positioning -Temps improving 101 and TMax wa 99.5, WBC slightly down 22.6 -> 17.2 -> 15.9 -Diet was advanced to clear liquid diet and IV fluids have been now changed to sodium bicarbonate 150 mEq in D5 rate of 75 mL's per hour -IV Zosyn for Abx Coverage and with Pain-control with IV Hydromorphone 0.5-1 mg IV q3hprn Moderate and Severe Pain and also continue with IV methocarbamol 1000 mg every 6 hours PRN for muscle spasm -IVF hydration with NS + 40 mEQ  KCl at 75 mL/hr now stopped and changed to sodium bicarbonate 150 mcg and D5W at 75 mLper hour -Antiemeticswith IV Zofran  -Further care per general surgery and appreciate further recommendations; patient to get lactated Ringer boluses 1 L every 8 hours PRN for systolic blood pressure  less than 90 or urine output less than 250 mL's every 8 hours for 2 days -General Surgery are planning to repeat the CT scan of the Abdomen/Pelvis tomorrow on Friday, 10/24/2018 -Diet advanced per general surgery and she is now on a clear liquid diet  Hypertension -BP at goal in 120/80 range on admission but now currently 145/72 -Continuef holding home ARB/ CCB and coregas she was was not taking anything by p.o. but will resume now that she is on a clear liquid diet -Use labetalol IVP's as needed while NPO; currently on IV labetalol 10 mg every 2 as needed for systolic blood pressure around [id number] or diastolic pressure than [id number]  Type II DM -Diet-controlled at home, serum glucose is 108 in EDand repeat this morning was125 - BS's are stable and CBGs have been ranging from 84-110 and dated blood sugars have been ranging from 90-109 on PPI/therapy -Monitoring CBG's qam, holding off on SSI for now and will continue to monitor carefully  Hypokalemia -Serum potassium 3.0 in EDand now improved to 4.1 this AM - Continue to monitor replete as necessary - Continue to trend and repeat CMP in a.m.  Abnormal LFTs -Unclear Etiology but suspect in the setting of Diverticulitis and trending down -Patient's AST went from 67 -> 47 -> 45 -Patient's ALT went from 92 -> 74 -> 67 -Continue to Monitor and Trend and if worsening or not improving will obtain a RUQ U/S and a Acute Hepatitis Panel -Continue to Hold Statin  -Repeat CMP in AM   Hyperbilirubinemia, improving -Likely reactive -Patient's T bili 0.9 is now 1.8 -> 1.5  -Continue to monitor and Trend -Repeat CMP in a.m.  Overweight -Estimated body mass index is 26.73 kg/m as calculated from the following:   Height as of this encounter: 5\' 4"  (1.626 m).   Weight as of this encounter: 70.6 kg. -Weight Loss and Dietary Counseling given   Metabolic Acidosis, worsened  -Pateint's CO2 was 21, AG was 10 and Chlroide was 108; Now CO2 is 17,  AG is 11, and Chloride is 111 -Changed IVF to Sodium Bicarbonate 150 mEQ in D5W at 75 mL/hr -Continue to Monitor and Trend -Repeat CMP in AM   Normocytic Anemia, slightly decreasing  -Patient's Hb/Hct went from 12.8/38.9 -> 11.4/34.1 -> 11.2/34.6 -> 10.9/33.0 -Checked Anemia Panel and showed an iron level of 31, U IBC 177, TIBC of 208, saturation ratios of 15%, ferritin level 279, folate level 15.3, and vitamin B12 level of 2845 -Continue to Monitor for S/Sx of Bleeding; Currently no overt Bleeding noted -Repeat CBC in AM   DVT prophylaxis: SCDs Code Status: FULL CODE Family Communication: No family present at bedside  Disposition Plan: Pending improvement and clearance by General Surgery  Consultants:   General Surgery  Interventional Radiology    Procedures: None  Antimicrobials:  Anti-infectives (From admission, onward)   Start     Dose/Rate Route Frequency Ordered Stop   10/20/18 2115  piperacillin-tazobactam (ZOSYN) IVPB 3.375 g     3.375 g 12.5 mL/hr over 240 Minutes Intravenous Every 8 hours 10/20/18 2107     10/20/18 2100  piperacillin-tazobactam (ZOSYN) IVPB 3.375 g  Status:  Discontinued     3.375 g 100  mL/hr over 30 Minutes Intravenous  Once 10/20/18 2052 10/21/18 0105     Subjective: Seen and examined at bedside and states that her abdominal pain is improving.  States that she has minimal abdominal pain she is passing flatus.  No chest pain, lightheadedness or dizziness.  No nausea or vomiting.  No other concerns or complaints at this time and hopeful that she can get a drain pending her CT scan results.  Objective: Vitals:   10/22/18 0610 10/22/18 1331 10/22/18 2028 10/23/18 0552  BP: 135/75 (!) 150/66 139/77 (!) 145/72  Pulse: 76 71 69 (!) 58  Resp: 17 17 17 17   Temp: 99.4 F (37.4 C) 99 F (37.2 C) 99.5 F (37.5 C) 99.5 F (37.5 C)  TempSrc: Oral Oral Oral Oral  SpO2: 98% 100% 98% 96%  Weight:    70.6 kg  Height:        Intake/Output Summary  (Last 24 hours) at 10/23/2018 [id number] Last data filed at 10/23/2018 0630 Gross per 24 hour  Intake 2953.47 ml  Output 2500 ml  Net 453.47 ml   Filed Weights   10/20/18 1621 10/23/18 0552  Weight: 70 kg 70.6 kg   Examination: Physical Exam:  Constitutional: WN/WD overweight African-American female currently in NAD and appears calm and comfortable Eyes:  Lids and conjunctivae normal, sclerae anicteric  ENMT: External Ears, Nose appear normal. Grossly normal hearing. Mucous membranes are moist.  Neck: Appears normal, supple, no cervical masses, normal ROM, no appreciable thyromegaly; no JVD Respiratory: Diminished to auscultation bilaterally, no wheezing, rales, rhonchi or crackles. Normal respiratory effort and patient is not tachypenic. No accessory muscle use.  Cardiovascular: RRR, no murmurs / rubs / gallops. S1 and S2 auscultated. No extremity edema.  Abdomen: Soft, mildly tender, Distended slightly. No masses palpated. No appreciable hepatosplenomegaly. Bowel sounds positive x4.  GU: Deferred. Musculoskeletal: No clubbing / cyanosis of digits/nails. No joint deformity upper and lower extremities.  Skin: No rashes, lesions, ulcers on a limited skin evaluation. No induration; Warm and dry.  Neurologic: CN 2-12 grossly intact with no focal deficits. Romberg sign cerebellar reflexes not assessed.  Psychiatric: Normal judgment and insight. Alert and oriented x 3. Normal mood and appropriate affect.   Data Reviewed: I have personally reviewed following labs and imaging studies  CBC: Recent Labs  Lab 10/16/18 1731 10/20/18 1942 10/21/18 0300 10/22/18 0819 10/23/18 0335  WBC 22.6* 20.8* 19.9* 17.2* 15.9*  NEUTROABS 19.5* 15.3* 14.0*  --  12.1*  HGB 14.2 12.8 11.4* 11.2* 10.9*  HCT 41.1 38.9 34.1* 34.6* 33.0*  MCV 92.8 93.1 94.2 96.9 95.7  PLT 254 366 350 382 [id number]   Basic Metabolic Panel: Recent Labs  Lab 10/16/18 1731 10/20/18 1942 10/21/18 0300 10/22/18 0844 10/23/18 0335    NA 138 138 139 140 139  K 3.3* 3.0* 3.2* 4.2 4.1  CL 103 104 108 111 111  CO2 22 22 21* 21* 17*  GLUCOSE 110* 108* 109* 90 125*  BUN 10 13 11 9 8   CREATININE 1.10* 0.94 0.81 0.89 0.84  CALCIUM 9.2 9.1 8.5* 8.9 8.3*  MG  --   --  1.9 2.1 2.0  PHOS  --   --   --  3.1 2.9   GFR: Estimated Creatinine Clearance: 68.7 mL/min (by C-G formula based on SCr of 0.84 mg/dL). Liver Function Tests: Recent Labs  Lab 10/20/18 1942 10/21/18 0300 10/22/18 0844 10/23/18 0335  AST 67* 47* 45* 32  ALT 92* 74* 67* 53*  ALKPHOS 164*  137* 164* 142*  BILITOT 0.9 1.2 1.8* 1.5*  PROT 7.9 6.7 7.1 7.3  ALBUMIN 3.5 3.1* 3.1* 3.0*   No results for input(s): LIPASE, AMYLASE in the last 168 hours. No results for input(s): AMMONIA in the last 168 hours. Coagulation Profile: Recent Labs  Lab 10/20/18 2138  INR 1.1   Cardiac Enzymes: No results for input(s): CKTOTAL, CKMB, CKMBINDEX, TROPONINI in the last 168 hours. BNP (last 3 results) No results for input(s): PROBNP in the last 8760 hours. HbA1C: No results for input(s): HGBA1C in the last 72 hours. CBG: Recent Labs  Lab 10/21/18 0721 10/22/18 0733 10/23/18 0809  GLUCAP 110* 84 105*   Lipid Profile: No results for input(s): CHOL, HDL, LDLCALC, TRIG, CHOLHDL, LDLDIRECT in the last 72 hours. Thyroid Function Tests: No results for input(s): TSH, T4TOTAL, FREET4, T3FREE, THYROIDAB in the last 72 hours. Anemia Panel: Recent Labs    10/23/18 0335  VITAMINB12 2,845*  FOLATE 15.3  FERRITIN 279  TIBC 208*  IRON 31  RETICCTPCT 1.5   Sepsis Labs: No results for input(s): PROCALCITON, LATICACIDVEN in the last 168 hours.  Recent Results (from the past 240 hour(s))  Urine culture     Status: Abnormal   Collection Time: 10/16/18  5:58 PM   Specimen: Urine, Clean Catch  Result Value Ref Range Status   Specimen Description URINE, CLEAN CATCH  Final   Special Requests NONE  Final   Culture (A)  Final    <10,000 COLONIES/mL INSIGNIFICANT  GROWTH Performed at [facility], [address] [place],  [address]    Report Status 10/17/2018 FINAL  Final  SARS Coronavirus 2 [facility] order, Performed in [facility] hospital lab) Nasopharyngeal Nasopharyngeal Swab     Status: None   Collection Time: 10/20/18  9:51 PM   Specimen: Nasopharyngeal Swab  Result Value Ref Range Status   SARS Coronavirus 2 NEGATIVE NEGATIVE Final    Comment: (NOTE) If result is NEGATIVE SARS-CoV-2 target nucleic acids are NOT DETECTED. The SARS-CoV-2 RNA is generally detectable in upper and lower  respiratory specimens during the acute phase of infection. The lowest  concentration of SARS-CoV-2 viral copies this assay can detect is 250  copies / mL. A negative result does not preclude SARS-CoV-2 infection  and should not be used as the sole basis for treatment or other  patient management decisions.  A negative result may occur with  improper specimen collection / handling, submission of specimen other  than nasopharyngeal swab, presence of viral mutation(s) within the  areas targeted by this assay, and inadequate number of viral copies  (<250 copies / mL). A negative result must be combined with clinical  observations, patient history, and epidemiological information. If result is POSITIVE SARS-CoV-2 target nucleic acids are DETECTED. The SARS-CoV-2 RNA is generally detectable in upper and lower  respiratory specimens dur ing the acute phase of infection.  Positive  results are indicative of active infection with SARS-CoV-2.  Clinical  correlation with patient history and other diagnostic information is  necessary to determine patient infection status.  Positive results do  not rule out bacterial infection or co-infection with other viruses. If result is PRESUMPTIVE POSTIVE SARS-CoV-2 nucleic acids MAY BE PRESENT.   A presumptive positive result was obtained on the submitted specimen  and confirmed on repeat testing.  While 2019  novel coronavirus  (SARS-CoV-2) nucleic acids may be present in the submitted sample  additional confirmatory testing may be necessary for epidemiological  and / or  clinical management purposes  to differentiate between  SARS-CoV-2 and other Sarbecovirus currently known to infect humans.  If clinically indicated additional testing with an alternate test  methodology [phone]) is advised. The SARS-CoV-2 RNA is generally  detectable in upper and lower respiratory sp ecimens during the acute  phase of infection. The expected result is Negative. Fact Sheet for Patients:  StrictlyIdeas.no Fact Sheet for Healthcare Providers: BankingDealers.co.za This test is not yet approved or cleared by the [place] FDA and has been authorized for detection and/or diagnosis of SARS-CoV-2 by FDA under an Emergency Use Authorization (EUA).  This EUA will remain in effect (meaning this test can be used) for the duration of the COVID-19 declaration under Section 564(b)(1) of the Act, 21 U.S.C. section 360bbb-3(b)(1), unless the authorization is terminated or revoked sooner. Performed at [facility], [address], [place], [place] [address]     Radiology Studies: No results found. Scheduled Meds:  Continuous Infusions:  sodium chloride 250 mL (10/20/18 2104)   methocarbamol (ROBAXIN) IV     ondansetron (ZOFRAN) IV     piperacillin-tazobactam (ZOSYN)  IV 3.375 g (10/23/18 0630)   sodium bicarbonate 150 mEq in dextrose 5% 1000 mL      LOS: 3 days   Kerney Elbe, DO [facility] PAGER is on [place]  If 7PM-7AM, please contact night-coverage [email or web address] Password [facility] 10/23/2018, 8:38 AM

## 2018-10-24 ENCOUNTER — Encounter (HOSPITAL_COMMUNITY): Payer: Self-pay | Admitting: Radiology

## 2018-10-24 ENCOUNTER — Inpatient Hospital Stay (HOSPITAL_COMMUNITY): Payer: 59

## 2018-10-24 LAB — CBC
HCT: 33.4 % — ABNORMAL LOW (ref 36.0–46.0)
Hemoglobin: 11.2 g/dL — ABNORMAL LOW (ref 12.0–15.0)
MCH: 31.5 pg (ref 26.0–34.0)
MCHC: 33.5 g/dL (ref 30.0–36.0)
MCV: 94.1 fL (ref 80.0–100.0)
Platelets: 360 10*3/uL (ref 150–400)
RBC: 3.55 MIL/uL — ABNORMAL LOW (ref 3.87–5.11)
RDW: 13.4 % (ref 11.5–15.5)
WBC: 14.5 10*3/uL — ABNORMAL HIGH (ref 4.0–10.5)
nRBC: 0 % (ref 0.0–0.2)

## 2018-10-24 LAB — COMPREHENSIVE METABOLIC PANEL
ALT: 47 U/L — ABNORMAL HIGH (ref 0–44)
AST: 35 U/L (ref 15–41)
Albumin: 3.1 g/dL — ABNORMAL LOW (ref 3.5–5.0)
Alkaline Phosphatase: 127 U/L — ABNORMAL HIGH (ref 38–126)
Anion gap: 11 (ref 5–15)
BUN: <5 mg/dL — ABNORMAL LOW (ref 6–20)
CO2: 25 mmol/L (ref 22–32)
Calcium: 8.3 mg/dL — ABNORMAL LOW (ref 8.9–10.3)
Chloride: 100 mmol/L (ref 98–111)
Creatinine, Ser: 0.83 mg/dL (ref 0.44–1.00)
GFR calc Af Amer: >60 mL/min (ref >60)
GFR calc non Af Amer: >60 mL/min (ref >60)
Glucose, Bld: 136 mg/dL — ABNORMAL HIGH (ref 70–99)
Potassium: 3.1 mmol/L — ABNORMAL LOW (ref 3.5–5.1)
Sodium: 136 mmol/L (ref 135–145)
Total Bilirubin: 1 mg/dL (ref 0.3–1.2)
Total Protein: 7.3 g/dL (ref 6.5–8.1)

## 2018-10-24 LAB — PHOSPHORUS: Phosphorus: 3.2 mg/dL (ref 2.5–4.6)

## 2018-10-24 LAB — MAGNESIUM: Magnesium: 2 mg/dL (ref 1.7–2.4)

## 2018-10-24 LAB — GLUCOSE, CAPILLARY: Glucose-Capillary: 137 mg/dL — ABNORMAL HIGH (ref 70–99)

## 2018-10-24 MED ORDER — IOHEXOL 300 MG/ML  SOLN: 30.0000 mL | Freq: Once | INTRAMUSCULAR | Status: DC | PRN

## 2018-10-24 MED ORDER — SODIUM CHLORIDE (PF) 0.9 % IJ SOLN
INTRAMUSCULAR | Status: AC
  Filled 2018-10-24: qty 50

## 2018-10-24 MED ORDER — IOHEXOL 300 MG/ML  SOLN
100.0000 mL | Freq: Once | INTRAMUSCULAR | Status: AC | PRN
  Administered 2018-10-24: 100 mL via INTRAVENOUS

## 2018-10-24 MED ORDER — POTASSIUM CHLORIDE CRYS ER 20 MEQ PO TBCR
40.0000 meq | EXTENDED_RELEASE_TABLET | Freq: Two times a day (BID) | ORAL | Status: AC
  Administered 2018-10-24 (×2): 40 meq via ORAL
  Filled 2018-10-24 (×2): qty 2

## 2018-10-24 MED ORDER — ENOXAPARIN SODIUM 40 MG/0.4ML ~~LOC~~ SOLN
40.0000 mg | SUBCUTANEOUS | Status: DC
  Administered 2018-10-26 – 2018-10-29 (×4): 40 mg via SUBCUTANEOUS
  Filled 2018-10-24 (×4): qty 0.4

## 2018-10-24 NOTE — Progress Notes (Signed)
PROGRESS NOTE    Kaitlyn Harper  [id number] DOB: 1960-11-30 DOA: 10/20/2018 PCP: Debbrah Alar, NP   Brief Narrative:  HPI per Dr. Mitzi Hansen on 10/20/2018 Kaitlyn Harper is a 58 y.o. female with medical history significant for hypertension, diet-controlled diabetes mellitus, hyperlipidemia, depression, and anxiety, now presenting to the emergency department for evaluation of abdominal pain.  Patient developed lower abdominal discomfort, fevers, chills, and nausea without vomiting approximately 1 week ago, was suspected to have UTI, was given a dose of Rocephin and started on Levaquin on 10/16/2018, continued to feel poorly with ongoing lower abdominal pain, was noted to have leukocytosis greater than 20,000 on outpatient blood work, and was directed to the ED for further evaluation of this.  She has had some loose stools without any melena or hematochezia, has nausea and loss of appetite, but no vomiting.  She had never experienced these symptoms previously.  Denies cough or shortness of breath.  [facility] ED Course: Upon arrival to the ED, patient is found to be afebrile, saturating well on room air, slightly tachycardic, and with stable blood pressure.  Chemistry panel is notable for potassium of 3.0 and mild elevation in transaminases.  CBC features a leukocytosis to 20,800.  CT of the abdomen and pelvis is concerning for acute sigmoid diverticulitis with perforation and multilocular pericolonic abscess.  COVID-19 is negative. Patient was given a liter of lactated Ringer's, Zofran, Dilaudid, and Zosyn in the emergency department.  Surgery was consulted by the ED physician and recommended medical admission.  **Interim History  Surgery evaluated and recommended IR to evaluate for possible drain placement however there is no safe window for drain to be placed and they recommended a follow-up CT which is to be done today. Continue with IV antibiotics and bowel rest but  Surgery advanced diet to CLD and recommending to continue ambulating.  General surgery still hopes the patient can have abscess drained after repeat CT scan. Patient is having flatus but currently having a bowel movement.  Continues to have some pain but is improved. CT Scan done this AM and is pending read.   Assessment & Plan:   Principal Problem:   Diverticulitis of large intestine with perforation and abscess Active Problems:   Essential hypertension   Hyperlipidemia   Tobacco abuse   Hypokalemia   Impaired glucose tolerance   Depressive disorder   Anemia   Anxiety   AV block, 1st degree   Acute Diverticulitis with perforation and abscess -Presents with persistent lower abdominal pain, fevers, and nausea despite treatment for suspected UTI, and is found to have acute sigmoid diverticulitis with perforation and abscess -She has been hemodynamically stable -Surgery is consulting and appreciate further evaluation and recommendations -IR has been consulted and today said that they are unable to safely drain the abscess due to it's positioning -Temps improving 101 and TMax wa 99.5, WBC slightly down 22.6 -> 17.2 -> 15.9 -> 14.5 -Diet was advanced to clear liquid diet and IV fluids have been now changed to sodium bicarbonate 150 mEq in D5 rate of 75 mL's per hour -IV Zosyn for Abx Coverage and with Pain-control with IV Hydromorphone 0.5-1 mg IV q3hprn Moderate and Severe Pain and also continue with IV methocarbamol 1000 mg every 6 hours PRN for muscle spasm -IVF hydration with NS + 40 mEQ KCl at 75 mL/hr now stopped and changed to sodium bicarbonate 150 mcg and D5W at 75 mLper hour -Antiemeticswith IV Zofran  -Further care per  general surgery and appreciate further recommendations; patient to get lactated Ringer boluses 1 L every 8 hours PRN for systolic blood pressure less than 90 or urine output less than 250 mL's every 8 hours for 2 days -General Surgery repeated CT scan of the  Abdomen/Pelvis today Friday, 10/24/2018 and it is done and pending read -Diet advanced per general surgery and she is now on a clear liquid diet -We will need to review CT scan and have surgery to further weigh in based on if she will require a drain or not  Hypertension -BP at goal in 120/80 range on admission but now currently 147/77 -Continuef holding home ARB/ CCB and coregas she was was not taking anything by p.o. but will resume now that she is on a clear liquid diet -Use labetalol IVP's as needed while NPO; currently on IV labetalol 10 mg every 2 as needed for systolic blood pressure around [id number] or diastolic pressure than [id number]  Type II DM -Diet-controlled at home, serum glucose is 108 in EDand repeat this morning was 136 - BS's are stable and CBGs have been ranging from 84-110 and Daily blood sugars on BMP/CMP  have been ranging from 90-136 -Monitoring CBG's qam, holding off on SSI for now and will continue to monitor carefully  Abnormal LFTs, improving  -Unclear Etiology but suspect in the setting of Diverticulitis and trending down -Patient's AST went from 67 -> 47 -> 45 -> 35 -Patient's ALT went from 92 -> 74 -> 67 -> 47 -Continue to Monitor and Trend and if worsening or not improving will obtain a RUQ U/S and a Acute Hepatitis Panel -Continue to Hold Statin  -Repeat CMP in AM   Hyperbilirubinemia, improving -Likely reactive -Patient's T bili 0.9 is now 1.8 -> 1.5 -> 1.0 -Continue to monitor and Trend -Repeat CMP in a.m.  Overweight -Estimated body mass index is 27.06 kg/m as calculated from the following:   Height as of this encounter: 5\' 4"  (1.626 m).   Weight as of this encounter: 71.5 kg. -Weight Loss and Dietary Counseling given   Metabolic Acidosis, worsened  -Pateint's CO2 was 21, AG was 10 and Chlroide was 108; Now CO2 is 25, AG is 11, and Chloride is 100 -Changed IVF to Sodium Bicarbonate 150 mEQ in D5W at 75 mL/hr yesterday and will D/C IVF today given  improvement  -Continue to Monitor and Trend -Repeat CMP in AM   Normocytic Anemia, slightly decreasing  -Patient's Hb/Hct went from 12.8/38.9 -> 11.4/34.1 -> 11.2/34.6 -> 10.9/33.0 -> 11.2/33.4 -Checked Anemia Panel and showed an iron level of 31, U IBC 177, TIBC of 208, saturation ratios of 15%, ferritin level 279, folate level 15.3, and vitamin B12 level of 2845 -Continue to Monitor for S/Sx of Bleeding; Currently no overt Bleeding noted -Repeat CBC in AM   Hypokalemia -Patient potassium this morning was 3.1 -Replete with p.o. potassium chloride 40 mEq twice daily x2 doses -Continue to monitor and replete as necessary -Repeat CMP in AM  DVT prophylaxis: SCDs Code Status: FULL CODE Family Communication: No family present at bedside  Disposition Plan: Pending improvement and clearance by General Surgery  Consultants:   General Surgery  Interventional Radiology    Procedures: None  Antimicrobials:  Anti-infectives (From admission, onward)   Start     Dose/Rate Route Frequency Ordered Stop   10/20/18 2115  piperacillin-tazobactam (ZOSYN) IVPB 3.375 g     3.375 g 12.5 mL/hr over 240 Minutes Intravenous Every 8 hours 10/20/18 2107  10/20/18 2100  piperacillin-tazobactam (ZOSYN) IVPB 3.375 g  Status:  Discontinued     3.375 g 100 mL/hr over 30 Minutes Intravenous  Once 10/20/18 2052 10/21/18 0105     Subjective: Seen and examined at bedside was doing well and had a small bowel movement and was passing flatus.  Denies chest pain, lightheadedness or dizziness.  She is ambulating the halls without issues.  Says that her abdomen is nontender.  No other concerns requested this time.  Objective: Vitals:   10/23/18 1300 10/23/18 1407 10/23/18 2055 10/24/18 0510  BP: 132/68 134/67 138/73 127/77  Pulse:  (!) 59 67 62  Resp:  16 18 18   Temp:  98.7 F (37.1 C) 98.9 F (37.2 C) 98.9 F (37.2 C)  TempSrc:   Oral Oral  SpO2:  97% 97% 98%  Weight:    71.5 kg  Height:         Intake/Output Summary (Last 24 hours) at 10/24/2018 0835 Last data filed at 10/24/2018 0537 Gross per 24 hour  Intake 2982.46 ml  Output 2350 ml  Net 632.46 ml   Filed Weights   10/20/18 1621 10/23/18 0552 10/24/18 0510  Weight: 70 kg 70.6 kg 71.5 kg   Examination: Physical Exam:  Constitutional: WN/WD overweight African-American female currently in NAD and appears calm and comfortable Eyes: Lids and conjunctivae normal, sclerae anicteric  ENMT: External Ears, Nose appear normal. Grossly normal hearing. Mucous membranes are moist.  Neck: Appears normal, supple, no cervical masses, normal ROM, no appreciable thyromegaly; no JVD Respiratory: Clear to auscultation bilaterally, no wheezing, rales, rhonchi or crackles. Normal respiratory effort and patient is not tachypenic. No accessory muscle use.  Cardiovascular: RRR, no murmurs / rubs / gallops. S1 and S2 auscultated. No extremity edema.  Abdomen: Soft, very slightly tender, mildly distended. No masses palpated. No appreciable hepatosplenomegaly. Bowel sounds positive x4.  GU: Deferred. Musculoskeletal: No clubbing / cyanosis of digits/nails. No joint deformity upper and lower extremities.  Skin: No rashes, lesions, ulcers on a limited skin evaluation. No induration; Warm and dry.  Neurologic: CN 2-12 grossly intact with no focal deficits. Romberg sign and cerebellar reflexes not assessed.  Psychiatric: Normal judgment and insight. Alert and oriented x 3. Normal mood and appropriate affect.   Data Reviewed: I have personally reviewed following labs and imaging studies  CBC: Recent Labs  Lab 10/20/18 1942 10/21/18 0300 10/22/18 0819 10/23/18 0335 10/24/18 0815  WBC 20.8* 19.9* 17.2* 15.9* 14.5*  NEUTROABS 15.3* 14.0*  --  12.1*  --   HGB 12.8 11.4* 11.2* 10.9* 11.2*  HCT 38.9 34.1* 34.6* 33.0* 33.4*  MCV 93.1 94.2 96.9 95.7 94.1  PLT 366 350 382 336 [id number]   Basic Metabolic Panel: Recent Labs  Lab 10/20/18 1942  10/21/18 0300 10/22/18 0844 10/23/18 0335  NA 138 139 140 139  K 3.0* 3.2* 4.2 4.1  CL 104 108 111 111  CO2 22 21* 21* 17*  GLUCOSE 108* 109* 90 125*  BUN 13 11 9 8   CREATININE 0.94 0.81 0.89 0.84  CALCIUM 9.1 8.5* 8.9 8.3*  MG  --  1.9 2.1 2.0  PHOS  --   --  3.1 2.9   GFR: Estimated Creatinine Clearance: 69 mL/min (by C-G formula based on SCr of 0.84 mg/dL). Liver Function Tests: Recent Labs  Lab 10/20/18 1942 10/21/18 0300 10/22/18 0844 10/23/18 0335  AST 67* 47* 45* 32  ALT 92* 74* 67* 53*  ALKPHOS 164* 137* 164* 142*  BILITOT 0.9 1.2 1.8*  1.5*  PROT 7.9 6.7 7.1 7.3  ALBUMIN 3.5 3.1* 3.1* 3.0*   No results for input(s): LIPASE, AMYLASE in the last 168 hours. No results for input(s): AMMONIA in the last 168 hours. Coagulation Profile: Recent Labs  Lab 10/20/18 2138  INR 1.1   Cardiac Enzymes: No results for input(s): CKTOTAL, CKMB, CKMBINDEX, TROPONINI in the last 168 hours. BNP (last 3 results) No results for input(s): PROBNP in the last 8760 hours. HbA1C: No results for input(s): HGBA1C in the last 72 hours. CBG: Recent Labs  Lab 10/21/18 0721 10/22/18 0733 10/23/18 0809 10/24/18 0745  GLUCAP 110* 84 105* 137*   Lipid Profile: No results for input(s): CHOL, HDL, LDLCALC, TRIG, CHOLHDL, LDLDIRECT in the last 72 hours. Thyroid Function Tests: No results for input(s): TSH, T4TOTAL, FREET4, T3FREE, THYROIDAB in the last 72 hours. Anemia Panel: Recent Labs    10/23/18 0335  VITAMINB12 2,845*  FOLATE 15.3  FERRITIN 279  TIBC 208*  IRON 31  RETICCTPCT 1.5   Sepsis Labs: No results for input(s): PROCALCITON, LATICACIDVEN in the last 168 hours.  Recent Results (from the past 240 hour(s))  Urine culture     Status: Abnormal   Collection Time: 10/16/18  5:58 PM   Specimen: Urine, Clean Catch  Result Value Ref Range Status   Specimen Description URINE, CLEAN CATCH  Final   Special Requests NONE  Final   Culture (A)  Final    <10,000 COLONIES/mL  INSIGNIFICANT GROWTH Performed at [facility], [address], [place], [place] [address]    Report Status 10/17/2018 FINAL  Final  SARS Coronavirus [facility] order, Performed in [facility] hospital lab) Nasopharyngeal Nasopharyngeal Swab     Status: None   Collection Time: 10/20/18  9:51 PM   Specimen: Nasopharyngeal Swab  Result Value Ref Range Status   SARS Coronavirus 2 NEGATIVE NEGATIVE Final    Comment: (NOTE) If result is NEGATIVE SARS-CoV-2 target nucleic acids are NOT DETECTED. The SARS-CoV-2 RNA is generally detectable in upper and lower  respiratory specimens during the acute phase of infection. The lowest  concentration of SARS-CoV-2 viral copies this assay can detect is 250  copies / mL. A negative result does not preclude SARS-CoV-2 infection  and should not be used as the sole basis for treatment or other  patient management decisions.  A negative result may occur with  improper specimen collection / handling, submission of specimen other  than nasopharyngeal swab, presence of viral mutation(s) within the  areas targeted by this assay, and inadequate number of viral copies  (<250 copies / mL). A negative result must be combined with clinical  observations, patient history, and epidemiological information. If result is POSITIVE SARS-CoV-2 target nucleic acids are DETECTED. The SARS-CoV-2 RNA is generally detectable in upper and lower  respiratory specimens dur ing the acute phase of infection.  Positive  results are indicative of active infection with SARS-CoV-2.  Clinical  correlation with patient history and other diagnostic information is  necessary to determine patient infection status.  Positive results do  not rule out bacterial infection or co-infection with other viruses. If result is PRESUMPTIVE POSTIVE SARS-CoV-2 nucleic acids MAY BE PRESENT.   A presumptive positive result was obtained on the submitted specimen  and confirmed on repeat testing.   While 2019 novel coronavirus  (SARS-CoV-2) nucleic acids may be present in the submitted sample  additional confirmatory testing may be necessary for epidemiological  and / or clinical management purposes  to  differentiate between  SARS-CoV-2 and other Sarbecovirus currently known to infect humans.  If clinically indicated additional testing with an alternate test  methodology [phone]) is advised. The SARS-CoV-2 RNA is generally  detectable in upper and lower respiratory sp ecimens during the acute  phase of infection. The expected result is Negative. Fact Sheet for Patients:  StrictlyIdeas.no Fact Sheet for Healthcare Providers: BankingDealers.co.za This test is not yet approved or cleared by the [place] FDA and has been authorized for detection and/or diagnosis of SARS-CoV-2 by FDA under an Emergency Use Authorization (EUA).  This EUA will remain in effect (meaning this test can be used) for the duration of the COVID-19 declaration under Section 564(b)(1) of the Act, 21 U.S.C. section 360bbb-3(b)(1), unless the authorization is terminated or revoked sooner. Performed at [facility], [address] [place], [place] [address]     Radiology Studies: No results found. Scheduled Meds:  amLODipine  10 mg Oral Daily   And   irbesartan  300 mg Oral Daily   carvedilol  6.25 mg Oral BID WC   enoxaparin (LOVENOX) injection  40 mg Subcutaneous Q24H   Continuous Infusions:  sodium chloride 250 mL (10/20/18 2104)   methocarbamol (ROBAXIN) IV     ondansetron (ZOFRAN) IV     piperacillin-tazobactam (ZOSYN)  IV 3.375 g (10/24/18 0537)   sodium bicarbonate 150 mEq in dextrose 5% 1000 mL 150 mEq (10/23/18 2057)    LOS: 4 days   Kerney Elbe, DO [facility] PAGER is on [place]  If 7PM-7AM, please contact night-coverage [email or web address] Password [facility] 10/24/2018, 8:35 AM

## 2018-10-24 NOTE — Consult Note (Signed)
Chief Complaint: Patient was seen in consultation today for CT-guided aspiration/drainage of pelvic/diverticular abscess Chief Complaint  Patient presents with   Urinary Tract Infection    Referring Physician(s): Martin,M  Supervising Physician: Daryll Brod  Patient Status: [facility] - In-pt  History of Present Illness: Kaitlyn Harper is a 58 y.o. female with past medical history significant for hypertension, diabetes, hyperlipidemia, anxiety/depression who was recently admitted to [facility] with abdominal pain, loose stools and nausea.  She had been treated for suspected UTI however symptoms persisted.  Recent imaging has revealed acute sigmoid diverticulitis with perforation and multilocular pericolonic abscess.  Case was initially reviewed for possible image guided drainage on 9/8 however there was no safe percutaneous window for drain placement.  Follow-up CT scan today reveals persistent sigmoid diverticulitis with associated abscess that is both better demarcated and increased in size since previous scan.  Request received again for CT-guided drainage of the abscess.  Past Medical History:  Diagnosis Date   Anemia    Anxiety    Depression    Diabetes mellitus without complication ([place])    type 2-diet controlled   Hyperglycemia    Hyperlipidemia    Hypertension    Syncope 03/18/2016    Past Surgical History:  Procedure Laterality Date   [place]   COLONOSCOPY W/ POLYPECTOMY  03/01/2015   Dr Oretha Caprice, [facility].  +adenomatous polyps   COLONOSCOPY W/ POLYPECTOMY  02/28/2011   4 polyps - 3 TA   DILATION AND CURETTAGE OF UTERUS  2005   HEMORRHOID SURGERY  2002   KNEE SURGERY  1983   arthroscopy?   LEFT HEART CATH AND CORONARY ANGIOGRAPHY N/A 03/19/2016   Procedure: Left Heart Cath and Coronary Angiography;  Surgeon: Peter M Martinique, MD;  Location: [facility];  Service: Cardiovascular;  Laterality:  N/A;   MOUTH SURGERY  03/08/2016   POLYPECTOMY  2006   ?anal polyp?   TEAR DUCT PROBING  2004   TUBAL LIGATION  1985    Allergies: Sulfonamide derivatives  Medications: Prior to Admission medications   Medication Sig Start Date End Date Taking? Authorizing Provider  albuterol (PROVENTIL HFA;VENTOLIN HFA) 108 (90 Base) MCG/ACT inhaler Inhale 2 puffs into the lungs every 6 (six) hours as needed for wheezing or shortness of breath. 03/14/17  Yes Saguier, Percell Miller, PA-C  amLODipine-valsartan (EXFORGE) 10-320 MG tablet TAKE 1 TABLET BY MOUTH ONCE DAILY Patient taking differently: Take 1 tablet by mouth daily.  09/18/18  Yes Debbrah Alar, NP  ARIPiprazole (ABILIFY) 10 MG tablet Take 10 mg by mouth daily after breakfast.  03/01/16  Yes [provider]  aspirin EC 81 MG tablet Take 81 mg by mouth daily.   Yes [provider]  atorvastatin (LIPITOR) 20 MG tablet TAKE 1 TABLET BY MOUTH ONCE DAILY Patient taking differently: Take 20 mg by mouth daily after breakfast.  08/06/18  Yes Debbrah Alar, NP  carvedilol (COREG) 6.25 MG tablet TAKE 1 TABLET BY MOUTH TWICE DAILY WITH A MEAL Patient taking differently: Take 6.25 mg by mouth 2 (two) times daily with a meal.  09/18/18  Yes Debbrah Alar, NP  lamoTRIgine (LAMICTAL) 100 MG tablet Take 100 mg by mouth 2 (two) times daily.  07/21/15  Yes [provider]  ondansetron (ZOFRAN ODT) 4 MG disintegrating tablet Take 1 tablet (4 mg total) by mouth every 8 (eight) hours as needed for nausea or vomiting. 10/16/18  Yes Lamptey, Myrene Galas, MD  traZODone (DESYREL) 50 MG tablet Take 100 mg by mouth at bedtime.  02/21/16  Yes [provider]  triamcinolone ointment (KENALOG) 0.1 % Apply 1 application topically 2 (two) times daily as needed (rash).    Yes [provider]  amLODipine-valsartan (EXFORGE) 10-320 MG tablet TAKE 1 TABLET DAILY BY MOUTH. 03/18/18   Debbrah Alar, NP  carvedilol (COREG) 6.25 MG  tablet TAKE 1 TABLET BY MOUTH TWICE DAILY WITH A MEAL 03/10/18   Debbrah Alar, NP     Family History  Problem Relation Age of Onset   Hypertension Father    Sarcoidosis Brother    Alcohol abuse Brother    Cirrhosis Brother    Stroke Maternal Grandmother    Colon cancer Neg Hx    Colon polyps Neg Hx    Esophageal cancer Neg Hx    Rectal cancer Neg Hx    Stomach cancer Neg Hx    Pulmonary embolism Neg Hx     Social History   Socioeconomic History   Marital status: Divorced    Spouse name: Not on file   Number of children: 3   Years of education: Not on file   Highest education level: Not on file  Occupational History   Occupation: Teacher, music    Employer: [employer]  Social Needs   Emergency planning/management officer strain: Not on file   Food insecurity    Worry: Not on file    Inability: Not on file   Transportation needs    Medical: Not on file    Non-medical: Not on file  Tobacco Use   Smoking status: Former Smoker    Packs/day: 0.25    Types: Cigarettes    Quit date: 07/02/2015    Years since quitting: 3.3   Smokeless tobacco: Never Used   Tobacco comment: 10 cigarettes daily  Substance and Sexual Activity   Alcohol use: Yes    Alcohol/week: 0.0 standard drinks    Comment: socially   Drug use: No   Sexual activity: Yes    Birth control/protection: Post-menopausal  Lifestyle   Physical activity    Days per week: Not on file    Minutes per session: Not on file   Stress: Not on file  Relationships   Social connections    Talks on phone: Not on file    Gets together: Not on file    Attends religious service: Not on file    Active member of club or organization: Not on file    Attends meetings of clubs or organizations: Not on file    Relationship status: Not on file  Other Topics Concern   Not on file  Social History Narrative   Caffeine use:  2 drinks   Regular exercise: no   Lives with her daughter   3 children    2  grandchildren   1 great grandson     Review of Systems currently denies fever, headache, chest pain, dyspnea, cough, back pain, nausea, vomiting or bleeding.  She does have some central lower abdominal/pelvic discomfort  Vital Signs: BP 138/73 (BP Location: Left Arm)    Pulse 64    Temp 99.5 F (37.5 C) (Oral)    Resp 18    Ht 5\' 4"  (1.626 m)    Wt 157 lb 10.1 oz (71.5 kg)    LMP 12/25/2008    SpO2 98%    BMI 27.06 kg/m   Physical Exam awake, alert.  Chest clear to auscultation bilaterally.  Heart with regular rate and rhythm.  Abdomen soft, positive bowel sounds, tender central lower abdominal/pelvic region to palpation; no significant lower extremity edema  Imaging: Dg Abd 1 View  Result Date: 10/16/2018 CLINICAL DATA:  Abdominal pain EXAM: ABDOMEN - 1 VIEW COMPARISON:  None. FINDINGS: The bowel gas pattern is normal. No radio-opaque calculi or other significant radiographic abnormality are seen. IMPRESSION: Negative. Electronically Signed   By: Constance Holster M.D.   On: 10/16/2018 17:34   Ct Abdomen Pelvis W Contrast  Result Date: 10/24/2018 CLINICAL DATA:  Left lower quadrant pain, follow-up diverticular abscess EXAM: CT ABDOMEN AND PELVIS WITH CONTRAST TECHNIQUE: Multidetector CT imaging of the abdomen and pelvis was performed using the standard protocol following bolus administration of intravenous contrast. CONTRAST:  143mL OMNIPAQUE IOHEXOL 300 MG/ML  SOLN COMPARISON:  10/20/2018 FINDINGS: Lower chest: Mild left basilar atelectasis is noted. Hepatobiliary: No focal liver abnormality is seen. Status post cholecystectomy. No biliary dilatation. Pancreas: Unremarkable. No pancreatic ductal dilatation or surrounding inflammatory changes. Spleen: Normal in size without focal abnormality. Adrenals/Urinary Tract: Adrenal glands are within normal limits. Cystic changes are noted within the kidneys bilaterally. No obstructive changes are seen. The bladder is partially decompressed.  Stomach/Bowel: Colon again demonstrates changes of inflammation in the region of the sigmoid with changes of diverticulitis. The previously seen abscess is again identified and slightly more organized now measuring 7.7 x 5.0 cm. A smaller adjacent collection is seen in the left hemipelvis which measures 5 cm in greatest dimension. It appears to be contiguous with the larger right-sided collection. Large duodenal diverticulum is noted adjacent to the head of the pancreas. The stomach and small bowel are otherwise within normal limits. Vascular/Lymphatic: Aortic atherosclerosis. No enlarged abdominal or pelvic lymph nodes. Reproductive: Uterus and bilateral adnexa are unremarkable. Other: No abdominal wall hernia or abnormality. No abdominopelvic ascites. Musculoskeletal: No acute or significant osseous findings. IMPRESSION: Persistent sigmoid diverticulitis with associated abscess. The abscess cavity is better demarcated when compared with the prior exam and has increased in size with a somewhat bilobed appearance. The remainder of the exam is stable from the prior study. Electronically Signed   By: Inez Catalina M.D.   On: 10/24/2018 11:11   Ct Abdomen Pelvis W Contrast  Result Date: 10/20/2018 CLINICAL DATA:  Abdominal pain EXAM: CT ABDOMEN AND PELVIS WITH CONTRAST TECHNIQUE: Multidetector CT imaging of the abdomen and pelvis was performed using the standard protocol following bolus administration of intravenous contrast. CONTRAST:  183mL OMNIPAQUE IOHEXOL 300 MG/ML  SOLN COMPARISON:  None. FINDINGS: Lower chest: The visualized heart size within normal limits. No pericardial fluid/thickening. No hiatal hernia. The visualized portions of the lungs are clear. Hepatobiliary: The liver is normal in density without focal abnormality.The main portal vein is patent. The patient is status post cholecystectomy. No biliary ductal dilation. Pancreas: Unremarkable. No pancreatic ductal dilatation or surrounding inflammatory  changes. Spleen: Normal in size without focal abnormality. Adrenals/Urinary Tract: Both adrenal glands appear normal. There are bilateral low-density lesions seen within both kidneys. The largest in the upper pole of the left kidney measuring 1.5 cm, likely simple renal cysts. Stomach/Bowel: The stomach is normal in appearance. There is a duodenal diverticulum with retained food stuff seen within the third portion of the duodenum. The remainder of the small bowel is unremarkable.Surgical clips seen in the right lower quadrant. There is a 9 cm segment of sigmoid colonic diverticulitis with diffuse bowel wall thickening and surrounding extensive mesenteric fat stranding changes. Small amount of pneumoperitoneum is  seen adjacent to the sigmoid colon. There is a multilocular collection extending around the sigmoid colon best seen on series 2, image 64, measuring approximately 7.0 x 2.7 x 3.6 cm. The collection has fluid and small foci of air and is peripherally enhancing. Vascular/Lymphatic: There are no enlarged mesenteric, retroperitoneal, or pelvic lymph nodes. No significant vascular findings are present. Reproductive: The uterus and adnexa are unremarkable. there is free fluid seen within the cul-de-sac.1 Other: No evidence of abdominal wall mass or hernia. Musculoskeletal: No acute or significant osseous findings. IMPRESSION: Acute sigmoid colonic diverticulitis with perforation and multilocular pericolonic abscess measuring 7.0 x 2.7 x3.6 cm. These results were called by telephone at the time of interpretation on 10/20/2018 at 8:22 pm to Martinique Robinson PA, who verbally acknowledged these results. Electronically Signed   By: [name] M.D.   On: 10/20/2018 20:26    Labs:  CBC: Recent Labs    10/21/18 0300 10/22/18 0819 10/23/18 0335 10/24/18 0815  WBC 19.9* 17.2* 15.9* 14.5*  HGB 11.4* 11.2* 10.9* 11.2*  HCT 34.1* 34.6* 33.0* 33.4*  PLT 350 382 336 360    COAGS: Recent Labs    10/20/18 2138    INR 1.1    BMP: Recent Labs    10/21/18 0300 10/22/18 0844 10/23/18 0335 10/24/18 0815  NA 139 140 139 136  K 3.2* 4.2 4.1 3.1*  CL 108 111 111 100  CO2 21* 21* 17* 25  GLUCOSE 109* 90 125* 136*  BUN 11 9 8  <5*  CALCIUM 8.5* 8.9 8.3* 8.3*  CREATININE 0.81 0.89 0.84 0.83  GFRNONAA >60 >60 >60 >60  GFRAA >60 >60 >60 >60    LIVER FUNCTION TESTS: Recent Labs    10/21/18 0300 10/22/18 0844 10/23/18 0335 10/24/18 0815  BILITOT 1.2 1.8* 1.5* 1.0  AST 47* 45* 32 35  ALT 74* 67* 53* 47*  ALKPHOS 137* 164* 142* 127*  PROT 6.7 7.1 7.3 7.3  ALBUMIN 3.1* 3.1* 3.0* 3.1*    TUMOR MARKERS: No results for input(s): AFPTM, CEA, CA199, CHROMGRNA in the last 8760 hours.  Assessment and Plan: 58 y.o. female with past medical history significant for hypertension, diabetes, hyperlipidemia, anxiety/depression who was recently admitted to [facility] with abdominal pain, loose stools and nausea.  She had been treated for suspected UTI however symptoms persisted.  Recent imaging has revealed acute sigmoid diverticulitis with perforation and multilocular pericolonic abscess.  Case was initially reviewed for possible image guided drainage on 9/8 however there was no safe percutaneous window for drain placement.  Follow-up CT scan today reveals persistent sigmoid diverticulitis with associated abscess that is both better demarcated and increased in size since previous scan.  Request received again for CT-guided drainage of the abscess.  Latest CT scan has been reviewed by Dr. Annamaria Boots. There continues to be a very narrow window for percutaneous access of the pelvic abscess, however we will attempt drain placement on 9/12. Risks and benefits discussed with the patient including bleeding, infection, damage to adjacent structures, bowel perforation/fistula connection, inability to place drain and sepsis.  All of the patient's questions were answered, patient is agreeable to proceed. Consent  signed and in chart.      Thank you for this interesting consult.  I greatly enjoyed meeting Kaitlyn Harper and look forward to participating in their care.  A copy of this report was sent to the requesting provider on this date.  Electronically Signed: D. Rowe Robert, PA-C 10/24/2018, 4:38 PM   I  spent a total of 30 minutes in face to face in clinical consultation, greater than 50% of which was counseling/coordinating care for CT-guided aspiration/drainage of pelvic/diverticular abscess

## 2018-10-24 NOTE — Progress Notes (Signed)
[facility]/Trauma Progress Note      Assessment/Plan HTN Type 2 diabetes Depression Anxiety  Acute sigmoid diverticulitis with perforation and pericolonic abscess 7X2.7X 3.6cm -IR unable to drain this collection,no safe window. They recommend follow-up CT in a few days -Hopefully they will be able to drain this abscess in a few days and we can avoid surgery in this patient. We will follow as patient will likely need surgery if she is unable to have this abscess drained -Continue IV antibiotics and bowel rest -Ambulate - CT scan today pending  FEN:CLD,IVF VTE: SCD's, Lovenox [id number] Foley:None Follow up:TBD   LOS: 4 days    Subjective: CC: Abdominal pain  Pain unchanged from yesterday.  No nausea vomiting overnight.  Continued chills but no fevers.  She is drinking contrast currently.  Objective: Vital signs in last 24 hours: Temp:  [98.7 F (37.1 C)-98.9 F (37.2 C)] 98.9 F (37.2 C) (09/11 0510) Pulse Rate:  [59-67] 62 (09/11 0510) Resp:  [16-18] 18 (09/11 0510) BP: (127-138)/(67-77) 127/77 (09/11 0510) SpO2:  [97 %-98 %] 98 % (09/11 0510) Weight:  [71.5 kg] 71.5 kg (09/11 0510) Last BM Date: 10/19/18  Intake/Output from previous day: 09/10 0701 - 09/11 0700 In: 2982.5 [P.O.:1200; I.V.:1485.6; IV Piggyback:296.8] Out: 2350 [Urine:2350] Intake/Output this shift: No intake/output data recorded.  PE: Gen: Alert, NAD, pleasant, cooperative, well-appearing Pulm:Rate andeffort normal Abd: Soft, ND, +BS,TTP of suprapubic region and LLQ without guarding, no peritonitis Skin: no rashes noted, warm and dry  Anti-infectives: Anti-infectives (From admission, onward)   Start     Dose/Rate Route Frequency Ordered Stop   10/20/18 2115  piperacillin-tazobactam (ZOSYN) IVPB 3.375 g     3.375 g 12.5 mL/hr over 240 Minutes Intravenous Every 8 hours 10/20/18 2107     10/20/18 2100  piperacillin-tazobactam (ZOSYN) IVPB 3.375 g   Status:  Discontinued     3.375 g 100 mL/hr over 30 Minutes Intravenous  Once 10/20/18 2052 10/21/18 0105      Lab Results:  Recent Labs    10/23/18 0335 10/24/18 0815  WBC 15.9* 14.5*  HGB 10.9* 11.2*  HCT 33.0* 33.4*  PLT 336 360   BMET Recent Labs    10/22/18 0844 10/23/18 0335  NA 140 139  K 4.2 4.1  CL 111 111  CO2 21* 17*  GLUCOSE 90 125*  BUN 9 8  CREATININE 0.89 0.84  CALCIUM 8.9 8.3*   PT/INR No results for input(s): LABPROT, INR in the last 72 hours. CMP     Component Value Date/Time   NA 139 10/23/2018 0335   K 4.1 10/23/2018 0335   CL 111 10/23/2018 0335   CO2 17 (L) 10/23/2018 0335   GLUCOSE 125 (H) 10/23/2018 0335   BUN 8 10/23/2018 0335   CREATININE 0.84 10/23/2018 0335   CREATININE 1.03 04/19/2017 1617   CALCIUM 8.3 (L) 10/23/2018 0335   PROT 7.3 10/23/2018 0335   ALBUMIN 3.0 (L) 10/23/2018 0335   AST 32 10/23/2018 0335   ALT 53 (H) 10/23/2018 0335   ALKPHOS 142 (H) 10/23/2018 0335   BILITOT 1.5 (H) 10/23/2018 0335   GFRNONAA >60 10/23/2018 0335   GFRNONAA 61 08/25/2013 1129   GFRAA >60 10/23/2018 0335   GFRAA 71 08/25/2013 1129   Lipase  No results found for: LIPASE  Studies/Results: No results found.    Kalman Drape , [facility] 10/24/2018, 8:51 AM  Pager: [phone] Mon-Wed, Friday 7:00am-4:30pm Thurs 7am-11:30am  Consults: [phone]

## 2018-10-25 ENCOUNTER — Inpatient Hospital Stay (HOSPITAL_COMMUNITY): Payer: 59

## 2018-10-25 LAB — COMPREHENSIVE METABOLIC PANEL
ALT: 44 U/L (ref 0–44)
AST: 29 U/L (ref 15–41)
Albumin: 3.1 g/dL — ABNORMAL LOW (ref 3.5–5.0)
Alkaline Phosphatase: 113 U/L (ref 38–126)
Anion gap: 9 (ref 5–15)
BUN: <5 mg/dL — ABNORMAL LOW (ref 6–20)
CO2: 26 mmol/L (ref 22–32)
Calcium: 8.8 mg/dL — ABNORMAL LOW (ref 8.9–10.3)
Chloride: 104 mmol/L (ref 98–111)
Creatinine, Ser: 0.89 mg/dL (ref 0.44–1.00)
GFR calc Af Amer: >60 mL/min (ref >60)
GFR calc non Af Amer: >60 mL/min (ref >60)
Glucose, Bld: 106 mg/dL — ABNORMAL HIGH (ref 70–99)
Potassium: 3.8 mmol/L (ref 3.5–5.1)
Sodium: 139 mmol/L (ref 135–145)
Total Bilirubin: 0.9 mg/dL (ref 0.3–1.2)
Total Protein: 7.1 g/dL (ref 6.5–8.1)

## 2018-10-25 LAB — CBC WITH DIFFERENTIAL/PLATELET
Abs Immature Granulocytes: 0.14 10*3/uL — ABNORMAL HIGH (ref 0.00–0.07)
Basophils Absolute: 0.1 10*3/uL (ref 0.0–0.1)
Basophils Relative: 0 %
Eosinophils Absolute: 0.1 10*3/uL (ref 0.0–0.5)
Eosinophils Relative: 1 %
HCT: 33.8 % — ABNORMAL LOW (ref 36.0–46.0)
Hemoglobin: 11.2 g/dL — ABNORMAL LOW (ref 12.0–15.0)
Immature Granulocytes: 1 %
Lymphocytes Relative: 17 %
Lymphs Abs: 2 10*3/uL (ref 0.7–4.0)
MCH: 31.2 pg (ref 26.0–34.0)
MCHC: 33.1 g/dL (ref 30.0–36.0)
MCV: 94.2 fL (ref 80.0–100.0)
Monocytes Absolute: 1.2 10*3/uL — ABNORMAL HIGH (ref 0.1–1.0)
Monocytes Relative: 10 %
Neutro Abs: 8.5 10*3/uL — ABNORMAL HIGH (ref 1.7–7.7)
Neutrophils Relative %: 71 %
Platelets: 367 10*3/uL (ref 150–400)
RBC: 3.59 MIL/uL — ABNORMAL LOW (ref 3.87–5.11)
RDW: 13.3 % (ref 11.5–15.5)
WBC: 11.9 10*3/uL — ABNORMAL HIGH (ref 4.0–10.5)
nRBC: 0 % (ref 0.0–0.2)

## 2018-10-25 LAB — GLUCOSE, CAPILLARY: Glucose-Capillary: 109 mg/dL — ABNORMAL HIGH (ref 70–99)

## 2018-10-25 LAB — PHOSPHORUS: Phosphorus: 4 mg/dL (ref 2.5–4.6)

## 2018-10-25 LAB — MAGNESIUM: Magnesium: 2.1 mg/dL (ref 1.7–2.4)

## 2018-10-25 MED ORDER — FENTANYL CITRATE (PF) 100 MCG/2ML IJ SOLN
INTRAMUSCULAR | Status: AC
  Filled 2018-10-25: qty 4

## 2018-10-25 MED ORDER — MIDAZOLAM HCL 2 MG/2ML IJ SOLN
INTRAMUSCULAR | Status: AC
  Filled 2018-10-25: qty 6

## 2018-10-25 NOTE — Progress Notes (Signed)
[facility]/Trauma Progress Note      Assessment/Plan HTN Type 2 diabetes Depression Anxiety  Acute sigmoid diverticulitis with perforation and pericolonic abscess 7X2.7X 3.6cm -IR plans to drain today -Continue IV antibiotics and bowel rest -Ambulate   FEN:CLD,IVF VTE: SCD's, Lovenox [id number] Foley:None Follow up:TBD   LOS: 5 days    Subjective: CC: Abdominal pain  Pain unchanged from yesterday.  No nausea vomiting overnight.   Objective: Vital signs in last 24 hours: Temp:  [99 F (37.2 C)-99.6 F (37.6 C)] 99 F (37.2 C) (09/12 0644) Pulse Rate:  [58-65] 63 (09/12 0644) Resp:  [16-18] 18 (09/12 0644) BP: (126-147)/(73-82) 127/82 (09/12 0644) SpO2:  [97 %-98 %] 98 % (09/12 0644) Weight:  [70.4 kg] 70.4 kg (09/12 0500) Last BM Date: 10/19/18  Intake/Output from previous day: 09/11 0701 - 09/12 0700 In: 1821.6 [P.O.:730; I.V.:969.1; IV Piggyback:122.5] Out: 3000 [Urine:3000] Intake/Output this shift: No intake/output data recorded.  PE: Gen: Alert, NAD, pleasant, cooperative, well-appearing Pulm:Rate andeffort normal Abd: Soft,TTP of suprapubic region and LLQ without guarding, no peritonitis Skin: no rashes noted, warm and dry  Anti-infectives: Anti-infectives (From admission, onward)   Start     Dose/Rate Route Frequency Ordered Stop   10/20/18 2115  piperacillin-tazobactam (ZOSYN) IVPB 3.375 g     3.375 g 12.5 mL/hr over 240 Minutes Intravenous Every 8 hours 10/20/18 2107     10/20/18 2100  piperacillin-tazobactam (ZOSYN) IVPB 3.375 g  Status:  Discontinued     3.375 g 100 mL/hr over 30 Minutes Intravenous  Once 10/20/18 2052 10/21/18 0105      Lab Results:  Recent Labs    10/24/18 0815 10/25/18 0330  WBC 14.5* 11.9*  HGB 11.2* 11.2*  HCT 33.4* 33.8*  PLT 360 367   BMET Recent Labs    10/24/18 0815 10/25/18 0330  NA 136 139  K 3.1* 3.8  CL 100 104  CO2 25 26  GLUCOSE 136* 106*  BUN <5* <5*   CREATININE 0.83 0.89  CALCIUM 8.3* 8.8*   PT/INR No results for input(s): LABPROT, INR in the last 72 hours. CMP     Component Value Date/Time   NA 139 10/25/2018 0330   K 3.8 10/25/2018 0330   CL 104 10/25/2018 0330   CO2 26 10/25/2018 0330   GLUCOSE 106 (H) 10/25/2018 0330   BUN <5 (L) 10/25/2018 0330   CREATININE 0.89 10/25/2018 0330   CREATININE 1.03 04/19/2017 1617   CALCIUM 8.8 (L) 10/25/2018 0330   PROT 7.1 10/25/2018 0330   ALBUMIN 3.1 (L) 10/25/2018 0330   AST 29 10/25/2018 0330   ALT 44 10/25/2018 0330   ALKPHOS 113 10/25/2018 0330   BILITOT 0.9 10/25/2018 0330   GFRNONAA >60 10/25/2018 0330   GFRNONAA 61 08/25/2013 1129   GFRAA >60 10/25/2018 0330   GFRAA 71 08/25/2013 1129   Lipase  No results found for: LIPASE  Studies/Results: Ct Abdomen Pelvis W Contrast  Result Date: 10/24/2018 CLINICAL DATA:  Left lower quadrant pain, follow-up diverticular abscess EXAM: CT ABDOMEN AND PELVIS WITH CONTRAST TECHNIQUE: Multidetector CT imaging of the abdomen and pelvis was performed using the standard protocol following bolus administration of intravenous contrast. CONTRAST:  141mL OMNIPAQUE IOHEXOL 300 MG/ML  SOLN COMPARISON:  10/20/2018 FINDINGS: Lower chest: Mild left basilar atelectasis is noted. Hepatobiliary: No focal liver abnormality is seen. Status post cholecystectomy. No biliary dilatation. Pancreas: Unremarkable. No pancreatic ductal dilatation or surrounding inflammatory changes. Spleen: Normal in size without focal abnormality. Adrenals/Urinary Tract: Adrenal glands  are within normal limits. Cystic changes are noted within the kidneys bilaterally. No obstructive changes are seen. The bladder is partially decompressed. Stomach/Bowel: Colon again demonstrates changes of inflammation in the region of the sigmoid with changes of diverticulitis. The previously seen abscess is again identified and slightly more organized now measuring 7.7 x 5.0 cm. A smaller adjacent  collection is seen in the left hemipelvis which measures 5 cm in greatest dimension. It appears to be contiguous with the larger right-sided collection. Large duodenal diverticulum is noted adjacent to the head of the pancreas. The stomach and small bowel are otherwise within normal limits. Vascular/Lymphatic: Aortic atherosclerosis. No enlarged abdominal or pelvic lymph nodes. Reproductive: Uterus and bilateral adnexa are unremarkable. Other: No abdominal wall hernia or abnormality. No abdominopelvic ascites. Musculoskeletal: No acute or significant osseous findings. IMPRESSION: Persistent sigmoid diverticulitis with associated abscess. The abscess cavity is better demarcated when compared with the prior exam and has increased in size with a somewhat bilobed appearance. The remainder of the exam is stable from the prior study. Electronically Signed   By: Inez Catalina M.D.   On: [id number]      Rosario Adie, MD  [facility]

## 2018-10-25 NOTE — Plan of Care (Signed)
Patient lying in bed this morning; awaiting drainage by IR today. No needs expressed currently. Will continue to monitor.

## 2018-10-25 NOTE — Progress Notes (Signed)
PROGRESS NOTE    Kaitlyn Harper  [id number] DOB: 1960-05-06 DOA: 10/20/2018 PCP: Kaitlyn Alar, NP   Brief Narrative:  HPI per Dr. Mitzi Hansen on 10/20/2018 Kaitlyn Harper is a 58 y.o. female with medical history significant for hypertension, diet-controlled diabetes mellitus, hyperlipidemia, depression, and anxiety, now presenting to the emergency department for evaluation of abdominal pain.  Patient developed lower abdominal discomfort, fevers, chills, and nausea without vomiting approximately 1 week ago, was suspected to have UTI, was given a dose of Rocephin and started on Levaquin on 10/16/2018, continued to feel poorly with ongoing lower abdominal pain, was noted to have leukocytosis greater than 20,000 on outpatient blood work, and was directed to the ED for further evaluation of this.  She has had some loose stools without any melena or hematochezia, has nausea and loss of appetite, but no vomiting.  She had never experienced these symptoms previously.  Denies cough or shortness of breath.  [facility] ED Course: Upon arrival to the ED, patient is found to be afebrile, saturating well on room air, slightly tachycardic, and with stable blood pressure.  Chemistry panel is notable for potassium of 3.0 and mild elevation in transaminases.  CBC features a leukocytosis to 20,800.  CT of the abdomen and pelvis is concerning for acute sigmoid diverticulitis with perforation and multilocular pericolonic abscess.  COVID-19 is negative. Patient was given a liter of lactated Ringer's, Zofran, Dilaudid, and Zosyn in the emergency department.  Surgery was consulted by the ED physician and recommended medical admission.  **Interim History  Surgery evaluated and recommended IR to evaluate for possible drain placement however there is no safe window for drain to be placed and they recommended a follow-up CT which is was done. Will Continue with IV antibiotics and bowel rest but Surgery  advanced diet to CLD and recommending to continue ambulating.  General surgery still hopes the patient can have abscess drained after repeat CT scan but unfortunately there is no safe window to place drain per Dr. Vernard Gambles. Patient is having flatus but currently having a bowel movement.  Continues to have some pain but is improved.  Assessment & Plan:   Principal Problem:   Diverticulitis of large intestine with perforation and abscess Active Problems:   Essential hypertension   Hyperlipidemia   Tobacco abuse   Hypokalemia   Impaired glucose tolerance   Depressive disorder   Anemia   Anxiety   AV block, 1st degree   Acute Diverticulitis with perforation and abscess -Presents with persistent lower abdominal pain, fevers, and nausea despite treatment for suspected UTI, and is found to have acute sigmoid diverticulitis with perforation and abscess -She has been hemodynamically stable -Surgery is consulting and appreciate further evaluation and recommendations -IR has been consulted and today said that they are unable to safely drain the abscess due to it's positioning initially and even with Repeat CT Scan  -Temps improving 101 and TMax wa 99.5, WBC slightly down 22.6 -> 17.2 -> 15.9 -> 14.5 -> 11.9 -Diet was advanced to clear liquid diet and will continue -IV fluids have been now changed to sodium bicarbonate 150 mEq in D5 rate of 75 mL's per hour and now stopped  -C/w IV Zosyn for Abx Coverage and with Pain-control with IV Hydromorphone 0.5-1 mg IV q3hprn Moderate and Severe Pain and also continue with IV methocarbamol 1000 mg every 6 hours PRN for muscle spasm -IVF hydration now discontinued  -Antiemeticswith IV Zofran  -Further care per general surgery  and appreciate further recommendations;  -General Surgery repeated CT scan of the Abdomen/Pelvis today Friday, 10/24/2018 and showed "No significant change in size or appearance of pelvic interloop collections. Despite variations in  patient positioning, no safe percutaneous window for drain placement could be achieved. Aortoiliac  Atherosclerosis." -Diet advanced per general surgery and she is now on a clear liquid diet and will continue   Hypertension -BP at goal in 120/80 range on admission but now currently 166/85 -Continuef holding home ARB/ CCB and coregas she was was not taking anything by p.o. but will resume now that she is on a clear liquid diet -Resumed Amlodipine 10 mg po Daily and Irbesartan 300 mg po Daily; C/w Carvedilol 6.25 mg po BID -Use labetalol IVP's as needed while NPO; currently on IV labetalol 10 mg every 2 as needed for systolic blood pressure around [id number] or diastolic pressure than [id number]  Type II DM -Diet-controlled at home, serum glucose is 108 in EDand repeat this morning was 136 - BS's are stable and CBGs have been ranging from 84-110 and Daily blood sugars on BMP/CMP  have been ranging from 90-136  -Monitoring CBG's qam, holding off on SSI for now and will continue to monitor carefully  Abnormal LFTs, improved -Unclear Etiology but suspect in the setting of Diverticulitis and trending down -Patient's AST went from 67 -> 47 -> 45 -> 35 -> 29 -Patient's ALT went from 92 -> 74 -> 67 -> 47 -> 44 -Continue to Monitor and Trend and if worsening or not improving will obtain a RUQ U/S and a Acute Hepatitis Panel -Continue to Hold Statin  -Repeat CMP in AM   Hyperbilirubinemia, improving -Likely reactive -Patient's T bili 0.9 is now 1.8 -> 1.5 -> 1.0 -> 0.9 -Continue to monitor and Trend -Repeat CMP in a.m.  Overweight -Estimated body mass index is 26.64 kg/m as calculated from the following:   Height as of this encounter: 5\' 4"  (1.626 m).   Weight as of this encounter: 70.4 kg. -Weight Loss and Dietary Counseling given   Metabolic Acidosis, Improved -Pateint's CO2 was 21, AG was 10 and Chlroide was 108; Now CO2 is 26, AG is 9, and Chloride is 104 -Changed IVF to Sodium Bicarbonate  150 mEQ in D5W at 75 mL/hr yesterday and will D/C IVF today given improvement  -Continue to Monitor and Trend -Repeat CMP in AM   Normocytic Anemia, slightly decreasing  -Patient's Hb/Hct went from 12.8/38.9 -> 11.4/34.1 -> 11.2/34.6 -> 10.9/33.0 -> 11.2/33.4 -> 11.2/33.8 -Checked Anemia Panel and showed an iron level of 31, U IBC 177, TIBC of 208, saturation ratios of 15%, ferritin level 279, folate level 15.3, and vitamin B12 level of 2845 -Continue to Monitor for S/Sx of Bleeding; Currently no overt Bleeding noted -Repeat CBC in AM   Hypokalemia, improved -Patient potassium this morning was 3.8 -Continue to monitor and replete as necessary -Repeat CMP in AM  DVT prophylaxis: SCDs Code Status: FULL CODE Family Communication: No family present at bedside  Disposition Plan: Pending improvement and clearance by General Surgery  Consultants:   General Surgery  Interventional Radiology    Procedures: None  Antimicrobials:  Anti-infectives (From admission, onward)   Start     Dose/Rate Route Frequency Ordered Stop   10/20/18 2115  piperacillin-tazobactam (ZOSYN) IVPB 3.375 g     3.375 g 12.5 mL/hr over 240 Minutes Intravenous Every 8 hours 10/20/18 2107     10/20/18 2100  piperacillin-tazobactam (ZOSYN) IVPB 3.375 g  Status:  Discontinued  3.375 g 100 mL/hr over 30 Minutes Intravenous  Once 10/20/18 2052 10/21/18 0105     Subjective: Seen and examined at bedside she was a little frustrated had that she could not have a safe window for abdominal drain.  No chest pain, lightheadedness or dizziness.  Tolerating diet and passing flatus.  No other concerns reported at this time.  Objective: Vitals:   10/24/18 2159 10/25/18 0500 10/25/18 0644 10/25/18 0930  BP: 126/78  127/82 (!) 166/85  Pulse: (!) 58  63 80  Resp: 16  18 19   Temp: 99.6 F (37.6 C)  99 F (37.2 C)   TempSrc: Oral  Oral   SpO2: 97%  98% 100%  Weight:  70.4 kg    Height:        Intake/Output Summary  (Last 24 hours) at 10/25/2018 1232 Last data filed at 10/25/2018 1050 Gross per 24 hour  Intake 1108.84 ml  Output 2400 ml  Net -1291.16 ml   Filed Weights   10/23/18 0552 10/24/18 0510 10/25/18 0500  Weight: 70.6 kg 71.5 kg 70.4 kg   Examination: Physical Exam:  Constitutional: WN/WD overweight AAF currently in NAD and appears calm and comfortable Eyes: Lids and conjunctivae normal, sclerae anicteric  ENMT: External Ears, Nose appear normal. Grossly normal hearing. Mucous membranes are moist.  Neck: Appears normal, supple, no cervical masses, normal ROM, no appreciable thyromegaly; no JVD Respiratory: Clear to auscultation bilaterally, no wheezing, rales, rhonchi or crackles. Normal respiratory effort and patient is not tachypenic. No accessory muscle use.  Cardiovascular: RRR, no murmurs / rubs / gallops. S1 and S2 auscultated. No appreciable extremity edema. 2+ pedal pulses. No carotid bruits.  Abdomen: Soft, non-tender, Distended slightly. No masses palpated. No appreciable hepatosplenomegaly. Bowel sounds positive x4.  GU: Deferred. Musculoskeletal: No clubbing / cyanosis of digits/nails. No joint deformity upper and lower extremities. Skin: No rashes, lesions, ulcers on a limited skin evaluation. No induration; Warm and dry.  Neurologic: CN 2-12 grossly intact with no focal deficits. Romberg sign and cerebellar reflexes not assessed.  Psychiatric: Normal judgment and insight. Alert and oriented x 3. Normal mood and appropriate affect.   Data Reviewed: I have personally reviewed following labs and imaging studies  CBC: Recent Labs  Lab 10/20/18 1942 10/21/18 0300 10/22/18 0819 10/23/18 0335 10/24/18 0815 10/25/18 0330  WBC 20.8* 19.9* 17.2* 15.9* 14.5* 11.9*  NEUTROABS 15.3* 14.0*  --  12.1*  --  8.5*  HGB 12.8 11.4* 11.2* 10.9* 11.2* 11.2*  HCT 38.9 34.1* 34.6* 33.0* 33.4* 33.8*  MCV 93.1 94.2 96.9 95.7 94.1 94.2  PLT 366 350 382 336 360 [id number]   Basic Metabolic  Panel: Recent Labs  Lab 10/21/18 0300 10/22/18 0844 10/23/18 0335 10/24/18 0815 10/25/18 0330  NA 139 140 139 136 139  K 3.2* 4.2 4.1 3.1* 3.8  CL 108 111 111 100 104  CO2 21* 21* 17* 25 26  GLUCOSE 109* 90 125* 136* 106*  BUN 11 9 8  <5* <5*  CREATININE 0.81 0.89 0.84 0.83 0.89  CALCIUM 8.5* 8.9 8.3* 8.3* 8.8*  MG 1.9 2.1 2.0 2.0 2.1  PHOS  --  3.1 2.9 3.2 4.0   GFR: Estimated Creatinine Clearance: 64.7 mL/min (by C-G formula based on SCr of 0.89 mg/dL). Liver Function Tests: Recent Labs  Lab 10/21/18 0300 10/22/18 0844 10/23/18 0335 10/24/18 0815 10/25/18 0330  AST 47* 45* 32 35 29  ALT 74* 67* 53* 47* 44  ALKPHOS 137* 164* 142* 127* 113  BILITOT 1.2  1.8* 1.5* 1.0 0.9  PROT 6.7 7.1 7.3 7.3 7.1  ALBUMIN 3.1* 3.1* 3.0* 3.1* 3.1*   No results for input(s): LIPASE, AMYLASE in the last 168 hours. No results for input(s): AMMONIA in the last 168 hours. Coagulation Profile: Recent Labs  Lab 10/20/18 2138  INR 1.1   Cardiac Enzymes: No results for input(s): CKTOTAL, CKMB, CKMBINDEX, TROPONINI in the last 168 hours. BNP (last 3 results) No results for input(s): PROBNP in the last 8760 hours. HbA1C: No results for input(s): HGBA1C in the last 72 hours. CBG: Recent Labs  Lab 10/21/18 0721 10/22/18 0733 10/23/18 0809 10/24/18 0745 10/25/18 0755  GLUCAP 110* 84 105* 137* 109*   Lipid Profile: No results for input(s): CHOL, HDL, LDLCALC, TRIG, CHOLHDL, LDLDIRECT in the last 72 hours. Thyroid Function Tests: No results for input(s): TSH, T4TOTAL, FREET4, T3FREE, THYROIDAB in the last 72 hours. Anemia Panel: Recent Labs    10/23/18 0335  VITAMINB12 2,845*  FOLATE 15.3  FERRITIN 279  TIBC 208*  IRON 31  RETICCTPCT 1.5   Sepsis Labs: No results for input(s): PROCALCITON, LATICACIDVEN in the last 168 hours.  Recent Results (from the past 240 hour(s))  Urine culture     Status: Abnormal   Collection Time: 10/16/18  5:58 PM   Specimen: Urine, Clean Catch   Result Value Ref Range Status   Specimen Description URINE, CLEAN CATCH  Final   Special Requests NONE  Final   Culture (A)  Final    <10,000 COLONIES/mL INSIGNIFICANT GROWTH Performed at [facility], [address], [place], [place] [address]    Report Status 10/17/2018 FINAL  Final  SARS Coronavirus 2 [facility] order, Performed in [facility] hospital lab) Nasopharyngeal Nasopharyngeal Swab     Status: None   Collection Time: 10/20/18  9:51 PM   Specimen: Nasopharyngeal Swab  Result Value Ref Range Status   SARS Coronavirus 2 NEGATIVE NEGATIVE Final    Comment: (NOTE) If result is NEGATIVE SARS-CoV-2 target nucleic acids are NOT DETECTED. The SARS-CoV-2 RNA is generally detectable in upper and lower  respiratory specimens during the acute phase of infection. The lowest  concentration of SARS-CoV-2 viral copies this assay can detect is 250  copies / mL. A negative result does not preclude SARS-CoV-2 infection  and should not be used as the sole basis for treatment or other  patient management decisions.  A negative result may occur with  improper specimen collection / handling, submission of specimen other  than nasopharyngeal swab, presence of viral mutation(s) within the  areas targeted by this assay, and inadequate number of viral copies  (<250 copies / mL). A negative result must be combined with clinical  observations, patient history, and epidemiological information. If result is POSITIVE SARS-CoV-2 target nucleic acids are DETECTED. The SARS-CoV-2 RNA is generally detectable in upper and lower  respiratory specimens dur ing the acute phase of infection.  Positive  results are indicative of active infection with SARS-CoV-2.  Clinical  correlation with patient history and other diagnostic information is  necessary to determine patient infection status.  Positive results do  not rule out bacterial infection or co-infection with other viruses. If result is PRESUMPTIVE  POSTIVE SARS-CoV-2 nucleic acids MAY BE PRESENT.   A presumptive positive result was obtained on the submitted specimen  and confirmed on repeat testing.  While 2019 novel coronavirus  (SARS-CoV-2) nucleic acids may be present in the submitted sample  additional confirmatory testing may be necessary for epidemiological  and / or clinical management purposes  to differentiate between  SARS-CoV-2 and other Sarbecovirus currently known to infect humans.  If clinically indicated additional testing with an alternate test  methodology [phone]) is advised. The SARS-CoV-2 RNA is generally  detectable in upper and lower respiratory sp ecimens during the acute  phase of infection. The expected result is Negative. Fact Sheet for Patients:  StrictlyIdeas.no Fact Sheet for Healthcare Providers: BankingDealers.co.za This test is not yet approved or cleared by the [place] FDA and has been authorized for detection and/or diagnosis of SARS-CoV-2 by FDA under an Emergency Use Authorization (EUA).  This EUA will remain in effect (meaning this test can be used) for the duration of the COVID-19 declaration under Section 564(b)(1) of the Act, 21 U.S.C. section 360bbb-3(b)(1), unless the authorization is terminated or revoked sooner. Performed at [facility], [address] [place], [place] [address]     Radiology Studies: Ct Pelvis Wo Contrast  Result Date: 10/25/2018 CLINICAL DATA:  Presumed diverticular abscess, left lower quadrant pain. Recent CT demonstrated some increase in size of pelvic interloop collections. Drainage requested EXAM: CT PELVIS WITHOUT CONTRAST TECHNIQUE: Multidetector CT imaging of the pelvis was performed following the standard protocol without intravenous contrast. Scanning was obtained both prone and supine RAO. COMPARISON:  10/24/2018 and previous FINDINGS: Urinary Tract:  Urinary bladder partially distended,  unremarkable. Bowel: Visualized loops of some small large bowel are nondilated. Low-attenuation pelvic interloop collections are grossly stable in size, the margin somewhat less conspicuous in the absence of IV contrast. The interloop collections are relatively fixed in position despite changes in patient positioning such that no safe percutaneous window for percutaneous drain placement could be achieved. Vascular/Lymphatic: Scattered aortoiliac atheromatous calcifications without aneurysm. No pelvic adenopathy localized. Reproductive:  No mass or other significant abnormality Other:  Pelvic phleboliths. No free air. Musculoskeletal: No suspicious bone lesions identified. IMPRESSION: 1. No significant change in size or appearance of pelvic interloop collections. Despite variations in patient positioning, no safe percutaneous window for drain placement could be achieved. 2. Aortoiliac  Atherosclerosis (ICD10-I70.0). Electronically Signed   By: Lucrezia Europe M.D.   On: 10/25/2018 10:43   Ct Abdomen Pelvis W Contrast  Result Date: 10/24/2018 CLINICAL DATA:  Left lower quadrant pain, follow-up diverticular abscess EXAM: CT ABDOMEN AND PELVIS WITH CONTRAST TECHNIQUE: Multidetector CT imaging of the abdomen and pelvis was performed using the standard protocol following bolus administration of intravenous contrast. CONTRAST:  127mL OMNIPAQUE IOHEXOL 300 MG/ML  SOLN COMPARISON:  10/20/2018 FINDINGS: Lower chest: Mild left basilar atelectasis is noted. Hepatobiliary: No focal liver abnormality is seen. Status post cholecystectomy. No biliary dilatation. Pancreas: Unremarkable. No pancreatic ductal dilatation or surrounding inflammatory changes. Spleen: Normal in size without focal abnormality. Adrenals/Urinary Tract: Adrenal glands are within normal limits. Cystic changes are noted within the kidneys bilaterally. No obstructive changes are seen. The bladder is partially decompressed. Stomach/Bowel: Colon again demonstrates  changes of inflammation in the region of the sigmoid with changes of diverticulitis. The previously seen abscess is again identified and slightly more organized now measuring 7.7 x 5.0 cm. A smaller adjacent collection is seen in the left hemipelvis which measures 5 cm in greatest dimension. It appears to be contiguous with the larger right-sided collection. Large duodenal diverticulum is noted adjacent to the head of the pancreas. The stomach and small bowel are otherwise within normal limits. Vascular/Lymphatic: Aortic atherosclerosis. No enlarged abdominal or pelvic lymph nodes. Reproductive: Uterus and bilateral adnexa are unremarkable. Other:  No abdominal wall hernia or abnormality. No abdominopelvic ascites. Musculoskeletal: No acute or significant osseous findings. IMPRESSION: Persistent sigmoid diverticulitis with associated abscess. The abscess cavity is better demarcated when compared with the prior exam and has increased in size with a somewhat bilobed appearance. The remainder of the exam is stable from the prior study. Electronically Signed   By: Inez Catalina M.D.   On: 10/24/2018 11:11   Scheduled Meds:  amLODipine  10 mg Oral Daily   And   irbesartan  300 mg Oral Daily   carvedilol  6.25 mg Oral BID WC   [START ON 10/26/2018] enoxaparin (LOVENOX) injection  40 mg Subcutaneous Q24H   Continuous Infusions:  sodium chloride 250 mL (10/20/18 2104)   methocarbamol (ROBAXIN) IV     ondansetron (ZOFRAN) IV     piperacillin-tazobactam (ZOSYN)  IV 3.375 g (10/25/18 0546)    LOS: 5 days   Kerney Elbe, DO [facility] PAGER is on AMION  If 7PM-7AM, please contact night-coverage [email or web address] Password [facility] 10/25/2018, 12:32 PM

## 2018-10-25 NOTE — Sedation Documentation (Addendum)
No window to place drain per Dr. Vernard Gambles.

## 2018-10-26 LAB — CBC WITH DIFFERENTIAL/PLATELET
Abs Immature Granulocytes: 0.14 10*3/uL — ABNORMAL HIGH (ref 0.00–0.07)
Basophils Absolute: 0.1 10*3/uL (ref 0.0–0.1)
Basophils Relative: 1 %
Eosinophils Absolute: 0.1 10*3/uL (ref 0.0–0.5)
Eosinophils Relative: 1 %
HCT: 34.7 % — ABNORMAL LOW (ref 36.0–46.0)
Hemoglobin: 11.3 g/dL — ABNORMAL LOW (ref 12.0–15.0)
Immature Granulocytes: 1 %
Lymphocytes Relative: 20 %
Lymphs Abs: 2.3 10*3/uL (ref 0.7–4.0)
MCH: 30.6 pg (ref 26.0–34.0)
MCHC: 32.6 g/dL (ref 30.0–36.0)
MCV: 94 fL (ref 80.0–100.0)
Monocytes Absolute: 1.2 10*3/uL — ABNORMAL HIGH (ref 0.1–1.0)
Monocytes Relative: 11 %
Neutro Abs: 7.4 10*3/uL (ref 1.7–7.7)
Neutrophils Relative %: 66 %
Platelets: 379 10*3/uL (ref 150–400)
RBC: 3.69 MIL/uL — ABNORMAL LOW (ref 3.87–5.11)
RDW: 13.5 % (ref 11.5–15.5)
WBC: 11.2 10*3/uL — ABNORMAL HIGH (ref 4.0–10.5)
nRBC: 0 % (ref 0.0–0.2)

## 2018-10-26 LAB — COMPREHENSIVE METABOLIC PANEL
ALT: 51 U/L — ABNORMAL HIGH (ref 0–44)
AST: 42 U/L — ABNORMAL HIGH (ref 15–41)
Albumin: 3.2 g/dL — ABNORMAL LOW (ref 3.5–5.0)
Alkaline Phosphatase: 120 U/L (ref 38–126)
Anion gap: 12 (ref 5–15)
BUN: 5 mg/dL — ABNORMAL LOW (ref 6–20)
CO2: 23 mmol/L (ref 22–32)
Calcium: 9.1 mg/dL (ref 8.9–10.3)
Chloride: 104 mmol/L (ref 98–111)
Creatinine, Ser: 0.94 mg/dL (ref 0.44–1.00)
GFR calc Af Amer: >60 mL/min (ref >60)
GFR calc non Af Amer: >60 mL/min (ref >60)
Glucose, Bld: 91 mg/dL (ref 70–99)
Potassium: 4 mmol/L (ref 3.5–5.1)
Sodium: 139 mmol/L (ref 135–145)
Total Bilirubin: 0.6 mg/dL (ref 0.3–1.2)
Total Protein: 7.6 g/dL (ref 6.5–8.1)

## 2018-10-26 LAB — PHOSPHORUS: Phosphorus: 4.8 mg/dL — ABNORMAL HIGH (ref 2.5–4.6)

## 2018-10-26 LAB — GLUCOSE, CAPILLARY: Glucose-Capillary: 88 mg/dL (ref 70–99)

## 2018-10-26 LAB — MAGNESIUM: Magnesium: 2.1 mg/dL (ref 1.7–2.4)

## 2018-10-26 MED ORDER — FLUCONAZOLE 150 MG PO TABS
150.0000 mg | ORAL_TABLET | Freq: Once | ORAL | Status: AC
  Administered 2018-10-26: 150 mg via ORAL
  Filled 2018-10-26: qty 1

## 2018-10-26 NOTE — Progress Notes (Signed)
[facility]/Trauma Progress Note      Assessment/Plan HTN Type 2 diabetes Depression Anxiety  Acute sigmoid diverticulitis with perforation and pericolonic abscess 7X2.7X 3.6cm -IR unable to access abscess -Continue IV antibiotics - will try to advance diet as see how see feels - may need lap abscess drainage or Hartman's  -Ambulate   FEN:CLD,IVF VTE: SCD's, Lovenox [id number] Foley:None Follow up:TBD   LOS: 6 days    Subjective: CC: Abdominal pain  Pain unchanged from yesterday.  No nausea vomiting overnight.   Objective: Vital signs in last 24 hours: Temp:  [97.9 F (36.6 C)-98.6 F (37 C)] 98.4 F (36.9 C) (09/13 0504) Pulse Rate:  [60-80] 64 (09/13 0504) Resp:  [18-19] 18 (09/13 0504) BP: (130-166)/(72-90) 133/90 (09/13 0504) SpO2:  [97 %-100 %] 99 % (09/13 0504) Weight:  [67.6 kg] 67.6 kg (09/13 0504) Last BM Date: 10/23/18  Intake/Output from previous day: 09/12 0701 - 09/13 0700 In: 1259.7 [P.O.:960; IV Piggyback:299.7] Out: 1800 [Urine:1800] Intake/Output this shift: No intake/output data recorded.  PE: Gen: Alert, NAD, pleasant, cooperative, well-appearing Pulm:Rate andeffort normal Abd: Soft,TTP of suprapubic region and LLQ without guarding, no peritonitis Skin: no rashes noted, warm and dry  Anti-infectives: Anti-infectives (From admission, onward)   Start     Dose/Rate Route Frequency Ordered Stop   10/20/18 2115  piperacillin-tazobactam (ZOSYN) IVPB 3.375 g     3.375 g 12.5 mL/hr over 240 Minutes Intravenous Every 8 hours 10/20/18 2107     10/20/18 2100  piperacillin-tazobactam (ZOSYN) IVPB 3.375 g  Status:  Discontinued     3.375 g 100 mL/hr over 30 Minutes Intravenous  Once 10/20/18 2052 10/21/18 0105      Lab Results:  Recent Labs    10/25/18 0330 10/26/18 0419  WBC 11.9* 11.2*  HGB 11.2* 11.3*  HCT 33.8* 34.7*  PLT 367 379   BMET Recent Labs    10/25/18 0330 10/26/18 0419  NA 139  139  K 3.8 4.0  CL 104 104  CO2 26 23  GLUCOSE 106* 91  BUN <5* 5*  CREATININE 0.89 0.94  CALCIUM 8.8* 9.1   PT/INR No results for input(s): LABPROT, INR in the last 72 hours. CMP     Component Value Date/Time   NA 139 10/26/2018 0419   K 4.0 10/26/2018 0419   CL 104 10/26/2018 0419   CO2 23 10/26/2018 0419   GLUCOSE 91 10/26/2018 0419   BUN 5 (L) 10/26/2018 0419   CREATININE 0.94 10/26/2018 0419   CREATININE 1.03 04/19/2017 1617   CALCIUM 9.1 10/26/2018 0419   PROT 7.6 10/26/2018 0419   ALBUMIN 3.2 (L) 10/26/2018 0419   AST 42 (H) 10/26/2018 0419   ALT 51 (H) 10/26/2018 0419   ALKPHOS 120 10/26/2018 0419   BILITOT 0.6 10/26/2018 0419   GFRNONAA >60 10/26/2018 0419   GFRNONAA 61 08/25/2013 1129   GFRAA >60 10/26/2018 0419   GFRAA 71 08/25/2013 1129   Lipase  No results found for: LIPASE  Studies/Results: Ct Pelvis Wo Contrast  Result Date: 10/25/2018 CLINICAL DATA:  Presumed diverticular abscess, left lower quadrant pain. Recent CT demonstrated some increase in size of pelvic interloop collections. Drainage requested EXAM: CT PELVIS WITHOUT CONTRAST TECHNIQUE: Multidetector CT imaging of the pelvis was performed following the standard protocol without intravenous contrast. Scanning was obtained both prone and supine RAO. COMPARISON:  10/24/2018 and previous FINDINGS: Urinary Tract:  Urinary bladder partially distended, unremarkable. Bowel: Visualized loops of some small large bowel are nondilated. Low-attenuation pelvic  interloop collections are grossly stable in size, the margin somewhat less conspicuous in the absence of IV contrast. The interloop collections are relatively fixed in position despite changes in patient positioning such that no safe percutaneous window for percutaneous drain placement could be achieved. Vascular/Lymphatic: Scattered aortoiliac atheromatous calcifications without aneurysm. No pelvic adenopathy localized. Reproductive:  No mass or other  significant abnormality Other:  Pelvic phleboliths. No free air. Musculoskeletal: No suspicious bone lesions identified. IMPRESSION: 1. No significant change in size or appearance of pelvic interloop collections. Despite variations in patient positioning, no safe percutaneous window for drain placement could be achieved. 2. Aortoiliac  Atherosclerosis (ICD10-I70.0). Electronically Signed   By: Lucrezia Europe M.D.   On: 10/25/2018 10:43   Ct Abdomen Pelvis W Contrast  Result Date: 10/24/2018 CLINICAL DATA:  Left lower quadrant pain, follow-up diverticular abscess EXAM: CT ABDOMEN AND PELVIS WITH CONTRAST TECHNIQUE: Multidetector CT imaging of the abdomen and pelvis was performed using the standard protocol following bolus administration of intravenous contrast. CONTRAST:  122mL OMNIPAQUE IOHEXOL 300 MG/ML  SOLN COMPARISON:  10/20/2018 FINDINGS: Lower chest: Mild left basilar atelectasis is noted. Hepatobiliary: No focal liver abnormality is seen. Status post cholecystectomy. No biliary dilatation. Pancreas: Unremarkable. No pancreatic ductal dilatation or surrounding inflammatory changes. Spleen: Normal in size without focal abnormality. Adrenals/Urinary Tract: Adrenal glands are within normal limits. Cystic changes are noted within the kidneys bilaterally. No obstructive changes are seen. The bladder is partially decompressed. Stomach/Bowel: Colon again demonstrates changes of inflammation in the region of the sigmoid with changes of diverticulitis. The previously seen abscess is again identified and slightly more organized now measuring 7.7 x 5.0 cm. A smaller adjacent collection is seen in the left hemipelvis which measures 5 cm in greatest dimension. It appears to be contiguous with the larger right-sided collection. Large duodenal diverticulum is noted adjacent to the head of the pancreas. The stomach and small bowel are otherwise within normal limits. Vascular/Lymphatic: Aortic atherosclerosis. No enlarged  abdominal or pelvic lymph nodes. Reproductive: Uterus and bilateral adnexa are unremarkable. Other: No abdominal wall hernia or abnormality. No abdominopelvic ascites. Musculoskeletal: No acute or significant osseous findings. IMPRESSION: Persistent sigmoid diverticulitis with associated abscess. The abscess cavity is better demarcated when compared with the prior exam and has increased in size with a somewhat bilobed appearance. The remainder of the exam is stable from the prior study. Electronically Signed   By: Inez Catalina M.D.   On: [id number]      Rosario Adie, MD  [facility]

## 2018-10-26 NOTE — Plan of Care (Signed)
Patient lying in bed this morning; states no pain or nausea at this time. No needs expressed. Will continue to monitor.

## 2018-10-26 NOTE — Progress Notes (Signed)
PROGRESS NOTE    Kaitlyn Harper  [id number] DOB: 10-22-1960 DOA: 10/20/2018 PCP: Debbrah Alar, NP   Brief Narrative:  HPI per Dr. Mitzi Hansen on 10/20/2018 Kaitlyn Harper is a 58 y.o. female with medical history significant for hypertension, diet-controlled diabetes mellitus, hyperlipidemia, depression, and anxiety, now presenting to the emergency department for evaluation of abdominal pain.  Patient developed lower abdominal discomfort, fevers, chills, and nausea without vomiting approximately 1 week ago, was suspected to have UTI, was given a dose of Rocephin and started on Levaquin on 10/16/2018, continued to feel poorly with ongoing lower abdominal pain, was noted to have leukocytosis greater than 20,000 on outpatient blood work, and was directed to the ED for further evaluation of this.  She has had some loose stools without any melena or hematochezia, has nausea and loss of appetite, but no vomiting.  She had never experienced these symptoms previously.  Denies cough or shortness of breath.  [facility] ED Course: Upon arrival to the ED, patient is found to be afebrile, saturating well on room air, slightly tachycardic, and with stable blood pressure.  Chemistry panel is notable for potassium of 3.0 and mild elevation in transaminases.  CBC features a leukocytosis to 20,800.  CT of the abdomen and pelvis is concerning for acute sigmoid diverticulitis with perforation and multilocular pericolonic abscess.  COVID-19 is negative. Patient was given a liter of lactated Ringer's, Zofran, Dilaudid, and Zosyn in the emergency department.  Surgery was consulted by the ED physician and recommended medical admission.  **Interim History  Surgery evaluated and recommended IR to evaluate for possible drain placement however there is no safe window for drain to be placed and they recommended a follow-up CT which is was done. Will Continue with IV antibiotics and bowel rest but Surgery  advanced diet to CLD and now Soft Diet and recommending to continue ambulating.  General surgery still hoped the patient can have abscess drained after repeat CT scan but unfortunately there is no safe window to place drain per Dr. Vernard Gambles. Patient is having flatus but currently having a bowel movement.  Continues to have some pain but is improved.  We will evaluate how the patient does on a Soft diet and general surgery thinks that she still may need a laparoscopic abscess drainage for Hartman's procedure.  Assessment & Plan:   Principal Problem:   Diverticulitis of large intestine with perforation and abscess Active Problems:   Essential hypertension   Hyperlipidemia   Tobacco abuse   Hypokalemia   Impaired glucose tolerance   Depressive disorder   Anemia   Anxiety   AV block, 1st degree   Acute Diverticulitis with perforation and abscess -Presents with persistent lower abdominal pain, fevers, and nausea despite treatment for suspected UTI, and is found to have acute sigmoid diverticulitis with perforation and abscess -She has been hemodynamically stable -Surgery is consulting and appreciate further evaluation and recommendations -IR has been consulted and today said that they are unable to safely drain the abscess due to it's positioning initially and even with Repeat CT Scan  -Temps improving 101 and TMax wa 99.5, WBC slightly down 22.6 -> 17.2 -> 15.9 -> 14.5 -> 11.9 -> 11.2 -Diet was advanced to Clear Liquid Diet and will continue -IV fluids have been now changed to sodium bicarbonate 150 mEq in D5 rate of 75 mL's per hour and now stopped  -C/w IV Zosyn for Abx Coverage and with Pain-control with IV Hydromorphone 0.5-1 mg IV  [id number] Moderate and Severe Pain and also continue with IV methocarbamol 1000 mg every 6 hours PRN for muscle spasm -IVF hydration now discontinued  -Antiemeticswith IV Zofran  -Further care per general surgery and appreciate further recommendations;    -General Surgery repeated CT scan of the Abdomen/Pelvis today Friday, 10/24/2018 and showed "No significant change in size or appearance of pelvic interloop collections. Despite variations in patient positioning, no safe percutaneous window for drain placement could be achieved. Aortoiliac  Atherosclerosis." -Diet advanced per general surgery and she is now on a Soft Diet -General surgery feeling she still may require laparoscopic drainage of her abscess or a Hartman's procedure but will want to see how she tolerates a soft diet first. -They recommend continue IV antibiotics and continue monitoring.  Hypertension -BP at goal in 120/80 range on admission but now currently 133/90 -Continuef holding home ARB/ CCB and coregas she was was not taking anything by p.o. but will resume now that she is on a clear liquid diet -Resumed Amlodipine 10 mg po Daily and Irbesartan 300 mg po Daily; C/w Carvedilol 6.25 mg po BID -Use labetalol IVP's as needed while NPO; currently on IV labetalol 10 mg every 2 as needed for systolic blood pressure around [id number] or diastolic pressure than [id number]  Type II DM -Diet-controlled at home, serum glucose is 108 in EDand repeat this morning was 91 - BS's are stable and CBGs have been ranging from 84-110 and Daily blood sugars on BMP/CMP  have been ranging from 90-136  -Monitoring CBG's qam, holding off on SSI for now and will continue to monitor carefully  Abnormal LFTs, was improving and now slightly worsened  -Unclear Etiology but suspect in the setting of Diverticulitis and trending down -Patient's AST went from 67 -> 47 -> 45 -> 35 -> 29 -> 42 -Patient's ALT went from 92 -> 74 -> 67 -> 47 -> 44 -> 51 -Continue to Monitor and Trend and if worsening mroe or not improving will obtain a RUQ U/S and a Acute Hepatitis Panel -Continue to Hold Statin  -Repeat CMP in AM   Hyperbilirubinemia, improving -Likely reactive -Patient's T bili 0.9 is now 1.8 -> 1.5 -> 1.0 ->  0.9 -> 0.6 -Continue to monitor and Trend -Repeat CMP in a.m.  Overweight -Estimated body mass index is 25.58 kg/m as calculated from the following:   Height as of this encounter: 5\' 4"  (1.626 m).   Weight as of this encounter: 67.6 kg. -Weight Loss and Dietary Counseling given   Metabolic Acidosis, Improved -Pateint's CO2 was 21, AG was 10 and Chlroide was 108; Now CO2 is 23, AG is 12, and Chloride is 104 -IVF now stopped  -Continue to Monitor and Trend -Repeat CMP in AM   Normocytic Anemia, slightly decreasing  -Patient's Hb/Hct stable is now 11.3/34.7 -Checked Anemia Panel and showed an iron level of 31, U IBC 177, TIBC of 208, saturation ratios of 15%, ferritin level 279, folate level 15.3, and vitamin B12 level of 2845 -Continue to Monitor for S/Sx of Bleeding; Currently no overt Bleeding noted -Repeat CBC in AM   Hypokalemia, improved -Patient potassium this morning was 4.0 -Continue to monitor and replete as necessary -Repeat CMP in AM  DVT prophylaxis: SCDs Code Status: FULL CODE Family Communication: No family present at bedside  Disposition Plan: Pending improvement and clearance by General Surgery  Consultants:   General Surgery  Interventional Radiology    Procedures: None  Antimicrobials:  Anti-infectives (From admission, onward)  Start     Dose/Rate Route Frequency Ordered Stop   10/20/18 2115  piperacillin-tazobactam (ZOSYN) IVPB 3.375 g     3.375 g 12.5 mL/hr over 240 Minutes Intravenous Every 8 hours 10/20/18 2107     10/20/18 2100  piperacillin-tazobactam (ZOSYN) IVPB 3.375 g  Status:  Discontinued     3.375 g 100 mL/hr over 30 Minutes Intravenous  Once 10/20/18 2052 10/21/18 0105     Subjective: Seen and examined at bedside had no complaints.  States that she is passing gas.  States that she had no nausea or vomiting.  No other concerns or complaints this time.  Objective: Vitals:   10/25/18 0930 10/25/18 1325 10/25/18 2029 10/26/18 0504   BP: (!) 166/85 137/79 130/72 133/90  Pulse: 80 64 60 64  Resp: 19 18 18 18   Temp:  97.9 F (36.6 C) 98.6 F (37 C) 98.4 F (36.9 C)  TempSrc:  Oral Oral Oral  SpO2: 100% 99% 97% 99%  Weight:    67.6 kg  Height:        Intake/Output Summary (Last 24 hours) at 10/26/2018 1305 Last data filed at 10/26/2018 1000 Gross per 24 hour  Intake 900 ml  Output 1400 ml  Net -500 ml   Filed Weights   10/24/18 0510 10/25/18 0500 10/26/18 0504  Weight: 71.5 kg 70.4 kg 67.6 kg   Examination: Physical Exam:  Constitutional: WN/WD overweight African-American female currently in NAD and appears calm and comfortable Eyes: Lids and conjunctivae normal, sclerae anicteric  ENMT: External Ears, Nose appear normal. Grossly normal hearing. Mucous membranes are moist.  Neck: Appears normal, supple, no cervical masses, normal ROM, no appreciable thyromegaly; no JVD Respiratory: Diminished to auscultation bilaterally, no wheezing, rales, rhonchi or crackles. Normal respiratory effort and patient is not tachypenic. No accessory muscle use.  Cardiovascular: RRR, no murmurs / rubs / gallops. S1 and S2 auscultated. No extremity edema.  Abdomen: Soft, non-tender, Mildly distended. No masses palpated. No appreciable hepatosplenomegaly. Bowel sounds positive x4.  GU: Deferred. Musculoskeletal: No clubbing / cyanosis of digits/nails. No joint deformity upper and lower extremities.  Skin: No rashes, lesions, ulcers on a limited skin evaluation. No induration; Warm and dry.  Neurologic: CN 2-12 grossly intact with no focal deficits. Romberg sign and cerebellar reflexes not assessed.  Psychiatric: Normal judgment and insight. Alert and oriented x 3. Normal mood and appropriate affect.   Data Reviewed: I have personally reviewed following labs and imaging studies  CBC: Recent Labs  Lab 10/20/18 1942 10/21/18 0300 10/22/18 0819 10/23/18 0335 10/24/18 0815 10/25/18 0330 10/26/18 0419  WBC 20.8* 19.9* 17.2*  15.9* 14.5* 11.9* 11.2*  NEUTROABS 15.3* 14.0*  --  12.1*  --  8.5* 7.4  HGB 12.8 11.4* 11.2* 10.9* 11.2* 11.2* 11.3*  HCT 38.9 34.1* 34.6* 33.0* 33.4* 33.8* 34.7*  MCV 93.1 94.2 96.9 95.7 94.1 94.2 94.0  PLT 366 350 382 336 360 367 [id number]   Basic Metabolic Panel: Recent Labs  Lab 10/22/18 0844 10/23/18 0335 10/24/18 0815 10/25/18 0330 10/26/18 0419  NA 140 139 136 139 139  K 4.2 4.1 3.1* 3.8 4.0  CL 111 111 100 104 104  CO2 21* 17* 25 26 23   GLUCOSE 90 125* 136* 106* 91  BUN 9 8 <5* <5* 5*  CREATININE 0.89 0.84 0.83 0.89 0.94  CALCIUM 8.9 8.3* 8.3* 8.8* 9.1  MG 2.1 2.0 2.0 2.1 2.1  PHOS 3.1 2.9 3.2 4.0 4.8*   GFR: Estimated Creatinine Clearance: 60.2 mL/min (by  C-G formula based on SCr of 0.94 mg/dL). Liver Function Tests: Recent Labs  Lab 10/22/18 0844 10/23/18 0335 10/24/18 0815 10/25/18 0330 10/26/18 0419  AST 45* 32 35 29 42*  ALT 67* 53* 47* 44 51*  ALKPHOS 164* 142* 127* 113 120  BILITOT 1.8* 1.5* 1.0 0.9 0.6  PROT 7.1 7.3 7.3 7.1 7.6  ALBUMIN 3.1* 3.0* 3.1* 3.1* 3.2*   No results for input(s): LIPASE, AMYLASE in the last 168 hours. No results for input(s): AMMONIA in the last 168 hours. Coagulation Profile: Recent Labs  Lab 10/20/18 2138  INR 1.1   Cardiac Enzymes: No results for input(s): CKTOTAL, CKMB, CKMBINDEX, TROPONINI in the last 168 hours. BNP (last 3 results) No results for input(s): PROBNP in the last 8760 hours. HbA1C: No results for input(s): HGBA1C in the last 72 hours. CBG: Recent Labs  Lab 10/22/18 0733 10/23/18 0809 10/24/18 0745 10/25/18 0755 10/26/18 0732  GLUCAP 84 105* 137* 109* 88   Lipid Profile: No results for input(s): CHOL, HDL, LDLCALC, TRIG, CHOLHDL, LDLDIRECT in the last 72 hours. Thyroid Function Tests: No results for input(s): TSH, T4TOTAL, FREET4, T3FREE, THYROIDAB in the last 72 hours. Anemia Panel: No results for input(s): VITAMINB12, FOLATE, FERRITIN, TIBC, IRON, RETICCTPCT in the last 72 hours. Sepsis  Labs: No results for input(s): PROCALCITON, LATICACIDVEN in the last 168 hours.  Recent Results (from the past 240 hour(s))  Urine culture     Status: Abnormal   Collection Time: 10/16/18  5:58 PM   Specimen: Urine, Clean Catch  Result Value Ref Range Status   Specimen Description URINE, CLEAN CATCH  Final   Special Requests NONE  Final   Culture (A)  Final    <10,000 COLONIES/mL INSIGNIFICANT GROWTH Performed at [facility], [address], [place], [place] [address]    Report Status 10/17/2018 FINAL  Final  SARS Coronavirus 2 [facility] order, Performed in [facility] hospital lab) Nasopharyngeal Nasopharyngeal Swab     Status: None   Collection Time: 10/20/18  9:51 PM   Specimen: Nasopharyngeal Swab  Result Value Ref Range Status   SARS Coronavirus 2 NEGATIVE NEGATIVE Final    Comment: (NOTE) If result is NEGATIVE SARS-CoV-2 target nucleic acids are NOT DETECTED. The SARS-CoV-2 RNA is generally detectable in upper and lower  respiratory specimens during the acute phase of infection. The lowest  concentration of SARS-CoV-2 viral copies this assay can detect is 250  copies / mL. A negative result does not preclude SARS-CoV-2 infection  and should not be used as the sole basis for treatment or other  patient management decisions.  A negative result may occur with  improper specimen collection / handling, submission of specimen other  than nasopharyngeal swab, presence of viral mutation(s) within the  areas targeted by this assay, and inadequate number of viral copies  (<250 copies / mL). A negative result must be combined with clinical  observations, patient history, and epidemiological information. If result is POSITIVE SARS-CoV-2 target nucleic acids are DETECTED. The SARS-CoV-2 RNA is generally detectable in upper and lower  respiratory specimens dur ing the acute phase of infection.  Positive  results are indicative of active infection with SARS-CoV-2.  Clinical   correlation with patient history and other diagnostic information is  necessary to determine patient infection status.  Positive results do  not rule out bacterial infection or co-infection with other viruses. If result is PRESUMPTIVE POSTIVE SARS-CoV-2 nucleic acids MAY BE PRESENT.   A presumptive positive result was  obtained on the submitted specimen  and confirmed on repeat testing.  While 2019 novel coronavirus  (SARS-CoV-2) nucleic acids may be present in the submitted sample  additional confirmatory testing may be necessary for epidemiological  and / or clinical management purposes  to differentiate between  SARS-CoV-2 and other Sarbecovirus currently known to infect humans.  If clinically indicated additional testing with an alternate test  methodology [phone]) is advised. The SARS-CoV-2 RNA is generally  detectable in upper and lower respiratory sp ecimens during the acute  phase of infection. The expected result is Negative. Fact Sheet for Patients:  StrictlyIdeas.no Fact Sheet for Healthcare Providers: BankingDealers.co.za This test is not yet approved or cleared by the [place] FDA and has been authorized for detection and/or diagnosis of SARS-CoV-2 by FDA under an Emergency Use Authorization (EUA).  This EUA will remain in effect (meaning this test can be used) for the duration of the COVID-19 declaration under Section 564(b)(1) of the Act, 21 U.S.C. section 360bbb-3(b)(1), unless the authorization is terminated or revoked sooner. Performed at [id number], [address], [place], [place] [address]     Radiology Studies: Ct Pelvis Wo Contrast  Result Date: 10/25/2018 CLINICAL DATA:  Presumed diverticular abscess, left lower quadrant pain. Recent CT demonstrated some increase in size of pelvic interloop collections. Drainage requested EXAM: CT PELVIS WITHOUT CONTRAST TECHNIQUE: Multidetector CT imaging of  the pelvis was performed following the standard protocol without intravenous contrast. Scanning was obtained both prone and supine RAO. COMPARISON:  10/24/2018 and previous FINDINGS: Urinary Tract:  Urinary bladder partially distended, unremarkable. Bowel: Visualized loops of some small large bowel are nondilated. Low-attenuation pelvic interloop collections are grossly stable in size, the margin somewhat less conspicuous in the absence of IV contrast. The interloop collections are relatively fixed in position despite changes in patient positioning such that no safe percutaneous window for percutaneous drain placement could be achieved. Vascular/Lymphatic: Scattered aortoiliac atheromatous calcifications without aneurysm. No pelvic adenopathy localized. Reproductive:  No mass or other significant abnormality Other:  Pelvic phleboliths. No free air. Musculoskeletal: No suspicious bone lesions identified. IMPRESSION: 1. No significant change in size or appearance of pelvic interloop collections. Despite variations in patient positioning, no safe percutaneous window for drain placement could be achieved. 2. Aortoiliac  Atherosclerosis (ICD10-I70.0). Electronically Signed   By: Lucrezia Europe M.D.   On: 10/25/2018 10:43   Scheduled Meds:  amLODipine  10 mg Oral Daily   And   irbesartan  300 mg Oral Daily   carvedilol  6.25 mg Oral BID WC   enoxaparin (LOVENOX) injection  40 mg Subcutaneous Q24H   Continuous Infusions:  sodium chloride 250 mL (10/20/18 2104)   methocarbamol (ROBAXIN) IV     ondansetron (ZOFRAN) IV     piperacillin-tazobactam (ZOSYN)  IV 3.375 g (10/26/18 0507)    LOS: 6 days   Kerney Elbe, DO [facility] PAGER is on [place]  If 7PM-7AM, please contact night-coverage [email or web address] Password [email or web address] 10/26/2018, 1:05 PM

## 2018-10-27 DIAGNOSIS — N949 Unspecified condition associated with female genital organs and menstrual cycle: Secondary | ICD-10-CM

## 2018-10-27 DIAGNOSIS — N289 Disorder of kidney and ureter, unspecified: Secondary | ICD-10-CM

## 2018-10-27 LAB — URINALYSIS, ROUTINE W REFLEX MICROSCOPIC
Bilirubin Urine: NEGATIVE
Glucose, UA: NEGATIVE mg/dL
Hgb urine dipstick: NEGATIVE
Ketones, ur: NEGATIVE mg/dL
Leukocytes,Ua: NEGATIVE
Nitrite: NEGATIVE
Protein, ur: NEGATIVE mg/dL
Specific Gravity, Urine: 1.02 (ref 1.005–1.030)
pH: 6 (ref 5.0–8.0)

## 2018-10-27 LAB — RAPID URINE DRUG SCREEN, HOSP PERFORMED
Amphetamines: NOT DETECTED
Barbiturates: NOT DETECTED
Benzodiazepines: NOT DETECTED
Cocaine: NOT DETECTED
Opiates: NOT DETECTED
Tetrahydrocannabinol: POSITIVE — AB

## 2018-10-27 LAB — CBC WITH DIFFERENTIAL/PLATELET
Abs Immature Granulocytes: 0.13 10*3/uL — ABNORMAL HIGH (ref 0.00–0.07)
Basophils Absolute: 0.1 10*3/uL (ref 0.0–0.1)
Basophils Relative: 1 %
Eosinophils Absolute: 0.1 10*3/uL (ref 0.0–0.5)
Eosinophils Relative: 1 %
HCT: 36.2 % (ref 36.0–46.0)
Hemoglobin: 11.6 g/dL — ABNORMAL LOW (ref 12.0–15.0)
Immature Granulocytes: 1 %
Lymphocytes Relative: 24 %
Lymphs Abs: 2.5 10*3/uL (ref 0.7–4.0)
MCH: 30.6 pg (ref 26.0–34.0)
MCHC: 32 g/dL (ref 30.0–36.0)
MCV: 95.5 fL (ref 80.0–100.0)
Monocytes Absolute: 1.1 10*3/uL — ABNORMAL HIGH (ref 0.1–1.0)
Monocytes Relative: 10 %
Neutro Abs: 6.7 10*3/uL (ref 1.7–7.7)
Neutrophils Relative %: 63 %
Platelets: 395 10*3/uL (ref 150–400)
RBC: 3.79 MIL/uL — ABNORMAL LOW (ref 3.87–5.11)
RDW: 13.5 % (ref 11.5–15.5)
WBC: 10.6 10*3/uL — ABNORMAL HIGH (ref 4.0–10.5)
nRBC: 0 % (ref 0.0–0.2)

## 2018-10-27 LAB — COMPREHENSIVE METABOLIC PANEL
ALT: 68 U/L — ABNORMAL HIGH (ref 0–44)
AST: 62 U/L — ABNORMAL HIGH (ref 15–41)
Albumin: 3.3 g/dL — ABNORMAL LOW (ref 3.5–5.0)
Alkaline Phosphatase: 109 U/L (ref 38–126)
Anion gap: 11 (ref 5–15)
BUN: 9 mg/dL (ref 6–20)
CO2: 22 mmol/L (ref 22–32)
Calcium: 9 mg/dL (ref 8.9–10.3)
Chloride: 105 mmol/L (ref 98–111)
Creatinine, Ser: 1.01 mg/dL — ABNORMAL HIGH (ref 0.44–1.00)
GFR calc Af Amer: >60 mL/min (ref >60)
GFR calc non Af Amer: >60 mL/min (ref >60)
Glucose, Bld: 95 mg/dL (ref 70–99)
Potassium: 3.8 mmol/L (ref 3.5–5.1)
Sodium: 138 mmol/L (ref 135–145)
Total Bilirubin: 0.7 mg/dL (ref 0.3–1.2)
Total Protein: 7.5 g/dL (ref 6.5–8.1)

## 2018-10-27 LAB — MAGNESIUM: Magnesium: 2.3 mg/dL (ref 1.7–2.4)

## 2018-10-27 LAB — GLUCOSE, CAPILLARY: Glucose-Capillary: 95 mg/dL (ref 70–99)

## 2018-10-27 LAB — PHOSPHORUS: Phosphorus: 4.5 mg/dL (ref 2.5–4.6)

## 2018-10-27 MED ORDER — SODIUM CHLORIDE 0.9 % IV SOLN
INTRAVENOUS | Status: AC
  Administered 2018-10-27: 11:00:00 via INTRAVENOUS

## 2018-10-27 NOTE — Progress Notes (Signed)
PROGRESS NOTE    Kaitlyn Harper  [id number] DOB: May 18, 1960 DOA: 10/20/2018 PCP: Debbrah Alar, NP   Brief Narrative:  HPI per Dr. Mitzi Hansen on 10/20/2018 Kaitlyn Harper is a 58 y.o. female with medical history significant for hypertension, diet-controlled diabetes mellitus, hyperlipidemia, depression, and anxiety, now presenting to the emergency department for evaluation of abdominal pain.  Patient developed lower abdominal discomfort, fevers, chills, and nausea without vomiting approximately 1 week ago, was suspected to have UTI, was given a dose of Rocephin and started on Levaquin on 10/16/2018, continued to feel poorly with ongoing lower abdominal pain, was noted to have leukocytosis greater than 20,000 on outpatient blood work, and was directed to the ED for further evaluation of this.  She has had some loose stools without any melena or hematochezia, has nausea and loss of appetite, but no vomiting.  She had never experienced these symptoms previously.  Denies cough or shortness of breath.  [facility] ED Course: Upon arrival to the ED, patient is found to be afebrile, saturating well on room air, slightly tachycardic, and with stable blood pressure.  Chemistry panel is notable for potassium of 3.0 and mild elevation in transaminases.  CBC features a leukocytosis to 20,800.  CT of the abdomen and pelvis is concerning for acute sigmoid diverticulitis with perforation and multilocular pericolonic abscess.  COVID-19 is negative. Patient was given a liter of lactated Ringer's, Zofran, Dilaudid, and Zosyn in the emergency department.  Surgery was consulted by the ED physician and recommended medical admission.  **Interim History  Surgery evaluated and recommended IR to evaluate for possible drain placement however there is no safe window for drain to be placed and they recommended a follow-up CT which is was done. Will Continue with IV antibiotics and bowel rest but Surgery  advanced diet to CLD and now Soft Diet and recommending to continue ambulating.  General surgery still hoped the patient can have abscess drained after repeat CT scan but unfortunately there is no safe window to place drain per Dr. Vernard Gambles. Patient is having flatus but currently having a bowel movement.  Continues to have some pain but is improved.  We will evaluate how the patient does on a Soft diet and general surgery thinks that she still may need a laparoscopic abscess drainage for Hartman's procedure.  Currently surgery as not decided what they want to do and will await for surgical input as patient is now tolerating her regular diet without increase in pain.  Assessment & Plan:   Principal Problem:   Diverticulitis of large intestine with perforation and abscess Active Problems:   Essential hypertension   Hyperlipidemia   Tobacco abuse   Hypokalemia   Impaired glucose tolerance   Depressive disorder   Anemia   Anxiety   AV block, 1st degree   Acute Diverticulitis with perforation and abscess -Presents with persistent lower abdominal pain, fevers, and nausea despite treatment for suspected UTI, and is found to have acute sigmoid diverticulitis with perforation and abscess -She has been hemodynamically stable -Surgery is consulting and appreciate further evaluation and recommendations -IR has been consulted and today said that they are unable to safely drain the abscess due to it's positioning initially and even with Repeat CT Scan  -Temps improving 101 and TMax wa 99.5, WBC slightly down 22.6 -> 10.6 -Diet was advanced to Clear Liquid Diet and will continue -IV fluids have been now changed to sodium bicarbonate 150 mEq in D5 rate of 75 mL's  per hour and now stopped  -C/w IV Zosyn for Abx Coverage and with Pain-control with IV Hydromorphone 0.5-1 mg IV q3hprn Moderate and Severe Pain and also continue with IV methocarbamol 1000 mg every 6 hours PRN for muscle spasm -IVF hydration  now restarted at 75 mL/hr for 12 hours -Antiemeticswith IV Zofran  -Further care per general surgery and appreciate further recommendations;  -General Surgery repeated CT scan of the Abdomen/Pelvis today Friday, 10/24/2018 and showed "No significant change in size or appearance of pelvic interloop collections. Despite variations in patient positioning, no safe percutaneous window for drain placement could be achieved. Aortoiliac  Atherosclerosis." -Diet advanced per general surgery and she is now on a Soft Diet -General surgery feeling she still may require laparoscopic drainage of her abscess or a Hartman's procedure but will want to see how she tolerates a soft diet first.  Currently she is tolerating her soft diet without increase in pain and will defer to surgery on what they want to do -For now they are recommending continue IV antibiotics and continue monitoring.  Hypertension -BP at goal in 120/80 range on admission but now currently 131/76 -Continuef holding home ARB/ CCB and coregas she was was not taking anything by p.o. but will resume now that she is on a clear liquid diet -Resumed Amlodipine 10 mg po Daily and Irbesartan 300 mg po Daily; C/w Carvedilol 6.25 mg po BID -Use labetalol IVP's as needed while NPO; currently on IV labetalol 10 mg every 2 as needed for systolic blood pressure around [id number] or diastolic pressure than [id number]  Type II DM -Diet-controlled at home, serum glucose is 108 in EDand repeat this morning was 95 - BS's are stable and CBGs have been ranging from 84-110 and Daily blood sugars on BMP/CMP  have been ranging from 90-136  -Monitoring CBG's qam, holding off on SSI for now and will continue to monitor carefully  Abnormal LFTs, was improving and now slightly worsened  -Unclear Etiology but suspect in the setting of Diverticulitis and trending down -Patient's AST went from 67 -> 47 -> 45 -> 35 -> 29 -> 42 -> 62 -Patient's ALT went from 92 -> 74 -> 67 -> 47  -> 44 -> 51 -> 68 -Continue to Monitor and Trend and if worsening mroe or not improving will obtain a RUQ U/S and a Acute Hepatitis Panel -Continue to Hold Statin  -Repeat CMP in AM   Hyperbilirubinemia, improving -Likely reactive -Patient's T bili 0.9 is now 1.8 -> 1.5 -> 1.0 -> 0.9 -> 0.6 -> 0.7 -Continue to monitor and Trend -Repeat CMP in a.m.  Overweight -Estimated body mass index is 25.68 kg/m as calculated from the following:   Height as of this encounter: 5\' 4"  (1.626 m).   Weight as of this encounter: 67.9 kg. -Weight Loss and Dietary Counseling given   Metabolic Acidosis, Improved -Pateint's CO2 was 21, AG was 10 and Chlroide was 108; Now CO2 is 22, AG is 11, and Chloride is 105 -IVF now resumed given mild Renal Insuffiencey  -Continue to Monitor and Trend -Repeat CMP in AM   Normocytic Anemia, slightly decreasing  -Patient's Hb/Hct stable is now 11.6/36.2 -Checked Anemia Panel and showed an iron level of 31, U IBC 177, TIBC of 208, saturation ratios of 15%, ferritin level 279, folate level 15.3, and vitamin B12 level of 2845 -Continue to Monitor for S/Sx of Bleeding; Currently no overt Bleeding noted -Repeat CBC in AM   Hypokalemia, improved -Patient potassium this morning  was 3.8 -Continue to monitor and replete as necessary -Repeat CMP in AM  Renal Insufficiency -Patient's renal function slightly worsened and BUN/creatinine is now 9/1.01 and a few days ago it was less than 5 and 0.83 -We will resume IV fluid hydration with normal saline rate of 75 mL's per hour for 12 hours Plan continue to avoid nephrotoxic medications, contrast dyes as well as hypotension if able -Question if the increase is in creatinine is related to the contrast that she got for her recent CT of the abdomen and pelvis -Continue to monitor and trend renal function -Repeat CMP in a.m.  Vaginal Discomfort and Burning -Likely in the setting of a yeast infection -Urinalysis was ordered and  showed a hazy appearance but was negative for bilirubin, negative for glucose negative for hemoglobin, negative for ketones negative for leukocytes and pH was 6.0 -Urine culture was sent but likely this is secondary to a yeast infection as she has been taking IV antibiotics -Given one-time dose of p.o. fluconazole 50 mg with improvement -UDS was checked and was positive for THC -Continue to monitor and follow-up on urine culture that was sent  DVT prophylaxis: SCDs Code Status: FULL CODE Family Communication: No family present at bedside  Disposition Plan: Pending improvement and clearance by General Surgery  Consultants:   General Surgery  Interventional Radiology    Procedures: None  Antimicrobials:  Anti-infectives (From admission, onward)   Start     Dose/Rate Route Frequency Ordered Stop   10/26/18 1930  fluconazole (DIFLUCAN) tablet 150 mg     150 mg Oral  Once 10/26/18 1846 10/26/18 2021   10/20/18 2115  piperacillin-tazobactam (ZOSYN) IVPB 3.375 g     3.375 g 12.5 mL/hr over 240 Minutes Intravenous Every 8 hours 10/20/18 2107     10/20/18 2100  piperacillin-tazobactam (ZOSYN) IVPB 3.375 g  Status:  Discontinued     3.375 g 100 mL/hr over 30 Minutes Intravenous  Once 10/20/18 2052 10/21/18 0105     Subjective: Seen and examined at bedside and she had no complaints at this time.  States that her burning discomfort in her vaginal area improved now after her fluconazole.  Still awaiting surgical input.  No chest pain, lightheadedness or dizziness and tolerating her diet without any issues or pain.  Objective: Vitals:   10/26/18 1424 10/26/18 2130 10/27/18 0500 10/27/18 0547  BP: 133/82 126/69  131/76  Pulse: 64 68  (!) 59  Resp: 18 18  18   Temp: 99 F (37.2 C) 98.4 F (36.9 C)  98.4 F (36.9 C)  TempSrc: Oral Oral  Oral  SpO2: 99% 95%  100%  Weight:   67.9 kg   Height:        Intake/Output Summary (Last 24 hours) at 10/27/2018 1221 Last data filed at 10/27/2018  1000 Gross per 24 hour  Intake 1200 ml  Output 700 ml  Net 500 ml   Filed Weights   10/25/18 0500 10/26/18 0504 10/27/18 0500  Weight: 70.4 kg 67.6 kg 67.9 kg   Examination: Physical Exam:  Constitutional: WN/WD overweight AAF in NAD and appears calm and comfortable Eyes: Lids and conjunctivae normal, sclerae anicteric  ENMT: External Ears, Nose appear normal. Grossly normal hearing. MMM Neck: Appears normal, supple, no cervical masses, normal ROM, no appreciable thyromegaly; no JVD Respiratory: Diminished to auscultation bilaterally, no wheezing, rales, rhonchi or crackles. Normal respiratory effort and patient is not tachypenic. No accessory muscle use.  Cardiovascular: RRR, no murmurs / rubs / gallops.  S1 and S2 auscultated. Abdomen: Soft, non-tender, Slightly distended due to body habitus. No masses palpated. No appreciable hepatosplenomegaly. Bowel sounds positive x4.  GU: Deferred. Musculoskeletal: No clubbing / cyanosis of digits/nails. No joint deformity upper and lower extremities.  Skin: No rashes, lesions, ulcers on a limited skin evaluation. No induration; Warm and dry.  Neurologic: CN 2-12 grossly intact with no focal deficits. Romberg sign and cerebellar reflexes not assessed.  Psychiatric: Normal judgment and insight. Alert and oriented x 3. Normal mood and appropriate affect.   Data Reviewed: I have personally reviewed following labs and imaging studies  CBC: Recent Labs  Lab 10/21/18 0300  10/23/18 0335 10/24/18 0815 10/25/18 0330 10/26/18 0419 10/27/18 0449  WBC 19.9*   < > 15.9* 14.5* 11.9* 11.2* 10.6*  NEUTROABS 14.0*  --  12.1*  --  8.5* 7.4 6.7  HGB 11.4*   < > 10.9* 11.2* 11.2* 11.3* 11.6*  HCT 34.1*   < > 33.0* 33.4* 33.8* 34.7* 36.2  MCV 94.2   < > 95.7 94.1 94.2 94.0 95.5  PLT 350   < > 336 360 367 379 395   < > = values in this interval not displayed.   Basic Metabolic Panel: Recent Labs  Lab 10/23/18 0335 10/24/18 0815 10/25/18 0330  10/26/18 0419 10/27/18 0449  NA 139 136 139 139 138  K 4.1 3.1* 3.8 4.0 3.8  CL 111 100 104 104 105  CO2 17* 25 26 23 22   GLUCOSE 125* 136* 106* 91 95  BUN 8 <5* <5* 5* 9  CREATININE 0.84 0.83 0.89 0.94 1.01*  CALCIUM 8.3* 8.3* 8.8* 9.1 9.0  MG 2.0 2.0 2.1 2.1 2.3  PHOS 2.9 3.2 4.0 4.8* 4.5   GFR: Estimated Creatinine Clearance: 56.1 mL/min (A) (by C-G formula based on SCr of 1.01 mg/dL (H)). Liver Function Tests: Recent Labs  Lab 10/23/18 0335 10/24/18 0815 10/25/18 0330 10/26/18 0419 10/27/18 0449  AST 32 35 29 42* 62*  ALT 53* 47* 44 51* 68*  ALKPHOS 142* 127* 113 120 109  BILITOT 1.5* 1.0 0.9 0.6 0.7  PROT 7.3 7.3 7.1 7.6 7.5  ALBUMIN 3.0* 3.1* 3.1* 3.2* 3.3*   No results for input(s): LIPASE, AMYLASE in the last 168 hours. No results for input(s): AMMONIA in the last 168 hours. Coagulation Profile: Recent Labs  Lab 10/20/18 2138  INR 1.1   Cardiac Enzymes: No results for input(s): CKTOTAL, CKMB, CKMBINDEX, TROPONINI in the last 168 hours. BNP (last 3 results) No results for input(s): PROBNP in the last 8760 hours. HbA1C: No results for input(s): HGBA1C in the last 72 hours. CBG: Recent Labs  Lab 10/23/18 0809 10/24/18 0745 10/25/18 0755 10/26/18 0732 10/27/18 0748  GLUCAP 105* 137* 109* 88 95   Lipid Profile: No results for input(s): CHOL, HDL, LDLCALC, TRIG, CHOLHDL, LDLDIRECT in the last 72 hours. Thyroid Function Tests: No results for input(s): TSH, T4TOTAL, FREET4, T3FREE, THYROIDAB in the last 72 hours. Anemia Panel: No results for input(s): VITAMINB12, FOLATE, FERRITIN, TIBC, IRON, RETICCTPCT in the last 72 hours. Sepsis Labs: No results for input(s): PROCALCITON, LATICACIDVEN in the last 168 hours.  Recent Results (from the past 240 hour(s))  SARS Coronavirus 2 [facility] order, Performed in [facility] hospital lab) Nasopharyngeal Nasopharyngeal Swab     Status: None   Collection Time: 10/20/18  9:51 PM   Specimen: Nasopharyngeal Swab    Result Value Ref Range Status   SARS Coronavirus 2 NEGATIVE NEGATIVE Final    Comment: (NOTE)  If result is NEGATIVE SARS-CoV-2 target nucleic acids are NOT DETECTED. The SARS-CoV-2 RNA is generally detectable in upper and lower  respiratory specimens during the acute phase of infection. The lowest  concentration of SARS-CoV-2 viral copies this assay can detect is 250  copies / mL. A negative result does not preclude SARS-CoV-2 infection  and should not be used as the sole basis for treatment or other  patient management decisions.  A negative result may occur with  improper specimen collection / handling, submission of specimen other  than nasopharyngeal swab, presence of viral mutation(s) within the  areas targeted by this assay, and inadequate number of viral copies  (<250 copies / mL). A negative result must be combined with clinical  observations, patient history, and epidemiological information. If result is POSITIVE SARS-CoV-2 target nucleic acids are DETECTED. The SARS-CoV-2 RNA is generally detectable in upper and lower  respiratory specimens dur ing the acute phase of infection.  Positive  results are indicative of active infection with SARS-CoV-2.  Clinical  correlation with patient history and other diagnostic information is  necessary to determine patient infection status.  Positive results do  not rule out bacterial infection or co-infection with other viruses. If result is PRESUMPTIVE POSTIVE SARS-CoV-2 nucleic acids MAY BE PRESENT.   A presumptive positive result was obtained on the submitted specimen  and confirmed on repeat testing.  While 2019 novel coronavirus  (SARS-CoV-2) nucleic acids may be present in the submitted sample  additional confirmatory testing may be necessary for epidemiological  and / or clinical management purposes  to differentiate between  SARS-CoV-2 and other Sarbecovirus currently known to infect humans.  If clinically indicated additional  testing with an alternate test  methodology [phone]) is advised. The SARS-CoV-2 RNA is generally  detectable in upper and lower respiratory sp ecimens during the acute  phase of infection. The expected result is Negative. Fact Sheet for Patients:  StrictlyIdeas.no Fact Sheet for Healthcare Providers: BankingDealers.co.za This test is not yet approved or cleared by the [place] FDA and has been authorized for detection and/or diagnosis of SARS-CoV-2 by FDA under an Emergency Use Authorization (EUA).  This EUA will remain in effect (meaning this test can be used) for the duration of the COVID-19 declaration under Section 564(b)(1) of the Act, 21 U.S.C. section 360bbb-3(b)(1), unless the authorization is terminated or revoked sooner. Performed at [facility], [address] [place], [place] [address]     Radiology Studies: No results found. Scheduled Meds:  amLODipine  10 mg Oral Daily   And   irbesartan  300 mg Oral Daily   carvedilol  6.25 mg Oral BID WC   enoxaparin (LOVENOX) injection  40 mg Subcutaneous Q24H   Continuous Infusions:  sodium chloride 250 mL (10/20/18 2104)   sodium chloride 75 mL/hr at 10/27/18 1031   methocarbamol (ROBAXIN) IV     ondansetron (ZOFRAN) IV     piperacillin-tazobactam (ZOSYN)  IV 3.375 g (10/27/18 0511)    LOS: 7 days   Kerney Elbe, DO [facility] PAGER is on [place]  If 7PM-7AM, please contact night-coverage [email or web address] Password [facility] 10/27/2018, 12:21 PM

## 2018-10-27 NOTE — Progress Notes (Signed)
[facility]/Trauma Progress Note      Assessment/Plan HTN Type 2 diabetes Depression Anxiety  Acute sigmoid diverticulitis with perforation and pericolonic abscess 7X2.7X 3.6cm -IR unable to access abscess -Continue IV antibiotics - Patient is tolerating regular diet without increase in pain - may need lap abscess drainage or Hartman's, will discuss with MD  [id number] diet VTE: SCD's,Lovenox [id number], WBC 10.6, afebrile Foley:None Follow up:TBD   LOS: 7 days    Subjective: CC: Abdominal pain  Patient states she is not having much abdominal pain.  She had a regular diet yesterday without increased abdominal pain.  She is passing gas and having bowel movements also without increased abdominal pain.  She denies nausea, vomiting, fever or chills.  She denies urinary issues.  She states she is urinating less but she thinks it is because she is not drinking as much.  Objective: Vital signs in last 24 hours: Temp:  [98.4 F (36.9 C)-99 F (37.2 C)] 98.4 F (36.9 C) (09/14 0547) Pulse Rate:  [59-68] 59 (09/14 0547) Resp:  [18] 18 (09/14 0547) BP: (126-133)/(69-82) 131/76 (09/14 0547) SpO2:  [95 %-100 %] 100 % (09/14 0547) Weight:  [67.9 kg] 67.9 kg (09/14 0500) Last BM Date: 10/26/18  Intake/Output from previous day: 09/13 0701 - 09/14 0700 In: 1130 [P.O.:840; I.V.:40; IV Piggyback:250] Out: 700 [Urine:700] Intake/Output this shift: No intake/output data recorded.  PE: Gen: Alert, NAD, pleasant, cooperative, well-appearing Pulm:Rate andeffort normal Abd: Soft,very mild TTP of suprapubic region and LLQ withoutguarding, no peritonitis Skin: no rashes noted, warm and dry   Anti-infectives: Anti-infectives (From admission, onward)   Start     Dose/Rate Route Frequency Ordered Stop   10/26/18 1930  fluconazole (DIFLUCAN) tablet 150 mg     150 mg Oral  Once 10/26/18 1846 10/26/18 2021   10/20/18 2115  piperacillin-tazobactam  (ZOSYN) IVPB 3.375 g     3.375 g 12.5 mL/hr over 240 Minutes Intravenous Every 8 hours 10/20/18 2107     10/20/18 2100  piperacillin-tazobactam (ZOSYN) IVPB 3.375 g  Status:  Discontinued     3.375 g 100 mL/hr over 30 Minutes Intravenous  Once 10/20/18 2052 10/21/18 0105      Lab Results:  Recent Labs    10/26/18 0419 10/27/18 0449  WBC 11.2* 10.6*  HGB 11.3* 11.6*  HCT 34.7* 36.2  PLT 379 395   BMET Recent Labs    10/26/18 0419 10/27/18 0449  NA 139 138  K 4.0 3.8  CL 104 105  CO2 23 22  GLUCOSE 91 95  BUN 5* 9  CREATININE 0.94 1.01*  CALCIUM 9.1 9.0   PT/INR No results for input(s): LABPROT, INR in the last 72 hours. CMP     Component Value Date/Time   NA 138 10/27/2018 0449   K 3.8 10/27/2018 0449   CL 105 10/27/2018 0449   CO2 22 10/27/2018 0449   GLUCOSE 95 10/27/2018 0449   BUN 9 10/27/2018 0449   CREATININE 1.01 (H) 10/27/2018 0449   CREATININE 1.03 04/19/2017 1617   CALCIUM 9.0 10/27/2018 0449   PROT 7.5 10/27/2018 0449   ALBUMIN 3.3 (L) 10/27/2018 0449   AST 62 (H) 10/27/2018 0449   ALT 68 (H) 10/27/2018 0449   ALKPHOS 109 10/27/2018 0449   BILITOT 0.7 10/27/2018 0449   GFRNONAA >60 10/27/2018 0449   GFRNONAA 61 08/25/2013 1129   GFRAA >60 10/27/2018 0449   GFRAA 71 08/25/2013 1129   Lipase  No results found for: LIPASE  Studies/Results: Ct  Pelvis Wo Contrast  Result Date: 10/25/2018 CLINICAL DATA:  Presumed diverticular abscess, left lower quadrant pain. Recent CT demonstrated some increase in size of pelvic interloop collections. Drainage requested EXAM: CT PELVIS WITHOUT CONTRAST TECHNIQUE: Multidetector CT imaging of the pelvis was performed following the standard protocol without intravenous contrast. Scanning was obtained both prone and supine RAO. COMPARISON:  10/24/2018 and previous FINDINGS: Urinary Tract:  Urinary bladder partially distended, unremarkable. Bowel: Visualized loops of some small large bowel are nondilated.  Low-attenuation pelvic interloop collections are grossly stable in size, the margin somewhat less conspicuous in the absence of IV contrast. The interloop collections are relatively fixed in position despite changes in patient positioning such that no safe percutaneous window for percutaneous drain placement could be achieved. Vascular/Lymphatic: Scattered aortoiliac atheromatous calcifications without aneurysm. No pelvic adenopathy localized. Reproductive:  No mass or other significant abnormality Other:  Pelvic phleboliths. No free air. Musculoskeletal: No suspicious bone lesions identified. IMPRESSION: 1. No significant change in size or appearance of pelvic interloop collections. Despite variations in patient positioning, no safe percutaneous window for drain placement could be achieved. 2. Aortoiliac  Atherosclerosis (ICD10-I70.0). Electronically Signed   By: Lucrezia Europe M.D.   On: 10/25/2018 10:43      Kalman Drape , [facility] 10/27/2018, 9:11 AM  Pager: [phone] Mon-Wed, Friday 7:00am-4:30pm Thurs 7am-11:30am  Consults: [phone]

## 2018-10-28 LAB — CBC WITH DIFFERENTIAL/PLATELET
Abs Immature Granulocytes: 0.08 10*3/uL — ABNORMAL HIGH (ref 0.00–0.07)
Basophils Absolute: 0.1 10*3/uL (ref 0.0–0.1)
Basophils Relative: 1 %
Eosinophils Absolute: 0.1 10*3/uL (ref 0.0–0.5)
Eosinophils Relative: 1 %
HCT: 34.3 % — ABNORMAL LOW (ref 36.0–46.0)
Hemoglobin: 10.9 g/dL — ABNORMAL LOW (ref 12.0–15.0)
Immature Granulocytes: 1 %
Lymphocytes Relative: 30 %
Lymphs Abs: 2.8 10*3/uL (ref 0.7–4.0)
MCH: 30.5 pg (ref 26.0–34.0)
MCHC: 31.8 g/dL (ref 30.0–36.0)
MCV: 96.1 fL (ref 80.0–100.0)
Monocytes Absolute: 1 10*3/uL (ref 0.1–1.0)
Monocytes Relative: 11 %
Neutro Abs: 5.4 10*3/uL (ref 1.7–7.7)
Neutrophils Relative %: 56 %
Platelets: 387 10*3/uL (ref 150–400)
RBC: 3.57 MIL/uL — ABNORMAL LOW (ref 3.87–5.11)
RDW: 13.3 % (ref 11.5–15.5)
WBC: 9.5 10*3/uL (ref 4.0–10.5)
nRBC: 0 % (ref 0.0–0.2)

## 2018-10-28 LAB — URINE CULTURE: Culture: NO GROWTH

## 2018-10-28 LAB — COMPREHENSIVE METABOLIC PANEL
ALT: 72 U/L — ABNORMAL HIGH (ref 0–44)
AST: 60 U/L — ABNORMAL HIGH (ref 15–41)
Albumin: 3.1 g/dL — ABNORMAL LOW (ref 3.5–5.0)
Alkaline Phosphatase: 105 U/L (ref 38–126)
Anion gap: 11 (ref 5–15)
BUN: 8 mg/dL (ref 6–20)
CO2: 20 mmol/L — ABNORMAL LOW (ref 22–32)
Calcium: 8.6 mg/dL — ABNORMAL LOW (ref 8.9–10.3)
Chloride: 106 mmol/L (ref 98–111)
Creatinine, Ser: 0.9 mg/dL (ref 0.44–1.00)
GFR calc Af Amer: >60 mL/min (ref >60)
GFR calc non Af Amer: >60 mL/min (ref >60)
Glucose, Bld: 104 mg/dL — ABNORMAL HIGH (ref 70–99)
Potassium: 3.5 mmol/L (ref 3.5–5.1)
Sodium: 137 mmol/L (ref 135–145)
Total Bilirubin: 0.6 mg/dL (ref 0.3–1.2)
Total Protein: 7 g/dL (ref 6.5–8.1)

## 2018-10-28 LAB — PHOSPHORUS: Phosphorus: 3.9 mg/dL (ref 2.5–4.6)

## 2018-10-28 LAB — MAGNESIUM: Magnesium: 2.2 mg/dL (ref 1.7–2.4)

## 2018-10-28 LAB — GLUCOSE, CAPILLARY: Glucose-Capillary: 87 mg/dL (ref 70–99)

## 2018-10-28 NOTE — Progress Notes (Signed)
[facility]/Trauma Progress Note      Assessment/Plan HTN Type 2 diabetes Depression Anxiety  Acute sigmoid diverticulitis with perforation and pericolonic abscess 7X2.7X 3.6cm -IRunable to access abscess -Continue IV antibiotics - Patient is tolerating regular diet without increase in pain - Repeat CT scan on Thursday to reevaluate - Patient is clinically improving but if she deteriorates she will need ex lap.  [id number] diet VTE: SCD's,Lovenox [id number], WBC 9.5, afebrile Foley:None Follow up:TBD   LOS: 8 days    Subjective: CC: Abdominal pain  Patient states no increased abdominal pain.  She continues to have bowel function.  She denies nausea, vomiting, fever or chills.  She is tolerating a regular diet.  Objective: Vital signs in last 24 hours: Temp:  [98 F (36.7 C)-98.4 F (36.9 C)] 98.2 F (36.8 C) (09/15 0500) Pulse Rate:  [54-62] 55 (09/15 0500) Resp:  [16-20] 20 (09/15 0500) BP: (121-129)/(72-76) 125/73 (09/15 0500) SpO2:  [98 %-99 %] 98 % (09/15 0500) Weight:  NN:[id number] kg] 69 kg (09/15 0500) Last BM Date: 10/27/18  Intake/Output from previous day: 09/14 0701 - 09/15 0700 In: 1180 [P.O.:340; I.V.:750; IV Piggyback:90] Out: 701 [Urine:701] Intake/Output this shift: Total I/O In: -  Out: 400 [Urine:400]  PE: Gen: Alert, NAD, pleasant, cooperative, well-appearing Pulm:Rate andeffort normal Abd: Soft,very mild TTP of suprapubic region and LLQ withoutguarding, no peritonitis Skin: no rashes noted, warm and dry   Anti-infectives: Anti-infectives (From admission, onward)   Start     Dose/Rate Route Frequency Ordered Stop   10/26/18 1930  fluconazole (DIFLUCAN) tablet 150 mg     150 mg Oral  Once 10/26/18 1846 10/26/18 2021   10/20/18 2115  piperacillin-tazobactam (ZOSYN) IVPB 3.375 g     3.375 g 12.5 mL/hr over 240 Minutes Intravenous Every 8 hours 10/20/18 2107     10/20/18 2100  piperacillin-tazobactam (ZOSYN)  IVPB 3.375 g  Status:  Discontinued     3.375 g 100 mL/hr over 30 Minutes Intravenous  Once 10/20/18 2052 10/21/18 0105      Lab Results:  Recent Labs    10/27/18 0449 10/28/18 0332  WBC 10.6* 9.5  HGB 11.6* 10.9*  HCT 36.2 34.3*  PLT 395 387   BMET Recent Labs    10/27/18 0449 10/28/18 0332  NA 138 137  K 3.8 3.5  CL 105 106  CO2 22 20*  GLUCOSE 95 104*  BUN 9 8  CREATININE 1.01* 0.90  CALCIUM 9.0 8.6*   PT/INR No results for input(s): LABPROT, INR in the last 72 hours. CMP     Component Value Date/Time   NA 137 10/28/2018 0332   K 3.5 10/28/2018 0332   CL 106 10/28/2018 0332   CO2 20 (L) 10/28/2018 0332   GLUCOSE 104 (H) 10/28/2018 0332   BUN 8 10/28/2018 0332   CREATININE 0.90 10/28/2018 0332   CREATININE 1.03 04/19/2017 1617   CALCIUM 8.6 (L) 10/28/2018 0332   PROT 7.0 10/28/2018 0332   ALBUMIN 3.1 (L) 10/28/2018 0332   AST 60 (H) 10/28/2018 0332   ALT 72 (H) 10/28/2018 0332   ALKPHOS 105 10/28/2018 0332   BILITOT 0.6 10/28/2018 0332   GFRNONAA >60 10/28/2018 0332   GFRNONAA 61 08/25/2013 1129   GFRAA >60 10/28/2018 0332   GFRAA 71 08/25/2013 1129   Lipase  No results found for: LIPASE  Studies/Results: No results found.    Kalman Drape , [facility] 10/28/2018, 8:39 AM  Pager: [phone] Mon-Wed, Friday 7:00am-4:30pm Thurs  7am-11:30am  Consults: [phone]

## 2018-10-28 NOTE — Progress Notes (Signed)
PROGRESS NOTE    [name]  [id number] DOB: 08/26/1960 DOA: 10/20/2018 PCP: Debbrah Alar, NP   Brief Narrative:  HPI per Dr. Mitzi Hansen on 10/20/2018 Kaitlyn Harper is a 58 y.o. female with medical history significant for hypertension, diet-controlled diabetes mellitus, hyperlipidemia, depression, and anxiety, now presenting to the emergency department for evaluation of abdominal pain.  Patient developed lower abdominal discomfort, fevers, chills, and nausea without vomiting approximately 1 week ago, was suspected to have UTI, was given a dose of Rocephin and started on Levaquin on 10/16/2018, continued to feel poorly with ongoing lower abdominal pain, was noted to have leukocytosis greater than 20,000 on outpatient blood work, and was directed to the ED for further evaluation of this.  She has had some loose stools without any melena or hematochezia, has nausea and loss of appetite, but no vomiting.  She had never experienced these symptoms previously.  Denies cough or shortness of breath.  [facility] ED Course: Upon arrival to the ED, patient is found to be afebrile, saturating well on room air, slightly tachycardic, and with stable blood pressure.  Chemistry panel is notable for potassium of 3.0 and mild elevation in transaminases.  CBC features a leukocytosis to 20,800.  CT of the abdomen and pelvis is concerning for acute sigmoid diverticulitis with perforation and multilocular pericolonic abscess.  COVID-19 is negative. Patient was given a liter of lactated Ringer's, Zofran, Dilaudid, and Zosyn in the emergency department.  Surgery was consulted by the ED physician and recommended medical admission.  **Interim History  Surgery evaluated and recommended IR to evaluate for possible drain placement however there is no safe window for drain to be placed and they recommended a follow-up CT which is was done. Will Continue with IV antibiotics and bowel rest but Surgery  advanced diet to CLD and now Soft Diet and recommending to continue ambulating.  General surgery still hoped the patient can have abscess drained after repeat CT scan but unfortunately there is no safe window to place drain per Dr. Vernard Gambles. Patient is having flatus but currently having a bowel movement.  Continues to have some pain but is improved.  We will evaluate how the patient does on a Soft diet and general surgery thinks that she still may need a laparoscopic abscess drainage for Hartman's procedure.  Currently surgery has decided to repeat a CT scan in 2 days to evaluate whether or not the abscess is decreasing in size and they are open to make a decision about operative intervention at that point.  If she clinically deteriorates the next 48 hours then she may require operative intervention on a more urgent basis but for now they are recommending continue IV antibiotics and continue diet.   Assessment & Plan:   Principal Problem:   Diverticulitis of large intestine with perforation and abscess Active Problems:   Essential hypertension   Hyperlipidemia   Tobacco abuse   Hypokalemia   Impaired glucose tolerance   Depressive disorder   Anemia   Anxiety   AV block, 1st degree   Acute Diverticulitis with perforation and abscess -Presents with persistent lower abdominal pain, fevers, and nausea despite treatment for suspected UTI, and is found to have acute sigmoid diverticulitis with perforation and abscess -She has been hemodynamically stable -Surgery is consulting and appreciate further evaluation and recommendations -IR has been consulted and today said that they are unable to safely drain the abscess due to it's positioning initially and even with Repeat CT  Scan  -Temps improving 101 and TMax wa 99.5, WBC slightly down 22.6 -> 9.5 -C/w IV Zosyn for Abx Coverage and with Pain-control with IV Hydromorphone 0.5-1 mg IV q3hprn Moderate and Severe Pain and also continue with IV  methocarbamol 1000 mg every 6 hours PRN for muscle spasm -IVF hydration has now been discontinued -Antiemeticswith IV Zofran  -Further care per general surgery and appreciate further recommendations;  -General Surgery repeated CT scan of the Abdomen/Pelvis today Friday, 10/24/2018 and showed "No significant change in size or appearance of pelvic interloop collections. Despite variations in patient positioning, no safe percutaneous window for drain placement could be achieved. Aortoiliac  Atherosclerosis." -Diet advanced per general surgery and she is now on a Soft Diet -General surgery feeling she still may require laparoscopic drainage of her abscess or a Hartman's procedure but will want to see how she tolerates a soft diet first.  Currently she is tolerating her soft diet without increase in pain and will defer to surgery on what they want to do -For now they are recommending continue IV antibiotics and continue monitoring and they are planning on repeating a CT scan in 2 days to see if abscess is decreasing in size and they will decide about operative intervention at that point but if she clinically deteriorates sooner than they may need operative intervention on a more urgent basis  Hypertension -BP at goal in 120/80 range on admission but now currently 125/73 -Resumed Amlodipine 10 mg po Daily and Irbesartan 300 mg po Daily; C/w Carvedilol 6.25 mg po BID -Use labetalol IVP's as needed while NPO; currently on IV labetalol 10 mg every 2 as needed for systolic blood pressure around [id number] or diastolic pressure than [id number]  Type II DM -Diet-controlled at home, serum glucose is 108 in EDand repeat this morning was 104 - BS's are stable and CBGs have been ranging from 84-110 and Daily blood sugars on BMP/CMP  have been ranging from 90-136  -Monitoring CBG's qam, holding off on SSI for now and will continue to monitor carefully  Abnormal LFTs, was improving and now slightly worsened  -Unclear  Etiology but suspect in the setting of Diverticulitis and trending down -Patient's AST went from 67 -> 47 -> 45 -> 35 -> 29 -> 42 -> 62 -> 60 -Patient's ALT went from 92 -> 74 -> 67 -> 47 -> 44 -> 51 -> 68 -> 72 -Continue to Monitor and Trend and if worsening more or not improving will obtain a RUQ U/S and a Acute Hepatitis Panel in AM  -Continue to Hold Statin  -Repeat CMP in AM   Hyperbilirubinemia, improving -Likely reactive -Patient's T bili is improved to 0.6 -Continue to monitor and Trend -Repeat CMP in a.m.  Overweight -Estimated body mass index is 26.11 kg/m as calculated from the following:   Height as of this encounter: 5\' 4"  (1.626 m).   Weight as of this encounter: 69 kg. -Weight Loss and Dietary Counseling given   Metabolic Acidosis,  -Pateint's CO2 was 21, AG was 10 and Chlroide was 108; Now CO2 is 20, AG is 11, and Chloride is 106 -IVF was resumed given mild Renal Insuffiencey but now stopped   -Continue to Monitor and Trend -Repeat CMP in AM   Normocytic Anemia, slightly decreasing  -Patient's Hb/Hct went from 11.6/36.2 -> 10.9/34.3 -Checked Anemia Panel and showed an iron level of 31, U IBC 177, TIBC of 208, saturation ratios of 15%, ferritin level 279, folate level 15.3, and vitamin B12  level of 2845 -Continue to Monitor for S/Sx of Bleeding; Currently no overt Bleeding noted -Repeat CBC in AM   Hypokalemia, improved -Patient potassium this morning was 3.5 -Will Replete with po KCl 40 mEQ BID x2 doses  -Continue to monitor and replete as necessary -Repeat CMP in AM  Renal Insufficiency -Patient's renal function slightly worsened and BUN/creatinine was 9/1.01 and a few days ago it was <5/0.83; Now BUN/Cr is 8/0.90 -Resumed IV fluid hydration with normal saline rate of 75 mL's per hour for 12 hours but now stopped  Plan continue to avoid nephrotoxic medications, contrast dyes as well as hypotension if able -Question if the increase is in creatinine is related  to the contrast that she got for her recent CT of the abdomen and pelvis -Continue to monitor and trend renal function -Repeat CMP in a.m.  Vaginal Discomfort and Burningm improved  -Likely in the setting of a yeast infection -Urinalysis was ordered and showed a hazy appearance but was negative for bilirubin, negative for glucose negative for hemoglobin, negative for ketones negative for leukocytes and pH was 6.0 -Urine culture was sent but likely this is secondary to a yeast infection as she has been taking IV antibiotics; Urine Cx is pending  -Given one-time dose of p.o. fluconazole 50 mg with improvement -UDS was checked and was positive for THC -Continue to monitor and follow-up on urine culture that was sent  DVT prophylaxis: SCDs Code Status: FULL CODE Family Communication: No family present at bedside  Disposition Plan: Pending improvement and clearance by General Surgery  Consultants:   General Surgery  Interventional Radiology    Procedures: None  Antimicrobials:  Anti-infectives (From admission, onward)   Start     Dose/Rate Route Frequency Ordered Stop   10/26/18 1930  fluconazole (DIFLUCAN) tablet 150 mg     150 mg Oral  Once 10/26/18 1846 10/26/18 2021   10/20/18 2115  piperacillin-tazobactam (ZOSYN) IVPB 3.375 g     3.375 g 12.5 mL/hr over 240 Minutes Intravenous Every 8 hours 10/20/18 2107     10/20/18 2100  piperacillin-tazobactam (ZOSYN) IVPB 3.375 g  Status:  Discontinued     3.375 g 100 mL/hr over 30 Minutes Intravenous  Once 10/20/18 2052 10/21/18 0105     Subjective: Seen and examined at bedside and she was doing well with no issues.  Denies any chest pain, lightheadedness or dizziness.  States that she will not take a shower.  No other concerns or complaints at this time and has not had any nausea vomiting or abdominal pain currently.  Objective: Vitals:   10/27/18 0547 10/27/18 1341 10/27/18 2205 10/28/18 0500  BP: 131/76 129/76 121/72 125/73    Pulse: (!) 59 62 (!) 54 (!) 55  Resp: 18 16 16 20   Temp: 98.4 F (36.9 C) 98 F (36.7 C) 98.4 F (36.9 C) 98.2 F (36.8 C)  TempSrc: Oral Oral Oral Oral  SpO2: 100% 99% 98% 98%  Weight:    69 kg  Height:        Intake/Output Summary (Last 24 hours) at 10/28/2018 1119 Last data filed at 10/28/2018 0935 Gross per 24 hour  Intake 1300.02 ml  Output 1401 ml  Net -100.98 ml   Filed Weights   10/26/18 0504 10/27/18 0500 10/28/18 0500  Weight: 67.6 kg 67.9 kg 69 kg   Examination: Physical Exam:  Constitutional: WN/WD overweight AAF in NAD and appears calm and comfortable Eyes: Lids and conjunctivae normal, sclerae anicteric  ENMT: External Ears,  Nose appear normal. Grossly normal hearing. Mucous membranes are moist. Neck: Appears normal, supple, no cervical masses, normal ROM, no appreciable thyromegaly; no JVD Respiratory: Diminished to auscultation bilaterally, no wheezing, rales, rhonchi or crackles. Normal respiratory effort and patient is not tachypenic. No accessory muscle use.   Cardiovascular: RRR, no murmurs / rubs / gallops. S1 and S2 auscultated. No extremity edema.  Abdomen: Soft, non-tender, Distended slightly 2/2 to Body habitus. No masses palpated. No appreciable hepatosplenomegaly. Bowel sounds positive x4.  GU: Deferred. Musculoskeletal: No clubbing / cyanosis of digits/nails. No joint deformity upper and lower extremities.  Skin: No rashes, lesions, ulcers on a limited skin . No induration; Warm and dry.  Neurologic: CN 2-12 grossly intact with no focal deficits. Romberg sign and cerebellar reflexes not assessed.  Psychiatric: Normal judgment and insight. Alert and oriented x 3. Normal mood and appropriate affect.    Data Reviewed: I have personally reviewed following labs and imaging studies  CBC: Recent Labs  Lab 10/23/18 0335 10/24/18 0815 10/25/18 0330 10/26/18 0419 10/27/18 0449 10/28/18 0332  WBC 15.9* 14.5* 11.9* 11.2* 10.6* 9.5  NEUTROABS 12.1*   --  8.5* 7.4 6.7 5.4  HGB 10.9* 11.2* 11.2* 11.3* 11.6* 10.9*  HCT 33.0* 33.4* 33.8* 34.7* 36.2 34.3*  MCV 95.7 94.1 94.2 94.0 95.5 96.1  PLT 336 360 367 379 395 [id number]   Basic Metabolic Panel: Recent Labs  Lab 10/24/18 0815 10/25/18 0330 10/26/18 0419 10/27/18 0449 10/28/18 0332  NA 136 139 139 138 137  K 3.1* 3.8 4.0 3.8 3.5  CL 100 104 104 105 106  CO2 25 26 23 22  20*  GLUCOSE 136* 106* 91 95 104*  BUN <5* <5* 5* 9 8  CREATININE 0.83 0.89 0.94 1.01* 0.90  CALCIUM 8.3* 8.8* 9.1 9.0 8.6*  MG 2.0 2.1 2.1 2.3 2.2  PHOS 3.2 4.0 4.8* 4.5 3.9   GFR: Estimated Creatinine Clearance: 63.4 mL/min (by C-G formula based on SCr of 0.9 mg/dL). Liver Function Tests: Recent Labs  Lab 10/24/18 0815 10/25/18 0330 10/26/18 0419 10/27/18 0449 10/28/18 0332  AST 35 29 42* 62* 60*  ALT 47* 44 51* 68* 72*  ALKPHOS 127* 113 120 109 105  BILITOT 1.0 0.9 0.6 0.7 0.6  PROT 7.3 7.1 7.6 7.5 7.0  ALBUMIN 3.1* 3.1* 3.2* 3.3* 3.1*   No results for input(s): LIPASE, AMYLASE in the last 168 hours. No results for input(s): AMMONIA in the last 168 hours. Coagulation Profile: No results for input(s): INR, PROTIME in the last 168 hours. Cardiac Enzymes: No results for input(s): CKTOTAL, CKMB, CKMBINDEX, TROPONINI in the last 168 hours. BNP (last 3 results) No results for input(s): PROBNP in the last 8760 hours. HbA1C: No results for input(s): HGBA1C in the last 72 hours. CBG: Recent Labs  Lab 10/24/18 0745 10/25/18 0755 10/26/18 0732 10/27/18 0748 10/28/18 0727  GLUCAP 137* 109* 88 95 87   Lipid Profile: No results for input(s): CHOL, HDL, LDLCALC, TRIG, CHOLHDL, LDLDIRECT in the last 72 hours. Thyroid Function Tests: No results for input(s): TSH, T4TOTAL, FREET4, T3FREE, THYROIDAB in the last 72 hours. Anemia Panel: No results for input(s): VITAMINB12, FOLATE, FERRITIN, TIBC, IRON, RETICCTPCT in the last 72 hours. Sepsis Labs: No results for input(s): PROCALCITON, LATICACIDVEN in the  last 168 hours.  Recent Results (from the past 240 hour(s))  SARS Coronavirus 2 [facility] order, Performed in [facility] hospital lab) Nasopharyngeal Nasopharyngeal Swab     Status: None   Collection Time: 10/20/18  9:51 PM  Specimen: Nasopharyngeal Swab  Result Value Ref Range Status   SARS Coronavirus 2 NEGATIVE NEGATIVE Final    Comment: (NOTE) If result is NEGATIVE SARS-CoV-2 target nucleic acids are NOT DETECTED. The SARS-CoV-2 RNA is generally detectable in upper and lower  respiratory specimens during the acute phase of infection. The lowest  concentration of SARS-CoV-2 viral copies this assay can detect is 250  copies / mL. A negative result does not preclude SARS-CoV-2 infection  and should not be used as the sole basis for treatment or other  patient management decisions.  A negative result may occur with  improper specimen collection / handling, submission of specimen other  than nasopharyngeal swab, presence of viral mutation(s) within the  areas targeted by this assay, and inadequate number of viral copies  (<250 copies / mL). A negative result must be combined with clinical  observations, patient history, and epidemiological information. If result is POSITIVE SARS-CoV-2 target nucleic acids are DETECTED. The SARS-CoV-2 RNA is generally detectable in upper and lower  respiratory specimens dur ing the acute phase of infection.  Positive  results are indicative of active infection with SARS-CoV-2.  Clinical  correlation with patient history and other diagnostic information is  necessary to determine patient infection status.  Positive results do  not rule out bacterial infection or co-infection with other viruses. If result is PRESUMPTIVE POSTIVE SARS-CoV-2 nucleic acids MAY BE PRESENT.   A presumptive positive result was obtained on the submitted specimen  and confirmed on repeat testing.  While 2019 novel coronavirus  (SARS-CoV-2) nucleic acids may be present in the  submitted sample  additional confirmatory testing may be necessary for epidemiological  and / or clinical management purposes  to differentiate between  SARS-CoV-2 and other Sarbecovirus currently known to infect humans.  If clinically indicated additional testing with an alternate test  methodology [id number]) is advised. The SARS-CoV-2 RNA is generally  detectable in upper and lower respiratory sp ecimens during the acute  phase of infection. The expected result is Negative. Fact Sheet for Patients:  StrictlyIdeas.no Fact Sheet for Healthcare Providers: BankingDealers.co.za This test is not yet approved or cleared by the [place] FDA and has been authorized for detection and/or diagnosis of SARS-CoV-2 by FDA under an Emergency Use Authorization (EUA).  This EUA will remain in effect (meaning this test can be used) for the duration of the COVID-19 declaration under Section 564(b)(1) of the Act, 21 U.S.C. section 360bbb-3(b)(1), unless the authorization is terminated or revoked sooner. Performed at [facility], [address], [place], [place] [address]     Radiology Studies: No results found. Scheduled Meds:  amLODipine  10 mg Oral Daily   And   irbesartan  300 mg Oral Daily   carvedilol  6.25 mg Oral BID WC   enoxaparin (LOVENOX) injection  40 mg Subcutaneous Q24H   Continuous Infusions:  sodium chloride 250 mL (10/20/18 2104)   methocarbamol (ROBAXIN) IV     ondansetron (ZOFRAN) IV     piperacillin-tazobactam (ZOSYN)  IV 3.375 g (10/28/18 0544)    LOS: 8 days   Kerney Elbe, DO [facility] PAGER is on [place]  If 7PM-7AM, please contact night-coverage [email or web address] Password [email or web address] 10/28/2018, 11:19 AM

## 2018-10-29 LAB — COMPREHENSIVE METABOLIC PANEL
ALT: 94 U/L — ABNORMAL HIGH (ref 0–44)
AST: 77 U/L — ABNORMAL HIGH (ref 15–41)
Albumin: 3.2 g/dL — ABNORMAL LOW (ref 3.5–5.0)
Alkaline Phosphatase: 101 U/L (ref 38–126)
Anion gap: 8 (ref 5–15)
BUN: 9 mg/dL (ref 6–20)
CO2: 21 mmol/L — ABNORMAL LOW (ref 22–32)
Calcium: 8.8 mg/dL — ABNORMAL LOW (ref 8.9–10.3)
Chloride: 107 mmol/L (ref 98–111)
Creatinine, Ser: 1.08 mg/dL — ABNORMAL HIGH (ref 0.44–1.00)
GFR calc Af Amer: >60 mL/min (ref >60)
GFR calc non Af Amer: 56 mL/min — ABNORMAL LOW (ref >60)
Glucose, Bld: 104 mg/dL — ABNORMAL HIGH (ref 70–99)
Potassium: 3.7 mmol/L (ref 3.5–5.1)
Sodium: 136 mmol/L (ref 135–145)
Total Bilirubin: 0.6 mg/dL (ref 0.3–1.2)
Total Protein: 7.3 g/dL (ref 6.5–8.1)

## 2018-10-29 LAB — CBC WITH DIFFERENTIAL/PLATELET
Abs Immature Granulocytes: 0.06 10*3/uL (ref 0.00–0.07)
Basophils Absolute: 0.1 10*3/uL (ref 0.0–0.1)
Basophils Relative: 1 %
Eosinophils Absolute: 0.2 10*3/uL (ref 0.0–0.5)
Eosinophils Relative: 2 %
HCT: 35.4 % — ABNORMAL LOW (ref 36.0–46.0)
Hemoglobin: 11.3 g/dL — ABNORMAL LOW (ref 12.0–15.0)
Immature Granulocytes: 1 %
Lymphocytes Relative: 31 %
Lymphs Abs: 2.8 10*3/uL (ref 0.7–4.0)
MCH: 30.8 pg (ref 26.0–34.0)
MCHC: 31.9 g/dL (ref 30.0–36.0)
MCV: 96.5 fL (ref 80.0–100.0)
Monocytes Absolute: 0.9 10*3/uL (ref 0.1–1.0)
Monocytes Relative: 10 %
Neutro Abs: 4.9 10*3/uL (ref 1.7–7.7)
Neutrophils Relative %: 55 %
Platelets: 403 10*3/uL — ABNORMAL HIGH (ref 150–400)
RBC: 3.67 MIL/uL — ABNORMAL LOW (ref 3.87–5.11)
RDW: 13.2 % (ref 11.5–15.5)
WBC: 8.9 10*3/uL (ref 4.0–10.5)
nRBC: 0 % (ref 0.0–0.2)

## 2018-10-29 LAB — PHOSPHORUS: Phosphorus: 3.9 mg/dL (ref 2.5–4.6)

## 2018-10-29 LAB — GLUCOSE, CAPILLARY: Glucose-Capillary: 102 mg/dL — ABNORMAL HIGH (ref 70–99)

## 2018-10-29 LAB — MAGNESIUM: Magnesium: 2.2 mg/dL (ref 1.7–2.4)

## 2018-10-29 MED ORDER — IOHEXOL 300 MG/ML  SOLN: 30.0000 mL | Freq: Once | INTRAMUSCULAR | Status: DC | PRN

## 2018-10-29 NOTE — Progress Notes (Signed)
[facility]/Trauma Progress Note      Assessment/Plan HTN Type 2 diabetes Depression Anxiety  Acute sigmoid diverticulitis with perforation and pericolonic abscess 7X2.7X 3.6cm -IRunable to access abscess -Continue IV antibiotics -Patient is tolerating regular diet without pain - Repeat CT scan tomorrow - Patient is clinically improving but if she deteriorates she will need ex lap.  [id number] diet, n.p.o. at midnight VTE: SCD's,Lovenox [id number]  8.9, afebrile Foley:None Follow up:TBD   LOS: 9 days    Subjective: CC: No complaints  Patient denies abdominal pain.  She continues to have bowel function.  She denies nausea, vomiting, fever or chills.  Objective: Vital signs in last 24 hours: Temp:  [98.2 F (36.8 C)-98.7 F (37.1 C)] 98.2 F (36.8 C) (09/16 0623) Pulse Rate:  [58] 58 (09/16 0623) Resp:  [18] 18 (09/16 0623) BP: (111-123)/(71-75) 123/75 (09/16 0623) SpO2:  [97 %-100 %] 99 % (09/16 0623) Last BM Date: 10/27/18  Intake/Output from previous day: 09/15 0701 - 09/16 0700 In: 1650.5 [P.O.:1440; I.V.:46; IV Piggyback:164.5] Out: 2000 [Urine:2000] Intake/Output this shift: Total I/O In: 120 [P.O.:120] Out: -   PE: Gen: Alert, NAD, pleasant, cooperative, well-appearing Pulm:Rate andeffort normal Abd: Soft,very mildTTP of suprapubic region and LLQ withoutguarding, no peritonitis Skin: no rashes noted, warm and dry  Anti-infectives: Anti-infectives (From admission, onward)   Start     Dose/Rate Route Frequency Ordered Stop   10/26/18 1930  fluconazole (DIFLUCAN) tablet 150 mg     150 mg Oral  Once 10/26/18 1846 10/26/18 2021   10/20/18 2115  piperacillin-tazobactam (ZOSYN) IVPB 3.375 g     3.375 g 12.5 mL/hr over 240 Minutes Intravenous Every 8 hours 10/20/18 2107     10/20/18 2100  piperacillin-tazobactam (ZOSYN) IVPB 3.375 g  Status:  Discontinued     3.375 g 100 mL/hr over 30 Minutes Intravenous  Once  10/20/18 2052 10/21/18 0105      Lab Results:  Recent Labs    10/28/18 0332 10/29/18 0306  WBC 9.5 8.9  HGB 10.9* 11.3*  HCT 34.3* 35.4*  PLT 387 403*   BMET Recent Labs    10/28/18 0332 10/29/18 0306  NA 137 136  K 3.5 3.7  CL 106 107  CO2 20* 21*  GLUCOSE 104* 104*  BUN 8 9  CREATININE 0.90 1.08*  CALCIUM 8.6* 8.8*   PT/INR No results for input(s): LABPROT, INR in the last 72 hours. CMP     Component Value Date/Time   NA 136 10/29/2018 0306   K 3.7 10/29/2018 0306   CL 107 10/29/2018 0306   CO2 21 (L) 10/29/2018 0306   GLUCOSE 104 (H) 10/29/2018 0306   BUN 9 10/29/2018 0306   CREATININE 1.08 (H) 10/29/2018 0306   CREATININE 1.03 04/19/2017 1617   CALCIUM 8.8 (L) 10/29/2018 0306   PROT 7.3 10/29/2018 0306   ALBUMIN 3.2 (L) 10/29/2018 0306   AST 77 (H) 10/29/2018 0306   ALT 94 (H) 10/29/2018 0306   ALKPHOS 101 10/29/2018 0306   BILITOT 0.6 10/29/2018 0306   GFRNONAA 56 (L) 10/29/2018 0306   GFRNONAA 61 08/25/2013 1129   GFRAA >60 10/29/2018 0306   GFRAA 71 08/25/2013 1129   Lipase  No results found for: LIPASE  Studies/Results: No results found.    Kalman Drape , [facility] Surgery 10/29/2018, 9:34 AM  Pager: [phone] Mon-Wed, Friday 7:00am-4:30pm Thurs 7am-11:30am  Consults: [phone]

## 2018-10-29 NOTE — Progress Notes (Signed)
PROGRESS NOTE  Kaitlyn Harper [id number] DOB: [birth year] DOA: 10/20/2018 PCP: Debbrah Alar, NP   LOS: 9 days   Brief narrative: Kaitlyn Harper a 58 y.o.femalewith medical history significant forhypertension, diet-controlled diabetes mellitus, hyperlipidemia, depression, and anxiety, presented to the emergency department for evaluation of abdominal pain.Patient developed lower abdominal discomfort, fevers, chills, and nausea without vomiting approximately 1 week ago, was suspected to have UTI, was given a dose of Rocephin and started on Levaquin on 10/16/2018, continued to feel poorly with ongoing lower abdominal pain, was noted to have leukocytosis greater than 20,000 on outpatient blood work, and was directed to the ED for further evaluation of this.  Upon arrival to the ED, patient was found to be afebrile, saturating well on room air, slightly tachycardic, and with stable blood pressure. Chemistry panel was notable for potassium of 3.0 and mild elevation in transaminases. CBC features a leukocytosis to 20,800. CT of the abdomen and pelvis was concerning for acute sigmoid diverticulitis with perforation and multilocular pericolonic abscess. COVID-19 was negative.Patient was given a liter of lactated Ringer's, Zofran, Dilaudid, and Zosyn in the emergency department. Surgery was consulted by the ED physician and recommended medical admission.Surgery evaluated and recommended IR to evaluate for possible drain placement however there is no safe window for drain to be placed and they recommended a follow-up CT  Assessment/Plan:  Principal Problem:   Diverticulitis of large intestine with perforation and abscess Active Problems:   Essential hypertension   Hyperlipidemia   Tobacco abuse   Hypokalemia   Impaired glucose tolerance   Depressive disorder   Anemia   Anxiety   AV block, 1st degree  Acute Diverticulitis with perforation and abscess  Clinically improved  ,hemodynamically stable.  IR unable to access the abscess.  Currently on IV antibiotic.  Tolerating regular diet.  Surgery is following.  Plan is to repeat a CT scan tomorrow.  Patient deteriorates then plan is exploratory laparotomy.  Currently on IV Zosyn.  Leukocytosis trending down.  Afebrile.  Surgery planning for n.p.o. after midnight for CT in a.m.  Essential hypertension Continue amlodipine Coreg , irbesartan, PRN labetalol.  Blood pressure is controlled at this time.  We will continue to monitor.  Type II DM -Diet-controlled at home,  holding off on SSI for now and will continue to monitor carefully  Abnormal LFTs,  -Unclear etiology but suspect in the setting of Diverticulitis .  Latest AST of 77 and ALT of 94.  Will closely monitor.  Will follow up with CMP.  Overweight -Counseling done.  Lifestyle modification advised.  Metabolic Acidosis,  Resolved  Normocytic Anemia, slightly decreasing  Hemoglobin of 11.3.  Stable  Hypokalemia, improved Improved with replacement.  Stable at this time  Renal Insufficiency Stable at this time.  Vaginal yeast infection. Received 1 dose of p.o. fluconazole. improved  VTE Prophylaxis: SCD  Code Status: Full code  Family Communication: None  Disposition Plan: Pending clinical improvement.  Disposition likely Home likely in 2 to 3 days depending upon CT scan and the need for surgical intervention   Consultants:  General surgery, interventional radiology  Procedures:  None so far  Antibiotics: Anti-infectives (From admission, onward)   Start     Dose/Rate Route Frequency Ordered Stop   10/26/18 1930  fluconazole (DIFLUCAN) tablet 150 mg     150 mg Oral  Once 10/26/18 1846 10/26/18 2021   10/20/18 2115  piperacillin-tazobactam (ZOSYN) IVPB 3.375 g     3.375 g 12.5 mL/hr over 240 Minutes  Intravenous Every 8 hours 10/20/18 2107     10/20/18 2100  piperacillin-tazobactam (ZOSYN) IVPB 3.375 g  Status:  Discontinued      3.375 g 100 mL/hr over 30 Minutes Intravenous  Once 10/20/18 2052 10/21/18 0105       Subjective: Denies any fever, chills or rigor.  Denies abdominal pain nausea or vomiting.  Tolerating oral diet  Objective: Vitals:   10/28/18 2049 10/29/18 0623  BP: 111/71 123/75  Pulse: (!) 58 (!) 58  Resp: 18 18  Temp: 98.7 F (37.1 C) 98.2 F (36.8 C)  SpO2: 97% 99%    Intake/Output Summary (Last 24 hours) at 10/29/2018 1112 Last data filed at 10/29/2018 1004 Gross per 24 hour  Intake 1770.5 ml  Output 1600 ml  Net 170.5 ml   Filed Weights   10/26/18 0504 10/27/18 0500 10/28/18 0500  Weight: 67.6 kg 67.9 kg 69 kg   Body mass index is 26.11 kg/m.   Physical Exam: GENERAL: Patient is alert awake and oriented. Not in obvious distress. HENT: No scleral pallor or icterus. Pupils equally reactive to light. Oral mucosa is moist NECK: is supple, no palpable thyroid enlargement. CHEST: Clear to auscultation. No crackles or wheezes. Non tender on palpation. Diminished breath sounds bilaterally. CVS: S1 and S2 heard, no murmur. Regular rate and rhythm. No pericardial rub. ABDOMEN: Soft, mild tenderness over the suprapubic region left lower quadrant on palpation bowel sounds are present. No palpable hepato-splenomegaly. EXTREMITIES: No edema. CNS: Cranial nerves are intact. No focal motor or sensory deficits. SKIN: warm and dry without rashes.  Data Review: I have personally reviewed the following laboratory data and studies,  CBC: Recent Labs  Lab 10/25/18 0330 10/26/18 0419 10/27/18 0449 10/28/18 0332 10/29/18 0306  WBC 11.9* 11.2* 10.6* 9.5 8.9  NEUTROABS 8.5* 7.4 6.7 5.4 4.9  HGB 11.2* 11.3* 11.6* 10.9* 11.3*  HCT 33.8* 34.7* 36.2 34.3* 35.4*  MCV 94.2 94.0 95.5 96.1 96.5  PLT 367 379 395 387 [id number]*   Basic Metabolic Panel: Recent Labs  Lab 10/25/18 0330 10/26/18 0419 10/27/18 0449 10/28/18 0332 10/29/18 0306  NA 139 139 138 137 136  K 3.8 4.0 3.8 3.5 3.7  CL 104  104 105 106 107  CO2 26 23 22  20* 21*  GLUCOSE 106* 91 95 104* 104*  BUN <5* 5* 9 8 9   CREATININE 0.89 0.94 1.01* 0.90 1.08*  CALCIUM 8.8* 9.1 9.0 8.6* 8.8*  MG 2.1 2.1 2.3 2.2 2.2  PHOS 4.0 4.8* 4.5 3.9 3.9   Liver Function Tests: Recent Labs  Lab 10/25/18 0330 10/26/18 0419 10/27/18 0449 10/28/18 0332 10/29/18 0306  AST 29 42* 62* 60* 77*  ALT 44 51* 68* 72* 94*  ALKPHOS 113 120 109 105 101  BILITOT 0.9 0.6 0.7 0.6 0.6  PROT 7.1 7.6 7.5 7.0 7.3  ALBUMIN 3.1* 3.2* 3.3* 3.1* 3.2*   No results for input(s): LIPASE, AMYLASE in the last 168 hours. No results for input(s): AMMONIA in the last 168 hours. Cardiac Enzymes: No results for input(s): CKTOTAL, CKMB, CKMBINDEX, TROPONINI in the last 168 hours. BNP (last 3 results) No results for input(s): BNP in the last 8760 hours.  ProBNP (last 3 results) No results for input(s): PROBNP in the last 8760 hours.  CBG: Recent Labs  Lab 10/25/18 0755 10/26/18 0732 10/27/18 0748 10/28/18 0727 10/29/18 0732  GLUCAP 109* 88 95 87 102*   Recent Results (from the past 240 hour(s))  SARS Coronavirus 2 [facility] order, Performed in [facility] hospital lab) Nasopharyngeal Nasopharyngeal Swab     Status: None   Collection Time: 10/20/18  9:51 PM   Specimen: Nasopharyngeal Swab  Result Value Ref Range Status   SARS Coronavirus 2 NEGATIVE NEGATIVE Final    Comment: (NOTE) If result is NEGATIVE SARS-CoV-2 target nucleic acids are NOT DETECTED. The SARS-CoV-2 RNA is generally detectable in upper and lower  respiratory specimens during the acute phase of infection. The lowest  concentration of SARS-CoV-2 viral copies this assay can detect is 250  copies / mL. A negative result does not preclude SARS-CoV-2 infection  and should not be used as the sole basis for treatment or other  patient management decisions.  A negative result may occur with  improper specimen collection / handling, submission of specimen other  than nasopharyngeal  swab, presence of viral mutation(s) within the  areas targeted by this assay, and inadequate number of viral copies  (<250 copies / mL). A negative result must be combined with clinical  observations, patient history, and epidemiological information. If result is POSITIVE SARS-CoV-2 target nucleic acids are DETECTED. The SARS-CoV-2 RNA is generally detectable in upper and lower  respiratory specimens dur ing the acute phase of infection.  Positive  results are indicative of active infection with SARS-CoV-2.  Clinical  correlation with patient history and other diagnostic information is  necessary to determine patient infection status.  Positive results do  not rule out bacterial infection or co-infection with other viruses. If result is PRESUMPTIVE POSTIVE SARS-CoV-2 nucleic acids MAY BE PRESENT.   A presumptive positive result was obtained on the submitted specimen  and confirmed on repeat testing.  While 2019 novel coronavirus  (SARS-CoV-2) nucleic acids may be present in the submitted sample  additional confirmatory testing may be necessary for epidemiological  and / or clinical management purposes  to differentiate between  SARS-CoV-2 and other Sarbecovirus currently known to infect humans.  If clinically indicated additional testing with an alternate test  methodology [phone]) is advised. The SARS-CoV-2 RNA is generally  detectable in upper and lower respiratory sp ecimens during the acute  phase of infection. The expected result is Negative. Fact Sheet for Patients:  StrictlyIdeas.no Fact Sheet for Healthcare Providers: BankingDealers.co.za This test is not yet approved or cleared by the [place] FDA and has been authorized for detection and/or diagnosis of SARS-CoV-2 by FDA under an Emergency Use Authorization (EUA).  This EUA will remain in effect (meaning this test can be used) for the duration of the COVID-19 declaration  under Section 564(b)(1) of the Act, 21 U.S.C. section 360bbb-3(b)(1), unless the authorization is terminated or revoked sooner. Performed at [facility], [address] [place], [place] [address]   Culture, Urine     Status: None   Collection Time: 10/27/18  8:44 AM   Specimen: Urine, Random  Result Value Ref Range Status   Specimen Description   Final    URINE, RANDOM Performed at [facility], [place] [address], [place], [place] [address]    Special Requests   Final    NONE Performed at [facility], [place] [address] [place], [place] [address]    Culture   Final    NO GROWTH Performed at [facility], [place] [address], [place],  [address]    Report Status 10/28/2018 FINAL  Final     Studies: No results found.  Scheduled Meds: . amLODipine  10 mg Oral Daily   And  .  irbesartan  300 mg Oral Daily  . carvedilol  6.25 mg Oral BID WC  . enoxaparin (LOVENOX) injection  40 mg Subcutaneous Q24H    Continuous Infusions: . sodium chloride 250 mL (10/20/18 2104)  . methocarbamol (ROBAXIN) IV    . ondansetron (ZOFRAN) IV    . piperacillin-tazobactam (ZOSYN)  IV 3.375 g (10/29/18 0558)     Flora Lipps, MD  [facility] 10/29/2018

## 2018-10-30 ENCOUNTER — Inpatient Hospital Stay (HOSPITAL_COMMUNITY): Payer: 59

## 2018-10-30 LAB — COMPREHENSIVE METABOLIC PANEL
ALT: 108 U/L — ABNORMAL HIGH (ref 0–44)
AST: 81 U/L — ABNORMAL HIGH (ref 15–41)
Albumin: 3.2 g/dL — ABNORMAL LOW (ref 3.5–5.0)
Alkaline Phosphatase: 99 U/L (ref 38–126)
Anion gap: 7 (ref 5–15)
BUN: 9 mg/dL (ref 6–20)
CO2: 23 mmol/L (ref 22–32)
Calcium: 8.8 mg/dL — ABNORMAL LOW (ref 8.9–10.3)
Chloride: 107 mmol/L (ref 98–111)
Creatinine, Ser: 0.97 mg/dL (ref 0.44–1.00)
GFR calc Af Amer: >60 mL/min (ref >60)
GFR calc non Af Amer: >60 mL/min (ref >60)
Glucose, Bld: 123 mg/dL — ABNORMAL HIGH (ref 70–99)
Potassium: 3.5 mmol/L (ref 3.5–5.1)
Sodium: 137 mmol/L (ref 135–145)
Total Bilirubin: 0.5 mg/dL (ref 0.3–1.2)
Total Protein: 7.2 g/dL (ref 6.5–8.1)

## 2018-10-30 LAB — PHOSPHORUS: Phosphorus: 3.6 mg/dL (ref 2.5–4.6)

## 2018-10-30 LAB — CBC
HCT: 36.5 % (ref 36.0–46.0)
Hemoglobin: 11.5 g/dL — ABNORMAL LOW (ref 12.0–15.0)
MCH: 30.6 pg (ref 26.0–34.0)
MCHC: 31.5 g/dL (ref 30.0–36.0)
MCV: 97.1 fL (ref 80.0–100.0)
Platelets: 403 10*3/uL — ABNORMAL HIGH (ref 150–400)
RBC: 3.76 MIL/uL — ABNORMAL LOW (ref 3.87–5.11)
RDW: 13.4 % (ref 11.5–15.5)
WBC: 7.6 10*3/uL (ref 4.0–10.5)
nRBC: 0 % (ref 0.0–0.2)

## 2018-10-30 LAB — GLUCOSE, CAPILLARY: Glucose-Capillary: 96 mg/dL (ref 70–99)

## 2018-10-30 LAB — MAGNESIUM: Magnesium: 2.2 mg/dL (ref 1.7–2.4)

## 2018-10-30 MED ORDER — SACCHAROMYCES BOULARDII 250 MG PO CAPS
250.0000 mg | ORAL_CAPSULE | Freq: Two times a day (BID) | ORAL | Status: DC
  Administered 2018-10-30: 250 mg via ORAL
  Filled 2018-10-30: qty 1

## 2018-10-30 MED ORDER — FLUCONAZOLE 150 MG PO TABS
150.0000 mg | ORAL_TABLET | Freq: Once | ORAL | Status: AC
  Administered 2018-10-30: 150 mg via ORAL
  Filled 2018-10-30: qty 1

## 2018-10-30 MED ORDER — SACCHAROMYCES BOULARDII 250 MG PO CAPS: 250.0000 mg | ORAL_CAPSULE | Freq: Two times a day (BID) | ORAL | Status: DC

## 2018-10-30 MED ORDER — AMOXICILLIN-POT CLAVULANATE 875-125 MG PO TABS: 1.0000 | ORAL_TABLET | Freq: Two times a day (BID) | ORAL | 0 refills | Status: AC

## 2018-10-30 MED ORDER — IOHEXOL 300 MG/ML  SOLN
30.0000 mL | Freq: Once | INTRAMUSCULAR | Status: AC | PRN
  Administered 2018-10-30: 30 mL via ORAL

## 2018-10-30 MED ORDER — ACETAMINOPHEN 325 MG PO TABS: 650.0000 mg | ORAL_TABLET | Freq: Four times a day (QID) | ORAL | Status: DC | PRN

## 2018-10-30 MED ORDER — IOHEXOL 300 MG/ML  SOLN
100.0000 mL | Freq: Once | INTRAMUSCULAR | Status: AC | PRN
  Administered 2018-10-30: 100 mL via INTRAVENOUS

## 2018-10-30 MED ORDER — SODIUM CHLORIDE (PF) 0.9 % IJ SOLN
INTRAMUSCULAR | Status: AC
  Filled 2018-10-30: qty 50

## 2018-10-30 MED ORDER — FLUCONAZOLE 150 MG PO TABS: 150.0000 mg | ORAL_TABLET | Freq: Once | ORAL | 0 refills | Status: AC

## 2018-10-30 MED FILL — FLUCONAZOLE 150 MG TABS: 150 | 1 days supply | Qty: 1 | Fill #0

## 2018-10-30 MED FILL — AMOX-CLAV 875-125 MG TABLET: 875-125 | 14 days supply | Qty: 28 | Fill #0

## 2018-10-30 NOTE — Discharge Summary (Signed)
Physician Discharge Summary  Kaitlyn Harper [id number] DOB: [birth year] DOA: 10/20/2018  PCP: Debbrah Alar, NP  Admit date: 10/20/2018 Discharge date: 10/30/2018  Admitted From: Home  Discharge disposition: Home   Recommendations for Outpatient Follow-Up:    Follow up with general surgery and primary care physician as outpatient.   Discharge Diagnosis:   Principal Problem:   Diverticulitis of large intestine with perforation and abscess Active Problems:   Essential hypertension   Hyperlipidemia   Tobacco abuse   Hypokalemia   Impaired glucose tolerance   Depressive disorder   Anemia   Anxiety   AV block, 1st degree   Discharge Condition: Improved.  Diet recommendation: Soft diet, regular.  Wound care: None.  Code status: Full.   History of Present Illness:   Kaitlyn A Lylesis a 58 y.o.femalewith medical history significant forhypertension, diet-controlled diabetes mellitus, hyperlipidemia, depression, and anxiety, presented to the emergency department for evaluation of abdominal pain.Patient developed lower abdominal discomfort, fevers, chills, and nausea without vomiting approximately 1 week ago, was suspected to have UTI, was given a dose of Rocephin and started on Levaquin on 10/16/2018, continued to feel poorly with ongoing lower abdominal pain, was noted to have leukocytosis greater than 20,000 on outpatient blood work, and was directed to the ED for further evaluation of this.  Upon arrival to the ED, patient was found to be afebrile, saturating well on room air, slightly tachycardic, and with stable blood pressure. Chemistry panel was notable for potassium of 3.0 and mild elevation in transaminases. CBC features a leukocytosis to 20,800. CT of the abdomen and pelvis was concerning for acute sigmoid diverticulitis with perforation and multilocular pericolonic abscess. COVID-19 was negative.Patient was given a liter of lactated Ringer's,  Zofran, Dilaudid, and Zosyn in the emergency department. Surgery was consulted by the ED physician and recommended medical admission.Surgery evaluated and recommended IR to evaluate for possible drain placement however there is no safe window for drain to be placed and they recommended a follow-up CT.   Hospital Course:   Patient was admitted to the hospital and following conditions were addressed during hospitalization,  Acute Diverticulitis with perforation and abscess Clinically improved.  IR was unable to access the abscess.    Patient was treated conservatively with IV Zosyn.  Surgery followed the patient during hospitalization. Repeat CT scan from today showed decreasing fluid collection.  Patient did not have fever or leukocytosis prior to discharge.  Spoke with surgery prior to disposition.  Plan is to follow-up on 11/11/2018 and obtain a CT scan as outpatient for further follow-up..  Essential hypertension Continue amlodipine Coreg , irbesartan, at discharge.  Blood pressure is controlled at this time.    Type II DM -Diet-controlled at home  Abnormal LFTs,  -Unclear etiology but suspect in the setting of Diverticulitis .   AST of 81 and ALT of 108 at prior to discharge.  This will need to be followed up as outpatient.  Overweight -Counseling done.  Lifestyle modification advised.  Metabolic Acidosis,  Resolved  Normocytic Anemia, slightly decreasing  Hemoglobin of 11.3.  Stable  Hypokalemia, improved Improved with replacement.  Stable at this time  Renal Insufficiency Resolved.  Vaginal yeast infection. Received 1 dose of p.o. fluconazole. Improved  Disposition.  At this time, patient is stable for disposition home.  Patient will follow-up with general surgery as outpatient (scheduled) and will have to obtain a CT scan as outpatient during prior to follow-up.  Radiology to schedule an appointment.   Medical Consultants:  General  surgery  Subjective:   Today, patient feels overall okay.  Denies any pain, nausea, vomiting. Tolerating  Oral diet.  Discharge Exam:   Vitals:   10/30/18 0536 10/30/18 1304  BP: 122/79 131/81  Pulse: 62 (!) 57  Resp: 18 18  Temp: 98.8 F (37.1 C) 98 F (36.7 C)  SpO2: 100% 100%   Vitals:   10/29/18 1328 10/29/18 2018 10/30/18 0536 10/30/18 1304  BP: 114/71 131/76 122/79 131/81  Pulse: 63 (!) 56 62 (!) 57  Resp: 17 18 18 18   Temp: 98.2 F (36.8 C) 98.8 F (37.1 C) 98.8 F (37.1 C) 98 F (36.7 C)  TempSrc: Oral Oral Oral Oral  SpO2: 97% 99% 100% 100%  Weight:      Height:        General exam: Appears calm and comfortable ,Not in distress HEENT:PERRL,Oral mucosa moist Respiratory system: Bilateral equal air entry, normal vesicular breath sounds, no wheezes or crackles  Cardiovascular system: S1 & S2 heard, RRR.  Gastrointestinal system: Abdomen is nondistended, soft and mild suprapubic tenderness on palpation. No organomegaly or masses felt. Normal bowel sounds heard. Central nervous system: Alert and oriented. No focal neurological deficits. Extremities: No edema, no clubbing ,no cyanosis, distal peripheral pulses palpable. Skin: No rashes, lesions or ulcers,no icterus ,no pallor MSK: Normal muscle bulk,tone ,power    Procedures:    None  The results of significant diagnostics from this hospitalization (including imaging, microbiology, ancillary and laboratory) are listed below for reference.     Diagnostic Studies:   Ct Abdomen Pelvis W Contrast  Result Date: 10/20/2018 CLINICAL DATA:  Abdominal pain EXAM: CT ABDOMEN AND PELVIS WITH CONTRAST TECHNIQUE: Multidetector CT imaging of the abdomen and pelvis was performed using the standard protocol following bolus administration of intravenous contrast. CONTRAST:  167mL OMNIPAQUE IOHEXOL 300 MG/ML  SOLN COMPARISON:  None. FINDINGS: Lower chest: The visualized heart size within normal limits. No pericardial  fluid/thickening. No hiatal hernia. The visualized portions of the lungs are clear. Hepatobiliary: The liver is normal in density without focal abnormality.The main portal vein is patent. The patient is status post cholecystectomy. No biliary ductal dilation. Pancreas: Unremarkable. No pancreatic ductal dilatation or surrounding inflammatory changes. Spleen: Normal in size without focal abnormality. Adrenals/Urinary Tract: Both adrenal glands appear normal. There are bilateral low-density lesions seen within both kidneys. The largest in the upper pole of the left kidney measuring 1.5 cm, likely simple renal cysts. Stomach/Bowel: The stomach is normal in appearance. There is a duodenal diverticulum with retained food stuff seen within the third portion of the duodenum. The remainder of the small bowel is unremarkable.Surgical clips seen in the right lower quadrant. There is a 9 cm segment of sigmoid colonic diverticulitis with diffuse bowel wall thickening and surrounding extensive mesenteric fat stranding changes. Small amount of pneumoperitoneum is seen adjacent to the sigmoid colon. There is a multilocular collection extending around the sigmoid colon best seen on series 2, image 64, measuring approximately 7.0 x 2.7 x 3.6 cm. The collection has fluid and small foci of air and is peripherally enhancing. Vascular/Lymphatic: There are no enlarged mesenteric, retroperitoneal, or pelvic lymph nodes. No significant vascular findings are present. Reproductive: The uterus and adnexa are unremarkable. there is free fluid seen within the cul-de-sac.1 Other: No evidence of abdominal wall mass or hernia. Musculoskeletal: No acute or significant osseous findings. IMPRESSION: Acute sigmoid colonic diverticulitis with perforation and multilocular pericolonic abscess measuring 7.0 x 2.7 x3.6 cm. These results were called by  telephone at the time of interpretation on 10/20/2018 at 8:22 pm to Martinique Robinson PA, who verbally  acknowledged these results. Electronically Signed   By: [name] M.D.   On: 10/20/2018 20:26     Labs:   Basic Metabolic Panel: Recent Labs  Lab 10/26/18 0419 10/27/18 0449 10/28/18 0332 10/29/18 0306 10/30/18 0316  NA 139 138 137 136 137  K 4.0 3.8 3.5 3.7 3.5  CL 104 105 106 107 107  CO2 23 22 20* 21* 23  GLUCOSE 91 95 104* 104* 123*  BUN 5* 9 8 9 9   CREATININE 0.94 1.01* 0.90 1.08* 0.97  CALCIUM 9.1 9.0 8.6* 8.8* 8.8*  MG 2.1 2.3 2.2 2.2 2.2  PHOS 4.8* 4.5 3.9 3.9 3.6   GFR Estimated Creatinine Clearance: 58.8 mL/min (by C-G formula based on SCr of 0.97 mg/dL). Liver Function Tests: Recent Labs  Lab 10/26/18 0419 10/27/18 0449 10/28/18 0332 10/29/18 0306 10/30/18 0316  AST 42* 62* 60* 77* 81*  ALT 51* 68* 72* 94* 108*  ALKPHOS 120 109 105 101 99  BILITOT 0.6 0.7 0.6 0.6 0.5  PROT 7.6 7.5 7.0 7.3 7.2  ALBUMIN 3.2* 3.3* 3.1* 3.2* 3.2*   No results for input(s): LIPASE, AMYLASE in the last 168 hours. No results for input(s): AMMONIA in the last 168 hours. Coagulation profile No results for input(s): INR, PROTIME in the last 168 hours.  CBC: Recent Labs  Lab 10/25/18 0330 10/26/18 0419 10/27/18 0449 10/28/18 0332 10/29/18 0306 10/30/18 0316  WBC 11.9* 11.2* 10.6* 9.5 8.9 7.6  NEUTROABS 8.5* 7.4 6.7 5.4 4.9  --   HGB 11.2* 11.3* 11.6* 10.9* 11.3* 11.5*  HCT 33.8* 34.7* 36.2 34.3* 35.4* 36.5  MCV 94.2 94.0 95.5 96.1 96.5 97.1  PLT 367 379 395 387 403* 403*   Cardiac Enzymes: No results for input(s): CKTOTAL, CKMB, CKMBINDEX, TROPONINI in the last 168 hours. BNP: Invalid input(s): POCBNP CBG: Recent Labs  Lab 10/26/18 0732 10/27/18 0748 10/28/18 0727 10/29/18 0732 10/30/18 0734  GLUCAP 88 95 87 102* 96   D-Dimer No results for input(s): DDIMER in the last 72 hours. Hgb A1c No results for input(s): HGBA1C in the last 72 hours. Lipid Profile No results for input(s): CHOL, HDL, LDLCALC, TRIG, CHOLHDL, LDLDIRECT in the last 72  hours. Thyroid function studies No results for input(s): TSH, T4TOTAL, T3FREE, THYROIDAB in the last 72 hours.  Invalid input(s): FREET3 Anemia work up No results for input(s): VITAMINB12, FOLATE, FERRITIN, TIBC, IRON, RETICCTPCT in the last 72 hours. Microbiology Recent Results (from the past 240 hour(s))  SARS Coronavirus 2 [facility] order, Performed in [facility] hospital lab) Nasopharyngeal Nasopharyngeal Swab     Status: None   Collection Time: 10/20/18  9:51 PM   Specimen: Nasopharyngeal Swab  Result Value Ref Range Status   SARS Coronavirus 2 NEGATIVE NEGATIVE Final    Comment: (NOTE) If result is NEGATIVE SARS-CoV-2 target nucleic acids are NOT DETECTED. The SARS-CoV-2 RNA is generally detectable in upper and lower  respiratory specimens during the acute phase of infection. The lowest  concentration of SARS-CoV-2 viral copies this assay can detect is 250  copies / mL. A negative result does not preclude SARS-CoV-2 infection  and should not be used as the sole basis for treatment or other  patient management decisions.  A negative result may occur with  improper specimen collection / handling, submission of specimen other  than nasopharyngeal swab, presence of viral mutation(s) within the  areas targeted by this  assay, and inadequate number of viral copies  (<250 copies / mL). A negative result must be combined with clinical  observations, patient history, and epidemiological information. If result is POSITIVE SARS-CoV-2 target nucleic acids are DETECTED. The SARS-CoV-2 RNA is generally detectable in upper and lower  respiratory specimens dur ing the acute phase of infection.  Positive  results are indicative of active infection with SARS-CoV-2.  Clinical  correlation with patient history and other diagnostic information is  necessary to determine patient infection status.  Positive results do  not rule out bacterial infection or co-infection with other viruses. If result  is PRESUMPTIVE POSTIVE SARS-CoV-2 nucleic acids MAY BE PRESENT.   A presumptive positive result was obtained on the submitted specimen  and confirmed on repeat testing.  While 2019 novel coronavirus  (SARS-CoV-2) nucleic acids may be present in the submitted sample  additional confirmatory testing may be necessary for epidemiological  and / or clinical management purposes  to differentiate between  SARS-CoV-2 and other Sarbecovirus currently known to infect humans.  If clinically indicated additional testing with an alternate test  methodology [phone]) is advised. The SARS-CoV-2 RNA is generally  detectable in upper and lower respiratory sp ecimens during the acute  phase of infection. The expected result is Negative. Fact Sheet for Patients:  StrictlyIdeas.no Fact Sheet for Healthcare Providers: BankingDealers.co.za This test is not yet approved or cleared by the [place] FDA and has been authorized for detection and/or diagnosis of SARS-CoV-2 by FDA under an Emergency Use Authorization (EUA).  This EUA will remain in effect (meaning this test can be used) for the duration of the COVID-19 declaration under Section 564(b)(1) of the Act, 21 U.S.C. section 360bbb-3(b)(1), unless the authorization is terminated or revoked sooner. Performed at [facility], [address] [place], [place] [address]   Culture, Urine     Status: None   Collection Time: 10/27/18  8:44 AM   Specimen: Urine, Random  Result Value Ref Range Status   Specimen Description   Final    URINE, RANDOM Performed at [facility], [place] [address] [place], [place] [address]    Special Requests   Final    NONE Performed at [facility], [place] [address] [place], [place] [address]    Culture   Final    NO GROWTH Performed at [facility], [place] [address], [place], [place] [address]    Report Status 10/28/2018  FINAL  Final     Discharge Instructions:   Discharge Instructions    Call MD for:  persistant nausea and vomiting   Complete by: As directed    Call MD for:  severe uncontrolled pain   Complete by: As directed    Call MD for:  temperature >100.4   Complete by: As directed    Diet regular   Complete by: As directed    soft   Discharge instructions   Complete by: As directed    Follow-up with general surgery as outpatient on 11/11/18.  Clinic will schedule an appointment and CT scan with you.  Complete the course of antibiotic.  Follow-up with your primary care physician in 1 to 2 weeks.   Increase activity slowly   Complete by: As directed      Allergies as of 10/30/2018      Reactions   Sulfonamide Derivatives Hives, Other (See Comments)   Light sensitivity      Medication List    STOP  taking these medications   levofloxacin 750 MG tablet Commonly known as: LEVAQUIN     TAKE these medications   acetaminophen 325 MG tablet Commonly known as: TYLENOL Take 2 tablets (650 mg total) by mouth every 6 (six) hours as needed for mild pain (or Fever >/= 101).   albuterol 108 (90 Base) MCG/ACT inhaler Commonly known as: VENTOLIN HFA Inhale 2 puffs into the lungs every 6 (six) hours as needed for wheezing or shortness of breath.   amLODipine-valsartan 10-320 MG tablet Commonly known as: EXFORGE TAKE 1 TABLET BY MOUTH ONCE DAILY   amoxicillin-clavulanate 875-125 MG tablet Commonly known as: Augmentin Take 1 tablet by mouth 2 (two) times daily for 14 days.   ARIPiprazole 10 MG tablet Commonly known as: ABILIFY Take 10 mg by mouth daily after breakfast.   aspirin EC 81 MG tablet Take 81 mg by mouth daily.   atorvastatin 20 MG tablet Commonly known as: LIPITOR TAKE 1 TABLET BY MOUTH ONCE DAILY What changed: when to take this   carvedilol 6.25 MG tablet Commonly known as: COREG TAKE 1 TABLET BY MOUTH TWICE DAILY WITH A MEAL What changed: See the new instructions.    lamoTRIgine 100 MG tablet Commonly known as: LAMICTAL Take 100 mg by mouth 2 (two) times daily.   ondansetron 4 MG disintegrating tablet Commonly known as: Zofran ODT Take 1 tablet (4 mg total) by mouth every 8 (eight) hours as needed for nausea or vomiting.   saccharomyces boulardii 250 MG capsule Commonly known as: FLORASTOR Take 1 capsule (250 mg total) by mouth 2 (two) times daily. You can find a probiotic over the counter.   traZODone 50 MG tablet Commonly known as: DESYREL Take 100 mg by mouth at bedtime.   triamcinolone ointment 0.1 % Commonly known as: KENALOG Apply 1 application topically 2 (two) times daily as needed (rash).      Follow-up Information    Michael Boston, MD. Go on 11/11/2018.   Specialty: General Surgery Why: 11/11/2018 at 9:30am. Please arrive 30 minutes prior to your appointment to check in and fill out paperwork. Bring photo ID and insurance information. You will need to have a CT scan prior to this appointment; radiology will call you to schedule. Contact information: [address] [place] [address] [phone]        Debbrah Alar, NP. Schedule an appointment as soon as possible for a visit in 2 week(s).   Specialty: Internal Medicine Contact information: [place] [address] [phone]           Time coordinating discharge: 39 minutes  Signed:  Ozzie Knobel  [facility] 10/30/2018, 2:36 PM

## 2018-10-30 NOTE — Progress Notes (Signed)
PROGRESS NOTE  Kaitlyn Harper [id number] DOB: 01/01/1961 DOA: 10/20/2018 PCP: Debbrah Alar, NP   LOS: 10 days   Brief narrative: Kaitlyn Harper a 58 y.o.femalewith medical history significant forhypertension, diet-controlled diabetes mellitus, hyperlipidemia, depression, and anxiety, presented to the emergency department for evaluation of abdominal pain.Patient developed lower abdominal discomfort, fevers, chills, and nausea without vomiting approximately 1 week ago, was suspected to have UTI, was given a dose of Rocephin and started on Levaquin on 10/16/2018, continued to feel poorly with ongoing lower abdominal pain, was noted to have leukocytosis greater than 20,000 on outpatient blood work, and was directed to the ED for further evaluation of this.  Upon arrival to the ED, patient was found to be afebrile, saturating well on room air, slightly tachycardic, and with stable blood pressure. Chemistry panel was notable for potassium of 3.0 and mild elevation in transaminases. CBC features a leukocytosis to 20,800. CT of the abdomen and pelvis was concerning for acute sigmoid diverticulitis with perforation and multilocular pericolonic abscess. COVID-19 was negative.Patient was given a liter of lactated Ringer's, Zofran, Dilaudid, and Zosyn in the emergency department. Surgery was consulted by the ED physician and recommended medical admission.Surgery evaluated and recommended IR to evaluate for possible drain placement however there is no safe window for drain to be placed and they recommended a follow-up CT.  Assessment/Plan:  Principal Problem:   Diverticulitis of large intestine with perforation and abscess Active Problems:   Essential hypertension   Hyperlipidemia   Tobacco abuse   Hypokalemia   Impaired glucose tolerance   Depressive disorder   Anemia   Anxiety   AV block, 1st degree  Acute Diverticulitis with perforation and abscess  Clinically improved  ,hemodynamically stable.  IR unable to access the abscess.  Currently on IV antibiotic.  Tolerating regular diet.  Surgery is following.  Repeat CT scan from today showed decreasing fluid collection. The plan is if the patient deteriorates then plan is exploratory laparotomy.  Currently on IV Zosyn.  Leukocytosis trending down.  Afebrile.  Will follow surgical recommendation.  Essential hypertension Continue amlodipine Coreg , irbesartan, PRN labetalol.  Blood pressure is controlled at this time.  We will continue to monitor.  Type II DM -Diet-controlled at home,  holding off on SSI for now and will continue to monitor carefully  Abnormal LFTs,  -Unclear etiology but suspect in the setting of Diverticulitis .  Will trend.  AST of 81 and ALT of 108 at this time.  Overweight -Counseling done.  Lifestyle modification advised.  Metabolic Acidosis,  Resolved  Normocytic Anemia, slightly decreasing  Hemoglobin of 11.3.  Stable  Hypokalemia, improved Improved with replacement.  Stable at this time  Renal Insufficiency Resolved.  Vaginal yeast infection. Received 1 dose of p.o. fluconazole. improved  VTE Prophylaxis: SCD  Code Status: Full code  Family Communication: None  Disposition Plan: Pending clinical improvement.  Disposition likely Home , will follow surgical recommendation  Consultants:  General surgery, interventional radiology  Procedures:  None so far  Antibiotics: Anti-infectives (From admission, onward)   Start     Dose/Rate Route Frequency Ordered Stop   10/26/18 1930  fluconazole (DIFLUCAN) tablet 150 mg     150 mg Oral  Once 10/26/18 1846 10/26/18 2021   10/20/18 2115  piperacillin-tazobactam (ZOSYN) IVPB 3.375 g     3.375 g 12.5 mL/hr over 240 Minutes Intravenous Every 8 hours 10/20/18 2107     10/20/18 2100  piperacillin-tazobactam (ZOSYN) IVPB 3.375 g  Status:  Discontinued     3.375 g 100 mL/hr over 30 Minutes Intravenous  Once 10/20/18  2052 10/21/18 0105      Subjective: Today, patient denies any nausea, vomiting, abdominal pain.  No fever chills or rigor.  Tolerating oral diet.  Objective: Vitals:   10/30/18 0536 10/30/18 1304  BP: 122/79 131/81  Pulse: 62 (!) 57  Resp: 18 18  Temp: 98.8 F (37.1 C) 98 F (36.7 C)  SpO2: 100% 100%    Intake/Output Summary (Last 24 hours) at 10/30/2018 1358 Last data filed at 10/30/2018 1356 Gross per 24 hour  Intake 1270 ml  Output 2650 ml  Net -1380 ml   Filed Weights   10/26/18 0504 10/27/18 0500 10/28/18 0500  Weight: 67.6 kg 67.9 kg 69 kg   Body mass index is 26.11 kg/m.   Physical Exam: GENERAL: Patient is alert awake and oriented. Not in obvious distress. HENT: No scleral pallor or icterus. Pupils equally reactive to light. Oral mucosa is moist NECK: is supple, no palpable thyroid enlargement. CHEST: Clear to auscultation. No crackles or wheezes. Non tender on palpation. Diminished breath sounds bilaterally. CVS: S1 and S2 heard, no murmur. Regular rate and rhythm. No pericardial rub. ABDOMEN: Soft, mild tenderness over the suprapubic, left lower quadrant on palpation bowel sounds are present. No palpable hepato-splenomegaly.  EXTREMITIES: No edema. CNS: Cranial nerves are intact. No focal motor or sensory deficits. SKIN: warm and dry without rashes.  Data Review: I have personally reviewed the following laboratory data and studies,  CBC: Recent Labs  Lab 10/25/18 0330 10/26/18 0419 10/27/18 0449 10/28/18 0332 10/29/18 0306 10/30/18 0316  WBC 11.9* 11.2* 10.6* 9.5 8.9 7.6  NEUTROABS 8.5* 7.4 6.7 5.4 4.9  --   HGB 11.2* 11.3* 11.6* 10.9* 11.3* 11.5*  HCT 33.8* 34.7* 36.2 34.3* 35.4* 36.5  MCV 94.2 94.0 95.5 96.1 96.5 97.1  PLT 367 379 395 387 403* [id number]*   Basic Metabolic Panel: Recent Labs  Lab 10/26/18 0419 10/27/18 0449 10/28/18 0332 10/29/18 0306 10/30/18 0316  NA 139 138 137 136 137  K 4.0 3.8 3.5 3.7 3.5  CL 104 105 106 107 107  CO2  23 22 20* 21* 23  GLUCOSE 91 95 104* 104* 123*  BUN 5* 9 8 9 9   CREATININE 0.94 1.01* 0.90 1.08* 0.97  CALCIUM 9.1 9.0 8.6* 8.8* 8.8*  MG 2.1 2.3 2.2 2.2 2.2  PHOS 4.8* 4.5 3.9 3.9 3.6   Liver Function Tests: Recent Labs  Lab 10/26/18 0419 10/27/18 0449 10/28/18 0332 10/29/18 0306 10/30/18 0316  AST 42* 62* 60* 77* 81*  ALT 51* 68* 72* 94* 108*  ALKPHOS 120 109 105 101 99  BILITOT 0.6 0.7 0.6 0.6 0.5  PROT 7.6 7.5 7.0 7.3 7.2  ALBUMIN 3.2* 3.3* 3.1* 3.2* 3.2*   No results for input(s): LIPASE, AMYLASE in the last 168 hours. No results for input(s): AMMONIA in the last 168 hours. Cardiac Enzymes: No results for input(s): CKTOTAL, CKMB, CKMBINDEX, TROPONINI in the last 168 hours. BNP (last 3 results) No results for input(s): BNP in the last 8760 hours.  ProBNP (last 3 results) No results for input(s): PROBNP in the last 8760 hours.  CBG: Recent Labs  Lab 10/26/18 0732 10/27/18 0748 10/28/18 0727 10/29/18 0732 10/30/18 0734  GLUCAP 88 95 87 102* 96   Recent Results (from the past 240 hour(s))  SARS Coronavirus 2 [facility] order, Performed in [facility] hospital lab) Nasopharyngeal Nasopharyngeal Swab     Status: None  Collection Time: 10/20/18  9:51 PM   Specimen: Nasopharyngeal Swab  Result Value Ref Range Status   SARS Coronavirus 2 NEGATIVE NEGATIVE Final    Comment: (NOTE) If result is NEGATIVE SARS-CoV-2 target nucleic acids are NOT DETECTED. The SARS-CoV-2 RNA is generally detectable in upper and lower  respiratory specimens during the acute phase of infection. The lowest  concentration of SARS-CoV-2 viral copies this assay can detect is 250  copies / mL. A negative result does not preclude SARS-CoV-2 infection  and should not be used as the sole basis for treatment or other  patient management decisions.  A negative result may occur with  improper specimen collection / handling, submission of specimen other  than nasopharyngeal swab, presence of viral  mutation(s) within the  areas targeted by this assay, and inadequate number of viral copies  (<250 copies / mL). A negative result must be combined with clinical  observations, patient history, and epidemiological information. If result is POSITIVE SARS-CoV-2 target nucleic acids are DETECTED. The SARS-CoV-2 RNA is generally detectable in upper and lower  respiratory specimens dur ing the acute phase of infection.  Positive  results are indicative of active infection with SARS-CoV-2.  Clinical  correlation with patient history and other diagnostic information is  necessary to determine patient infection status.  Positive results do  not rule out bacterial infection or co-infection with other viruses. If result is PRESUMPTIVE POSTIVE SARS-CoV-2 nucleic acids MAY BE PRESENT.   A presumptive positive result was obtained on the submitted specimen  and confirmed on repeat testing.  While 2019 novel coronavirus  (SARS-CoV-2) nucleic acids may be present in the submitted sample  additional confirmatory testing may be necessary for epidemiological  and / or clinical management purposes  to differentiate between  SARS-CoV-2 and other Sarbecovirus currently known to infect humans.  If clinically indicated additional testing with an alternate test  methodology [phone]) is advised. The SARS-CoV-2 RNA is generally  detectable in upper and lower respiratory sp ecimens during the acute  phase of infection. The expected result is Negative. Fact Sheet for Patients:  StrictlyIdeas.no Fact Sheet for Healthcare Providers: BankingDealers.co.za This test is not yet approved or cleared by the [place] FDA and has been authorized for detection and/or diagnosis of SARS-CoV-2 by FDA under an Emergency Use Authorization (EUA).  This EUA will remain in effect (meaning this test can be used) for the duration of the COVID-19 declaration under Section 564(b)(1)  of the Act, 21 U.S.C. section 360bbb-3(b)(1), unless the authorization is terminated or revoked sooner. Performed at [facility], [address] [place], [place] [address]   Culture, Urine     Status: None   Collection Time: 10/27/18  8:44 AM   Specimen: Urine, Random  Result Value Ref Range Status   Specimen Description   Final    URINE, RANDOM Performed at [facility], [place] [address], [place], [place] [address]    Special Requests   Final    NONE Performed at [facility], [place] [address], [place], [place] [address]    Culture   Final    NO GROWTH Performed at [facility], [place] [address] [place], [place] [address]    Report Status 10/28/2018 FINAL  Final     Studies: Ct Abdomen Pelvis W Contrast  Result Date: 10/30/2018 CLINICAL DATA:  58 year old female with follow-up for diverticulitis EXAM: CT ABDOMEN AND PELVIS WITH CONTRAST TECHNIQUE: Multidetector CT imaging of the  abdomen and pelvis was performed using the standard protocol following bolus administration of intravenous contrast. CONTRAST:  115mL OMNIPAQUE IOHEXOL 300 MG/ML SOLN, 12mL OMNIPAQUE IOHEXOL 300 MG/ML SOLN COMPARISON:  . CT of the abdomen pelvis dated 10/24/2018. FINDINGS: Lower chest: The visualized lung bases are clear. Coronary vascular calcification involving the LAD. No intra-abdominal free air. No free fluid. Hepatobiliary: The liver is unremarkable. Mild dilatation of the biliary trees, likely post cholecystectomy. No retained calcified stone noted in the central CBD. Pancreas: Unremarkable. No pancreatic ductal dilatation or surrounding inflammatory changes. Spleen: Normal in size without focal abnormality. Adrenals/Urinary Tract: The adrenal glands are unremarkable. There is no hydronephrosis on either side. There is symmetric enhancement and excretion of contrast by both kidneys. Multiple small left renal hypodense lesions measure up to 12 mm. The  larger lesions demonstrate fluid attenuation most consistent with cysts and the smaller lesions are too small to characterize. The visualized ureters and urinary bladder appear unremarkable. Stomach/Bowel: There are two fluid collections along the sigmoid colon corresponding to the diverticular abscess is seen the prior CT. Overall there has been interval decrease in the size of these fluid collection compared to the prior CT. The larger collection measures approximately 6.2 x 3.3 cm (previously 7.7 x 5.0 cm). There is no bowel obstruction. Appendectomy. Vascular/Lymphatic: Mild aortoiliac atherosclerotic disease. The IVC is unremarkable. No portal venous gas. There is no adenopathy. Reproductive: The uterus is anteverted and grossly unremarkable. No suspicious adnexal masses. Other: A 9 mm nodular density in the subcutaneous soft tissues of the right anterior abdominal wall (series 2, image 43) is not well characterized but may represent area of fat contusion. Musculoskeletal: No acute or significant osseous findings. IMPRESSION: Interval decrease in the size of the sigmoid diverticular abscesses compared to the prior CT. Aortic Atherosclerosis (ICD10-I70.0). Electronically Signed   By: Anner Crete M.D.   On: 10/30/2018 13:03    Scheduled Meds:  amLODipine  10 mg Oral Daily   And   irbesartan  300 mg Oral Daily   carvedilol  6.25 mg Oral BID WC   enoxaparin (LOVENOX) injection  40 mg Subcutaneous Q24H   sodium chloride (PF)        Continuous Infusions:  sodium chloride 250 mL (10/20/18 2104)   methocarbamol (ROBAXIN) IV     ondansetron (ZOFRAN) IV     piperacillin-tazobactam (ZOSYN)  IV Stopped (10/30/18 [id number])     Flora Lipps, MD  [facility] 10/30/2018

## 2018-10-30 NOTE — Progress Notes (Signed)
[facility]/Trauma Progress Note      Assessment/Plan HTN Type 2 diabetes Depression Anxiety  Acute sigmoid diverticulitis with perforation and pericolonic abscess 7X2.7X 3.6cm -IRunable to access abscess -Continue IV antibiotics -Patient is tolerating regular diet without pain -Repeat CT scan  today pending -Patient is clinically improving but if she deteriorates she will need ex lap.  FEN:N.p.o. VTE: SCD's,Lovenox [id number]  7.6, afebrile Foley:None Follow up:TBD   LOS: 10 days    Subjective: CC: No complaints  No abdominal pain at rest.  No issues overnight.  Patient denies nausea, vomiting, fever or chills.  She is drinking her contrast for her CT scan.  Objective: Vital signs in last 24 hours: Temp:  [98.2 F (36.8 C)-98.8 F (37.1 C)] 98.8 F (37.1 C) (09/17 0536) Pulse Rate:  [56-63] 62 (09/17 0536) Resp:  [17-18] 18 (09/17 0536) BP: (114-131)/(71-79) 122/79 (09/17 0536) SpO2:  [97 %-100 %] 100 % (09/17 0536) Last BM Date: 10/27/18  Intake/Output from previous day: 09/16 0701 - 09/17 0700 In: 1894.6 [P.O.:1680; I.V.:120; IV Piggyback:94.6] Out: 1950 [Urine:1950] Intake/Output this shift: Total I/O In: 0  Out: 550 [Urine:550]  PE: Gen: Alert, NAD, pleasant, cooperative, well-appearing Pulm:Rate andeffort normal Abd: Soft,very mildTTP of suprapubic region and LLQ withoutguarding, no peritonitis Skin: no rashes noted, warm and dry   Anti-infectives: Anti-infectives (From admission, onward)   Start     Dose/Rate Route Frequency Ordered Stop   10/26/18 1930  fluconazole (DIFLUCAN) tablet 150 mg     150 mg Oral  Once 10/26/18 1846 10/26/18 2021   10/20/18 2115  piperacillin-tazobactam (ZOSYN) IVPB 3.375 g     3.375 g 12.5 mL/hr over 240 Minutes Intravenous Every 8 hours 10/20/18 2107     10/20/18 2100  piperacillin-tazobactam (ZOSYN) IVPB 3.375 g  Status:  Discontinued     3.375 g 100 mL/hr over 30  Minutes Intravenous  Once 10/20/18 2052 10/21/18 0105      Lab Results:  Recent Labs    10/29/18 0306 10/30/18 0316  WBC 8.9 7.6  HGB 11.3* 11.5*  HCT 35.4* 36.5  PLT 403* 403*   BMET Recent Labs    10/29/18 0306 10/30/18 0316  NA 136 137  K 3.7 3.5  CL 107 107  CO2 21* 23  GLUCOSE 104* 123*  BUN 9 9  CREATININE 1.08* 0.97  CALCIUM 8.8* 8.8*   PT/INR No results for input(s): LABPROT, INR in the last 72 hours. CMP     Component Value Date/Time   NA 137 10/30/2018 0316   K 3.5 10/30/2018 0316   CL 107 10/30/2018 0316   CO2 23 10/30/2018 0316   GLUCOSE 123 (H) 10/30/2018 0316   BUN 9 10/30/2018 0316   CREATININE 0.97 10/30/2018 0316   CREATININE 1.03 04/19/2017 1617   CALCIUM 8.8 (L) 10/30/2018 0316   PROT 7.2 10/30/2018 0316   ALBUMIN 3.2 (L) 10/30/2018 0316   AST 81 (H) 10/30/2018 0316   ALT 108 (H) 10/30/2018 0316   ALKPHOS 99 10/30/2018 0316   BILITOT 0.5 10/30/2018 0316   GFRNONAA >60 10/30/2018 0316   GFRNONAA 61 08/25/2013 1129   GFRAA >60 10/30/2018 0316   GFRAA 71 08/25/2013 1129   Lipase  No results found for: LIPASE  Studies/Results: No results found.    Kalman Drape , [facility] 10/30/2018, 10:40 AM  Pager: [phone] Mon-Wed, Friday 7:00am-4:30pm Thurs 7am-11:30am  Consults: [phone]

## 2018-10-30 NOTE — Plan of Care (Signed)
Reviewed discharge instructions with patient; copy given. IV removed. Patient ready for discharge.  

## 2018-10-30 NOTE — Plan of Care (Signed)
Patient lying in bed this morning. Pain controlled. No needs expressed. Will continue to monitor.

## 2018-10-30 NOTE — Discharge Instructions (Addendum)
Diverticulitis ° °Diverticulitis is when small pockets in your large intestine (colon) get infected or swollen. This causes stomach pain and watery poop (diarrhea). °These pouches are called diverticula. They form in people who have a condition called diverticulosis. °Follow these instructions at home: °Medicines °· Take over-the-counter and prescription medicines only as told by your doctor. These include: °? Antibiotics. °? Pain medicines. °? Fiber pills. °? Probiotics. °? Stool softeners. °· Do not drive or use heavy machinery while taking prescription pain medicine. °· If you were prescribed an antibiotic, take it as told. Do not stop taking it even if you feel better. °General instructions ° °· Follow a diet as told by your doctor. °· When you feel better, your doctor may tell you to change your diet. You may need to eat a lot of fiber. Fiber makes it easier to poop (have bowel movements). Healthy foods with fiber include: °? Berries. °? Beans. °? Lentils. °? Green vegetables. °· Exercise 3 or more times a week. Aim for 30 minutes each time. Exercise enough to sweat and make your heart beat faster. °· Keep all follow-up visits as told. This is important. You may need to have an exam of the large intestine. This is called a colonoscopy. °Contact a doctor if: °· Your pain does not get better. °· You have a hard time eating or drinking. °· You are not pooping like normal. °Get help right away if: °· Your pain gets worse. °· Your problems do not get better. °· Your problems get worse very fast. °· You have a fever. °· You throw up (vomit) more than one time. °· You have poop that is: °? Bloody. °? Black. °? Tarry. °Summary °· Diverticulitis is when small pockets in your large intestine (colon) get infected or swollen. °· Take medicines only as told by your doctor. °· Follow a diet as told by your doctor. °This information is not intended to replace advice given to you by your health care provider. Make sure you  discuss any questions you have with your health care provider. °Document Released: 07/18/2007 Document Revised: 01/11/2017 Document Reviewed: 02/16/2016 °Elsevier Patient Education © 2020 Elsevier Inc. ° ° ° °Low-Fiber Eating Plan °Fiber is found in fruits, vegetables, whole grains, and beans. Eating a diet low in fiber helps to reduce how often you have bowel movements and how much you produce during a bowel movement. A low-fiber eating plan may help your digestive system heal if: °· You have certain conditions, such as Crohn's disease or diverticulitis. °· You recently had radiation therapy on your pelvis or bowel. °· You recently had intestinal surgery. °· You have a new surgical opening in your abdomen (colostomy or ileostomy). °· Your intestine is narrowed (stricture). °Your health care provider will determine how long you need to stay on this diet. Your health care provider may recommend that you work with a diet and nutrition specialist (dietitian). °What are tips for following this plan? °General guidelines °· Follow recommendations from your dietitian about how much fiber you should have each day. °· Most people on this eating plan should try to eat less than 10 grams (g) of fiber each day. Your daily fiber goal is _________________ g. °· Take vitamin and mineral supplements as told by your health care provider or dietitian. Chewable or liquid forms are best when on this eating plan. °Reading food labels °· Check food labels for the amount of dietary fiber. °· Choose foods that have less than 2 grams of   fiber in one serving. Cooking  Use white flour and other allowed grains for baking and cooking.  Cook meat using methods that keep it tender, such as braising or poaching.  Cook eggs until the yolk is completely solid.  Cook with healthy oils, such as olive oil or canola oil. Meal planning   Eat 5-6 small meals throughout the day instead of 3 large meals.  If you are lactose  intolerant: ? Choose low-lactose dairy foods. ? Do not eat dairy foods, if told by your dietitian.  Limit fat and oils to less than 8 teaspoons a day.  Eat small portions of desserts. What foods are allowed? The items listed below may not be a complete list. Talk with your dietitian about what dietary choices are best for you. Grains All bread and crackers made with white flour. Waffles, pancakes, and [place] toast. Bagels. Pretzels. Melba toast, zwieback, and matzoh. Cooked and dried cereals that do not contain whole grains, added fiber, seeds, or dried fruit. CornmealDomenick Gong. Hot and cold cereals made with refined corn, wheat, rice, or oats. Plain pasta and noodles. White rice. Vegetables Well-cooked or canned vegetables without skin, seeds, or stems. Cooked potatoes without skins. Vegetable juice. Fruits Soft-cooked or canned fruits without skin and seeds. Peeled ripe banana. Applesauce. Fruit juice without pulp. Meats and other protein foods Ground meat. Tender cuts of meat or poultry. Eggs. Fish, seafood, and shellfish. Smooth nut butters. Tofu. Dairy All milk products and drinks. Lactose-free milks, including rice, soy, and almond milks. Yogurt without fruit, nuts, chocolate, or granola mix-ins. Sour cream. Cottage cheese. Cheese. Beverages Decaf coffee. Fruit and vegetable juices or smoothies (in small amounts, with no pulp or skins, and with fruits from allowed list). Sports drinks. Herbal tea. Fats and oils Olive oil, canola oil, sunflower oil, flaxseed oil, and grapeseed oil. Mayonnaise. Cream cheese. Margarine. Butter. Sweets and desserts Plain cakes and cookies. Cream pies and pies made with allowed fruits. Pudding. Custard. Fruit gelatin. Sherbet. Popsicles. Ice cream without nuts. Plain hard candy. Honey. Jelly. Molasses. Syrups, including chocolate syrup. Chocolate. Marshmallows. Gumdrops. Seasoning and other foods Bouillon. Broth. Cream soups made from allowed foods.  Strained soup. Casseroles made with allowed foods. Ketchup. Mild mustard. Mild salad dressings. Plain gravies. Vinegar. Spices in moderation. Salt. Sugar. What foods are not allowed? The items listed below may not be a complete list. Talk with your dietitian about what dietary choices are best for you. Grains Whole wheat and whole grain breads and crackers. Multigrain breads and crackers. Rye bread. Whole grain or multigrain cereals. Cereals with nuts, raisins, or coconut. Bran. Coarse wheat cereals. Granola. High-fiber cereals. Cornmeal or corn bread. Whole grain pasta. Wild or brown rice. Quinoa. Popcorn. Buckwheat. Wheat germ. Vegetables Potato skins. Raw or undercooked vegetables. All beans and bean sprouts. Cooked greens. Corn. Peas. Cabbage. Beets. Broccoli. Brussels sprouts. Cauliflower. Mushrooms. Onions. Peppers. Parsnips. Okra. Sauerkraut. Fruit Raw or dried fruit. Berries. Fruit juice with pulp. Prune juice. Meats and other protein foods Tough, fibrous meats with gristle. Fatty meat. Poultry with skin. Fried meat, Sales executive, or fish. Deli or lunch meats. Sausage, bacon, and hot dogs. Nuts and chunky nut butter. Dried peas, beans, and lentils. Dairy Yogurt with fruit, nuts, chocolate, or granola mix-ins. Beverages Caffeinated coffee and teas. Fats and oils Avocado. Coconut. Sweets and desserts Desserts, cookies, or candies that contain nuts or coconut. Dried fruit. Jams and preserves with seeds. Marmalade. Any dessert made with fruits or grains that are not allowed. Seasoning and other  foods Corn tortilla chips. Soups made with vegetables or grains that are not allowed. Relish. Horseradish. Angie Fava. Olives. Summary  Most people on a low-fiber eating plan should eat less than 10 grams of fiber a day. Follow recommendations from your dietitian about how much fiber you should have each day.  Always check food labels to see the dietary fiber content of packaged foods. In general, a  low-fiber food will have fewer than 2 grams of fiber per serving.  In general, try to avoid whole grains, raw fruits and vegetables, dried fruit, tough cuts of meat, nuts, and seeds.  Take a vitamin and mineral supplement as told by your health care provider or dietitian. This information is not intended to replace advice given to you by your health care provider. Make sure you discuss any questions you have with your health care provider. Document Released: 07/21/2001 Document Revised: 05/23/2018 Document Reviewed: 04/03/2016 Elsevier Patient Education  2020 [employer].

## 2018-10-31 ENCOUNTER — Other Ambulatory Visit: Payer: Self-pay | Admitting: *Deleted

## 2018-10-31 ENCOUNTER — Telehealth: Payer: Self-pay | Admitting: Family

## 2018-10-31 ENCOUNTER — Encounter: Payer: Self-pay | Admitting: *Deleted

## 2018-10-31 ENCOUNTER — Telehealth: Payer: Self-pay | Admitting: *Deleted

## 2018-10-31 DIAGNOSIS — D259 Leiomyoma of uterus, unspecified: Secondary | ICD-10-CM | POA: Insufficient documentation

## 2018-10-31 NOTE — Telephone Encounter (Signed)
Please contact pt to schedule in office hospital follow up visit next week.

## 2018-10-31 NOTE — Patient Outreach (Addendum)
[facility]) Care Management  10/31/2018  Kaitlyn Harper 10/26/1960 [id number]  Transition of care call/case closure   Referral received: 10/21/18 Initial outreach: 10/31/18 Insurance: [place] [employer]   Subjective: Initial successful telephone call to patient's preferred number in order to complete transition of care assessment; 2 HIPAA identifiers verified. Explained purpose of call and completed transition of care assessment.  Kaitlyn Harper states she is doing OK, tolerating soft diet, denies bowel or bladder problems.  Daughter is assisting with her recovery.  Kaitlyn Harper says she will eventually require colon surgery.  She states her chronic health issues are well controled with her medications and she does not feels she needs a referral to one of the [place] chronic disease management programs.  She says she does have the hospital indemnity and would appreciate if this RNCM would e-mail her the [employer] contact information.  She says she uses a Ambulance person- the [facility]. She denies educational needs related to staying safe during the COVID 19 pandemic.    Objective:  Kaitlyn Harper was hospitalized at [facility] from 9/7-9/17/2020 for a bowel perforation related to a diverticular abscess. She was treated conservatively and no surgery was performed.  Comorbidities include: HTN, myocardial infarction, diverticulitis, IBS, Type 2 DM with most recent Hgb A1C= 5.9% on 02/24/18, intertrigo, anemia, anxiety, depression, hyperlipidemia, insomnia, seasonal allergies, Vit D deficiency. She was discharged to home on 10/30/18 without the need for home health services or DME.   Assessment:  Patient voices good understanding of all discharge instructions.  See transition of care flowsheet for assessment details.   Plan:  Reviewed hospital discharge diagnosis of bowel perforation and abscess and treatment plan using hospital discharge  instructions, assessing medication adherence, reviewing problems requiring provider notification, and discussing the importance of follow up with surgeon, primary care provider and specialists as directed. Reviewed [place]'s announcements that all [facility] members will receive the Healthy Lifestyle Premium rate in 2021.  Per patient request, will e-mail the contact number for [employer] to her personal e-mail address so that she can file her hospital indemnity claim.   No ongoing care management needs identified so will close case to [facility] services and route successful outreach letter with [place] [facility] Management pamphlet and 24 Hour Nurse Line Magnet to [facility] clinical pool to be mailed to patient's home address.    Barrington Ellison RN,CCM,CDE [place] Management Coordinator Office Phone [phone] Office Fax [phone]

## 2018-10-31 NOTE — Telephone Encounter (Signed)
Transition Care Management Follow-up Telephone Call   Date discharged? 10/30/18   How have you been since you were released from the hospital? "Doing okay"   Do you understand why you were in the hospital? yes   Do you understand the discharge instructions? yes   Where were you discharged to? Home.   Items Reviewed:  Medications reviewed: "They just added abx"  Allergies reviewed: "no new allergies"  Dietary changes reviewed: High fiber diet  Referrals reviewed: yes   Functional Questionnaire:   Activities of Daily Living (ADLs):   She states they are independent in the following: ambulation, bathing and hygiene, feeding, continence, grooming, toileting and dressing States they require assistance with the following: na   Any transportation issues/concerns?: no   Any patient concerns? no   Confirmed importance and date/time of follow-up visits scheduled yes  Provider Appointment booked with PCP 11/04/18  Confirmed with patient if condition begins to worsen call PCP or go to the ER.  Patient was given the office number and encouraged to call back with question or concerns.  : yes

## 2018-11-04 ENCOUNTER — Encounter: Payer: Self-pay | Admitting: Family

## 2018-11-04 ENCOUNTER — Ambulatory Visit: Payer: 59 | Admitting: Family

## 2018-11-04 ENCOUNTER — Other Ambulatory Visit: Payer: Self-pay

## 2018-11-04 VITALS — BP 128/60 | HR 63 | Temp 96.9°F | Resp 16 | Ht 64.0 in | Wt 156.6 lb

## 2018-11-04 DIAGNOSIS — R945 Abnormal results of liver function studies: Secondary | ICD-10-CM

## 2018-11-04 DIAGNOSIS — I10 Essential (primary) hypertension: Secondary | ICD-10-CM | POA: Diagnosis not present

## 2018-11-04 DIAGNOSIS — K5792 Diverticulitis of intestine, part unspecified, without perforation or abscess without bleeding: Secondary | ICD-10-CM

## 2018-11-04 DIAGNOSIS — R7989 Other specified abnormal findings of blood chemistry: Secondary | ICD-10-CM

## 2018-11-04 LAB — COMPREHENSIVE METABOLIC PANEL
ALT: 41 U/L — ABNORMAL HIGH (ref 0–35)
AST: 16 U/L (ref 0–37)
Albumin: 4.2 g/dL (ref 3.5–5.2)
Alkaline Phosphatase: 103 U/L (ref 39–117)
BUN: 9 mg/dL (ref 6–23)
CO2: 25 mEq/L (ref 19–32)
Calcium: 9.9 mg/dL (ref 8.4–10.5)
Chloride: 105 mEq/L (ref 96–112)
Creatinine, Ser: 0.97 mg/dL (ref 0.40–1.20)
GFR: 70.76 mL/min (ref >60.00)
Glucose, Bld: 85 mg/dL (ref 70–99)
Potassium: 3.9 mEq/L (ref 3.5–5.1)
Sodium: 139 mEq/L (ref 135–145)
Total Bilirubin: 0.4 mg/dL (ref 0.2–1.2)
Total Protein: 7.8 g/dL (ref 6.0–8.3)

## 2018-11-04 LAB — CBC WITH DIFFERENTIAL/PLATELET
Basophils Absolute: 0.1 10*3/uL (ref 0.0–0.1)
Basophils Relative: 1 % (ref 0.0–3.0)
Eosinophils Absolute: 0.1 10*3/uL (ref 0.0–0.7)
Eosinophils Relative: 1.6 % (ref 0.0–5.0)
HCT: 36.7 % (ref 36.0–46.0)
Hemoglobin: 12.1 g/dL (ref 12.0–15.0)
Lymphocytes Relative: 37.6 % (ref 12.0–46.0)
Lymphs Abs: 3 10*3/uL (ref 0.7–4.0)
MCHC: 33 g/dL (ref 30.0–36.0)
MCV: 94.3 fl (ref 78.0–100.0)
Monocytes Absolute: 0.8 10*3/uL (ref 0.1–1.0)
Monocytes Relative: 10.3 % (ref 3.0–12.0)
Neutro Abs: 3.9 10*3/uL (ref 1.4–7.7)
Neutrophils Relative %: 49.5 % (ref 43.0–77.0)
Platelets: 415 10*3/uL — ABNORMAL HIGH (ref 150.0–400.0)
RBC: 3.9 Mil/uL (ref 3.87–5.11)
RDW: 13.6 % (ref 11.5–15.5)
WBC: 7.9 10*3/uL (ref 4.0–10.5)

## 2018-11-04 NOTE — Progress Notes (Signed)
Subjective:    Patient ID: Kaitlyn Harper, female    DOB: 24-Apr-1960, 58 y.o.   MRN: [id number]  HPI   Patient is a 58 year old female who presents today for hospital follow-up.  Hospital discharge summary is reviewed.  She was admitted on 10/20/2018 and was diagnosed with diverticulitis of the large intestine with perforation and abscess.  Surgical consult was obtained.  They recommended drain placement, however interventional radiology did not feel that drain placement was safe given the location.  Therefore the patient was treated with IV antibiotics and was followed with serial CT.  Follow-up CT was improved and patient was ultimately discharged home on 10/30/2018.  Since returning home she reports feeling well.  She is tolerating p.o.'s without difficulty.  She denies fever.  She reports that her stools remain loose since her discharge.  She was treated for a yeast infection while hospitalized with Diflucan and she reports resolution of the symptoms.  Hypokalemia-this was noted during her hospitalization and was repleted.  History of tobacco abuse-patient reports that she remains smoke-free.  Elevated LFTs.  Her AST was 81 and ALT 108 prior to discharge.  Was felt that this was most likely secondary to acute diverticulitis.   She has an appointment with the surgeon on 9/29. Denies fever. Denies abdominal pain. Tolerating PO's.   Review of Systems See HPI  Past Medical History:  Diagnosis Date  . Anemia   . Anxiety   . Depression   . Diabetes mellitus without complication ([place])    type 2-diet controlled  . Hyperglycemia   . Hyperlipidemia   . Hypertension   . Syncope 03/18/2016     Social History   Socioeconomic History  . Marital status: Divorced    Spouse name: Not on file  . Number of children: 3  . Years of education: Not on file  . Highest education level: Not on file  Occupational History  . Occupation: Water quality scientist: [employer]  Social  Needs  . Financial resource strain: Not on file  . Food insecurity    Worry: Not on file    Inability: Not on file  . Transportation needs    Medical: Not on file    Non-medical: Not on file  Tobacco Use  . Smoking status: Former Smoker    Packs/day: 0.25    Types: Cigarettes    Quit date: 07/02/2015    Years since quitting: 3.3  . Smokeless tobacco: Never Used  . Tobacco comment: 10 cigarettes daily  Substance and Sexual Activity  . Alcohol use: Yes    Alcohol/week: 0.0 standard drinks    Comment: socially  . Drug use: No  . Sexual activity: Yes    Birth control/protection: Post-menopausal  Lifestyle  . Physical activity    Days per week: Not on file    Minutes per session: Not on file  . Stress: Not on file  Relationships  . Social Herbalist on phone: Not on file    Gets together: Not on file    Attends religious service: Not on file    Active member of club or organization: Not on file    Attends meetings of clubs or organizations: Not on file    Relationship status: Not on file  . Intimate partner violence    Fear of current or ex partner: Not on file    Emotionally abused: Not on file    Physically abused: Not on  file    Forced sexual activity: Not on file  Other Topics Concern  . Not on file  Social History Narrative   Caffeine use:  2 drinks   Regular exercise: no   Lives with her daughter   3 children    2 grandchildren   1 great grandson    Past Surgical History:  Procedure Laterality Date  . APPENDECTOMY  1985  . CHOLECYSTECTOMY  1985  . COLONOSCOPY W/ POLYPECTOMY  03/01/2015   Dr Oretha Caprice, [facility].  +adenomatous polyps  . COLONOSCOPY W/ POLYPECTOMY  02/28/2011   4 polyps - 3 TA  . DILATION AND CURETTAGE OF UTERUS  2005  . [place]  2002  . KNEE SURGERY  1983   arthroscopy?  [name] LEFT HEART CATH AND CORONARY ANGIOGRAPHY N/A 03/19/2016   Procedure: Left Heart Cath and Coronary Angiography;  Surgeon: Peter M Martinique, MD;   Location: [facility];  Service: Cardiovascular;  Laterality: N/A;  . MOUTH SURGERY  03/08/2016  . POLYPECTOMY  2006   ?anal polyp?  Kathee Polite DUCT PROBING  2004  . TUBAL LIGATION  1985    Family History  Problem Relation Age of Onset  . Hypertension Father   . Sarcoidosis Brother   . Alcohol abuse Brother   . Cirrhosis Brother   . Stroke Maternal Grandmother   . Colon cancer Neg Hx   . Colon polyps Neg Hx   . Esophageal cancer Neg Hx   . Rectal cancer Neg Hx   . Stomach cancer Neg Hx   . Pulmonary embolism Neg Hx     Allergies  Allergen Reactions  . Sulfonamide Derivatives Hives and Other (See Comments)    Light sensitivity    Current Outpatient Medications on File Prior to Visit  Medication Sig Dispense Refill  . acetaminophen (TYLENOL) 325 MG tablet Take 2 tablets (650 mg total) by mouth every 6 (six) hours as needed for mild pain (or Fever >/= 101).    [name] albuterol (PROVENTIL HFA;VENTOLIN HFA) 108 (90 Base) MCG/ACT inhaler Inhale 2 puffs into the lungs every 6 (six) hours as needed for wheezing or shortness of breath. 1 Inhaler 2  . amLODipine-valsartan (EXFORGE) 10-320 MG tablet TAKE 1 TABLET BY MOUTH ONCE DAILY (Patient taking differently: Take 1 tablet by mouth daily. ) 90 tablet 1  . amoxicillin-clavulanate (AUGMENTIN) 875-125 MG tablet Take 1 tablet by mouth 2 (two) times daily for 14 days. 28 tablet 0  . ARIPiprazole (ABILIFY) 10 MG tablet Take 10 mg by mouth daily after breakfast.   2  . aspirin EC 81 MG tablet Take 81 mg by mouth daily.    [name] atorvastatin (LIPITOR) 20 MG tablet TAKE 1 TABLET BY MOUTH ONCE DAILY (Patient taking differently: Take 20 mg by mouth daily after breakfast. ) 90 tablet 1  . carvedilol (COREG) 6.25 MG tablet TAKE 1 TABLET BY MOUTH TWICE DAILY WITH A MEAL (Patient taking differently: Take 6.25 mg by mouth 2 (two) times daily with a meal. ) 180 tablet 1  . lamoTRIgine (LAMICTAL) 100 MG tablet Take 100 mg by mouth 2 (two) times daily.   0  .  ondansetron (ZOFRAN ODT) 4 MG disintegrating tablet Take 1 tablet (4 mg total) by mouth every 8 (eight) hours as needed for nausea or vomiting. 20 tablet 0  . saccharomyces boulardii (FLORASTOR) 250 MG capsule Take 1 capsule (250 mg total) by mouth 2 (two) times daily. You can find a probiotic over the counter.    [name]  traZODone (DESYREL) 50 MG tablet Take 100 mg by mouth at bedtime.   2  . triamcinolone ointment (KENALOG) 0.1 % Apply 1 application topically 2 (two) times daily as needed (rash).     . [DISCONTINUED] amLODipine-valsartan (EXFORGE) 10-320 MG tablet TAKE 1 TABLET DAILY BY MOUTH. 90 tablet 1  . [DISCONTINUED] carvedilol (COREG) 6.25 MG tablet TAKE 1 TABLET BY MOUTH TWICE DAILY WITH A MEAL 180 tablet 1   No current facility-administered medications on file prior to visit.     BP 128/60   Pulse 63   Temp (!) 96.9 F (36.1 C) (Temporal)   Resp 16   Ht 5\' 4"  (1.626 m)   Wt 156 lb 9.6 oz (71 kg)   LMP 12/25/2008   SpO2 100%   BMI 26.88 kg/m       Objective:   Physical Exam Constitutional:      Appearance: She is well-developed.  Neck:     Musculoskeletal: Neck supple.     Thyroid: No thyromegaly.  Cardiovascular:     Rate and Rhythm: Normal rate and regular rhythm.     Heart sounds: Normal heart sounds. No murmur.  Pulmonary:     Effort: Pulmonary effort is normal. No respiratory distress.     Breath sounds: Normal breath sounds. No wheezing.  Abdominal:     General: Bowel sounds are normal.     Palpations: Abdomen is soft.     Comments: Mild suprapubic tenderness to palpation without guarding   Skin:    General: Skin is warm and dry.  Neurological:     Mental Status: She is alert and oriented to person, place, and time.  Psychiatric:        Behavior: Behavior normal.        Thought Content: Thought content normal.        Judgment: Judgment normal.           Assessment & Plan:  Diverticulitis- clinically improving. Will obtain follow up CBC to assess  white count. Pt is advised to keep her upcoming appointment with the surgeon.   Abnormal LFTs-will obtain follow-up liver function testing.  Hypertension-blood pressure stable on current medications.  Continue same.

## 2018-11-04 NOTE — Patient Instructions (Signed)
Please complete lab work prior to leaving. Keep your upcoming appointment with the surgeon.

## 2018-11-05 ENCOUNTER — Other Ambulatory Visit: Payer: Self-pay | Admitting: Surgery

## 2018-11-05 DIAGNOSIS — K5780 Diverticulitis of intestine, part unspecified, with perforation and abscess without bleeding: Secondary | ICD-10-CM

## 2018-11-06 ENCOUNTER — Ambulatory Visit
Admission: RE | Admit: 2018-11-06 | Discharge: 2018-11-06 | Disposition: A | Discharge status: Home / Self Care | Payer: 59 | Source: Ambulatory Visit | Attending: Surgery | Admitting: Surgery

## 2018-11-06 DIAGNOSIS — K5780 Diverticulitis of intestine, part unspecified, with perforation and abscess without bleeding: Secondary | ICD-10-CM

## 2018-11-06 MED ORDER — IOPAMIDOL (ISOVUE-300) INJECTION 61%
100.0000 mL | Freq: Once | INTRAVENOUS | Status: AC | PRN
  Administered 2018-11-06: 10:00:00 100 mL via INTRAVENOUS

## 2018-11-11 ENCOUNTER — Ambulatory Visit: Payer: Self-pay | Admitting: Surgery

## 2018-11-11 DIAGNOSIS — Z86010 Personal history of colonic polyps: Secondary | ICD-10-CM | POA: Diagnosis not present

## 2018-11-11 DIAGNOSIS — K5720 Diverticulitis of large intestine with perforation and abscess without bleeding: Secondary | ICD-10-CM | POA: Diagnosis not present

## 2018-11-11 MED FILL — METRONIDAZOLE 500 MG TABS: 500 | 1 days supply | Qty: 6 | Fill #0

## 2018-11-11 MED FILL — NEOMYCIN 500 MG TABLET: 500 | 1 days supply | Qty: 6 | Fill #0

## 2018-11-11 NOTE — H&P (Signed)
[name] Documented: 11/11/2018 9:38 AM Location: [facility] Patient #: [id number] DOB: March 20, 1960 Single / Language: [name] / Race: Black or African American Female  History of Present Illness Adin Hector MD; 11/11/2018 10:46 AM) The patient is a 58 year old female who presents with diverticulitis. Note for "Diverticulitis": ` ` ` Patient sent for surgical consultation  Chief Complaint: Follow-up on diverticulitis ` ` The patient is a woman admitted 9/7 for diverticulitis with a 7 cm abscess. Placed on antibiotics. Not amenable to percutaneous drainage. Clinically improved and was discharged 10 days later. Follow-up CAT scan on 9/24 and noted to persistent fluid collections. Largest 5 cm. Other 3 cm. Was feeling better at her primary care office visit last week. We'll finish up on her Augmentin antibiotics in the next few days. Here for follow-up. She has a history of colon polyps. Last colonoscopy 2017 showed tubular adenomas by Dr. Oretha Caprice. Her appetite is close to normal. She has been focusing on mostly soft foods. Moving bowels every day. Not fully formed but definitely improved. Energy level coming back. Denies much pain. No fevers or chills. She does have a history of syncope and crushable AV block. Underwent catheterization 2 years ago that was quite underwhelming overall. Stable on Coreg. Not on blood thinners. She does not smoke. Some question of borderline diabetes but her A1c is been 5.5-6 in the past 5 years. He is not on any diabetic medication.   No personal nor family history of GI/colon cancer, inflammatory bowel disease, irritable bowel syndrome, allergy such as Celiac Sprue, dietary/dairy problems, colitis, ulcers nor gastritis. No recent sick contacts/gastroenteritis. No travel outside the country. No changes in diet. No dysphagia to solids or liquids. No significant heartburn or reflux. No hematochezia, hematemesis,  coffee ground emesis. No evidence of prior gastric/peptic ulceration.  (Review of systems as stated in this history (HPI) or in the review of systems. Otherwise all other 12 point ROS are negative) ` ` `   Past Surgical History (April Staton, CMA; 11/11/2018 9:38 AM) Appendectomy Breast Biopsy Right. Colon Polyp Removal - Colonoscopy Hemorrhoidectomy  Diagnostic Studies History (April Staton, [place]; 11/11/2018 9:38 AM) Colonoscopy 1-5 years ago Pap Smear 1-5 years ago  Allergies (April Staton, [place]; 11/11/2018 9:40 AM) Sulfacetamide *CHEMICALS*  Medication History (April Staton, CMA; 11/11/2018 9:43 AM) Tylenol (Oral) Specific strength unknown - Active. Albuterol Sulfate HFA (108 (90 Base)MCG/ACT Aerosol Soln, Inhalation) Active. amLODIPine Besylate-Valsartan (10-320MG  Tablet, Oral) Active. Amoxicillin-Pot Clavulanate (875-125MG  Tablet, Oral) Active. ARIPiprazole (10MG  Tablet, Oral) Active. Aspirin (81MG  Tablet, Oral) Active. Atorvastatin Calcium (20MG  Tablet, Oral) Active. Carvedilol (6.25MG  Tablet, Oral) Active. lamoTRIgine (100MG  Tablet, Oral) Active. Ondansetron (4MG  Tablet Disint, Oral) Active. Saccharomyces boulardii (250MG  Capsule, Oral) Active. traZODone HCl (50MG  Tablet, Oral) Active. Triamcinolone Acetonide (External) Specific strength unknown - Active. Medications Reconciled  Social History (April Staton, CMA; 11/11/2018 9:38 AM) Alcohol use Occasional alcohol use. Illicit drug use Recently quit drug use. Tobacco use Current some day smoker.  Family History (April Staton, [place]; 11/11/2018 9:38 AM) Alcohol Abuse Brother, Father. Hypertension Brother, Father.  Pregnancy / Birth History (April Staton, [place]; 11/11/2018 9:38 AM) Age at menarche 11 years. Age of menopause 42-50 Gravida 3 Irregular periods Maternal age 49-20 Para 3  Other Problems (April Staton, CMA; 11/11/2018 9:38 AM) Diverticulosis High blood pressure      Review of Systems (April Staton CMA; 11/11/2018 9:38 AM) General Present- Weight Loss. Not Present- Appetite Loss, Chills, Fatigue, Fever, Night Sweats and Weight Gain. Skin Not  Present- Change in Wart/Mole, Dryness, Hives, Jaundice, New Lesions, Non-Healing Wounds, Rash and Ulcer. HEENT Present- Wears glasses/contact lenses. Not Present- Earache, Hearing Loss, Hoarseness, Nose Bleed, Oral Ulcers, Ringing in the Ears, Seasonal Allergies, Sinus Pain, Sore Throat, Visual Disturbances and Yellow Eyes. Breast Not Present- Breast Mass, Breast Pain, Nipple Discharge and Skin Changes. Cardiovascular Not Present- Chest Pain, Difficulty Breathing Lying Down, Leg Cramps, Palpitations, Rapid Heart Rate, Shortness of Breath and Swelling of Extremities. Gastrointestinal Present- Change in Bowel Habits. Not Present- Abdominal Pain, Bloating, Bloody Stool, Chronic diarrhea, Constipation, Difficulty Swallowing, Excessive gas, Gets full quickly at meals, Hemorrhoids, Indigestion, Nausea, Rectal Pain and Vomiting. Female Genitourinary Not Present- Frequency, Nocturia, Painful Urination, Pelvic Pain and Urgency.  Vitals (April Staton CMA; 11/11/2018 9:43 AM) 11/11/2018 9:43 AM Weight: 157.13 lb Height: 64in Body Surface Area: 1.77 m Body Mass Index: 26.97 kg/m  Temp.: 97.4F(Oral)  Pulse: 83 (Regular)  P.OX: 99% (Room air) BP: 148/80 (Sitting, Left Arm, Standard)        Physical Exam Adin Hector MD; 11/11/2018 10:04 AM)  General Mental Status-Alert. General Appearance-Not in acute distress, Not Sickly. Orientation-Oriented X3. Hydration-Well hydrated. Voice-Normal.  Integumentary Global Assessment Upon inspection and palpation of skin surfaces of the - Axillae: non-tender, no inflammation or ulceration, no drainage. and Distribution of scalp and body hair is normal. General Characteristics Temperature - normal warmth is noted.  Head and Neck Head-normocephalic,  atraumatic with no lesions or palpable masses. Face Global Assessment - atraumatic, no absence of expression. Neck Global Assessment - no abnormal movements, no bruit auscultated on the right, no bruit auscultated on the left, no decreased range of motion, non-tender. Trachea-midline. Thyroid Gland Characteristics - non-tender.  Eye Eyeball - Left-Extraocular movements intact, No Nystagmus - Left. Eyeball - Right-Extraocular movements intact, No Nystagmus - Right. Cornea - Left-No Hazy - Left. Cornea - Right-No Hazy - Right. Sclera/Conjunctiva - Left-No scleral icterus, No Discharge - Left. Sclera/Conjunctiva - Right-No scleral icterus, No Discharge - Right. Pupil - Left-Direct reaction to light normal. Pupil - Right-Direct reaction to light normal.  ENMT Ears Pinna - Left - no drainage observed, no generalized tenderness observed. Pinna - Right - no drainage observed, no generalized tenderness observed. Nose and Sinuses External Inspection of the Nose - no destructive lesion observed. Inspection of the nares - Left - quiet respiration. Inspection of the nares - Right - quiet respiration. Mouth and Throat Lips - Upper Lip - no fissures observed, no pallor noted. Lower Lip - no fissures observed, no pallor noted. Nasopharynx - no discharge present. Oral Cavity/Oropharynx - Tongue - no dryness observed. Oral Mucosa - no cyanosis observed. Hypopharynx - no evidence of airway distress observed.  Chest and Lung Exam Inspection Movements - Normal and Symmetrical. Accessory muscles - No use of accessory muscles in breathing. Palpation Palpation of the chest reveals - Non-tender. Auscultation Breath sounds - Normal and Clear.  Cardiovascular Auscultation Rhythm - Regular. Murmurs & Other Heart Sounds - Auscultation of the heart reveals - No Murmurs and No Systolic Clicks.  Abdomen Inspection Inspection of the abdomen reveals - No Visible peristalsis and No Abnormal  pulsations. Umbilicus - No Bleeding, No Urine drainage. Palpation/Percussion Palpation and Percussion of the abdomen reveal - Soft, Non Tender, No Rebound tenderness, No Rigidity (guarding) and No Cutaneous hyperesthesia. Note: Abdomen soft. Not severely distended. No distasis recti. No umbilical or other anterior abdominal wall hernias  Female Genitourinary Sexual Maturity Tanner 5 - Adult hair pattern. Note: No vaginal bleeding nor discharge  Peripheral Vascular Upper Extremity Inspection - Left - No Cyanotic nailbeds - Left, Not Ischemic. Inspection - Right - No Cyanotic nailbeds - Right, Not Ischemic.  Neurologic Neurologic evaluation reveals -normal attention span and ability to concentrate, able to name objects and repeat phrases. Appropriate fund of knowledge , normal sensation and normal coordination. Mental Status Affect - not angry, not paranoid. Cranial Nerves-Normal Bilaterally. Gait-Normal.  Neuropsychiatric Mental status exam performed with findings of-able to articulate well with normal speech/language, rate, volume and coherence, thought content normal with ability to perform basic computations and apply abstract reasoning and no evidence of hallucinations, delusions, obsessions or homicidal/suicidal ideation.  Musculoskeletal Global Assessment Spine, Ribs and Pelvis - no instability, subluxation or laxity. Right Upper Extremity - no instability, subluxation or laxity.  Lymphatic Head & Neck  General Head & Neck Lymphatics: Bilateral - Description - No Localized lymphadenopathy. Axillary  General Axillary Region: Bilateral - Description - No Localized lymphadenopathy. Femoral & Inguinal  Generalized Femoral & Inguinal Lymphatics: Left - Description - No Localized lymphadenopathy. Right - Description - No Localized lymphadenopathy.    Assessment & Plan Adin Hector MD; 11/11/2018 10:43 AM)  DIVERTICULITIS OF LARGE INTESTINE WITH ABSCESS  WITHOUT BLEEDING (K57.20) Impression: Pleasant woman with sigmoid abscess presumed to be diverticulitis.  I think she would benefit from robotic sigmoid colectomy since this is a complicated attack with large abscess. She is leaning toward surgery as well. Ideally would wait 6 weeks from discharge to allow the abscess to be has minimal as possible and minimize the risk of need for colostomy. First week of November  Complete oral Augmentin antibiotic since her abscess and smaller and she is asymptomatic now. If she has recurring symptoms of pain or bowel changes or discomfort, placed back on antibiotics until she gets surgery.  She does have a history of colon polyps. Last colonoscopy 2017. I sent a message to her gastroenterologist, Dr. Oretha Caprice, to see if he feels like she needs a repeat colonoscopy prior to surgery or can keep the five-year follow-up as initially recommended (2022).   PREOP COLON - ENCOUNTER FOR PREOPERATIVE EXAMINATION FOR GENERAL SURGICAL PROCEDURE (Z01.818)  Current Plans You are being scheduled for surgery- Our schedulers will call you.  You should hear from our office's scheduling department within 5 working days about the location, date, and time of surgery. We try to make accommodations for patient's preferences in scheduling surgery, but sometimes the OR schedule or the surgeon's schedule prevents [place] from making those accommodations.  If you have not heard from our office [phone] in 5 working days, call the office and ask for your surgeon's nurse.  If you have other questions about your diagnosis, plan, or surgery, call the office and ask for your surgeon's nurse.  Written instructions provided The anatomy & physiology of the digestive tract was discussed. The pathophysiology of the colon was discussed. Natural history risks without surgery was discussed. I feel the risks of no intervention will lead to serious problems that outweigh the operative  risks; therefore, I recommended a partial colectomy to remove the pathology. Minimally invasive (Robotic/Laparoscopic) & open techniques were discussed.  Risks such as bleeding, infection, abscess, leak, reoperation, possible ostomy, hernia, heart attack, death, and other risks were discussed. I noted a good likelihood this will help address the problem. Goals of post-operative recovery were discussed as well. Need for adequate nutrition, daily bowel regimen and healthy physical activity, to optimize recovery was noted as well. We will work to minimize  complications. Educational materials were available as well. Questions were answered. The patient expresses understanding & wishes to proceed with surgery.  Pt Education - [facility] Colon Bowel Prep 2018 ERAS/Miralax/Antibiotics Started Neomycin Sulfate 500 MG Oral Tablet, 2 (two) Tablet SEE NOTE, #6, 11/11/2018, No Refill. Local Order: Pharmacist Notes: TAKE TWO TABLETS AT 2 PM, 3 PM, AND 10 PM THE DAY PRIOR TO SURGERY Started Flagyl 500 MG Oral Tablet, 2 (two) Tablet SEE NOTE, #6, 11/11/2018, No Refill. Local Order: Pharmacist Notes: Take at 2pm, 3pm, and 10pm the day prior to your colon operation Pt Education - Pamphlet Given - Laparoscopic Colorectal Surgery: discussed with patient and provided information. Pt Education - [facility] Colectomy post-op instructions: discussed with patient and provided information.  HISTORY OF ADENOMATOUS POLYP OF COLON (Z86.010) Impression: She does have a history of colon polyps. Last colonoscopy 2017. I sent a message to her gastroenterologist, Dr. Oretha Caprice, to see if he feels like she needs a repeat colonoscopy prior to surgery or can keep the five-year follow-up as initially recommended (2022).  Current Plans Pt Education - Polyps in the Colon and Rectum (Colonic and Rectal Polyps): colonic polyps  Adin Hector, MD, FACS, MASCRS Gastrointestinal and Minimally Invasive Surgery    [address] [place], [place] [address] [phone] Main / Paging [phone] Fax

## 2018-11-20 ENCOUNTER — Other Ambulatory Visit: Payer: Self-pay | Admitting: *Deleted

## 2018-11-20 NOTE — Patient Outreach (Signed)
[facility]) Care Management  11/20/2018  Kaitlyn Harper Sep 02, 1960 [id number]  Monthly telephonic [facility]MR case management review with [facility] and Dr. Alonza Bogus. Update provided to include:Transition of care call completed 9/18. Patient saw PCP 9/22, 9/24 -CT of abd and pelvis showed persistent but stable bilateral pelvic abscesses, for surgery on 11/4- robotic assisted rectosigmoid resection.  Barrington Ellison RN,CCM,CDE [place] Management Coordinator Office Phone [phone] Office Fax [phone]

## 2018-11-24 MED FILL — ATORVASTATIN 20 MG TABLET: 20 | 90 days supply | Qty: 90 | Fill #1

## 2018-11-24 MED FILL — NEOMYCIN 500 MG TABLET: 500 | 1 days supply | Qty: 6 | Fill #0

## 2018-11-24 MED FILL — METRONIDAZOLE 500 MG TABS: 500 | 1 days supply | Qty: 6 | Fill #0

## 2018-12-11 NOTE — Patient Instructions (Addendum)
DUE TO COVID-19 ONLY ONE VISITOR IS ALLOWED TO COME WITH YOU AND STAY IN THE WAITING ROOM ONLY DURING PRE OP AND PROCEDURE DAY OF SURGERY. THE 1 VISITOR MAY VISIT WITH YOU AFTER SURGERY IN YOUR PRIVATE ROOM DURING VISITING HOURS ONLY!  YOU NEED TO HAVE A COVID 19 TEST ON___10- 30___ @___10 :00AM ____, THIS TEST MUST BE DONE BEFORE SURGERY, COME  [place], [place] , [id number].  ([place]) ONCE YOUR COVID TEST IS COMPLETED, PLEASE BEGIN THE QUARANTINE INSTRUCTIONS AS OUTLINED IN YOUR HANDOUT.                Kaitlyn Harper    Your procedure is scheduled on: 12-17-18   Report to [facility] Main  Entrance     Report to admitting at 10:30 AM    Call this number if you have problems the morning of surgery [place].    Remember: DRINK 2 PRESURGERY ENSURE DRINKS THE NIGHT BEFORE SURGERY AT 10:00 PM AND 1 PRESURGERY DRINK THE DAY OF THE PROCEDURE 3 HOURS PRIOR TO SCHEDULED SURGERY. NO SOLIDS AFTER MIDNIGHT THE DAY PRIOR TO THE SURGERY. NOTHING BY MOUTH EXCEPT CLEAR LIQUIDS UNTIL THREE HOURS PRIOR TO SCHEDULED SURGERY. PLEASE FINISH LAST PRESURGERY ENSURE DRINK PER SURGEON ORDER 3 HOURS PRIOR TO SCHEDULED SURGERY TIME WHICH NEEDS TO BE COMPLETED AT 9:30 AM.    CLEAR LIQUID DIET   Foods Allowed                                                                     Foods Excluded  Coffee and tea, regular and decaf                             liquids that you cannot  Plain Jell-O any favor except red or purple                                           see through such as: Fruit ices (not with fruit pulp)                                     milk, soups, orange juice  Iced Popsicles                                    All solid food Carbonated beverages, regular and diet                                    Cranberry, grape and apple juices Sports drinks like Gatorade Lightly seasoned  clear broth or consume(fat free) Sugar, honey syrup  Sample Menu Breakfast  Lunch                                     Supper Cranberry juice                    Beef broth                            Chicken broth Jell-O                                     Grape juice                           Apple juice Coffee or tea                        Jell-O                                      Popsicle                                                Coffee or tea                        Coffee or tea  _____________________________________________________________________       Take these medicines the morning of surgery with A SIP OF WATER: Ariprazole (Abilify ), Atorvastatin (Lipitor), Carvedilol (Coreg) and Lamotrigine (Lamictal)    BRUSH YOUR TEETH MORNING OF SURGERY AND RINSE YOUR MOUTH OUT, NO CHEWING GUM CANDY OR MINTS.    How to Manage Your Diabetes Before and After Surgery  Why is it important to control my blood sugar before and after surgery? . Improving blood sugar levels before and after surgery helps healing and can limit problems. . A way of improving blood sugar control is eating a healthy diet by: o  Eating less sugar and carbohydrates o  Increasing activity/exercise o  Talking with your doctor about reaching your blood sugar goals . High blood sugars (greater than 180 mg/dL) can raise your risk of infections and slow your recovery, so you will need to focus on controlling your diabetes during the weeks before surgery. . Make sure that the doctor who takes care of your diabetes knows about your planned surgery including the date and location.  How do I manage my blood sugar before surgery? . Check your blood sugar at least 4 times a day, starting 2 days before surgery, to make sure that the level is not too high or low. o Check your blood sugar the morning of your surgery when you wake up and every 2 hours until you get to the Short Stay unit. . If  your blood sugar is less than 70 mg/dL, you will need to treat for low blood sugar: o Do not take insulin. o Treat a low blood sugar (less than 70 mg/dL) with  cup of clear juice (cranberry or apple), 4 glucose tablets, OR glucose gel. o Recheck blood sugar in  15 minutes after treatment (to make sure it is greater than 70 mg/dL). If your blood sugar is not greater than 70 mg/dL on recheck, call [phone] for further instructions. . Report your blood sugar to the short stay nurse when you get to Short Stay.  . If you are admitted to the hospital after surgery: o Your blood sugar will be checked by the staff and you will probably be given insulin after surgery (instead of oral diabetes medicines) to make sure you have good blood sugar levels. o The goal for blood sugar control after surgery is 80-180 mg/dL.    Reviewed and Endorsed by [facility] Patient Education Committee, August 2015                                You may not have any metal on your body including hair pins and              piercings  Do not wear jewelry, make-up, lotions, powders or perfumes, deodorant             Do not wear nail polish on your fingernails.  Do not shave  48 hours prior to surgery.     Do not bring valuables to the hospital. [place].  Contacts, dentures or bridgework may not be worn into surgery.  Leave suitcase in the car. After surgery it may be brought to your room.     Special Instructions: N/A              Please read over the following fact sheets you were given: _____________________________________________________________________             [facility] - Preparing for Surgery Before surgery, you can play an important role.  Because skin is not sterile, your skin needs to be as free of germs as possible.  You can reduce the number of germs on your skin by washing with CHG (chlorahexidine gluconate) soap before surgery.  CHG is an  antiseptic cleaner which kills germs and bonds with the skin to continue killing germs even after washing. Please DO NOT use if you have an allergy to CHG or antibacterial soaps.  If your skin becomes reddened/irritated stop using the CHG and inform your nurse when you arrive at Short Stay. Do not shave (including legs and underarms) for at least 48 hours prior to the first CHG shower.  You may shave your face/neck. Please follow these instructions carefully:  1.  Shower with CHG Soap the night before surgery and the  morning of Surgery.  2.  If you choose to wash your hair, wash your hair first as usual with your  normal  shampoo.  3.  After you shampoo, rinse your hair and body thoroughly to remove the  shampoo.                           4.  Use CHG as you would any other liquid soap.  You can apply chg directly  to the skin and wash                       Gently with a scrungie or clean washcloth.  5.  Apply the CHG Soap to your body ONLY FROM THE NECK  DOWN.   Do not use on face/ open                           Wound or open sores. Avoid contact with eyes, ears mouth and genitals (private parts).                       Wash face,  Genitals (private parts) with your normal soap.             6.  Wash thoroughly, paying special attention to the area where your surgery  will be performed.  7.  Thoroughly rinse your body with warm water from the neck down.  8.  DO NOT shower/wash with your normal soap after using and rinsing off  the CHG Soap.                9.  Pat yourself dry with a clean towel.            10.  Wear clean pajamas.            11.  Place clean sheets on your bed the night of your first shower and do not  sleep with pets. Day of Surgery : Do not apply any lotions/deodorants the morning of surgery.  Please wear clean clothes to the hospital/surgery center.  FAILURE TO FOLLOW THESE INSTRUCTIONS MAY RESULT IN THE CANCELLATION OF YOUR SURGERY PATIENT  SIGNATURE_________________________________  NURSE SIGNATURE__________________________________  ________________________________________________________________________    Kaitlyn Harper  An incentive spirometer is a tool that can help keep your lungs clear and active. This tool measures how well you are filling your lungs with each breath. Taking long deep breaths may help reverse or decrease the chance of developing breathing (pulmonary) problems (especially infection) following:  A long period of time when you are unable to move or be active. BEFORE THE PROCEDURE   If the spirometer includes an indicator to show your best effort, your nurse or respiratory therapist will set it to a desired goal.  If possible, sit up straight or lean slightly forward. Try not to slouch.  Hold the incentive spirometer in an upright position. INSTRUCTIONS FOR USE  1. Sit on the edge of your bed if possible, or sit up as far as you can in bed or on a chair. 2. Hold the incentive spirometer in an upright position. 3. Breathe out normally. 4. Place the mouthpiece in your mouth and seal your lips tightly around it. 5. Breathe in slowly and as deeply as possible, raising the piston or the ball toward the top of the column. 6. Hold your breath for 3-5 seconds or for as long as possible. Allow the piston or ball to fall to the bottom of the column. 7. Remove the mouthpiece from your mouth and breathe out normally. 8. Rest for a few seconds and repeat Steps 1 through 7 at least 10 times every 1-2 hours when you are awake. Take your time and take a few normal breaths between deep breaths. 9. The spirometer may include an indicator to show your best effort. Use the indicator as a goal to work toward during each repetition. 10. After each set of 10 deep breaths, practice coughing to be sure your lungs are clear. If you have an incision (the cut made at the time of surgery), support your incision when coughing  by placing a pillow or rolled up towels firmly against it. Once you are  able to get out of bed, walk around indoors and cough well. You may stop using the incentive spirometer when instructed by your caregiver.  RISKS AND COMPLICATIONS  Take your time so you do not get dizzy or light-headed.  If you are in pain, you may need to take or ask for pain medication before doing incentive spirometry. It is harder to take a deep breath if you are having pain. AFTER USE  Rest and breathe slowly and easily.  It can be helpful to keep track of a log of your progress. Your caregiver can provide you with a simple table to help with this. If you are using the spirometer at home, follow these instructions: [place] IF:   You are having difficultly using the spirometer.  You have trouble using the spirometer as often as instructed.  Your pain medication is not giving enough relief while using the spirometer.  You develop fever of 100.5 F (38.1 C) or higher. SEEK IMMEDIATE MEDICAL CARE IF:   You cough up bloody sputum that had not been present before.  You develop fever of 102 F (38.9 C) or greater.  You develop worsening pain at or near the incision site. MAKE SURE YOU:   Understand these instructions.  Will watch your condition.  Will get help right away if you are not doing well or get worse. Document Released: 06/11/2006 Document Revised: 04/23/2011 Document Reviewed: 08/12/2006 [facility] Patient Information 2014 [place], [place].   ________________________________________________________________________

## 2018-12-12 ENCOUNTER — Encounter (HOSPITAL_COMMUNITY)
Admission: RE | Admit: 2018-12-12 | Discharge: 2018-12-12 | Disposition: A | Discharge status: Home / Self Care | Payer: 59 | Source: Ambulatory Visit | Attending: Surgery | Admitting: Surgery

## 2018-12-12 ENCOUNTER — Other Ambulatory Visit: Payer: Self-pay

## 2018-12-12 ENCOUNTER — Encounter (HOSPITAL_COMMUNITY): Payer: Self-pay

## 2018-12-12 DIAGNOSIS — Z01818 Encounter for other preprocedural examination: Secondary | ICD-10-CM | POA: Insufficient documentation

## 2018-12-12 DIAGNOSIS — I10 Essential (primary) hypertension: Secondary | ICD-10-CM | POA: Diagnosis not present

## 2018-12-12 DIAGNOSIS — K5792 Diverticulitis of intestine, part unspecified, without perforation or abscess without bleeding: Secondary | ICD-10-CM | POA: Insufficient documentation

## 2018-12-12 HISTORY — DX: Diverticulitis of intestine, part unspecified, without perforation or abscess without bleeding: K57.92

## 2018-12-12 HISTORY — DX: Bipolar disorder, unspecified: F31.9

## 2018-12-12 LAB — BASIC METABOLIC PANEL
Anion gap: 10 (ref 5–15)
BUN: 10 mg/dL (ref 6–20)
CO2: 24 mmol/L (ref 22–32)
Calcium: 9.2 mg/dL (ref 8.9–10.3)
Chloride: 107 mmol/L (ref 98–111)
Creatinine, Ser: 0.97 mg/dL (ref 0.44–1.00)
GFR calc Af Amer: >60 mL/min (ref >60)
GFR calc non Af Amer: >60 mL/min (ref >60)
Glucose, Bld: 105 mg/dL — ABNORMAL HIGH (ref 70–99)
Potassium: 3.9 mmol/L (ref 3.5–5.1)
Sodium: 141 mmol/L (ref 135–145)

## 2018-12-12 LAB — CBC
HCT: 39 % (ref 36.0–46.0)
Hemoglobin: 12.5 g/dL (ref 12.0–15.0)
MCH: 30.8 pg (ref 26.0–34.0)
MCHC: 32.1 g/dL (ref 30.0–36.0)
MCV: 96.1 fL (ref 80.0–100.0)
Platelets: 367 10*3/uL (ref 150–400)
RBC: 4.06 MIL/uL (ref 3.87–5.11)
RDW: 13.6 % (ref 11.5–15.5)
WBC: 7.3 10*3/uL (ref 4.0–10.5)
nRBC: 0 % (ref 0.0–0.2)

## 2018-12-12 LAB — HEMOGLOBIN A1C
Hgb A1c MFr Bld: 5.7 % — ABNORMAL HIGH (ref 4.8–5.6)
Mean Plasma Glucose: 116.89 mg/dL

## 2018-12-12 LAB — GLUCOSE, CAPILLARY: Glucose-Capillary: 120 mg/dL — ABNORMAL HIGH (ref 70–99)

## 2018-12-12 LAB — ABO/RH: ABO/RH(D): O POS

## 2018-12-12 NOTE — Progress Notes (Signed)
PCP -Debbrah Alar, NP Cardiologist -   Chest x-ray -  EKG -  Stress Test -  ECHO - 2018 epic  Cardiac Cath -   Sleep Study -  CPAP -   Fasting Blood Sugar -  Checks Blood Sugar _____ times a day  Blood Thinner Instructions: Aspirin Instructions: Last Dose:  Anesthesia review:  reports she fainted at his sons house and they took her to ED  , troponin was elevated and cardiac cath was completed,  no stent placed and reports ECHO was normal.  she reports no casue was ever determined for the sycope episode and she has had no recurrence of syncope since that time .  Patient denies shortness of breath, fever, cough and chest pain at PAT appointment   Patient verbalized understanding of instructions that were given to them at the PAT appointment. Patient was also instructed that they will need to review over the PAT instructions again at home before surgery.

## 2018-12-13 ENCOUNTER — Other Ambulatory Visit (HOSPITAL_COMMUNITY)
Admission: RE | Admit: 2018-12-13 | Discharge: 2018-12-13 | Disposition: A | Discharge status: Home / Self Care | Payer: 59 | Source: Ambulatory Visit | Attending: Surgery | Admitting: Surgery

## 2018-12-13 DIAGNOSIS — Z20828 Contact with and (suspected) exposure to other viral communicable diseases: Secondary | ICD-10-CM | POA: Diagnosis not present

## 2018-12-13 DIAGNOSIS — Z01812 Encounter for preprocedural laboratory examination: Secondary | ICD-10-CM | POA: Insufficient documentation

## 2018-12-14 LAB — NOVEL CORONAVIRUS, NAA (HOSP ORDER, SEND-OUT TO REF LAB; TAT 18-24 HRS): SARS-CoV-2, NAA: NOT DETECTED

## 2018-12-15 ENCOUNTER — Other Ambulatory Visit: Payer: Self-pay | Admitting: *Deleted

## 2018-12-15 NOTE — Patient Outreach (Signed)
[facility]) Care Management  12/15/2018  Kaitlyn Harper May 26, 1960 [id number]   Preoperative Screening Call Referral received: 12/15/18 Surgery/procedure date:  Insurance: [place]  Subjective:  Initial successful telephone call to patient's preferred number in order to complete preoperative screening. 2 HIPAA identifiers verified. Discussed purpose of preoperative call. Patient voices understanding and agrees to call. She states she understands the reason for the surgery and the expected time of arrival 1030 on 12/17/18. She says she completed her preoperative testing on 10/30 /20 and has no additional questions, she reports Covid 19  testing negative and that she is on quarantine until surgery. She says she expects to be in the hospital 2-3 days not exactly sure. .   She states she has completed medical leave paperwork and does have the hospital indemnity benefit and is aware she will have to file the claim after her surgery. Patient concern about cost of surgery and states that she will have to set up payments, provided patient with [place] benefits office number to discuss current plan coverage.   She says she will have 24/7 care at home provided by her 2 daughters  to assist in her in  recovery. She does not have and is not interested in completing advanced directives at this time. She agrees to a post hospital discharge transition of care call.    Objective:  Per chart review, patient scheduled for on 11/420 at [facility]. she completed pre-op testing on 12/12/18  Assessment: Preoperative call completed, no preoperative needs identified.   Plan: RNCM will call patient for transition of care outreach within 72 hours of hospital discharge notification.   Joylene Draft, RN, [place] Management Coordinator  [phone]- Mobile [phone]- Toll Free Main Office

## 2018-12-16 MED ORDER — BUPIVACAINE LIPOSOME 1.3 % IJ SUSP
20.0000 mL | Freq: Once | INTRAMUSCULAR | Status: DC
  Filled 2018-12-16: qty 20

## 2018-12-16 MED ORDER — SODIUM CHLORIDE 0.9 % IV SOLN
INTRAVENOUS | Status: DC
  Filled 2018-12-16: qty 6

## 2018-12-16 NOTE — Anesthesia Preprocedure Evaluation (Addendum)
Anesthesia Evaluation  Patient identified by MRN, date of birth, ID band Patient awake    Reviewed: Allergy & Precautions, NPO status , Patient's Chart, lab work & pertinent test results, reviewed documented beta blocker date and time   Airway Mallampati: III  TM Distance: >3 FB Neck ROM: Full    Dental no notable dental hx. (+) Caps, Dental Advisory Given,    Pulmonary neg pulmonary ROS, Current Smoker and Patient abstained from smoking.,    Pulmonary exam normal breath sounds clear to auscultation       Cardiovascular hypertension, Pt. on home beta blockers and Pt. on medications + Past MI and + DOE  Normal cardiovascular exam Rhythm:Regular Rate:Normal  TTE 2018 - Left ventricle: The cavity size was normal. There was mild focal basal hypertrophy of the septum. Systolic function was vigorous.The estimated ejection fraction was in the range of 65% to 70%. Wall motion was normal; there were no regional wall motion abnormalities. Doppler parameters are consistent with abnormal left ventricular relaxation (grade 1 diastolic dysfunction). Doppler parameters are consistent with elevated ventricular end-diastolic filling pressure. - Aortic valve: There was no regurgitation. - Aortic root: The aortic root was normal in size. - Mitral valve: There was trivial regurgitation. - Right ventricle: The cavity size was normal. Wall thickness was normal. Systolic function was normal. - Tricuspid valve: There was trivial regurgitation. - Pulmonary arteries: Systolic pressure was within the normal range. - Inferior vena cava: The vessel was normal in size.  [place] 2018 1. Normal coronary anatomy 2. Normal LV function 3. Normal LVEDP.   Neuro/Psych PSYCHIATRIC DISORDERS Anxiety Depression Bipolar Disorder negative neurological ROS     GI/Hepatic negative GI ROS, Neg liver ROS,   Endo/Other  negative endocrine ROSdiabetes, Well Controlled  Renal/GU negative Renal ROS  negative genitourinary   Musculoskeletal negative musculoskeletal ROS (+)   Abdominal   Peds  Hematology negative hematology ROS (+)   Anesthesia Other Findings   Reproductive/Obstetrics                           Anesthesia Physical Anesthesia Plan  ASA: III  Anesthesia Plan: General and Regional   Post-op Pain Management:  Regional for Post-op pain   Induction: Intravenous  PONV Risk Score and Plan: Midazolam, Dexamethasone and Ondansetron  Airway Management Planned: Oral ETT  Additional Equipment:   Intra-op Plan:   Post-operative Plan: Extubation in OR  Informed Consent: I have reviewed the patients History and Physical, chart, labs and discussed the procedure including the risks, benefits and alternatives for the proposed anesthesia with the patient or authorized representative who has indicated his/her understanding and acceptance.     Dental advisory given  Plan Discussed with: CRNA  Anesthesia Plan Comments: (See PAT note 12/12/2018, Konrad Felix, PA-C)       Anesthesia Quick Evaluation

## 2018-12-16 NOTE — Progress Notes (Signed)
Anesthesia Chart Review   Case: [id number] Date/Time: 12/17/18 1215   Procedure: XI ROBOT ASSISTED RESECTION OF RECTOSIGMOID COLON, RIGID PROCTOSCOPY (N/A ) - BILATERAL TAP BLOCKS   Anesthesia type: General   Pre-op diagnosis: DIVERTICULITIS   Location: [facility] ROOM 02 / [facility]   Surgeon: Michael Boston, MD      DISCUSSION:58 y.o. current some day smoker with h/o HTN, HLD, DM II, bipolar disorder, diverticulitis scheduled for above procedure 12/17/2018 with Dr. Michael Boston.   Pt followed up with cardiologist, Dr. Percival Spanish, 05/07/2016 after syncopal episode.  Normal coronaries on Cardiac Cath 03/19/2016.  Echo 03/19/2016 with EF 65-70%, no wall motion abnormalities, trivial tricuspid regurg, trivial mitral regurg.  No recurrent episodes.  Advised to follow up prn.   Recent hospital admission 9/7-9/17/2020 with diverticulitis of large intestine with perforation and abscess.  Pt treated conservatively with antibiotics.  Stable at discharge. Pt had a follow up with PCP 11/04/2018.    Anticipate pt can proceed with planned procedure barring acute status change.   VS: BP (!) 147/78   Pulse 79   Temp 37 C (Oral)   Resp 16   Ht 5\' 4"  (1.626 m)   Wt 71.7 kg   LMP 12/25/2008   SpO2 100%   BMI 27.12 kg/m   PROVIDERS: Debbrah Alar, NP is PCP    LABS: Labs reviewed: Acceptable for surgery. (all labs ordered are listed, but only abnormal results are displayed)  Labs Reviewed  HEMOGLOBIN A1C - Abnormal; Notable for the following components:      Result Value   Hgb A1c MFr Bld 5.7 (*)    All other components within normal limits  GLUCOSE, CAPILLARY - Abnormal; Notable for the following components:   Glucose-Capillary 120 (*)    All other components within normal limits  BASIC METABOLIC PANEL - Abnormal; Notable for the following components:   Glucose, Bld 105 (*)    All other components within normal limits  CBC  TYPE AND SCREEN  ABO/RH     IMAGES: CT Abdomen Pelvis  11/06/2018 IMPRESSION: 1. Persistent pelvic abscesses. The right-sided pelvic abscess is slightly smaller and the left-sided pelvic abscess is stable. 2. Persistent inflammation and wall thickening of the mid sigmoid colon.  EKG: 12/12/2018 Rate 69 bpm  Sinus rhythm with 1st degree A-V block Possible Left atrial enlargement Low voltage QRS Cannot rule out Anterior infarct , age undetermined Abnormal ECG SINCE LAST TRACING HEART RATE HAS INCREASED  CV: Cardiac Cath 03/19/2016  The left ventricular systolic function is normal.  LV end diastolic pressure is normal.  The left ventricular ejection fraction is 55-65% by visual estimate.   1. Normal coronary anatomy 2. Normal LV function 3. Normal LVEDP.  Plan: medical management.   Echo 03/19/2016 Study Conclusions  - Left ventricle: The cavity size was normal. There was mild focal   basal hypertrophy of the septum. Systolic function was vigorous.   The estimated ejection fraction was in the range of 65% to 70%.   Wall motion was normal; there were no regional wall motion   abnormalities. Doppler parameters are consistent with abnormal   left ventricular relaxation (grade 1 diastolic dysfunction).   Doppler parameters are consistent with elevated ventricular   end-diastolic filling pressure. - Aortic valve: There was no regurgitation. - Aortic root: The aortic root was normal in size. - Mitral valve: There was trivial regurgitation. - Right ventricle: The cavity size was normal. Wall thickness was   normal. Systolic function was normal. -  Tricuspid valve: There was trivial regurgitation. - Pulmonary arteries: Systolic pressure was within the normal   range. - Inferior vena cava: The vessel was normal in size.  Impressions:  - Normal LVEF, no wall motion abnormalities.   Impaired relaxation with increased filling pressures. Past Medical History:  Diagnosis Date  . Anemia    resolved   . Anxiety   . Bipolar  disorder ([place])   . Depression   . Diabetes mellitus without complication ([place])    type 2-diet controlled  . Diverticulitis   . Hyperglycemia   . Hyperlipidemia   . Hypertension   . NSTEMI (non-ST elevated myocardial infarction) ([place]) 2018   reports she fainted at his sons house and they took her to ED  , troponin was elevated and cardiac cath was completed,  no stent placed nor hx of stent intervention , she reports no casue was ever determined for the sycope episode   . Syncope 03/18/2016   reports no recurrence     Past Surgical History:  Procedure Laterality Date  . APPENDECTOMY  1985  . CHOLECYSTECTOMY  1985  . COLONOSCOPY W/ POLYPECTOMY  03/01/2015   Dr Oretha Caprice, [facility].  +adenomatous polyps  . COLONOSCOPY W/ POLYPECTOMY  02/28/2011   4 polyps - 3 TA  . DILATION AND CURETTAGE OF UTERUS  2005  . [place]  2002  . KNEE SURGERY  1983   arthroscopy?  [name] LEFT HEART CATH AND CORONARY ANGIOGRAPHY N/A 03/19/2016   Procedure: Left Heart Cath and Coronary Angiography;  Surgeon: Peter M Martinique, MD;  Location: [place];  Service: Cardiovascular;  Laterality: N/A;  . MOUTH SURGERY  03/08/2016  . POLYPECTOMY  2006   ?anal polyp?  Kathee Polite DUCT PROBING  2004  . TUBAL LIGATION  1985    MEDICATIONS: . acetaminophen (TYLENOL) 325 MG tablet  . amLODipine-valsartan (EXFORGE) 10-320 MG tablet  . ARIPiprazole (ABILIFY) 10 MG tablet  . aspirin EC 81 MG tablet  . atorvastatin (LIPITOR) 20 MG tablet  . carvedilol (COREG) 6.25 MG tablet  . lamoTRIgine (LAMICTAL) 100 MG tablet  . ondansetron (ZOFRAN ODT) 4 MG disintegrating tablet  . saccharomyces boulardii (FLORASTOR) 250 MG capsule  . traZODone (DESYREL) 50 MG tablet   No current facility-administered medications for this encounter.    Derrill Memo ON 12/17/2018] bupivacaine liposome (EXPAREL) 1.3 % injection 266 mg  . [START ON 12/17/2018] clindamycin (CLEOCIN) 900 mg, gentamicin (GARAMYCIN) 240 mg in sodium chloride 0.9  % 1,000 mL for intraperitoneal lavage     Maia Plan [facility] Pre-Surgical Testing [phone] 12/16/18  10:59 AM

## 2018-12-17 ENCOUNTER — Inpatient Hospital Stay (HOSPITAL_COMMUNITY): Payer: 59 | Admitting: Registered Nurse

## 2018-12-17 ENCOUNTER — Other Ambulatory Visit: Payer: Self-pay

## 2018-12-17 ENCOUNTER — Inpatient Hospital Stay (HOSPITAL_COMMUNITY)
Admission: RE | Admit: 2018-12-17 | Discharge: 2018-12-22 | DRG: 330 | Disposition: A | Discharge status: Home / Self Care | Payer: 59 | Source: Other Acute Inpatient Hospital | Attending: Surgery | Admitting: Surgery

## 2018-12-17 ENCOUNTER — Encounter (HOSPITAL_COMMUNITY): Payer: Self-pay | Admitting: Emergency Medicine

## 2018-12-17 ENCOUNTER — Inpatient Hospital Stay (HOSPITAL_COMMUNITY): Payer: 59 | Admitting: Physician Assistant

## 2018-12-17 ENCOUNTER — Encounter (HOSPITAL_COMMUNITY)
Admission: RE | Disposition: A | Discharge status: Home / Self Care | Payer: Self-pay | Source: Other Acute Inpatient Hospital | Attending: Surgery

## 2018-12-17 DIAGNOSIS — F419 Anxiety disorder, unspecified: Secondary | ICD-10-CM | POA: Diagnosis present

## 2018-12-17 DIAGNOSIS — K567 Ileus, unspecified: Secondary | ICD-10-CM | POA: Diagnosis not present

## 2018-12-17 DIAGNOSIS — R12 Heartburn: Secondary | ICD-10-CM | POA: Diagnosis not present

## 2018-12-17 DIAGNOSIS — Z7982 Long term (current) use of aspirin: Secondary | ICD-10-CM | POA: Diagnosis not present

## 2018-12-17 DIAGNOSIS — Z882 Allergy status to sulfonamides status: Secondary | ICD-10-CM

## 2018-12-17 DIAGNOSIS — Z79899 Other long term (current) drug therapy: Secondary | ICD-10-CM

## 2018-12-17 DIAGNOSIS — J302 Other seasonal allergic rhinitis: Secondary | ICD-10-CM | POA: Diagnosis present

## 2018-12-17 DIAGNOSIS — D649 Anemia, unspecified: Secondary | ICD-10-CM

## 2018-12-17 DIAGNOSIS — F319 Bipolar disorder, unspecified: Secondary | ICD-10-CM | POA: Diagnosis present

## 2018-12-17 DIAGNOSIS — Z8719 Personal history of other diseases of the digestive system: Secondary | ICD-10-CM | POA: Diagnosis present

## 2018-12-17 DIAGNOSIS — I10 Essential (primary) hypertension: Secondary | ICD-10-CM | POA: Diagnosis not present

## 2018-12-17 DIAGNOSIS — N994 Postprocedural pelvic peritoneal adhesions: Secondary | ICD-10-CM | POA: Diagnosis present

## 2018-12-17 DIAGNOSIS — Z79891 Long term (current) use of opiate analgesic: Secondary | ICD-10-CM

## 2018-12-17 DIAGNOSIS — Z811 Family history of alcohol abuse and dependence: Secondary | ICD-10-CM | POA: Diagnosis not present

## 2018-12-17 DIAGNOSIS — E1122 Type 2 diabetes mellitus with diabetic chronic kidney disease: Secondary | ICD-10-CM

## 2018-12-17 DIAGNOSIS — K5732 Diverticulitis of large intestine without perforation or abscess without bleeding: Secondary | ICD-10-CM | POA: Diagnosis present

## 2018-12-17 DIAGNOSIS — E119 Type 2 diabetes mellitus without complications: Secondary | ICD-10-CM | POA: Diagnosis present

## 2018-12-17 DIAGNOSIS — N39490 Overflow incontinence: Secondary | ICD-10-CM | POA: Diagnosis not present

## 2018-12-17 DIAGNOSIS — Z9049 Acquired absence of other specified parts of digestive tract: Secondary | ICD-10-CM | POA: Diagnosis not present

## 2018-12-17 DIAGNOSIS — Z20828 Contact with and (suspected) exposure to other viral communicable diseases: Secondary | ICD-10-CM | POA: Diagnosis present

## 2018-12-17 DIAGNOSIS — F17210 Nicotine dependence, cigarettes, uncomplicated: Secondary | ICD-10-CM | POA: Diagnosis present

## 2018-12-17 DIAGNOSIS — K589 Irritable bowel syndrome without diarrhea: Secondary | ICD-10-CM | POA: Diagnosis present

## 2018-12-17 DIAGNOSIS — I252 Old myocardial infarction: Secondary | ICD-10-CM | POA: Diagnosis not present

## 2018-12-17 DIAGNOSIS — E785 Hyperlipidemia, unspecified: Secondary | ICD-10-CM | POA: Diagnosis present

## 2018-12-17 DIAGNOSIS — G4700 Insomnia, unspecified: Secondary | ICD-10-CM | POA: Diagnosis present

## 2018-12-17 DIAGNOSIS — K5720 Diverticulitis of large intestine with perforation and abscess without bleeding: Principal | ICD-10-CM | POA: Diagnosis present

## 2018-12-17 DIAGNOSIS — G8918 Other acute postprocedural pain: Secondary | ICD-10-CM | POA: Diagnosis not present

## 2018-12-17 DIAGNOSIS — K5730 Diverticulosis of large intestine without perforation or abscess without bleeding: Secondary | ICD-10-CM | POA: Diagnosis not present

## 2018-12-17 DIAGNOSIS — K660 Peritoneal adhesions (postprocedural) (postinfection): Secondary | ICD-10-CM | POA: Diagnosis not present

## 2018-12-17 HISTORY — DX: Personal history of other diseases of the digestive system: Z87.19

## 2018-12-17 LAB — GLUCOSE, CAPILLARY
Glucose-Capillary: 102 mg/dL — ABNORMAL HIGH (ref 70–99)
Glucose-Capillary: 159 mg/dL — ABNORMAL HIGH (ref 70–99)

## 2018-12-17 LAB — TYPE AND SCREEN
ABO/RH(D): O POS
Antibody Screen: NEGATIVE

## 2018-12-17 SURGERY — COLECTOMY, PARTIAL, ROBOT-ASSISTED, LAPAROSCOPIC
Anesthesia: Regional

## 2018-12-17 MED ORDER — PHENYLEPHRINE 40 MCG/ML (10ML) SYRINGE FOR IV PUSH (FOR BLOOD PRESSURE SUPPORT)
PREFILLED_SYRINGE | INTRAVENOUS | Status: AC
  Filled 2018-12-17: qty 10

## 2018-12-17 MED ORDER — TRAMADOL HCL 50 MG PO TABS
50.0000 mg | ORAL_TABLET | Freq: Four times a day (QID) | ORAL | Status: DC | PRN
  Administered 2018-12-17: 50 mg via ORAL
  Administered 2018-12-18 – 2018-12-19 (×2): 100 mg via ORAL
  Filled 2018-12-17 (×2): qty 2
  Filled 2018-12-17: qty 1

## 2018-12-17 MED ORDER — ASPIRIN EC 81 MG PO TBEC
81.0000 mg | DELAYED_RELEASE_TABLET | Freq: Every day | ORAL | Status: DC
  Administered 2018-12-17 – 2018-12-22 (×6): 81 mg via ORAL
  Filled 2018-12-17 (×5): qty 1

## 2018-12-17 MED ORDER — PROCHLORPERAZINE MALEATE 10 MG PO TABS
10.0000 mg | ORAL_TABLET | Freq: Four times a day (QID) | ORAL | Status: DC | PRN
  Administered 2018-12-21: 10 mg via ORAL
  Filled 2018-12-17: qty 1

## 2018-12-17 MED ORDER — IRBESARTAN 300 MG PO TABS
300.0000 mg | ORAL_TABLET | Freq: Every day | ORAL | Status: DC
  Administered 2018-12-17 – 2018-12-20 (×4): 300 mg via ORAL
  Filled 2018-12-17 (×4): qty 1

## 2018-12-17 MED ORDER — 0.9 % SODIUM CHLORIDE (POUR BTL) OPTIME
TOPICAL | Status: DC | PRN
  Administered 2018-12-17: 15:00:00 2000 mL

## 2018-12-17 MED ORDER — ROCURONIUM BROMIDE 10 MG/ML (PF) SYRINGE
PREFILLED_SYRINGE | INTRAVENOUS | Status: DC | PRN
  Administered 2018-12-17: 60 mg via INTRAVENOUS

## 2018-12-17 MED ORDER — ACETAMINOPHEN 500 MG PO TABS
1000.0000 mg | ORAL_TABLET | Freq: Four times a day (QID) | ORAL | Status: DC
  Administered 2018-12-17 – 2018-12-22 (×14): 1000 mg via ORAL
  Filled 2018-12-17 (×15): qty 2

## 2018-12-17 MED ORDER — PSYLLIUM 95 % PO PACK
1.0000 | PACK | Freq: Every day | ORAL | Status: DC
  Administered 2018-12-17 – 2018-12-19 (×3): 1 via ORAL
  Filled 2018-12-17 (×3): qty 1

## 2018-12-17 MED ORDER — ENOXAPARIN SODIUM 40 MG/0.4ML ~~LOC~~ SOLN
40.0000 mg | SUBCUTANEOUS | Status: DC
  Administered 2018-12-18 – 2018-12-22 (×5): 40 mg via SUBCUTANEOUS
  Filled 2018-12-17 (×5): qty 0.4

## 2018-12-17 MED ORDER — BISACODYL 5 MG PO TBEC
20.0000 mg | DELAYED_RELEASE_TABLET | Freq: Once | ORAL | Status: DC
  Filled 2018-12-17: qty 4

## 2018-12-17 MED ORDER — MIDAZOLAM HCL 2 MG/2ML IJ SOLN
1.0000 mg | Freq: Once | INTRAMUSCULAR | Status: AC
  Administered 2018-12-17: 2 mg via INTRAVENOUS

## 2018-12-17 MED ORDER — LIDOCAINE 2% (20 MG/ML) 5 ML SYRINGE
INTRAMUSCULAR | Status: AC
  Filled 2018-12-17: qty 5

## 2018-12-17 MED ORDER — LIDOCAINE 2% (20 MG/ML) 5 ML SYRINGE
INTRAMUSCULAR | Status: DC | PRN
  Administered 2018-12-17: 60 mg via INTRAVENOUS

## 2018-12-17 MED ORDER — DEXAMETHASONE SODIUM PHOSPHATE 10 MG/ML IJ SOLN
INTRAMUSCULAR | Status: AC
  Filled 2018-12-17: qty 1

## 2018-12-17 MED ORDER — ARIPIPRAZOLE 10 MG PO TABS
10.0000 mg | ORAL_TABLET | Freq: Every day | ORAL | Status: DC
  Administered 2018-12-18 – 2018-12-22 (×5): 10 mg via ORAL
  Filled 2018-12-17 (×5): qty 1

## 2018-12-17 MED ORDER — SUGAMMADEX SODIUM 200 MG/2ML IV SOLN
INTRAVENOUS | Status: DC | PRN
  Administered 2018-12-17: 150 mg via INTRAVENOUS

## 2018-12-17 MED ORDER — LIDOCAINE HCL 2 % IJ SOLN
INTRAMUSCULAR | Status: AC
  Filled 2018-12-17: qty 20

## 2018-12-17 MED ORDER — MIDAZOLAM HCL 2 MG/2ML IJ SOLN
INTRAMUSCULAR | Status: AC
  Filled 2018-12-17: qty 2

## 2018-12-17 MED ORDER — MAGIC MOUTHWASH
15.0000 mL | Freq: Four times a day (QID) | ORAL | Status: DC | PRN
  Filled 2018-12-17: qty 15

## 2018-12-17 MED ORDER — SODIUM CHLORIDE 0.9 % IV SOLN: Freq: Three times a day (TID) | INTRAVENOUS | Status: DC | PRN

## 2018-12-17 MED ORDER — FENTANYL CITRATE (PF) 100 MCG/2ML IJ SOLN
INTRAMUSCULAR | Status: AC
  Filled 2018-12-17: qty 2

## 2018-12-17 MED ORDER — ROCURONIUM BROMIDE 10 MG/ML (PF) SYRINGE
PREFILLED_SYRINGE | INTRAVENOUS | Status: AC
  Filled 2018-12-17: qty 10

## 2018-12-17 MED ORDER — SODIUM CHLORIDE 0.9 % IV SOLN
2.0000 g | INTRAVENOUS | Status: AC
  Administered 2018-12-17: 2 g via INTRAVENOUS
  Filled 2018-12-17: qty 2

## 2018-12-17 MED ORDER — SODIUM CHLORIDE (PF) 0.9 % IJ SOLN
INTRAMUSCULAR | Status: AC
  Filled 2018-12-17: qty 50

## 2018-12-17 MED ORDER — KETAMINE HCL 10 MG/ML IJ SOLN
INTRAMUSCULAR | Status: DC | PRN
  Administered 2018-12-17: 10 mg via INTRAVENOUS
  Administered 2018-12-17: 20 mg via INTRAVENOUS

## 2018-12-17 MED ORDER — SODIUM CHLORIDE (PF) 0.9 % IJ SOLN
INTRAMUSCULAR | Status: DC | PRN
  Administered 2018-12-17: 40 mL

## 2018-12-17 MED ORDER — BUPIVACAINE LIPOSOME 1.3 % IJ SUSP
INTRAMUSCULAR | Status: DC | PRN
  Administered 2018-12-17: 60 mL

## 2018-12-17 MED ORDER — LACTATED RINGERS IR SOLN
Status: DC | PRN
  Administered 2018-12-17: 1000 mL

## 2018-12-17 MED ORDER — SODIUM CHLORIDE 0.9% FLUSH
3.0000 mL | Freq: Two times a day (BID) | INTRAVENOUS | Status: DC
  Administered 2018-12-18 – 2018-12-22 (×8): 3 mL via INTRAVENOUS

## 2018-12-17 MED ORDER — EPHEDRINE SULFATE-NACL 50-0.9 MG/10ML-% IV SOSY
PREFILLED_SYRINGE | INTRAVENOUS | Status: DC | PRN
  Administered 2018-12-17 (×2): 5 mg via INTRAVENOUS

## 2018-12-17 MED ORDER — LACTATED RINGERS IV SOLN
INTRAVENOUS | Status: AC
  Administered 2018-12-18: 02:00:00 via INTRAVENOUS

## 2018-12-17 MED ORDER — PHENYLEPHRINE 40 MCG/ML (10ML) SYRINGE FOR IV PUSH (FOR BLOOD PRESSURE SUPPORT)
PREFILLED_SYRINGE | INTRAVENOUS | Status: DC | PRN
  Administered 2018-12-17 (×4): 80 ug via INTRAVENOUS

## 2018-12-17 MED ORDER — ACETAMINOPHEN 500 MG PO TABS
1000.0000 mg | ORAL_TABLET | ORAL | Status: AC
  Administered 2018-12-17: 1000 mg via ORAL
  Filled 2018-12-17: qty 2

## 2018-12-17 MED ORDER — LIP MEDEX EX OINT
1.0000 "application " | TOPICAL_OINTMENT | Freq: Two times a day (BID) | CUTANEOUS | Status: DC
  Administered 2018-12-17 – 2018-12-22 (×10): 1 via TOPICAL
  Filled 2018-12-17 (×2): qty 7

## 2018-12-17 MED ORDER — ALUM & MAG HYDROXIDE-SIMETH 200-200-20 MG/5ML PO SUSP
30.0000 mL | Freq: Four times a day (QID) | ORAL | Status: DC | PRN
  Administered 2018-12-19 – 2018-12-21 (×3): 30 mL via ORAL
  Filled 2018-12-17 (×4): qty 30

## 2018-12-17 MED ORDER — BUPIVACAINE-EPINEPHRINE 0.25% -1:200000 IJ SOLN
INTRAMUSCULAR | Status: AC
  Filled 2018-12-17: qty 1

## 2018-12-17 MED ORDER — ENOXAPARIN SODIUM 40 MG/0.4ML ~~LOC~~ SOLN
40.0000 mg | Freq: Once | SUBCUTANEOUS | Status: AC
  Administered 2018-12-17: 40 mg via SUBCUTANEOUS
  Filled 2018-12-17: qty 0.4

## 2018-12-17 MED ORDER — GABAPENTIN 100 MG PO CAPS
200.0000 mg | ORAL_CAPSULE | Freq: Three times a day (TID) | ORAL | Status: DC
  Administered 2018-12-17 – 2018-12-18 (×4): 200 mg via ORAL
  Filled 2018-12-17 (×5): qty 2

## 2018-12-17 MED ORDER — DEXAMETHASONE SODIUM PHOSPHATE 10 MG/ML IJ SOLN
INTRAMUSCULAR | Status: DC | PRN
  Administered 2018-12-17: 8 mg via INTRAVENOUS

## 2018-12-17 MED ORDER — PROPOFOL 10 MG/ML IV BOLUS
INTRAVENOUS | Status: AC
  Filled 2018-12-17: qty 20

## 2018-12-17 MED ORDER — DIPHENHYDRAMINE HCL 50 MG/ML IJ SOLN: 12.5000 mg | Freq: Four times a day (QID) | INTRAMUSCULAR | Status: DC | PRN

## 2018-12-17 MED ORDER — HYDROMORPHONE HCL 1 MG/ML IJ SOLN
0.5000 mg | INTRAMUSCULAR | Status: DC | PRN
  Administered 2018-12-18: 1 mg via INTRAVENOUS
  Administered 2018-12-18: 2 mg via INTRAVENOUS
  Administered 2018-12-18 – 2018-12-19 (×2): 1 mg via INTRAVENOUS
  Filled 2018-12-17: qty 1
  Filled 2018-12-17: qty 2
  Filled 2018-12-17 (×2): qty 1

## 2018-12-17 MED ORDER — ALVIMOPAN 12 MG PO CAPS
12.0000 mg | ORAL_CAPSULE | Freq: Two times a day (BID) | ORAL | Status: DC
  Administered 2018-12-18 – 2018-12-19 (×4): 12 mg via ORAL
  Filled 2018-12-17 (×4): qty 1

## 2018-12-17 MED ORDER — AMLODIPINE BESYLATE 10 MG PO TABS
10.0000 mg | ORAL_TABLET | Freq: Every day | ORAL | Status: DC
  Administered 2018-12-17 – 2018-12-22 (×6): 10 mg via ORAL
  Filled 2018-12-17 (×6): qty 1

## 2018-12-17 MED ORDER — ONDANSETRON HCL 4 MG/2ML IJ SOLN
INTRAMUSCULAR | Status: DC | PRN
  Administered 2018-12-17: 4 mg via INTRAVENOUS

## 2018-12-17 MED ORDER — FENTANYL CITRATE (PF) 100 MCG/2ML IJ SOLN
INTRAMUSCULAR | Status: DC | PRN
  Administered 2018-12-17 (×3): 50 ug via INTRAVENOUS

## 2018-12-17 MED ORDER — ALVIMOPAN 12 MG PO CAPS
12.0000 mg | ORAL_CAPSULE | ORAL | Status: AC
  Administered 2018-12-17: 12 mg via ORAL
  Filled 2018-12-17: qty 1

## 2018-12-17 MED ORDER — METRONIDAZOLE 500 MG PO TABS: 1000.0000 mg | ORAL_TABLET | ORAL | Status: DC

## 2018-12-17 MED ORDER — ENSURE SURGERY PO LIQD
237.0000 mL | Freq: Two times a day (BID) | ORAL | Status: DC
  Administered 2018-12-18 – 2018-12-22 (×7): 237 mL via ORAL
  Filled 2018-12-17 (×10): qty 237

## 2018-12-17 MED ORDER — INDOCYANINE GREEN 25 MG IV SOLR
INTRAVENOUS | Status: DC | PRN
  Administered 2018-12-17: 5 mg via INTRAVENOUS

## 2018-12-17 MED ORDER — BUPIVACAINE HCL (PF) 0.25 % IJ SOLN
INTRAMUSCULAR | Status: AC
  Filled 2018-12-17: qty 60

## 2018-12-17 MED ORDER — AMLODIPINE BESYLATE-VALSARTAN 10-320 MG PO TABS: 1.0000 | ORAL_TABLET | Freq: Every day | ORAL | Status: DC

## 2018-12-17 MED ORDER — SODIUM CHLORIDE 0.9% FLUSH: 3.0000 mL | INTRAVENOUS | Status: DC | PRN

## 2018-12-17 MED ORDER — DIPHENHYDRAMINE HCL 12.5 MG/5ML PO ELIX: 12.5000 mg | ORAL_SOLUTION | Freq: Four times a day (QID) | ORAL | Status: DC | PRN

## 2018-12-17 MED ORDER — ATORVASTATIN CALCIUM 20 MG PO TABS
20.0000 mg | ORAL_TABLET | Freq: Every day | ORAL | Status: DC
  Administered 2018-12-18 – 2018-12-22 (×5): 20 mg via ORAL
  Filled 2018-12-17 (×5): qty 1

## 2018-12-17 MED ORDER — EPHEDRINE 5 MG/ML INJ
INTRAVENOUS | Status: AC
  Filled 2018-12-17: qty 10

## 2018-12-17 MED ORDER — POLYETHYLENE GLYCOL 3350 17 GM/SCOOP PO POWD: 1.0000 | Freq: Once | ORAL | Status: DC

## 2018-12-17 MED ORDER — ACETAMINOPHEN 325 MG PO TABS: 650.0000 mg | ORAL_TABLET | Freq: Four times a day (QID) | ORAL | Status: DC | PRN

## 2018-12-17 MED ORDER — TRAZODONE HCL 100 MG PO TABS
100.0000 mg | ORAL_TABLET | Freq: Every day | ORAL | Status: DC
  Administered 2018-12-17 – 2018-12-18 (×2): 100 mg via ORAL
  Filled 2018-12-17 (×2): qty 1

## 2018-12-17 MED ORDER — METOPROLOL TARTRATE 5 MG/5ML IV SOLN: 5.0000 mg | Freq: Four times a day (QID) | INTRAVENOUS | Status: DC | PRN

## 2018-12-17 MED ORDER — LACTATED RINGERS IV SOLN
INTRAVENOUS | Status: DC
  Administered 2018-12-17 (×3): via INTRAVENOUS

## 2018-12-17 MED ORDER — ONDANSETRON HCL 4 MG/2ML IJ SOLN
4.0000 mg | Freq: Four times a day (QID) | INTRAMUSCULAR | Status: DC | PRN
  Administered 2018-12-20 (×2): 4 mg via INTRAVENOUS
  Filled 2018-12-17 (×2): qty 2

## 2018-12-17 MED ORDER — PROCHLORPERAZINE EDISYLATE 10 MG/2ML IJ SOLN: 5.0000 mg | Freq: Four times a day (QID) | INTRAMUSCULAR | Status: DC | PRN

## 2018-12-17 MED ORDER — SODIUM CHLORIDE 0.9 % IV SOLN
2.0000 g | Freq: Two times a day (BID) | INTRAVENOUS | Status: AC
  Administered 2018-12-18: 2 g via INTRAVENOUS
  Filled 2018-12-17: qty 2

## 2018-12-17 MED ORDER — MIDAZOLAM HCL 5 MG/5ML IJ SOLN
INTRAMUSCULAR | Status: DC | PRN
  Administered 2018-12-17: 0.5 mg via INTRAVENOUS

## 2018-12-17 MED ORDER — GABAPENTIN 300 MG PO CAPS
300.0000 mg | ORAL_CAPSULE | ORAL | Status: AC
  Administered 2018-12-17: 300 mg via ORAL
  Filled 2018-12-17: qty 1

## 2018-12-17 MED ORDER — LAMOTRIGINE 100 MG PO TABS
100.0000 mg | ORAL_TABLET | Freq: Two times a day (BID) | ORAL | Status: DC
  Administered 2018-12-17 – 2018-12-22 (×9): 100 mg via ORAL
  Filled 2018-12-17 (×10): qty 1

## 2018-12-17 MED ORDER — FENTANYL CITRATE (PF) 100 MCG/2ML IJ SOLN: 25.0000 ug | INTRAMUSCULAR | Status: DC | PRN

## 2018-12-17 MED ORDER — FENTANYL CITRATE (PF) 100 MCG/2ML IJ SOLN
50.0000 ug | Freq: Once | INTRAMUSCULAR | Status: AC
  Administered 2018-12-17: 100 ug via INTRAVENOUS

## 2018-12-17 MED ORDER — ENALAPRILAT 1.25 MG/ML IV SOLN
0.6250 mg | Freq: Four times a day (QID) | INTRAVENOUS | Status: DC | PRN
  Filled 2018-12-17: qty 1

## 2018-12-17 MED ORDER — NEOMYCIN SULFATE 500 MG PO TABS: 1000.0000 mg | ORAL_TABLET | ORAL | Status: DC

## 2018-12-17 MED ORDER — SACCHAROMYCES BOULARDII 250 MG PO CAPS
250.0000 mg | ORAL_CAPSULE | Freq: Two times a day (BID) | ORAL | Status: DC
  Administered 2018-12-17 – 2018-12-22 (×9): 250 mg via ORAL
  Filled 2018-12-17 (×10): qty 1

## 2018-12-17 MED ORDER — ONDANSETRON HCL 4 MG PO TABS: 4.0000 mg | ORAL_TABLET | Freq: Four times a day (QID) | ORAL | Status: DC | PRN

## 2018-12-17 MED ORDER — ONDANSETRON HCL 4 MG/2ML IJ SOLN
INTRAMUSCULAR | Status: AC
  Filled 2018-12-17: qty 2

## 2018-12-17 MED ORDER — PROPOFOL 10 MG/ML IV BOLUS
INTRAVENOUS | Status: DC | PRN
  Administered 2018-12-17: 150 mg via INTRAVENOUS

## 2018-12-17 MED ORDER — ONDANSETRON 4 MG PO TBDP: 4.0000 mg | ORAL_TABLET | Freq: Three times a day (TID) | ORAL | Status: DC | PRN

## 2018-12-17 MED ORDER — FENTANYL CITRATE (PF) 250 MCG/5ML IJ SOLN
INTRAMUSCULAR | Status: AC
  Filled 2018-12-17: qty 5

## 2018-12-17 MED ORDER — CARVEDILOL 6.25 MG PO TABS
6.2500 mg | ORAL_TABLET | Freq: Two times a day (BID) | ORAL | Status: DC
  Administered 2018-12-18 – 2018-12-22 (×8): 6.25 mg via ORAL
  Filled 2018-12-17 (×8): qty 1

## 2018-12-17 MED ORDER — SODIUM CHLORIDE 0.9 % IV SOLN: 250.0000 mL | INTRAVENOUS | Status: DC | PRN

## 2018-12-17 SURGICAL SUPPLY — 116 items
APL PRP STRL LF DISP 70% ISPRP (MISCELLANEOUS) ×1
APPLIER CLIP 5 13 M/L LIGAMAX5 (MISCELLANEOUS)
APPLIER CLIP ROT 10 11.4 M/L (STAPLE)
APR CLP MED LRG 11.4X10 (STAPLE)
APR CLP MED LRG 5 ANG JAW (MISCELLANEOUS)
BLADE EXTENDED COATED 6.5IN (ELECTRODE) ×2 IMPLANT
CANNULA REDUC XI 12-8 STAPL (CANNULA) ×1
CANNULA REDUCER 12-8 DVNC XI (CANNULA) ×1 IMPLANT
CELLS DAT CNTRL 66122 CELL SVR (MISCELLANEOUS) IMPLANT
CHLORAPREP W/TINT 26 (MISCELLANEOUS) ×2 IMPLANT
CLIP APPLIE 5 13 M/L LIGAMAX5 (MISCELLANEOUS) IMPLANT
CLIP APPLIE ROT 10 11.4 M/L (STAPLE) IMPLANT
CLIP VESOLOCK LG 6/CT PURPLE (CLIP) IMPLANT
CLIP VESOLOCK MED LG 6/CT (CLIP) IMPLANT
COVER SURGICAL LIGHT HANDLE (MISCELLANEOUS) ×4 IMPLANT
COVER TIP SHEARS 8 DVNC (MISCELLANEOUS) ×1 IMPLANT
COVER TIP SHEARS 8MM DA VINCI (MISCELLANEOUS) ×1
COVER WAND RF STERILE (DRAPES) IMPLANT
DECANTER SPIKE VIAL GLASS SM (MISCELLANEOUS) ×2 IMPLANT
DEVICE TROCAR PUNCTURE CLOSURE (ENDOMECHANICALS) IMPLANT
DRAIN CHANNEL 19F RND (DRAIN) ×2 IMPLANT
DRAPE ARM DVNC X/XI (DISPOSABLE) ×4 IMPLANT
DRAPE COLUMN DVNC XI (DISPOSABLE) ×1 IMPLANT
DRAPE DA VINCI XI ARM (DISPOSABLE) ×4
DRAPE DA VINCI XI COLUMN (DISPOSABLE) ×1
DRAPE SURG IRRIG POUCH 19X23 (DRAPES) ×2 IMPLANT
DRSG OPSITE POSTOP 4X10 (GAUZE/BANDAGES/DRESSINGS) IMPLANT
DRSG OPSITE POSTOP 4X6 (GAUZE/BANDAGES/DRESSINGS) IMPLANT
DRSG OPSITE POSTOP 4X8 (GAUZE/BANDAGES/DRESSINGS) IMPLANT
DRSG TEGADERM 2-3/8X2-3/4 SM (GAUZE/BANDAGES/DRESSINGS) ×10 IMPLANT
DRSG TEGADERM 4X4.75 (GAUZE/BANDAGES/DRESSINGS) ×2 IMPLANT
ELECT PENCIL ROCKER SW 15FT (MISCELLANEOUS) ×2 IMPLANT
ELECT REM PT RETURN 15FT ADLT (MISCELLANEOUS) ×2 IMPLANT
ENDOLOOP SUT PDS II  0 18 (SUTURE)
ENDOLOOP SUT PDS II 0 18 (SUTURE) IMPLANT
EVACUATOR SILICONE 100CC (DRAIN) ×2 IMPLANT
GAUZE SPONGE 2X2 8PLY STRL LF (GAUZE/BANDAGES/DRESSINGS) ×1 IMPLANT
GAUZE SPONGE 4X4 12PLY STRL (GAUZE/BANDAGES/DRESSINGS) IMPLANT
GLOVE ECLIPSE 8.0 STRL XLNG CF (GLOVE) ×10 IMPLANT
GLOVE INDICATOR 8.0 STRL GRN (GLOVE) ×10 IMPLANT
GOWN STRL REUS W/TWL XL LVL3 (GOWN DISPOSABLE) ×10 IMPLANT
GRASPER SUT TROCAR 14GX15 (MISCELLANEOUS) ×2 IMPLANT
HOLDER FOLEY CATH W/STRAP (MISCELLANEOUS) ×2 IMPLANT
IRRIG SUCT STRYKERFLOW 2 WTIP (MISCELLANEOUS) ×2
IRRIGATION SUCT STRKRFLW 2 WTP (MISCELLANEOUS) IMPLANT
KIT PROCEDURE DA VINCI SI (MISCELLANEOUS) ×1
KIT PROCEDURE DVNC SI (MISCELLANEOUS) IMPLANT
KIT TURNOVER KIT A (KITS) IMPLANT
NDL INSUFFLATION 14GA 120MM (NEEDLE) ×1 IMPLANT
NEEDLE INSUFFLATION 14GA 120MM (NEEDLE) ×2 IMPLANT
PACK CARDIOVASCULAR III (CUSTOM PROCEDURE TRAY) ×2 IMPLANT
PACK COLON (CUSTOM PROCEDURE TRAY) ×2 IMPLANT
PAD POSITIONING PINK XL (MISCELLANEOUS) ×2 IMPLANT
PORT LAP GEL ALEXIS MED 5-9CM (MISCELLANEOUS) ×2 IMPLANT
PROTECTOR NERVE ULNAR (MISCELLANEOUS) ×4 IMPLANT
RELOAD PROXIMATE 75MM BLUE (ENDOMECHANICALS) ×4 IMPLANT
RELOAD STAPLE 45 BLU REG DVNC (STAPLE) IMPLANT
RELOAD STAPLE 45 GRN THCK DVNC (STAPLE) IMPLANT
RELOAD STAPLE 60 4.3 GRN DVNC (STAPLE) IMPLANT
RELOAD STAPLE 75 3.8 BLU REG (ENDOMECHANICALS) IMPLANT
RELOAD STAPLER 4.3X60 GRN DVNC (STAPLE) ×1 IMPLANT
RETRACTOR WND ALEXIS 18 MED (MISCELLANEOUS) IMPLANT
RTRCTR WOUND ALEXIS 18CM MED (MISCELLANEOUS)
SCISSORS LAP 5X35 DISP (ENDOMECHANICALS) ×2 IMPLANT
SEAL CANN UNIV 5-8 DVNC XI (MISCELLANEOUS) ×4 IMPLANT
SEAL XI 5MM-8MM UNIVERSAL (MISCELLANEOUS) ×4
SEALER VESSEL DA VINCI XI (MISCELLANEOUS) ×1
SEALER VESSEL EXT DVNC XI (MISCELLANEOUS) ×1 IMPLANT
SLEEVE ADV FIXATION 5X100MM (TROCAR) IMPLANT
SOLUTION ELECTROLUBE (MISCELLANEOUS) ×2 IMPLANT
SPONGE GAUZE 2X2 STER 10/PKG (GAUZE/BANDAGES/DRESSINGS) ×1
STAPLER 45 BLU RELOAD XI (STAPLE) IMPLANT
STAPLER 45 BLUE RELOAD XI (STAPLE)
STAPLER 45 GREEN RELOAD XI (STAPLE)
STAPLER 45 GRN RELOAD XI (STAPLE) IMPLANT
STAPLER 60 DA VINCI SURE FORM (STAPLE) ×1
STAPLER 60 SUREFORM DVNC (STAPLE) IMPLANT
STAPLER 90 3.5 STAND SLIM (STAPLE) ×2
STAPLER 90 3.5 STD SLIM (STAPLE) IMPLANT
STAPLER CANNULA SEAL DVNC XI (STAPLE) ×1 IMPLANT
STAPLER CANNULA SEAL XI (STAPLE) ×1
STAPLER ECHELON POWER CIR 31 (STAPLE) ×1 IMPLANT
STAPLER PROXIMATE 75MM BLUE (STAPLE) ×1 IMPLANT
STAPLER RELOAD 4.3X60 GREEN (STAPLE) ×1
STAPLER RELOAD 4.3X60 GRN DVNC (STAPLE) ×1
STAPLER SHEATH (SHEATH) ×1
STAPLER SHEATH ENDOWRIST DVNC (SHEATH) ×1 IMPLANT
SURGILUBE 2OZ TUBE FLIPTOP (MISCELLANEOUS) ×2 IMPLANT
SUT MNCRL AB 4-0 PS2 18 (SUTURE) ×2 IMPLANT
SUT PDS AB 1 CT1 27 (SUTURE) ×4 IMPLANT
SUT PDS AB 1 TP1 96 (SUTURE) IMPLANT
SUT PROLENE 0 CT 2 (SUTURE) IMPLANT
SUT PROLENE 2 0 KS (SUTURE) ×1 IMPLANT
SUT PROLENE 2 0 SH DA (SUTURE) ×1 IMPLANT
SUT SILK 0 (SUTURE) ×2
SUT SILK 0 30XBRD TIE 6 (SUTURE) IMPLANT
SUT SILK 2 0 (SUTURE)
SUT SILK 2 0 SH CR/8 (SUTURE) IMPLANT
SUT SILK 2-0 18XBRD TIE 12 (SUTURE) IMPLANT
SUT SILK 3 0 (SUTURE) ×2
SUT SILK 3 0 SH CR/8 (SUTURE) ×2 IMPLANT
SUT SILK 3-0 18XBRD TIE 12 (SUTURE) ×1 IMPLANT
SUT V-LOC BARB 180 2/0GR6 GS22 (SUTURE)
SUT VIC AB 3-0 SH 18 (SUTURE) IMPLANT
SUT VIC AB 3-0 SH 27 (SUTURE)
SUT VIC AB 3-0 SH 27XBRD (SUTURE) IMPLANT
SUT VICRYL 0 UR6 27IN ABS (SUTURE) ×2 IMPLANT
SUTURE V-LC BRB 180 2/0GR6GS22 (SUTURE) IMPLANT
SYR 10ML ECCENTRIC (SYRINGE) ×2 IMPLANT
SYS LAPSCP GELPORT 120MM (MISCELLANEOUS)
SYSTEM LAPSCP GELPORT 120MM (MISCELLANEOUS) IMPLANT
TAPE UMBILICAL COTTON 1/8X30 (MISCELLANEOUS) ×2 IMPLANT
TOWEL OR NON WOVEN STRL DISP B (DISPOSABLE) ×2 IMPLANT
TROCAR ADV FIXATION 5X100MM (TROCAR) ×2 IMPLANT
TUBING CONNECTING 10 (TUBING) ×4 IMPLANT
TUBING INSUFFLATION 10FT LAP (TUBING) ×2 IMPLANT

## 2018-12-17 NOTE — H&P (Signed)
[name] DOB: 1960/07/17 Single / Language: Kaitlyn Harper / Race: Black or African American Female  Patient Care Team: Debbrah Alar, NP as PCP - General (Internal Medicine) Leo Grosser Seymour Bars, MD (Inactive) as Consulting Physician (Obstetrics and Gynecology) Milus Banister, MD as Consulting Physician (Gastroenterology) Minus Breeding, MD as Consulting Physician (Cardiology) Michael Boston, MD as Consulting Physician (Colon and Rectal Surgery) Alfonzo Feller, RN as [facility]  ` ` Patient sent for surgical consultation  Chief Complaint: Follow-up on diverticulitis Kaitlyn Harper The patient is a woman admitted 9/7 for diverticulitis with a 7 cm abscess. Placed on antibiotics. Not amenable to percutaneous drainage. Clinically improved and was discharged 10 days later. Follow-up CAT scan on 9/24 and noted to persistent fluid collections. Largest 5 cm. Other 3 cm. Was feeling better at her primary care office visit last week. We'll finish up on her Augmentin antibiotics in the next few days. Here for follow-up. She has a history of colon polyps. Last colonoscopy 2017 showed tubular adenomas by Dr. Oretha Harper. Her appetite is close to normal. She has been focusing on mostly soft foods. Moving bowels every day. Not fully formed but definitely improved. Energy level coming back. Denies much pain. No fevers or chills. She does have a history of syncope and crushable AV block. Underwent catheterization 2 years ago that was quite underwhelming overall. Stable on Coreg. Not on blood thinners. She does not smoke. Some question of borderline diabetes but her A1c is been 5.5-6 in the past 5 years. He is not on any diabetic medication.   No personal nor family history of GI/colon cancer, inflammatory bowel disease, irritable bowel syndrome, allergy such as Celiac Sprue, dietary/dairy problems, colitis, ulcers nor gastritis. No recent sick  contacts/gastroenteritis. No travel outside the country. No changes in diet. No dysphagia to solids or liquids. No significant heartburn or reflux. No hematochezia, hematemesis, coffee ground emesis. No evidence of prior gastric/peptic ulceration.  Ready for surgery  (Review of systems as stated in this history (HPI) or in the review of systems. Otherwise all other 12 point ROS are negative) ` ` `   Past Surgical History (Kaitlyn Harper, CMA; 11/11/2018 9:38 AM) Appendectomy Breast Biopsy Right. Colon Polyp Removal - Colonoscopy Hemorrhoidectomy  Diagnostic Studies History (Kaitlyn Harper, Kaitlyn Harper; 11/11/2018 9:38 AM) Colonoscopy 1-5 years ago Pap Smear 1-5 years ago  Allergies (Kaitlyn Harper, [place]; 11/11/2018 9:40 AM) Sulfacetamide *CHEMICALS*  Medication History (Kaitlyn Harper, CMA; 11/11/2018 9:43 AM) Tylenol (Oral) Specific strength unknown - Active. Albuterol Sulfate HFA (108 (90 Base)MCG/ACT Aerosol Soln, Inhalation) Active. amLODIPine Besylate-Valsartan (10-320MG  Tablet, Oral) Active. Amoxicillin-Pot Clavulanate (875-125MG  Tablet, Oral) Active. ARIPiprazole (10MG  Tablet, Oral) Active. Aspirin (81MG  Tablet, Oral) Active. Atorvastatin Calcium (20MG  Tablet, Oral) Active. Carvedilol (6.25MG  Tablet, Oral) Active. lamoTRIgine (100MG  Tablet, Oral) Active. Ondansetron (4MG  Tablet Disint, Oral) Active. Saccharomyces boulardii (250MG  Capsule, Oral) Active. traZODone HCl (50MG  Tablet, Oral) Active. Triamcinolone Acetonide (External) Specific strength unknown - Active. Medications Reconciled  Social History (Kaitlyn Harper, CMA; 11/11/2018 9:38 AM) Alcohol use Occasional alcohol use. Illicit drug use Recently quit drug use. Tobacco use Current some day smoker.  Family History (Kaitlyn Harper, [place]; 11/11/2018 9:38 AM) Alcohol Abuse Brother, Father. Hypertension Brother, Father.  Pregnancy / Birth History (Kaitlyn Harper, [place]; 11/11/2018 9:38 AM) Age at  menarche 51 years. Age of menopause 46-50 Gravida 3 Irregular periods Maternal age 65-20 Para 3  Other Problems (Kaitlyn Harper, CMA; 11/11/2018 9:38 AM) Diverticulosis High blood pressure     Review of  Systems (Kaitlyn Harper CMA; 11/11/2018 9:38 AM) General Present- Weight Loss. Not Present- Appetite Loss, Chills, Fatigue, Fever, Night Sweats and Weight Gain. Skin Not Present- Change in Wart/Mole, Dryness, Hives, Jaundice, New Lesions, Non-Healing Wounds, Rash and Ulcer. HEENT Present- Wears glasses/contact lenses. Not Present- Earache, Hearing Loss, Hoarseness, Nose Bleed, Oral Ulcers, Ringing in the Ears, Seasonal Allergies, Sinus Pain, Sore Throat, Visual Disturbances and Yellow Eyes. Breast Not Present- Breast Mass, Breast Pain, Nipple Discharge and Skin Changes. Cardiovascular Not Present- Chest Pain, Difficulty Breathing Lying Down, Leg Cramps, Palpitations, Rapid Heart Rate, Shortness of Breath and Swelling of Extremities. Gastrointestinal Present- Change in Bowel Habits. Not Present- Abdominal Pain, Bloating, Bloody Stool, Chronic diarrhea, Constipation, Difficulty Swallowing, Excessive gas, Gets full quickly at meals, Hemorrhoids, Indigestion, Nausea, Rectal Pain and Vomiting. Female Genitourinary Not Present- Frequency, Nocturia, Painful Urination, Pelvic Pain and Urgency.  Vitals (Kaitlyn Harper CMA; 11/11/2018 9:43 AM) 11/11/2018 9:43 AM Weight: 157.13 lb Height: 64in Body Surface Area: 1.77 m Body Mass Index: 26.97 kg/m  Temp.: 97.39F(Oral)  Pulse: 83 (Regular)  P.OX: 99% (Room air) BP: 148/80 (Sitting, Left Arm, Standard)   BP (!) 124/101   Pulse 73   Temp 98.3 F (36.8 C) (Oral)   Resp 17   Ht 5\' 4"  (1.626 m)   Wt 71.7 kg   LMP 12/25/2008   SpO2 100%   BMI 27.12 kg/m       Physical Exam Adin Hector MD; 11/11/2018 10:04 AM)  General Mental Status-Alert. General Appearance-Not in acute distress, Not Sickly.  Orientation-Oriented X3. Hydration-Well hydrated. Voice-Normal.  Integumentary Global Assessment Upon inspection and palpation of skin surfaces of the - Axillae: non-tender, no inflammation or ulceration, no drainage. and Distribution of scalp and body hair is normal. General Characteristics Temperature - normal warmth is noted.  Head and Neck Head-normocephalic, atraumatic with no lesions or palpable masses. Face Global Assessment - atraumatic, no absence of expression. Neck Global Assessment - no abnormal movements, no bruit auscultated on the right, no bruit auscultated on the left, no decreased range of motion, non-tender. Trachea-midline. Thyroid Gland Characteristics - non-tender.  Eye Eyeball - Left-Extraocular movements intact, No Nystagmus - Left. Eyeball - Right-Extraocular movements intact, No Nystagmus - Right. Cornea - Left-No Hazy - Left. Cornea - Right-No Hazy - Right. Sclera/Conjunctiva - Left-No scleral icterus, No Discharge - Left. Sclera/Conjunctiva - Right-No scleral icterus, No Discharge - Right. Pupil - Left-Direct reaction to light normal. Pupil - Right-Direct reaction to light normal.  ENMT Ears Pinna - Left - no drainage observed, no generalized tenderness observed. Pinna - Right - no drainage observed, no generalized tenderness observed. Nose and Sinuses External Inspection of the Nose - no destructive lesion observed. Inspection of the nares - Left - quiet respiration. Inspection of the nares - Right - quiet respiration. Mouth and Throat Lips - Upper Lip - no fissures observed, no pallor noted. Lower Lip - no fissures observed, no pallor noted. Nasopharynx - no discharge present. Oral Cavity/Oropharynx - Tongue - no dryness observed. Oral Mucosa - no cyanosis observed. Hypopharynx - no evidence of airway distress observed.  Chest and Lung Exam Inspection Movements - Normal and Symmetrical. Accessory muscles - No use of  accessory muscles in breathing. Palpation Palpation of the chest reveals - Non-tender. Auscultation Breath sounds - Normal and Clear.  Cardiovascular Auscultation Rhythm - Regular. Murmurs & Other Heart Sounds - Auscultation of the heart reveals - No Murmurs and No Systolic Clicks.   Abdomen soft. Not severely distended. No distasis  recti. No umbilical or other anterior abdominal wall hernias  Female Genitourinary Sexual Maturity Tanner 5 - Adult hair pattern. Note: No vaginal bleeding nor discharge  Peripheral Vascular Upper Extremity Inspection - Left - No Cyanotic nailbeds - Left, Not Ischemic. Inspection - Right - No Cyanotic nailbeds - Right, Not Ischemic.  Neurologic Neurologic evaluation reveals -normal attention span and ability to concentrate, able to name objects and repeat phrases. Appropriate fund of knowledge , normal sensation and normal coordination. Mental Status Affect - not angry, not paranoid. Cranial Nerves-Normal Bilaterally. Gait-Normal.  Neuropsychiatric Mental status exam performed with findings of-able to articulate well with normal speech/language, rate, volume and coherence, thought content normal with ability to perform basic computations and apply abstract reasoning and no evidence of hallucinations, delusions, obsessions or homicidal/suicidal ideation.  Musculoskeletal Global Assessment Spine, Ribs and Pelvis - no instability, subluxation or laxity. Right Upper Extremity - no instability, subluxation or laxity.  Lymphatic Head & Neck  General Head & Neck Lymphatics: Bilateral - Description - No Localized lymphadenopathy. Axillary  General Axillary Region: Bilateral - Description - No Localized lymphadenopathy. Femoral & Inguinal  Generalized Femoral & Inguinal Lymphatics: Left - Description - No Localized lymphadenopathy. Right - Description - No Localized lymphadenopathy.    Assessment & Plan  DIVERTICULITIS  OF LARGE INTESTINE WITH ABSCESS WITHOUT BLEEDING (K57.20) Impression: Pleasant woman with sigmoid abscess presumed to be diverticulitis.  I think she would benefit from robotic sigmoid colectomy since this is a complicated attack with large abscess. She is leaning toward surgery as well. Plannedt 6 weeks from discharge to allow the abscess to be as minimal as possible and minimize the risk of need for colostomy. First week of November  Completed oral Augmentin antibiotic since her abscess and smaller and she is asymptomatic now. If she has recurring symptoms of pain or bowel changes or discomfort, placed back on antibiotics until she gets surgery.  She does have a history of colon polyps. Last colonoscopy 2017. I sent a message to her gastroenterologist, Dr. Oretha Harper, to see if he feels like she needs a repeat colonoscopy prior to surgery or can keep the five-year follow-up as initially recommended (2022).   The anatomy & physiology of the digestive tract was discussed. The pathophysiology of the colon was discussed. Natural history risks without surgery was discussed. I feel the risks of no intervention will lead to serious problems that outweigh the operative risks; therefore, I recommended a partial colectomy to remove the pathology. Minimally invasive (Robotic/Laparoscopic) & open techniques were discussed.  Risks such as bleeding, infection, abscess, leak, reoperation, possible ostomy, hernia, heart attack, death, and other risks were discussed. I noted a good likelihood this will help address the problem. Goals of post-operative recovery were discussed as well. Need for adequate nutrition, daily bowel regimen and healthy physical activity, to optimize recovery was noted as well. We will work to minimize complications. Educational materials were available as well. Questions were answered. The patient expresses understanding & wishes to proceed with surgery.    HISTORY OF  ADENOMATOUS POLYP OF COLON (Z86.010) Impression: She does have a history of colon polyps. 5-year follow-up as initially recommended (2022).  Current Plans Pt Education - Polyps in the Colon and Rectum (Colonic and Rectal Polyps): colonic polyps  Adin Hector, MD, FACS, MASCRS Gastrointestinal and Minimally Invasive Surgery    [address] [place], [place] [address] [phone] Main / Paging [phone] Fax

## 2018-12-17 NOTE — Progress Notes (Signed)
Assisted Dr. Hulan Fray with left, ultrasound guided, transabdominal plane block. Side rails up, monitors on throughout procedure. See vital signs in flow sheet. Tolerated Procedure well.

## 2018-12-17 NOTE — Discharge Instructions (Signed)
SURGERY: POST OP INSTRUCTIONS °(Surgery for small bowel obstruction, colon resection, etc) ° ° °###################################################################### ° °EAT °Gradually transition to a high fiber diet with a fiber supplement over the next few days after discharge ° °WALK °Walk an hour a day.  Control your pain to do that.   ° °CONTROL PAIN °Control pain so that you can walk, sleep, tolerate sneezing/coughing, go up/down stairs. ° °HAVE A BOWEL MOVEMENT DAILY °Keep your bowels regular to avoid problems.  OK to try a laxative to override constipation.  OK to use an antidairrheal to slow down diarrhea.  Call if not better after 2 tries ° °CALL IF YOU HAVE PROBLEMS/CONCERNS °Call if you are still struggling despite following these instructions. °Call if you have concerns not answered by these instructions ° °###################################################################### ° ° °DIET °Follow a light diet the first few days at home.  Start with a bland diet such as soups, liquids, starchy foods, low fat foods, etc.  If you feel full, bloated, or constipated, stay on a ful liquid or pureed/blenderized diet for a few days until you feel better and no longer constipated. °Be sure to drink plenty of fluids every day to avoid getting dehydrated (feeling dizzy, not urinating, etc.). °Gradually add a fiber supplement to your diet over the next week.  Gradually get back to a regular solid diet.  Avoid fast food or heavy meals the first week as you are more likely to get nauseated. °It is expected for your digestive tract to need a few months to get back to normal.  It is common for your bowel movements and stools to be irregular.  You will have occasional bloating and cramping that should eventually fade away.  Until you are eating solid food normally, off all pain medications, and back to regular activities; your bowels will not be normal. °Focus on eating a low-fat, high fiber diet the rest of your life  (See Getting to Good Bowel Health, below). ° °CARE of your INCISION or WOUND °It is good for closed incision and even open wounds to be washed every day.  Shower every day.  Short baths are fine.  Wash the incisions and wounds clean with soap & water.    °If you have a closed incision(s), wash the incision with soap & water every day.  You may leave closed incisions open to air if it is dry.   You may cover the incision with clean gauze & replace it after your daily shower for comfort. °If you have skin tapes (Steristrips) or skin glue (Dermabond) on your incision, leave them in place.  They will fall off on their own like a scab.  You may trim any edges that curl up with clean scissors.  If you have staples, set up an appointment for them to be removed in the office in 10 days after surgery.  °If you have a drain, wash around the skin exit site with soap & water and place a new dressing of gauze or band aid around the skin every day.  Keep the drain site clean & dry.    °If you have an open wound with packing, see wound care instructions.  In general, it is encouraged that you remove your dressing and packing, shower with soap & water, and replace your dressing once a day.  Pack the wound with clean gauze moistened with normal (0.9%) saline to keep the wound moist & uninfected.  Pressure on the dressing for 30 minutes will stop most wound   bleeding.  Eventually your body will heal & pull the open wound closed over the next few months.  °Raw open wounds will occasionally bleed or secrete yellow drainage until it heals closed.  Drain sites will drain a little until the drain is removed.  Even closed incisions can have mild bleeding or drainage the first few days until the skin edges scab over & seal.   °If you have an open wound with a wound vac, see wound vac care instructions. ° ° ° ° °ACTIVITIES as tolerated °Start light daily activities --- self-care, walking, climbing stairs-- beginning the day after surgery.   Gradually increase activities as tolerated.  Control your pain to be active.  Stop when you are tired.  Ideally, walk several times a day, eventually an hour a day.   °Most people are back to most day-to-day activities in a few weeks.  It takes 4-8 weeks to get back to unrestricted, intense activity. °If you can walk 30 minutes without difficulty, it is safe to try more intense activity such as jogging, treadmill, bicycling, low-impact aerobics, swimming, etc. °Save the most intensive and strenuous activity for last (Usually 4-8 weeks after surgery) such as sit-ups, heavy lifting, contact sports, etc.  Refrain from any intense heavy lifting or straining until you are off narcotics for pain control.  You will have off days, but things should improve week-by-week. °DO NOT PUSH THROUGH PAIN.  Let pain be your guide: If it hurts to do something, don't do it.  Pain is your body warning you to avoid that activity for another week until the pain goes down. °You may drive when you are no longer taking narcotic prescription pain medication, you can comfortably wear a seatbelt, and you can safely make sudden turns/stops to protect yourself without hesitating due to pain. °You may have sexual intercourse when it is comfortable. If it hurts to do something, stop. ° °MEDICATIONS °Take your usually prescribed home medications unless otherwise directed.   °Blood thinners:  °Usually you can restart any strong blood thinners after the second postoperative day.  It is OK to take aspirin right away.    ° If you are on strong blood thinners (warfarin/Coumadin, Plavix, Xerelto, Eliquis, Pradaxa, etc), discuss with your surgeon, medicine PCP, and/or cardiologist for instructions on when to restart the blood thinner & if blood monitoring is needed (PT/INR blood check, etc).   ° ° °PAIN CONTROL °Pain after surgery or related to activity is often due to strain/injury to muscle, tendon, nerves and/or incisions.  This pain is usually  short-term and will improve in a few months.  °To help speed the process of healing and to get back to regular activity more quickly, DO THE FOLLOWING THINGS TOGETHER: °1. Increase activity gradually.  DO NOT PUSH THROUGH PAIN °2. Use Ice and/or Heat °3. Try Gentle Massage and/or Stretching °4. Take over the counter pain medication °5. Take Narcotic prescription pain medication for more severe pain ° °Good pain control = faster recovery.  It is better to take more medicine to be more active than to stay in bed all day to avoid medications. °1.  Increase activity gradually °Avoid heavy lifting at first, then increase to lifting as tolerated over the next 6 weeks. °Do not “push through” the pain.  Listen to your body and avoid positions and maneuvers than reproduce the pain.  Wait a few days before trying something more intense °Walking an hour a day is encouraged to help your body recover faster   and more safely.  Start slowly and stop when getting sore.  If you can walk 30 minutes without stopping or pain, you can try more intense activity (running, jogging, aerobics, cycling, swimming, treadmill, sex, sports, weightlifting, etc.) °Remember: If it hurts to do it, then don’t do it! °2. Use Ice and/or Heat °You will have swelling and bruising around the incisions.  This will take several weeks to resolve. °Ice packs or heating pads (6-8 times a day, 30-60 minutes at a time) will help sooth soreness & bruising. °Some people prefer to use ice alone, heat alone, or alternate between ice & heat.  Experiment and see what works best for you.  Consider trying ice for the first few days to help decrease swelling and bruising; then, switch to heat to help relax sore spots and speed recovery. °Shower every day.  Short baths are fine.  It feels good!  Keep the incisions and wounds clean with soap & water.   °3. Try Gentle Massage and/or Stretching °Massage at the area of pain many times a day °Stop if you feel pain - do not  overdo it °4. Take over the counter pain medication °This helps the muscle and nerve tissues become less irritable and calm down faster °Choose ONE of the following over-the-counter anti-inflammatory medications: °Acetaminophen 500mg tabs (Tylenol) 1-2 pills with every meal and just before bedtime (avoid if you have liver problems or if you have acetaminophen in you narcotic prescription) °Naproxen 220mg tabs (ex. Aleve, Naprosyn) 1-2 pills twice a day (avoid if you have kidney, stomach, IBD, or bleeding problems) °Ibuprofen 200mg tabs (ex. Advil, Motrin) 3-4 pills with every meal and just before bedtime (avoid if you have kidney, stomach, IBD, or bleeding problems) °Take with food/snack several times a day as directed for at least 2 weeks to help keep pain / soreness down & more manageable. °5. Take Narcotic prescription pain medication for more severe pain °A prescription for strong pain control is often given to you upon discharge (for example: oxycodone/Percocet, hydrocodone/Norco/Vicodin, or tramadol/Ultram) °Take your pain medication as prescribed. °Be mindful that most narcotic prescriptions contain Tylenol (acetaminophen) as well - avoid taking too much Tylenol. °If you are having problems/concerns with the prescription medicine (does not control pain, nausea, vomiting, rash, itching, etc.), please call us [phone] to see if we need to switch you to a different pain medicine that will work better for you and/or control your side effects better. °If you need a refill on your pain medication, you must call the office before 4 pm and on weekdays only.  By federal law, prescriptions for narcotics cannot be called into a pharmacy.  They must be filled out on paper & picked up from our office by the patient or authorized caretaker.  Prescriptions cannot be filled after 4 pm nor on weekends.   ° °WHEN TO CALL US [phone] °Severe uncontrolled or worsening pain  °Fever over 101 F (38.5 C) °Concerns with  the incision: Worsening pain, redness, rash/hives, swelling, bleeding, or drainage °Reactions / problems with new medications (itching, rash, hives, nausea, etc.) °Nausea and/or vomiting °Difficulty urinating °Difficulty breathing °Worsening fatigue, dizziness, lightheadedness, blurred vision °Other concerns °If you are not getting better after two weeks or are noticing you are getting worse, contact our office [phone] for further advice.  We may need to adjust your medications, re-evaluate you in the office, send you to the emergency room, or see what other things we can do to help. °The   clinic staff is available to answer your questions during regular business hours (8:30am-5pm).  Please don’t hesitate to call and ask to speak to one of our nurses for clinical concerns.    °A surgeon from [facility] is always on call at the hospitals 24 hours/day °If you have a medical emergency, go to the nearest emergency room or call 911. ° °FOLLOW UP in our office °One the day of your discharge from the hospital (or the next business weekday), please call [facility] to set up or confirm an appointment to see your surgeon in the office for a follow-up appointment.  Usually it is 2-3 weeks after your surgery.   °If you have skin staples at your incision(s), let the office know so we can set up a time in the office for the nurse to remove them (usually around 10 days after surgery). °Make sure that you call for appointments the day of discharge (or the next business weekday) from the hospital to ensure a convenient appointment time. °IF YOU HAVE DISABILITY OR FAMILY LEAVE FORMS, BRING THEM TO THE OFFICE FOR PROCESSING.  DO NOT GIVE THEM TO YOUR DOCTOR. ° °[facility] °[address], [address], [place], [place]  [address] ? °[phone] - Main °[phone] - Toll Free,  [phone] - Fax °[email or web address] ° °GETTING TO GOOD BOWEL HEALTH. °It is  expected for your digestive tract to need a few months to get back to normal.  It is common for your bowel movements and stools to be irregular.  You will have occasional bloating and cramping that should eventually fade away.  Until you are eating solid food normally, off all pain medications, and back to regular activities; your bowels will not be normal.   °Avoiding constipation °The goal: ONE SOFT BOWEL MOVEMENT A DAY!    °Drink plenty of fluids.  Choose water first. °TAKE A FIBER SUPPLEMENT EVERY DAY THE REST OF YOUR LIFE °During your first week back home, gradually add back a fiber supplement every day °Experiment which form you can tolerate.   There are many forms such as powders, tablets, wafers, gummies, etc °Psyllium bran (Metamucil), methylcellulose (Citrucel), Miralax or Glycolax, Benefiber, Flax Seed.  °Adjust the dose week-by-week (1/2 dose/day to 6 doses a day) until you are moving your bowels 1-2 times a day.  Cut back the dose or try a different fiber product if it is giving you problems such as diarrhea or bloating. °Sometimes a laxative is needed to help jump-start bowels if constipated until the fiber supplement can help regulate your bowels.  If you are tolerating eating & you are farting, it is okay to try a gentle laxative such as double dose MiraLax, prune juice, or Milk of Magnesia.  Avoid using laxatives too often. °Stool softeners can sometimes help counteract the constipating effects of narcotic pain medicines.  It can also cause diarrhea, so avoid using for too long. °If you are still constipated despite taking fiber daily, eating solids, and a few doses of laxatives, call our office. °Controlling diarrhea °Try drinking liquids and eating bland foods for a few days to avoid stressing your intestines further. °Avoid dairy products (especially milk & ice cream) for a short time.  The intestines often can lose the ability to digest lactose when stressed. °Avoid foods that cause gassiness or  bloating.  Typical foods include beans and other legumes, cabbage, broccoli, and dairy foods.  Avoid greasy, spicy, fast foods.  Every person has   some sensitivity to other foods, so listen to your body and avoid those foods that trigger problems for you. °Probiotics (such as active yogurt, Align, etc) may help repopulate the intestines and colon with normal bacteria and calm down a sensitive digestive tract °Adding a fiber supplement gradually can help thicken stools by absorbing excess fluid and retrain the intestines to act more normally.  Slowly increase the dose over a few weeks.  Too much fiber too soon can backfire and cause cramping & bloating. °It is okay to try and slow down diarrhea with a few doses of antidiarrheal medicines.   °Bismuth subsalicylate (ex. Kayopectate, Pepto Bismol) for a few doses can help control diarrhea.  Avoid if pregnant.   °Loperamide (Imodium) can slow down diarrhea.  Start with one tablet (2mg) first.  Avoid if you are having fevers or severe pain.  °ILEOSTOMY PATIENTS WILL HAVE CHRONIC DIARRHEA since their colon is not in use.    °Drink plenty of liquids.  You will need to drink even more glasses of water/liquid a day to avoid getting dehydrated. °Record output from your ileostomy.  Expect to empty the bag every 3-4 hours at first.  Most people with a permanent ileostomy empty their bag 4-6 times at the least.   °Use antidiarrheal medicine (especially Imodium) several times a day to avoid getting dehydrated.  Start with a dose at bedtime & breakfast.  Adjust up or down as needed.  Increase antidiarrheal medications as directed to avoid emptying the bag more than 8 times a day (every 3 hours). °Work with your wound ostomy nurse to learn care for your ostomy.  See ostomy care instructions. °TROUBLESHOOTING IRREGULAR BOWELS °1) Start with a soft & bland diet. No spicy, greasy, or fried foods.  °2) Avoid gluten/wheat or dairy products from diet to see if symptoms improve. °3) Miralax  17gm or flax seed mixed in 8oz. water or juice-daily. May use 2-4 times a day as needed. °4) Gas-X, Phazyme, etc. as needed for gas & bloating.  °5) Prilosec (omeprazole) over-the-counter as needed °6)  Consider probiotics (Align, Activa, etc) to help calm the bowels down ° °Call your doctor if you are getting worse or not getting better.  Sometimes further testing (cultures, endoscopy, X-ray studies, CT scans, bloodwork, etc.) may be needed to help diagnose and treat the cause of the diarrhea. °[facility] °[address], [address], [place], [place]  [address] °[phone] - Main.    °[phone]  - Toll Free.   [phone] - Fax °[email or web address] ° ° ° °Diverticulosis ° °Diverticulosis is a condition that develops when small pouches (diverticula) form in the wall of the large intestine (colon). The colon is where water is absorbed and stool is formed. The pouches form when the inside layer of the colon pushes through weak spots in the outer layers of the colon. You may have a few pouches or many of them. °What are the causes? °The cause of this condition is not known. °What increases the risk? °The following factors may make you more likely to develop this condition: °· Being older than age 60. Your risk for this condition increases with age. Diverticulosis is rare among people younger than age 30. By age 80, many people have it. °· Eating a low-fiber diet. °· Having frequent constipation. °· Being overweight. °· Not getting enough exercise. °· Smoking. °· Taking over-the-counter pain medicines, like aspirin and ibuprofen. °· Having a family history of diverticulosis. °What are the signs   or symptoms? °In most people, there are no symptoms of this condition. If you do have symptoms, they may include: °· Bloating. °· Cramps in the abdomen. °· Constipation or diarrhea. °· Pain in the lower left side of the abdomen. °How is this diagnosed? °This condition is most often  diagnosed during an exam for other colon problems. Because diverticulosis usually has no symptoms, it often cannot be diagnosed independently. This condition may be diagnosed by: °· Using a flexible scope to examine the colon (colonoscopy). °· Taking an X-ray of the colon after dye has been put into the colon (barium enema). °· Doing a CT scan. °How is this treated? °You may not need treatment for this condition if you have never developed an infection related to diverticulosis. If you have had an infection before, treatment may include: °· Eating a high-fiber diet. This may include eating more fruits, vegetables, and grains. °· Taking a fiber supplement. °· Taking a live bacteria supplement (probiotic). °· Taking medicine to relax your colon. °· Taking antibiotic medicines. °Follow these instructions at home: °· Drink 6-8 glasses of water or more each day to prevent constipation. °· Try not to strain when you have a bowel movement. °· If you have had an infection before: °? Eat more fiber as directed by your health care provider or your diet and nutrition specialist (dietitian). °? Take a fiber supplement or probiotic, if your health care provider approves. °· Take over-the-counter and prescription medicines only as told by your health care provider. °· If you were prescribed an antibiotic, take it as told by your health care provider. Do not stop taking the antibiotic even if you start to feel better. °· Keep all follow-up visits as told by your health care provider. This is important. °Contact a health care provider if: °· You have pain in your abdomen. °· You have bloating. °· You have cramps. °· You have not had a bowel movement in 3 days. °Get help right away if: °· Your pain gets worse. °· Your bloating becomes very bad. °· You have a fever or chills, and your symptoms suddenly get worse. °· You vomit. °· You have bowel movements that are bloody or black. °· You have bleeding from your  rectum. °Summary °· Diverticulosis is a condition that develops when small pouches (diverticula) form in the wall of the large intestine (colon). °· You may have a few pouches or many of them. °· This condition is most often diagnosed during an exam for other colon problems. °· If you have had an infection related to diverticulosis, treatment may include increasing the fiber in your diet, taking supplements, or taking medicines. °This information is not intended to replace advice given to you by your health care provider. Make sure you discuss any questions you have with your health care provider. °Document Released: 10/27/2003 Document Revised: 01/11/2017 Document Reviewed: 12/19/2015 °Elsevier Patient Education © 2020 [employer] ° °

## 2018-12-17 NOTE — Transfer of Care (Signed)
Immediate Anesthesia Transfer of Care Note  Patient: Kaitlyn Harper  Procedure(s) Performed: XI ROBOT ASSISTED LOW ANTERIOR RESECTION, RECTOSIGMOID RESECTION, LYSIS OF ADHESIONS, DRAINING OF PELVIC ABSCESS, RIGID PROCTOSCOPY,COLON RESECTION (N/A )  Patient Location: PACU  Anesthesia Type:General  Level of Consciousness: awake, alert  and oriented  Airway & Oxygen Therapy: Patient Spontanous Breathing and Patient connected to face mask oxygen  Post-op Assessment: Report given to RN and Post -op Vital signs reviewed and stable  Post vital signs: Reviewed and stable  Last Vitals:  Vitals Value Taken Time  BP 130/79 12/17/18 1724  Temp    Pulse 79 12/17/18 1726  Resp 16 12/17/18 1726  SpO2 99 % 12/17/18 1726  Vitals shown include unvalidated device data.  Last Pain:  Vitals:   12/17/18 1056  TempSrc:   PainSc: 0-No pain      Patients Stated Pain Goal: 4 ([id number])  Complications: No apparent anesthesia complications

## 2018-12-17 NOTE — Op Note (Signed)
12/17/2018  5:27 PM  PATIENT:  Kaitlyn Harper  58 y.o. female  Patient Care Team: Debbrah Alar, NP as PCP - General (Internal Medicine) Leo Grosser, Seymour Bars, MD (Inactive) as Consulting Physician (Obstetrics and Gynecology) Milus Banister, MD as Consulting Physician (Gastroenterology) Minus Breeding, MD as Consulting Physician (Cardiology) Michael Boston, MD as Consulting Physician (Colon and Rectal Surgery) Alfonzo Feller, RN as [facility] Management  PRE-OPERATIVE DIAGNOSIS:  DIVERTICULITIS  POST-OPERATIVE DIAGNOSIS:  DIVERTICULITIS WHITH CHRONIC PELVIC ABSCESS  PROCEDURE:   XI ROBOT ASSISTED LOW ANTERIOR RECTOSIGMOID RESECTION ILEAL RESECTION DRAINAGE OF PELVIC ABSCESS ROBOTIC LYSIS OF ADHESIONS ASSESSMENT OF TISSUE PERFUSION USING FIREFLY IMMUNOFLUORESCNECE RIGID PROCTOSCOPY  SURGEON:  Adin Hector, MD  ASSISTANT: Nadeen Landau, MD An experienced assistant was required given the standard of surgical care given the complexity of the case.  This assistant was needed for exposure, dissection, suctioning, retraction, instrument exchange, etc.   ANESTHESIA:     General  TAP block by anesthesia.  See their separate note  Nerve block provided with liposomal bupivacaine (Experel) diluted w NS as a Bilateral TAP block x 35mL each side at the level of the transverse abdominis & preperitoneal spaces along the flank at the anterior axillary line, from subcostal ridge to iliac crest under laparoscopic guidance   Local field block at port sites & extraction wound  EBL:  Total I/O In: 1900 [I.V.:1900] Out: 225 [Urine:125; Blood:100]  Delay start of Pharmacological VTE agent (>24hrs) due to surgical blood loss or risk of bleeding:  no  DRAINS: 19 Fr Blake drain goes to the pelvis  SPECIMEN:   RECTOSIGMOID COLON (open end proximal) DISTAL ANASTOMOTIC RING (final distal margin)  DISPOSITION OF SPECIMEN:  PATHOLOGY  COUNTS:  YES  PLAN  OF CARE: Admit to inpatient   PATIENT DISPOSITION:  PACU - hemodynamically stable.  INDICATION:    Pleasant woman with diverticulitis and abscess improved on antibiotics.  Not amenable to percutaneous drainage in hospital.  Eventually proved and went home.  She had a colonoscopy 2 years ago that was rather underwhelming.  I recommended segmental resection:  The anatomy & physiology of the digestive tract was discussed.  The pathophysiology was discussed.  Natural history risks without surgery was discussed.   I worked to give an overview of the disease and the frequent need to have multispecialty involvement.  I feel the risks of no intervention will lead to serious problems that outweigh the operative risks; therefore, I recommended a partial colectomy to remove the pathology.  Laparoscopic & open techniques were discussed.   Risks such as bleeding, infection, abscess, leak, reoperation, possible ostomy, hernia, heart attack, death, and other risks were discussed.  I noted a good likelihood this will help address the problem.   Goals of post-operative recovery were discussed as well.  We will work to minimize complications.  Educational materials on the pathology had been given in the office.  Questions were answered.    The patient expressed understanding & wished to proceed with surgery.  OR FINDINGS:   Patient had densely inflamed distal ileum adherent to rectosigmoid mesentery and uterus.  At the center of this dense cluster of adhesions was an abscess containing vegetative matter and frank pus.  No massive contamination.  Abscess mostly contained by loop of distal ileum adherent to rectosigmoid mesentery and uterus.  4 x 3 x 3 cm.  Short segment resection of very distal ileum done with immediate anastomosis.  No obvious metastatic disease on  visceral parietal peritoneum or liver.  Rectosigmoid resection with mid descending to mid rectal end-to-end 31 EEA stapled anastomosis The anastomosis  rests 12 cm from the anal verge by rigid proctoscopy.  DESCRIPTION:   Informed consent was confirmed.  The patient and had a TAP block done by anesthesia.  Please see their separate note. The patient underwent general anaesthesia without difficulty.  The patient was positioned appropriately.  VTE prevention in place.  The patient was clipped, prepped, & draped in a sterile fashion.  Surgical timeout confirmed our plan.  The patient was positioned in reverse Trendelenburg.  Abdominal entry was gained using Varess technique at the left subcostal ridge on the anterior abdominal wall.  No elevated EtCO2 noted.  Port placed.  Camera inspection revealed no injury.  Extra ports were carefully placed under direct laparoscopic visualization.  She had had a history of prior open cholecystectomy but fortunately did not have major adhesions in the right upper quadrant.  There were omental adhesions to the rectosigmoid colon as well as small bowel in the pelvis.  However rest of the small intestine seem to be naturally out of the pelvis.  The Intuitive daVinci robot was docked with camera & instruments carefully placed.  I freed off greater omental adhesions to the descending and rectosigmoid colon to help reflected into the upper abdomen.  Freddrick March it attachments to the descending colon up towards the splenic flexure.  Focused down the pelvis.  It is clear that ileum was densely adherent to the uterus and rectosigmoid colon.  I was able to free off some wispy adhesions but then encountered a very dense cluster of adhesions to the ventral surface of the mildly enlarged but not fibroid uterus.  I used a sharp scissors to come through the ileal mesentery.  I was able to find a plane between the rectosigmoid mesentery and the ileum as well eventually free off the rectosigmoid mesentery off the uterus and ileum.  Freed off some lateral attachments to help mobilize the sigmoid colon in a lateral medial fashion as well.  I allow me  to have some tension.  Is able to get into a deep pocket in the lower deeper left pelvis along the rectum.  I then focused on the most dense adhesions between the ileum and uterus.  I ended going into some of the uterus and eventually came into a pocket of pus.  There were pieces of what looked like she would let us in the region as well.  These were carefully removed.  With the abscess cavity breached it better see the planes.  I mobilized the ileocecal mesentery.  It looked like she had already had an appendectomy.  The cecum was up and in the right paracolic gutter.  Mobilized the terminal ileum mesentery off the right posterior pelvis and right lateral pelvis.  Eventually freed off the remaining adhesions to the anterior vagina and reduced the ileum and small bowel the pelvis.  I did inspection and there were very dense inflammatory lesion to the reason.  This seemed to be that the distal ileum mesentery and folded upon itself in many regions.  I carefully freed off some interloop adhesions.  I did not see any frank complete enterotomy but some areas concerning for serosal injury and thinned out mesentery of poor quality.  I turned attention to the rectosigmoid colon since it seemed to be the most obvious source of inflammation.   I was able to better elevate the rectosigmoid mesentery anteriorly.  I scored the base of peritoneum of the medial side of the mesentery of the elevated left colon from the ligament of Treitz to the peritoneal reflection of the mid rectum.   I elevated the sigmoid mesentery and entered into the retro-mesenteric plane. We were able to identify the left ureter and gonadal vessels. We kept those posterior within the retroperitoneum and elevated the left colon mesentery off that. I did isolate the inferior mesenteric artery (IMA) pedicle but did not ligate it yet.  I continued distally and got into the avascular plane posterior to the mesorectum. This allowed me to help mobilize the  rectum as well by freeing the mesorectum off the sacrum.  I mobilized the peritoneal coverings towards the peritoneal reflection on both the right and left sides of the rectum.  I stayed away from the right and left ureters.  I kept the lateral vascular pedicles to the rectum intact.  I skeletonized the lymph nodes off the inferior mesenteric artery pedicle.  I went down to its takeoff from the aorta.   I isolated the inferior mesenteric vein off of the ligament of Treitz just cephalad to that as well.  After confirming the left ureter was out of the way, I went ahead and ligated the inferior mesenteric artery pedicle just 2 cm distal from its takeoff from the aorta.  I did ligate the inferior mesenteric vein in a similar fashion.  We ensured hemostasis.  I mobilized the left colon off the left kidney and retroperitoneum up towards the splenic flexure.  I freed greater my adhesions off the splenic flexure as well.  Without I felt I can get the mid descending colon to reach down to the sacral promontory without difficulty.     I chose a region at the descending/sigmoid junction that was soft and easily reached down to the rectal stump.  I transected the mesentery of the colon radially to preserve remaining colon blood supply.  We asked anesthesia to dilute the indocyanine green (ICG) to 10 mL and inject 3 mL intravenously with IV flush.  I switched to the NIR fluorescence (Firefly mode) imaging window on the daVinci platform.  I was able to see good light green visualization of blood vessels with good perfusion of tissues, confirming good tissue perfusion of both ends (descending colon and rectum) planned for anastomosis.  I skeletonized at the proximal mesorectum and transected at the proximal rectum using a robotic 60 mm stapler x 1 firing.    I created an extraction incision through a small Pfannenstiel incision in the suprapubic region.  Placed a wound protector.  I was able to eviscerate the rectosigmoid  and descending colon out the wound.   I clamped the colon proximal to this area using a reusable pursestringer device.  Passed a 2-0 Keith needle. I transected at the descending/sigmoid junction with a scalpel. I got healthy bleeding mucosa.  We sent the rectosigmoid colon specimen off to go to pathology.  We sized the colon orifice.  I chose a 33 EEA anvil stapler system.  I reinforced the prolene pursestring with interrupted silk suture.  I placed the anvil to the open end of the proximal remaining colon and closed around it using the pursestring.    We did copious irrigation with crystalloid solution.  Hemostasis was good.  The distal end of the remaining colon easily reached down to the rectal stump, therefore, splenic flexure mobilization was not needed.   Eviscerated the ileum and found the very inflamed  distal ileum and chronic abscess cavity.  Ileocecal region not involved.  If no appendix found.  There were areas of serosal injury in numerous locations and thinned out areas at the border with the mesentery.  I did not feel it was safe to return in the abdomen.  Dr. Dema Severin agreed.  Therefore decided to resect this chronically inflamed thinned out area.  I did a side-to-side staple anastomosis of terminal ileum to distal ileum using a 75 GIA stapler.  Stapled off the common defect using a TX 90.  Resected the mesentery with clamps and silk ties.  Close mesenteric defect transversely using interrupted silk sutures that also patched over the [place] staple line to good result.  B[place]-style resection.  Anastomosis laid well and was patent.  It was very close to the ileocecal region but they were distinct.   We did more irrigation to clean the area up saw no purulence or any more particular matter from the abscess cavity.  Dr Dema Severin scrubbed down and did gentle anal dilation and advanced the EEA stapler up the rectal stump. The spike was brought out at the provimal end of the rectal stump under direct  visualization.  I attached the anvil of the proximal colon the spike of the stapler. Anvil was tightened down and held clamped for 60 seconds. The EEA stapler was fired and held clamped for 30 seconds. The stapler was released & removed. We noted 2 excellent anastomotic rings. Blue stitch is in the proximal ring.  Dr Dema Severin did rigid proctoscopy noted the anastomosis was at 12 cm from the anal verge consistent with the mid-rectum.  Anastomosis was snug but no major tension.  We did a final irrigation of antibiotic solution (900 mg clindamycin/240 mg gentamicin in a liter of crystalloid) & held that for the pelvic air leak test .  The rectum was insufflated the rectum while clamping the colon proximal to that anastomosis.  There was a negative air leak test. There was no tension of mesentery or bowel at the anastomosis.   Tissues looked viable.  Ureters & bowel uninjured.  The anastomosis looked healthy.  Because the area had cleaned up and there was no major blood loss and no major risk factors, we did not feel diverting loop ileostomy was warranted.  However we did feel a drain was not unreasonable.  Therefore placed 1 through a right lower quadrant robotic port site with the tip down in the lower pelvis.  Drain secured with 2-0 Prolene suture to skin  Endoluminal gas was evacuated.  Ports & wound protector removed.  We changed gloves & redraped the patient per colon SSI prevention protocol.  We aspirated the antibiotic irrigation.  Hemostasis was good.  Sterile unused instruments were used from this point.  I closed the skin at the port sites using Monocryl stitch and sterile dressing.  I closed the extraction wound using a 0 Vicryl vertical peritoneal closure and a #1 PDS transverse anterior rectal fascial closure like a small Pfannenstiel closure. I closed the skin with some interrupted Monocryl stitches. I placed antibiotic-soaked wicks into the closure at the corners x2.  I placed sterile dressings.      Patient is being extubated go to recovery room. I had discussed postop care with the patient in detail the office & in the holding area. Instructions are written. I discussed operative findings, updated the patient's status, discussed probable steps to recovery, and gave postoperative recommendations to the Patient's daughter, Lowanda Foster.  Recommendations were made.  Questions were answered.  She expressed understanding & appreciation.  Adin Hector, M.D., F.A.C.S. Gastrointestinal and Minimally Invasive Surgery [facility] [address] [place], [place] [address] [phone] Main / Paging

## 2018-12-17 NOTE — Anesthesia Procedure Notes (Signed)

## 2018-12-17 NOTE — Interval H&P Note (Signed)
History and Physical Interval Note:  12/17/2018 12:29 PM  Kaitlyn Harper  has presented today for surgery, with the diagnosis of DIVERTICULITIS.  The various methods of treatment have been discussed with the patient and family. After consideration of risks, benefits and other options for treatment, the patient has consented to  Procedure(s) with comments: XI ROBOT ASSISTED RESECTION OF RECTOSIGMOID COLON, RIGID PROCTOSCOPY (N/A) - BILATERAL TAP BLOCKS as a surgical intervention.  The patient's history has been reviewed, patient examined, no change in status, stable for surgery.  I have reviewed the patient's chart and labs.  Questions were answered to the patient's satisfaction.    I have re-reviewed the the patient's records, history, medications, and allergies.  I have re-examined the patient.  I again discussed intraoperative plans and goals of post-operative recovery.  The patient agrees to proceed.  Kaitlyn Harper  1960-05-29 [id number]  Patient Care Team: Debbrah Alar, NP as PCP - General (Internal Medicine) Leo Grosser Seymour Bars, MD (Inactive) as Consulting Physician (Obstetrics and Gynecology) Milus Banister, MD as Consulting Physician (Gastroenterology) Minus Breeding, MD as Consulting Physician (Cardiology) Michael Boston, MD as Consulting Physician (Colon and Rectal Surgery) Alfonzo Feller, RN as [facility] Management  Patient Active Problem List   Diagnosis Date Noted  . Uterine leiomyoma 10/31/2018  . Depressive disorder 10/20/2018  . Diverticulitis of large intestine with perforation and abscess 10/20/2018  . History of syncope 10/20/2018  . AV block, 1st degree 10/20/2018  . History of adenomatous polyp of colon 10/20/2018  . History of UTI [name] 2018) 10/20/2018  . Anemia   . Anxiety   . Urogenital infection by trichomonas vaginalis 06/13/2017  . Disorder of skin 06/11/2016  . Insomnia 06/11/2016  . NSTEMI (non-ST elevated  myocardial infarction) ([place]) 03/19/2016  . Hypokalemia 03/18/2016  . Tobacco abuse 06/20/2015  . Atypical squamous cells of undetermined significance (ASCUS) on Papanicolaou smear of cervix 12/23/2014  . Intertrigo 09/27/2014  . Left shoulder pain 12/14/2013  . Impaired glucose tolerance 02/12/2013  . Seasonal allergies 05/23/2011  . Hot flashes, menopausal 01/29/2011  . General medical examination 01/29/2011  . Weight gain 12/26/2010  . Vitamin D deficiency 12/26/2010  . Hyperlipidemia 12/26/2010  . Diabetes type 2, controlled ([place]) 12/26/2010  . DOE (dyspnea on exertion) 08/20/2008  . Essential hypertension 08/19/2008  . Irritable bowel syndrome 08/19/2008    Past Medical History:  Diagnosis Date  . Anemia    resolved   . Anxiety   . Bipolar disorder ([place])   . Depression   . Diabetes mellitus without complication ([place])    type 2-diet controlled  . Diverticulitis   . Hyperglycemia   . Hyperlipidemia   . Hypertension   . NSTEMI (non-ST elevated myocardial infarction) ([place]) 2018   reports she fainted at his sons house and they took her to ED  , troponin was elevated and cardiac cath was completed,  no stent placed nor hx of stent intervention , she reports no casue was ever determined for the sycope episode   . Syncope 03/18/2016   reports no recurrence     Past Surgical History:  Procedure Laterality Date  . APPENDECTOMY  1985  . CHOLECYSTECTOMY  1985  . COLONOSCOPY W/ POLYPECTOMY  03/01/2015   Dr Oretha Caprice, [facility].  +adenomatous polyps  . COLONOSCOPY W/ POLYPECTOMY  02/28/2011   4 polyps - 3 TA  . DILATION AND CURETTAGE OF UTERUS  2005  . [place]  2002  .  KNEE SURGERY  1983   arthroscopy?  [name] LEFT HEART CATH AND CORONARY ANGIOGRAPHY N/A 03/19/2016   Procedure: Left Heart Cath and Coronary Angiography;  Surgeon: Peter M Martinique, MD;  Location: [facility];  Service: Cardiovascular;  Laterality: N/A;  . MOUTH SURGERY  03/08/2016  . POLYPECTOMY   2006   ?anal polyp?  Kathee Polite DUCT PROBING  2004  . TUBAL LIGATION  1985    Social History   Socioeconomic History  . Marital status: Single    Spouse name: Not on file  . Number of children: 3  . Years of education: Not on file  . Highest education level: Not on file  Occupational History  . Occupation: Water quality scientist: [employer]  Social Needs  . Financial resource strain: Not on file  . Food insecurity    Worry: Not on file    Inability: Not on file  . Transportation needs    Medical: Not on file    Non-medical: Not on file  Tobacco Use  . Smoking status: Current Some Day Smoker    Packs/day: 0.25    Types: Cigarettes    Last attempt to quit: 07/02/2015    Years since quitting: 3.4  . Smokeless tobacco: Never Used  . Tobacco comment: 10 cigarettes daily; 10-30 reports " one to two cigarettes every now and again"   Substance and Sexual Activity  . Alcohol use: Yes    Alcohol/week: 0.0 standard drinks    Comment: socially  . Drug use: No  . Sexual activity: Yes    Birth control/protection: Post-menopausal  Lifestyle  . Physical activity    Days per week: Not on file    Minutes per session: Not on file  . Stress: Not on file  Relationships  . Social Herbalist on phone: Not on file    Gets together: Not on file    Attends religious service: Not on file    Active member of club or organization: Not on file    Attends meetings of clubs or organizations: Not on file    Relationship status: Not on file  . Intimate partner violence    Fear of current or ex partner: Not on file    Emotionally abused: Not on file    Physically abused: Not on file    Forced sexual activity: Not on file  Other Topics Concern  . Not on file  Social History Narrative   Caffeine use:  2 drinks   Regular exercise: no   Lives with her daughter   3 children    2 grandchildren   1 great grandson    Family History  Problem Relation Age of Onset  .  Hypertension Father   . Sarcoidosis Brother   . Alcohol abuse Brother   . Cirrhosis Brother   . Stroke Maternal Grandmother   . Colon cancer Neg Hx   . Colon polyps Neg Hx   . Esophageal cancer Neg Hx   . Rectal cancer Neg Hx   . Stomach cancer Neg Hx   . Pulmonary embolism Neg Hx     Medications Prior to Admission  Medication Sig Dispense Refill Last Dose  . amLODipine-valsartan (EXFORGE) 10-320 MG tablet TAKE 1 TABLET BY MOUTH ONCE DAILY (Patient taking differently: Take 1 tablet by mouth daily. ) 90 tablet 1 12/16/2018 at Unknown time  . ARIPiprazole (ABILIFY) 10 MG tablet Take 10 mg by mouth daily after  breakfast.   2 12/17/2018 at 0700  . aspirin EC 81 MG tablet Take 81 mg by mouth daily.   12/15/2018  . atorvastatin (LIPITOR) 20 MG tablet TAKE 1 TABLET BY MOUTH ONCE DAILY (Patient taking differently: Take 20 mg by mouth daily after breakfast. ) 90 tablet 1 12/17/2018 at 0700  . carvedilol (COREG) 6.25 MG tablet TAKE 1 TABLET BY MOUTH TWICE DAILY WITH A MEAL (Patient taking differently: Take 6.25 mg by mouth 2 (two) times daily with a meal. ) 180 tablet 1 12/17/2018 at 0700  . lamoTRIgine (LAMICTAL) 100 MG tablet Take 100 mg by mouth 2 (two) times daily.   0 12/17/2018 at 0700  . ondansetron (ZOFRAN ODT) 4 MG disintegrating tablet Take 1 tablet (4 mg total) by mouth every 8 (eight) hours as needed for nausea or vomiting. 20 tablet 0   . saccharomyces boulardii (FLORASTOR) 250 MG capsule Take 1 capsule (250 mg total) by mouth 2 (two) times daily. You can find a probiotic over the counter.   12/16/2018 at Unknown time  . traZODone (DESYREL) 50 MG tablet Take 100-200 mg by mouth at bedtime.   2 12/16/2018 at Unknown time  . acetaminophen (TYLENOL) 325 MG tablet Take 2 tablets (650 mg total) by mouth every 6 (six) hours as needed for mild pain (or Fever >/= 101).   More than a month at Unknown time    Current Facility-Administered Medications  Medication Dose Route Frequency Provider Last Rate  Last Dose  . bupivacaine liposome (EXPAREL) 1.3 % injection 266 mg  20 mL Infiltration Once Michael Boston, MD      . cefoTEtan (CEFOTAN) 2 g in sodium chloride 0.9 % 100 mL IVPB  2 g Intravenous On Call to OR Michael Boston, MD      . clindamycin (CLEOCIN) 900 mg, gentamicin (GARAMYCIN) 240 mg in sodium chloride 0.9 % 1,000 mL for intraperitoneal lavage   Irrigation To OR Michael Boston, MD      . lactated ringers infusion   Intravenous Continuous Ellender, Karyl Kinnier, MD 50 mL/hr at 12/17/18 1057       Allergies  Allergen Reactions  . Sulfonamide Derivatives Hives and Other (See Comments)    Light sensitivity    BP (!) 124/101 (BP Location: Left Arm)   Pulse 73   Temp 98.3 F (36.8 C) (Oral)   Resp 17   Ht 5\' 4"  (1.626 m)   Wt 71.7 kg   LMP 12/25/2008   SpO2 100%   BMI 27.12 kg/m   Labs: Results for orders placed or performed during the hospital encounter of 12/17/18 (from the past 48 hour(s))  Glucose, capillary     Status: Abnormal   Collection Time: 12/17/18 10:59 AM  Result Value Ref Range   Glucose-Capillary 102 (H) 70 - 99 mg/dL   Comment 1 Notify RN    Comment 2 Document in Chart     Imaging / Studies: No results found.   Adin Hector, M.D., F.A.C.S. Gastrointestinal and Minimally Invasive Surgery [facility] [address], [place], [place] [address] [phone] Main / Paging  12/17/2018 12:30 PM    Adin Hector

## 2018-12-18 LAB — CBC
HCT: 36.9 % (ref 36.0–46.0)
Hemoglobin: 12.1 g/dL (ref 12.0–15.0)
MCH: 30.9 pg (ref 26.0–34.0)
MCHC: 32.8 g/dL (ref 30.0–36.0)
MCV: 94.1 fL (ref 80.0–100.0)
Platelets: 315 10*3/uL (ref 150–400)
RBC: 3.92 MIL/uL (ref 3.87–5.11)
RDW: 13.2 % (ref 11.5–15.5)
WBC: 20.3 10*3/uL — ABNORMAL HIGH (ref 4.0–10.5)
nRBC: 0 % (ref 0.0–0.2)

## 2018-12-18 LAB — BASIC METABOLIC PANEL
Anion gap: 12 (ref 5–15)
BUN: 9 mg/dL (ref 6–20)
CO2: 22 mmol/L (ref 22–32)
Calcium: 8.9 mg/dL (ref 8.9–10.3)
Chloride: 101 mmol/L (ref 98–111)
Creatinine, Ser: 1.05 mg/dL — ABNORMAL HIGH (ref 0.44–1.00)
GFR calc Af Amer: >60 mL/min (ref >60)
GFR calc non Af Amer: 58 mL/min — ABNORMAL LOW (ref >60)
Glucose, Bld: 159 mg/dL — ABNORMAL HIGH (ref 70–99)
Potassium: 3.9 mmol/L (ref 3.5–5.1)
Sodium: 135 mmol/L (ref 135–145)

## 2018-12-18 LAB — MAGNESIUM: Magnesium: 1.6 mg/dL — ABNORMAL LOW (ref 1.7–2.4)

## 2018-12-18 MED ORDER — MAGNESIUM SULFATE 2 GM/50ML IV SOLN
2.0000 g | Freq: Once | INTRAVENOUS | Status: AC
  Administered 2018-12-18: 2 g via INTRAVENOUS
  Filled 2018-12-18: qty 50

## 2018-12-18 NOTE — Progress Notes (Signed)
Kaitlyn Harper AN:[id number] 1961/01/20  CARE TEAM:  PCP: Debbrah Alar, NP  Outpatient Care Team: Patient Care Team: Debbrah Alar, NP as PCP - General (Internal Medicine) Leo Grosser Seymour Bars, MD (Inactive) as Consulting Physician (Obstetrics and Gynecology) Milus Banister, MD as Consulting Physician (Gastroenterology) Minus Breeding, MD as Consulting Physician (Cardiology) Michael Boston, MD as Consulting Physician (Colon and Rectal Surgery) Alfonzo Feller, RN as [place] Management  Inpatient Treatment Team: Treatment Team: Attending Provider: Michael Boston, MD; Registered Nurse: Donia Ast, RN; Technician: Leda Quail, NT   Problem List:   Principal Problem:   Sigmoid diverticulitis with abscess s/p robotic LAR rectosigmoid resection 12/17/2018 Active Problems:   Irritable bowel syndrome   Diabetes type 2, controlled ([place])   Anxiety   Insomnia   Diverticulitis large intestine   1 Day Post-Op  12/17/2018  POST-OPERATIVE DIAGNOSIS:  DIVERTICULITIS WHITH CHRONIC PELVIC ABSCESS  PROCEDURE:   XI ROBOT ASSISTED LOW ANTERIOR RECTOSIGMOID RESECTION ILEAL RESECTION DRAINAGE OF PELVIC ABSCESS ROBOTIC LYSIS OF ADHESIONS ASSESSMENT OF TISSUE PERFUSION USING FIREFLY IMMUNOFLUORESCNECE RIGID PROCTOSCOPY  SURGEON:  Adin Hector, MD  Assessment  Doing relatively well  [facility] Stay = 1 days)  Plan:  -Advance diet per ERAS enhance recovery protocol -Stop IV fluids -Remove Foley -Follow-up on pathology -Hypomagnesium-correct -Borderline diabetes.  No hypoglycemia.  Monitor -Hypertension.  Back on home angiotensin inhibitor and diuretic.  As needed backup. -VTE prophylaxis- SCDs, etc -mobilize as tolerated to help recovery  20 minutes spent in review, evaluation, examination, counseling, and coordination of care.  More than 50% of that time was spent in counseling.  12/18/2018    Subjective: (Chief  complaint)  Sore in recovery room.  Better now.  Has gotten up already.  Tolerating liquids.  Objective:  Vital signs:  Vitals:   12/17/18 2220 12/18/18 0138 12/18/18 0444 12/18/18 0550  BP: (!) 127/93 133/73 96/64   Pulse: (!) 102 99 81   Resp: 15 15 16    Temp: 98.6 F (37 C) 99.1 F (37.3 C) 97.8 F (36.6 C)   TempSrc: Oral Oral Oral   SpO2: 100% 100% 95%   Weight:    75.2 kg  Height:        Last BM Date: 12/16/18  Intake/Output   Yesterday:  11/04 0701 - 11/05 0700 In: 2199.7 [P.O.:50; I.V.:2049.7; IV Piggyback:100] Out: 1630 [Urine:1210; Drains:320; Blood:100] This shift:  No intake/output data recorded.  Bowel function:  Flatus: No  BM:  No  19 French Blake pelvic drain: Serosanguinous   Physical Exam:  General: Pt awake/alert/oriented x4 in no acute distress Eyes: PERRL, normal EOM.  Sclera clear.  No icterus Neuro: CN II-XII intact w/o focal sensory/motor deficits. Lymph: No head/neck/groin lymphadenopathy Psych:  No delerium/psychosis/paranoia HENT: Normocephalic, Mucus membranes moist.  No thrush Neck: Supple, No tracheal deviation Chest: No chest wall pain w good excursion CV:  Pulses intact.  Regular rhythm MS: Normal AROM mjr joints.  No obvious deformity  Abdomen: Soft.  Nondistended.  Mildly tender at incisions only.  No evidence of peritonitis.  No incarcerated hernias.  Ext:  No deformity.  No mjr edema.  No cyanosis Skin: No petechiae / purpura  Results:   Cultures: Recent Results (from the past 720 hour(s))  Novel Coronavirus, NAA (Hosp order, Send-out to Ref Lab; TAT 18-24 hrs     Status: None   Collection Time: 12/13/18  6:14 PM   Specimen: Nasopharyngeal Swab; Respiratory  Result Value Ref Range Status  SARS-CoV-2, NAA NOT DETECTED NOT DETECTED Final    Comment: (NOTE) This nucleic acid amplification test was developed and its performance characteristics determined by [employer]. Nucleic acid amplification tests  include PCR and TMA. This test has not been FDA cleared or approved. This test has been authorized by FDA under an Emergency Use Authorization (EUA). This test is only authorized for the duration of time the declaration that circumstances exist justifying the authorization of the emergency use of in vitro diagnostic tests for detection of SARS-CoV-2 virus and/or diagnosis of COVID-19 infection under section 564(b)(1) of the Act, 21 U.S.C. [id number]) (1), unless the authorization is terminated or revoked sooner. When diagnostic testing is negative, the possibility of a false negative result should be considered in the context of a patient's recent exposures and the presence of clinical signs and symptoms consistent with COVID-19. An individual without symptoms of COVID- 19 and who is not shedding SARS-CoV-2 vi rus would expect to have a negative (not detected) result in this assay. Performed At: [facility] [address] [place], [place] [id number] Rush Farmer MD [id number]    [place]  Final    Comment: Performed at [facility], [place] [address] [place], [place] [address]    Labs: Results for orders placed or performed during the hospital encounter of 12/17/18 (from the past 48 hour(s))  Glucose, capillary     Status: Abnormal   Collection Time: 12/17/18 10:59 AM  Result Value Ref Range   Glucose-Capillary 102 (H) 70 - 99 mg/dL   Comment 1 Notify RN    Comment 2 Document in Chart   Glucose, capillary     Status: Abnormal   Collection Time: 12/17/18  5:35 PM  Result Value Ref Range   Glucose-Capillary 159 (H) 70 - 99 mg/dL  Basic metabolic panel     Status: Abnormal   Collection Time: 12/18/18  3:33 AM  Result Value Ref Range   Sodium 135 135 - 145 mmol/L   Potassium 3.9 3.5 - 5.1 mmol/L   Chloride 101 98 - 111 mmol/L   CO2 22 22 - 32 mmol/L   Glucose, Bld 159 (H) 70 - 99 mg/dL   BUN 9 6 - 20 mg/dL   Creatinine, Ser 1.05 (H)  0.44 - 1.00 mg/dL   Calcium 8.9 8.9 - 10.3 mg/dL   GFR calc non Af Amer 58 (L) >60 mL/min   GFR calc Af Amer >60 >60 mL/min   Anion gap 12 5 - 15    Comment: Performed at [facility], [place] [address], [place], [place] [address]  CBC     Status: Abnormal   Collection Time: 12/18/18  3:33 AM  Result Value Ref Range   WBC 20.3 (H) 4.0 - 10.5 K/uL   RBC 3.92 3.87 - 5.11 MIL/uL   Hemoglobin 12.1 12.0 - 15.0 g/dL   HCT 36.9 36.0 - 46.0 %   MCV 94.1 80.0 - 100.0 fL   MCH 30.9 26.0 - 34.0 pg   MCHC 32.8 30.0 - 36.0 g/dL   RDW 13.2 11.5 - 15.5 %   Platelets 315 150 - 400 K/uL   nRBC 0.0 0.0 - 0.2 %    Comment: Performed at [facility], [place] [address], [place], [place] [address]  Magnesium     Status: Abnormal   Collection Time: 12/18/18  3:33 AM  Result Value Ref Range   Magnesium 1.6 (L) 1.7 - 2.4 mg/dL  Comment: Performed at [facility], [place] [address] [place], [place] [address]    Imaging / Studies: No results found.  Medications / Allergies: per chart  Antibiotics: Anti-infectives (From admission, onward)   Start     Dose/Rate Route Frequency Ordered Stop   12/18/18 0200  cefoTEtan (CEFOTAN) 2 g in sodium chloride 0.9 % 100 mL IVPB     2 g 200 mL/hr over 30 Minutes Intravenous Every 12 hours 12/17/18 1848 12/18/18 0232   12/17/18 1400  neomycin (MYCIFRADIN) tablet 1,000 mg  Status:  Discontinued     1,000 mg Oral 3 times per day 12/17/18 1029 12/17/18 1034   12/17/18 1400  metroNIDAZOLE (FLAGYL) tablet 1,000 mg  Status:  Discontinued     1,000 mg Oral 3 times per day 12/17/18 1029 12/17/18 1034   12/17/18 1030  cefoTEtan (CEFOTAN) 2 g in sodium chloride 0.9 % 100 mL IVPB     2 g 200 mL/hr over 30 Minutes Intravenous On call to O.R. 12/17/18 1029 12/17/18 2353   12/17/18 0600  clindamycin (CLEOCIN) 900 mg, gentamicin (GARAMYCIN) 240 mg in sodium chloride 0.9 % 1,000 mL for intraperitoneal lavage  Status:   Discontinued      Irrigation To Surgery 12/16/18 0757 12/17/18 1838        Note: Portions of this report may have been transcribed using voice recognition software. Every effort was made to ensure accuracy; however, inadvertent computerized transcription errors may be present.   Any transcriptional errors that result from this process are unintentional.     Adin Hector, MD, FACS, MASCRS Gastrointestinal and Minimally Invasive Surgery    [address], [place], [place] [address] [phone] Main / Paging [phone] Fax

## 2018-12-19 LAB — SURGICAL PATHOLOGY

## 2018-12-19 MED ORDER — TRAZODONE HCL 100 MG PO TABS
200.0000 mg | ORAL_TABLET | Freq: Every day | ORAL | Status: DC
  Administered 2018-12-19 – 2018-12-21 (×2): 200 mg via ORAL
  Filled 2018-12-19 (×4): qty 2

## 2018-12-19 MED ORDER — LACTATED RINGERS IV BOLUS
1000.0000 mL | Freq: Three times a day (TID) | INTRAVENOUS | Status: AC | PRN
  Administered 2018-12-20: 1000 mL via INTRAVENOUS

## 2018-12-19 MED ORDER — GABAPENTIN 300 MG PO CAPS
300.0000 mg | ORAL_CAPSULE | Freq: Three times a day (TID) | ORAL | Status: DC
  Administered 2018-12-19 – 2018-12-22 (×9): 300 mg via ORAL
  Filled 2018-12-19 (×10): qty 1

## 2018-12-19 MED ORDER — OXYCODONE HCL 5 MG PO TABS
5.0000 mg | ORAL_TABLET | ORAL | Status: DC | PRN
  Administered 2018-12-19 (×3): 5 mg via ORAL
  Administered 2018-12-20: 10 mg via ORAL
  Administered 2018-12-20 – 2018-12-21 (×2): 5 mg via ORAL
  Administered 2018-12-21 – 2018-12-22 (×2): 10 mg via ORAL
  Filled 2018-12-19: qty 2
  Filled 2018-12-19 (×7): qty 1
  Filled 2018-12-19 (×2): qty 2

## 2018-12-19 MED ORDER — METHOCARBAMOL 1000 MG/10ML IJ SOLN
1000.0000 mg | Freq: Four times a day (QID) | INTRAVENOUS | Status: DC | PRN
  Filled 2018-12-19: qty 10

## 2018-12-19 NOTE — Progress Notes (Signed)
Kaitlyn Harper [id number] [birth year]  CARE TEAM:  PCP: Debbrah Alar, NP  Outpatient Care Team: Patient Care Team: Debbrah Alar, NP as PCP - General (Internal Medicine) Leo Grosser Seymour Bars, MD (Inactive) as Consulting Physician (Obstetrics and Gynecology) Milus Banister, MD as Consulting Physician (Gastroenterology) Minus Breeding, MD as Consulting Physician (Cardiology) Michael Boston, MD as Consulting Physician (Colon and Rectal Surgery) Alfonzo Feller, RN as [place] Management  Inpatient Treatment Team: Treatment Team: Attending Provider: Michael Boston, MD; Technician: Leda Quail, NT; Registered Nurse: Heloise Ochoa, RN   Problem List:   Principal Problem:   Sigmoid diverticulitis with abscess s/p robotic LAR rectosigmoid resection 12/17/2018 Active Problems:   Irritable bowel syndrome   Diabetes type 2, controlled ([place])   Anxiety   Insomnia   Diverticulitis large intestine   2 Days Post-Op  12/17/2018  POST-OPERATIVE DIAGNOSIS:  DIVERTICULITIS WHITH CHRONIC PELVIC ABSCESS  PROCEDURE:   XI ROBOT ASSISTED LOW ANTERIOR RECTOSIGMOID RESECTION ILEAL RESECTION DRAINAGE OF PELVIC ABSCESS ROBOTIC LYSIS OF ADHESIONS ASSESSMENT OF TISSUE PERFUSION USING FIREFLY IMMUNOFLUORESCNECE RIGID PROCTOSCOPY  SURGEON:  Adin Hector, MD  Assessment  Doing relatively well  [facility] Stay = 2 days)  Plan:  -Advance diet per ERAS enhance recovery protocol.  Gradually w SB resection & abscess.  IVF bolus PRN.  -improve pain control - increase gabapentin.  Continue tylenol/ice.  Switch to oxycodone.  Robaxin PRN.  IV Dilaudid for final backup  -Follow-up on pathology - benign & c/w diverticulitis  -Hypomagnesium-corrected  -Borderline diabetes.  No hypoglycemia.  Monitor  -Hypertension.  Back on home angiotensin inhibitor and diuretic.  As needed backup.  -VTE prophylaxis- SCDs, etc  -mobilize as tolerated to  help recovery   25 minutes spent in review, evaluation, examination, counseling, and coordination of care.  More than 50% of that time was spent in counseling.  12/19/2018    Subjective: (Chief complaint)  Pain control "okay" Tolerating liquids  Objective:  Vital signs:  Vitals:   12/18/18 0940 12/18/18 1345 12/18/18 2028 12/19/18 0550  BP: 98/62 98/60 99/64  107/64  Pulse: 74 71 77 78  Resp: 18 18 18 18   Temp: 98 F (36.7 C) 98.1 F (36.7 C) 98.3 F (36.8 C) 98.3 F (36.8 C)  TempSrc: Oral Oral Oral Oral  SpO2: 96% 95% 95% 95%  Weight:      Height:        Last BM Date: 12/16/18  Intake/Output   Yesterday:  11/05 0701 - 11/06 0700 In: 1560 [P.O.:1560] Out: 880 [Urine:700; Drains:180] This shift:  No intake/output data recorded.  Bowel function:  Flatus: No  BM:  No  19 French Blake pelvic drain: Serosanguinous   Physical Exam:  General: Pt awake/alert/oriented x4 in no acute distress Eyes: PERRL, normal EOM.  Sclera clear.  No icterus Neuro: CN II-XII intact w/o focal sensory/motor deficits. Lymph: No head/neck/groin lymphadenopathy Psych:  No delerium/psychosis/paranoia HENT: Normocephalic, Mucus membranes moist.  No thrush Neck: Supple, No tracheal deviation Chest: No chest wall pain w good excursion CV:  Pulses intact.  Regular rhythm MS: Normal AROM mjr joints.  No obvious deformity  Abdomen: Soft.  Nondistended.  Mildly tender at incisions only.  No evidence of peritonitis.  No incarcerated hernias.  Ext:  No deformity.  No mjr edema.  No cyanosis Skin: No petechiae / purpura  Results:   SURGICAL PATHOLOGY  CASE: [id number]  PATIENT: Kaitlyn Harper  Surgical Pathology Report      Clinical History: diverticulitis  FINAL MICROSCOPIC DIAGNOSIS:   A. COLON, RECTOSIGMOID, RESECTION:  - Benign colorectal type mucosa with diverticular disease.  - 3 benign lymph nodes (0/3).  - The surgical resection margins are  histologically viable.  - There is no evidence of malignancy.   B. ILEUM, RESECTION:  - Benign small bowel type mucosa with serosal adhesions.   C. COLON, DISTAL RING, EXCISION:  - Benign colorectal type mucosa.     Charlette Hennings DESCRIPTION:   A. Specimen: Rectosigmoid colon  Length: Sigmoid colon is 11 cm in length, proximal third of rectum is  4.5 cm in length  Serosa: Tan-pink, focally roughened, with prominent, indurated fat  wrapping to the serosal side at the distal sigmoid colon.  Contents: Partially filled with green-yellow material  Mucosa/Wall: Wall is thickened to 0.9 cm at the indurated soft tissue.  The mucosa is tan-pink with prominent redundant folding. Several  diverticula are present, including a 1 cm diverticula at the thickened  wall. Perforation is not grossly seen.  Lymph nodes: 3 tan-pink lymph nodes ranging from 0.4 to 0.7 cm are  sampled  Block Summary:  1 = proximal margin en face  2 = distal margin f face  3 = distal sigmoid colon with large diverticulum  4 = additional diverticula  5 = 3 lymph nodes   B. Received fresh and placed in formalin is a 15 cm in length by 2.4 cm  diameter segment of small bowel, received with both ends partially  stapled to 1 another. Serosa is diffusely roughened and hyperemic.  Upon opening, the wall averages 0.2 cm, mucosa is tan-pink with normal  mucosal folding, there are green-yellow contents, and discrete lesions  are not grossly seen. Representative sections are submitted as follows:  Block summary:  1-2 = representative margins, perpendicular sections  3 = midsegment   C. Received in formalin is an unoriented 1 cm in length by 2.2 cm  diameter donut shaped portion of tan-pink to hyperemic mucosa. 1 end is  received partially stapled, the other end is received opened. Discrete  lesions not grossly seen. Representative sections are submitted in 1  cassette. (AK 12/18/2018)     Final Diagnosis performed  by Enid Cutter, MD.  Electronically signed  12/19/2018  Technical and / or Professional components performed at [facility], [place] [address], [place], [place] [address].  Immunohistochemistry Technical component (if applicable) was performed  at [facility]. [address] [place],  [place], [place] [address].  IMMUNOHISTOCHEMISTRY DISCLAIMER (if applicable):  Some of these immunohistochemical stains may have been developed and the  performance characteristics determine by [facility]. Some  may not have been cleared or approved by the U.S. Food and Drug  Administration. The FDA has determined that such clearance or approval  is not necessary. This test is used for clinical purposes. It should not  be regarded as investigational or for research. This laboratory is  certified under the [place]  (CLIA-88) as qualified to perform high complexity clinical laboratory  testing. The controls stained appropriately.   Cultures: Recent Results (from the past 720 hour(s))  Novel Coronavirus, NAA (Hosp order, Send-out to Ref Lab; TAT 18-24 hrs     Status: None   Collection Time: 12/13/18  6:14 PM   Specimen: Nasopharyngeal Swab; Respiratory  Result Value Ref Range Status   SARS-CoV-2, NAA NOT DETECTED NOT DETECTED Final    Comment: (NOTE) This nucleic acid amplification test was developed  and its performance characteristics determined by [employer]. Nucleic acid amplification tests include PCR and TMA. This test has not been FDA cleared or approved. This test has been authorized by FDA under an Emergency Use Authorization (EUA). This test is only authorized for the duration of time the declaration that circumstances exist justifying the authorization of the emergency use of in vitro diagnostic tests for detection of SARS-CoV-2 virus and/or diagnosis of COVID-19 infection under section  564(b)(1) of the Act, 21 U.S.C. [id number]) (1), unless the authorization is terminated or revoked sooner. When diagnostic testing is negative, the possibility of a false negative result should be considered in the context of a patient's recent exposures and the presence of clinical signs and symptoms consistent with COVID-19. An individual without symptoms of COVID- 19 and who is not shedding SARS-CoV-2 vi rus would expect to have a negative (not detected) result in this assay. Performed At: [facility] [address] [place], [place] [id number] Rush Farmer MD [id number]    [place]  Final    Comment: Performed at [facility], [place] [address] [place], [place] [address]    Labs: Results for orders placed or performed during the hospital encounter of 12/17/18 (from the past 48 hour(s))  Glucose, capillary     Status: Abnormal   Collection Time: 12/17/18 10:59 AM  Result Value Ref Range   Glucose-Capillary 102 (H) 70 - 99 mg/dL   Comment 1 Notify RN    Comment 2 Document in Chart   Glucose, capillary     Status: Abnormal   Collection Time: 12/17/18  5:35 PM  Result Value Ref Range   Glucose-Capillary 159 (H) 70 - 99 mg/dL  Basic metabolic panel     Status: Abnormal   Collection Time: 12/18/18  3:33 AM  Result Value Ref Range   Sodium 135 135 - 145 mmol/L   Potassium 3.9 3.5 - 5.1 mmol/L   Chloride 101 98 - 111 mmol/L   CO2 22 22 - 32 mmol/L   Glucose, Bld 159 (H) 70 - 99 mg/dL   BUN 9 6 - 20 mg/dL   Creatinine, Ser 1.05 (H) 0.44 - 1.00 mg/dL   Calcium 8.9 8.9 - 10.3 mg/dL   GFR calc non Af Amer 58 (L) >60 mL/min   GFR calc Af Amer >60 >60 mL/min   Anion gap 12 5 - 15    Comment: Performed at [facility], [place] [address], [place], [place] [address]  CBC     Status: Abnormal   Collection Time: 12/18/18  3:33 AM  Result Value Ref Range   WBC 20.3 (H) 4.0 - 10.5 K/uL   RBC 3.92 3.87 - 5.11 MIL/uL    Hemoglobin 12.1 12.0 - 15.0 g/dL   HCT 36.9 36.0 - 46.0 %   MCV 94.1 80.0 - 100.0 fL   MCH 30.9 26.0 - 34.0 pg   MCHC 32.8 30.0 - 36.0 g/dL   RDW 13.2 11.5 - 15.5 %   Platelets 315 150 - 400 K/uL   nRBC 0.0 0.0 - 0.2 %    Comment: Performed at [facility], [place] [address] [place], [place] [address]  Magnesium     Status: Abnormal   Collection Time: 12/18/18  3:33 AM  Result Value Ref Range   Magnesium 1.6 (L) 1.7 - 2.4 mg/dL    Comment: Performed at [facility], [place] [address], [place], [place] [address]    Imaging /  Studies: No results found.  Medications / Allergies: per chart  Antibiotics: Anti-infectives (From admission, onward)   Start     Dose/Rate Route Frequency Ordered Stop   12/18/18 0200  cefoTEtan (CEFOTAN) 2 g in sodium chloride 0.9 % 100 mL IVPB     2 g 200 mL/hr over 30 Minutes Intravenous Every 12 hours 12/17/18 1848 12/18/18 0232   12/17/18 1400  neomycin (MYCIFRADIN) tablet 1,000 mg  Status:  Discontinued     1,000 mg Oral 3 times per day 12/17/18 1029 12/17/18 1034   12/17/18 1400  metroNIDAZOLE (FLAGYL) tablet 1,000 mg  Status:  Discontinued     1,000 mg Oral 3 times per day 12/17/18 1029 12/17/18 1034   12/17/18 1030  cefoTEtan (CEFOTAN) 2 g in sodium chloride 0.9 % 100 mL IVPB     2 g 200 mL/hr over 30 Minutes Intravenous On call to O.R. 12/17/18 1029 12/17/18 2353   12/17/18 0600  clindamycin (CLEOCIN) 900 mg, gentamicin (GARAMYCIN) 240 mg in sodium chloride 0.9 % 1,000 mL for intraperitoneal lavage  Status:  Discontinued      Irrigation To Surgery 12/16/18 0757 12/17/18 1838        Note: Portions of this report may have been transcribed using voice recognition software. Every effort was made to ensure accuracy; however, inadvertent computerized transcription errors may be present.   Any transcriptional errors that result from this process are unintentional.     Adin Hector, MD, FACS, MASCRS  Gastrointestinal and Minimally Invasive Surgery    [address], [place], [place] [address] [phone] Main / Paging [phone] Fax

## 2018-12-20 DIAGNOSIS — R12 Heartburn: Secondary | ICD-10-CM

## 2018-12-20 MED ORDER — IRBESARTAN 150 MG PO TABS
150.0000 mg | ORAL_TABLET | Freq: Every day | ORAL | Status: DC
  Administered 2018-12-21 – 2018-12-22 (×2): 150 mg via ORAL
  Filled 2018-12-20 (×2): qty 2

## 2018-12-20 MED ORDER — BISMUTH SUBSALICYLATE 262 MG/15ML PO SUSP
30.0000 mL | Freq: Once | ORAL | Status: AC
  Administered 2018-12-20: 30 mL via ORAL
  Filled 2018-12-20: qty 236

## 2018-12-20 MED ORDER — WITCH HAZEL-GLYCERIN EX PADS
1.0000 "application " | MEDICATED_PAD | CUTANEOUS | Status: DC | PRN
  Administered 2018-12-20: 1 via TOPICAL
  Filled 2018-12-20: qty 100

## 2018-12-20 MED ORDER — BISMUTH SUBSALICYLATE 262 MG/15ML PO SUSP
30.0000 mL | Freq: Three times a day (TID) | ORAL | Status: DC | PRN
  Administered 2018-12-20 – 2018-12-21 (×2): 30 mL via ORAL
  Filled 2018-12-20: qty 236

## 2018-12-20 MED ORDER — PSYLLIUM 95 % PO PACK
1.0000 | PACK | Freq: Two times a day (BID) | ORAL | Status: DC
  Administered 2018-12-20 – 2018-12-22 (×4): 1 via ORAL
  Filled 2018-12-20 (×5): qty 1

## 2018-12-20 MED ORDER — PANTOPRAZOLE SODIUM 40 MG PO TBEC
40.0000 mg | DELAYED_RELEASE_TABLET | Freq: Two times a day (BID) | ORAL | Status: DC
  Administered 2018-12-20 – 2018-12-22 (×4): 40 mg via ORAL
  Filled 2018-12-20 (×4): qty 1

## 2018-12-20 NOTE — Plan of Care (Signed)
Patient lying in bed this morning; pain controlled at this time. No needs expressed. Will continue to monitor.  

## 2018-12-20 NOTE — Progress Notes (Addendum)
[name] [birth year]  CARE TEAM:  PCP: Debbrah Alar, NP  Outpatient Care Team: Patient Care Team: Debbrah Alar, NP as PCP - General (Internal Medicine) Leo Grosser Seymour Bars, MD (Inactive) as Consulting Physician (Obstetrics and Gynecology) Milus Banister, MD as Consulting Physician (Gastroenterology) Minus Breeding, MD as Consulting Physician (Cardiology) Michael Boston, MD as Consulting Physician (Colon and Rectal Surgery) Alfonzo Feller, RN as [place] Management  Inpatient Treatment Team: Treatment Team: Attending Provider: Michael Boston, MD; Technician: Leda Quail, NT; Registered Nurse: Heloise Ochoa, RN; Registered Nurse: Petra Kuba, RN   Problem List:   Principal Problem:   Sigmoid diverticulitis with abscess s/p robotic LAR rectosigmoid resection 12/17/2018 Active Problems:   Irritable bowel syndrome   Diabetes type 2, controlled ([place])   Anxiety   Insomnia   Diverticulitis large intestine   Heartburn   3 Days Post-Op  12/17/2018  POST-OPERATIVE DIAGNOSIS:  DIVERTICULITIS WHITH CHRONIC PELVIC ABSCESS  PROCEDURE:   XI ROBOT ASSISTED LOW ANTERIOR RECTOSIGMOID RESECTION ILEAL RESECTION DRAINAGE OF PELVIC ABSCESS ROBOTIC LYSIS OF ADHESIONS ASSESSMENT OF TISSUE PERFUSION USING FIREFLY IMMUNOFLUORESCNECE RIGID PROCTOSCOPY  SURGEON:  Adin Hector, MD  Assessment  OK  [facility] Stay = 3 days)  Plan:  -Advance diet per ERAS enhance recovery protocol.  Heart healthy diet   -improved pain control -  Continue tylenol/ice/gabapentin.  Oxycodone & Robaxin PRN.  IV Dilaudid for final backup  Diarrhea and overflow incontinence most likely due to chronic stricturing diverticulitis causing post obstructive diarrhea.  Pepto x1 now & PRN.  Psyllium to help thicken up stools.  Not a major issue in the past.  Should resolve - follow  -Pathology - benign & c/w diverticulitis   -Hypomagnesium-corrected  -Borderline diabetes.  No hypoglycemia.  Monitor  Persistent heartburn despite Maalox & Protonix last night.  Not so much now.  Add Carafate as needed.  Was not an issue at home according to her.  Hopefully will resolve since ileus is resolving  -Hypertension.  Back on home angiotensin inhibitor and diuretic.  Cut in half if stays soft on her blood pressure as needed .  PRN backup.  -VTE prophylaxis- SCDs, etc  -mobilize as tolerated to help recovery  D/C patient from hospital when patient meets criteria (anticipate in 1-2 day(s)):  Tolerating oral intake well Ambulating well Adequate pain control without IV medications Urinating  Having flatus Disposition planning in place    25 minutes spent in review, evaluation, examination, counseling, and coordination of care.  More than 50% of that time was spent in counseling.  I updated the patient's status to the patient and nurse.  Recommendations were made.  Questions were answered.  They expressed understanding & appreciation.   12/20/2018    Subjective: (Chief complaint)  Had heartburn again last night that disappointed/frustrated her.  Feeling better this morning.  Bowels loose but under better control.  Pain under better control.  Walking more.  Does not feel ready to go home  Objective:  Vital signs:  Vitals:   12/19/18 2141 12/20/18 0700 12/20/18 0711 12/20/18 0714  BP: 127/79 129/80    Pulse: (!) 110  (!) 110   Resp: 18 14    Temp: 99.3 F (37.4 C) 98.4 F (36.9 C)    TempSrc: Oral Oral    SpO2: 100% 97%    Weight:    73.4 kg  Height:        Last BM Date: 12/16/18  Intake/Output   Yesterday:  11/06 0701 - 11/07 0700 In: 360 [P.O.:360] Out: 1280 [Urine:800; Drains:480] This shift:  No intake/output data recorded.  Bowel function:  Flatus: YES  BM:  YES  19 French Blake pelvic drain: Serosanguinous   Physical Exam:  General: Pt awake/alert/oriented x4 in no  acute distress Eyes: PERRL, normal EOM.  Sclera clear.  No icterus Neuro: CN II-XII intact w/o focal sensory/motor deficits. Lymph: No head/neck/groin lymphadenopathy Psych:  No delerium/psychosis/paranoia.  Mildly anxious but consolable. HENT: Normocephalic, Mucus membranes moist.  No thrush Neck: Supple, No tracheal deviation Chest: No chest wall pain w good excursion CV:  Pulses intact.  Regular rhythm MS: Normal AROM mjr joints.  No obvious deformity  Abdomen: Soft.  Nondistended.  Mildly tender at incisions only.  No evidence of peritonitis.  No incarcerated hernias.  Ext:  No deformity.  No mjr edema.  No cyanosis Skin: No petechiae / purpura  Results:   SURGICAL PATHOLOGY  CASE: [id number]  PATIENT: Kaitlyn Harper  Surgical Pathology Report      Clinical History: diverticulitis      FINAL MICROSCOPIC DIAGNOSIS:   A. COLON, RECTOSIGMOID, RESECTION:  - Benign colorectal type mucosa with diverticular disease.  - 3 benign lymph nodes (0/3).  - The surgical resection margins are histologically viable.  - There is no evidence of malignancy.   B. ILEUM, RESECTION:  - Benign small bowel type mucosa with serosal adhesions.   C. COLON, DISTAL RING, EXCISION:  - Benign colorectal type mucosa.     Deloyd Handy DESCRIPTION:   A. Specimen: Rectosigmoid colon  Length: Sigmoid colon is 11 cm in length, proximal third of rectum is  4.5 cm in length  Serosa: Tan-pink, focally roughened, with prominent, indurated fat  wrapping to the serosal side at the distal sigmoid colon.  Contents: Partially filled with green-yellow material  Mucosa/Wall: Wall is thickened to 0.9 cm at the indurated soft tissue.  The mucosa is tan-pink with prominent redundant folding. Several  diverticula are present, including a 1 cm diverticula at the thickened  wall. Perforation is not grossly seen.  Lymph nodes: 3 tan-pink lymph nodes ranging from 0.4 to 0.7 cm are  sampled  Block Summary:   1 = proximal margin en face  2 = distal margin f face  3 = distal sigmoid colon with large diverticulum  4 = additional diverticula  5 = 3 lymph nodes   B. Received fresh and placed in formalin is a 15 cm in length by 2.4 cm  diameter segment of small bowel, received with both ends partially  stapled to 1 another. Serosa is diffusely roughened and hyperemic.  Upon opening, the wall averages 0.2 cm, mucosa is tan-pink with normal  mucosal folding, there are green-yellow contents, and discrete lesions  are not grossly seen. Representative sections are submitted as follows:  Block summary:  1-2 = representative margins, perpendicular sections  3 = midsegment   C. Received in formalin is an unoriented 1 cm in length by 2.2 cm  diameter donut shaped portion of tan-pink to hyperemic mucosa. 1 end is  received partially stapled, the other end is received opened. Discrete  lesions not grossly seen. Representative sections are submitted in 1  cassette. (AK 12/18/2018)     Final Diagnosis performed by Enid Cutter, MD.  Electronically signed  12/19/2018  Technical and / or Professional components performed at [facility], [place] [address], [place],  [address].  Immunohistochemistry Technical component (if applicable) was performed  at [facility]. [address], [place],  [place], [place] [address].  IMMUNOHISTOCHEMISTRY DISCLAIMER (if applicable):  Some of these immunohistochemical stains may have been developed and the  performance characteristics determine by [facility]. Some  may not have been cleared or approved by the U.S. Food and Drug  Administration. The FDA has determined that such clearance or approval  is not necessary. This test is used for clinical purposes. It should not  be regarded as investigational or for research. This laboratory is  certified under the [place]  (CLIA-88) as qualified to perform high complexity clinical laboratory  testing. The controls stained appropriately.   Cultures: Recent Results (from the past 720 hour(s))  Novel Coronavirus, NAA (Hosp order, Send-out to Ref Lab; TAT 18-24 hrs     Status: None   Collection Time: 12/13/18  6:14 PM   Specimen: Nasopharyngeal Swab; Respiratory  Result Value Ref Range Status   SARS-CoV-2, NAA NOT DETECTED NOT DETECTED Final    Comment: (NOTE) This nucleic acid amplification test was developed and its performance characteristics determined by [employer]. Nucleic acid amplification tests include PCR and TMA. This test has not been FDA cleared or approved. This test has been authorized by FDA under an Emergency Use Authorization (EUA). This test is only authorized for the duration of time the declaration that circumstances exist justifying the authorization of the emergency use of in vitro diagnostic tests for detection of SARS-CoV-2 virus and/or diagnosis of COVID-19 infection under section 564(b)(1) of the Act, 21 U.S.C. GF:7541899) (1), unless the authorization is terminated or revoked sooner. When diagnostic testing is negative, the possibility of a false negative result should be considered in the context of a patient's recent exposures and the presence of clinical signs and symptoms consistent with COVID-19. An individual without symptoms of COVID- 19 and who is not shedding SARS-CoV-2 vi rus would expect to have a negative (not detected) result in this assay. Performed At: [facility] [address] [place], [place] [id number] Rush Farmer MD [id number]    [place]  Final    Comment: Performed at [facility], [place] [address], [place],  [address]    Labs: No results found for this or any previous visit (from the past 48 hour(s)).  Imaging / Studies: No results found.  Medications / Allergies: per chart   Antibiotics: Anti-infectives (From admission, onward)   Start     Dose/Rate Route Frequency Ordered Stop   12/18/18 0200  cefoTEtan (CEFOTAN) 2 g in sodium chloride 0.9 % 100 mL IVPB     2 g 200 mL/hr over 30 Minutes Intravenous Every 12 hours 12/17/18 1848 12/18/18 0232   12/17/18 1400  neomycin (MYCIFRADIN) tablet 1,000 mg  Status:  Discontinued     1,000 mg Oral 3 times per day 12/17/18 1029 12/17/18 1034   12/17/18 1400  metroNIDAZOLE (FLAGYL) tablet 1,000 mg  Status:  Discontinued     1,000 mg Oral 3 times per day 12/17/18 1029 12/17/18 1034   12/17/18 1030  cefoTEtan (CEFOTAN) 2 g in sodium chloride 0.9 % 100 mL IVPB     2 g 200 mL/hr over 30 Minutes Intravenous On call to O.R. 12/17/18 1029 12/17/18 2353   12/17/18 0600  clindamycin (CLEOCIN) 900 mg, gentamicin (GARAMYCIN) 240 mg in sodium chloride 0.9 % 1,000 mL for intraperitoneal lavage  Status:  Discontinued      Irrigation To Surgery 12/16/18 0757 12/17/18 1838  Note: Portions of this report may have been transcribed using voice recognition software. Every effort was made to ensure accuracy; however, inadvertent computerized transcription errors may be present.   Any transcriptional errors that result from this process are unintentional.     Adin Hector, MD, FACS, MASCRS Gastrointestinal and Minimally Invasive Surgery    [address] [place], [place] [address] [phone] Main / Paging [phone] Fax

## 2018-12-21 LAB — BASIC METABOLIC PANEL
Anion gap: 7 (ref 5–15)
BUN: 20 mg/dL (ref 6–20)
CO2: 27 mmol/L (ref 22–32)
Calcium: 7.9 mg/dL — ABNORMAL LOW (ref 8.9–10.3)
Chloride: 99 mmol/L (ref 98–111)
Creatinine, Ser: 1.01 mg/dL — ABNORMAL HIGH (ref 0.44–1.00)
GFR calc Af Amer: >60 mL/min (ref >60)
GFR calc non Af Amer: >60 mL/min (ref >60)
Glucose, Bld: 107 mg/dL — ABNORMAL HIGH (ref 70–99)
Potassium: 3.9 mmol/L (ref 3.5–5.1)
Sodium: 133 mmol/L — ABNORMAL LOW (ref 135–145)

## 2018-12-21 LAB — CBC
HCT: 32.7 % — ABNORMAL LOW (ref 36.0–46.0)
Hemoglobin: 10.4 g/dL — ABNORMAL LOW (ref 12.0–15.0)
MCH: 30.6 pg (ref 26.0–34.0)
MCHC: 31.8 g/dL (ref 30.0–36.0)
MCV: 96.2 fL (ref 80.0–100.0)
Platelets: 300 10*3/uL (ref 150–400)
RBC: 3.4 MIL/uL — ABNORMAL LOW (ref 3.87–5.11)
RDW: 13.6 % (ref 11.5–15.5)
WBC: 11.5 10*3/uL — ABNORMAL HIGH (ref 4.0–10.5)
nRBC: 0 % (ref 0.0–0.2)

## 2018-12-21 LAB — MAGNESIUM: Magnesium: 2.2 mg/dL (ref 1.7–2.4)

## 2018-12-21 MED ORDER — METOCLOPRAMIDE HCL 5 MG/ML IJ SOLN: 5.0000 mg | Freq: Three times a day (TID) | INTRAMUSCULAR | Status: DC | PRN

## 2018-12-21 MED ORDER — LACTATED RINGERS IV BOLUS: 1000.0000 mL | Freq: Three times a day (TID) | INTRAVENOUS | Status: DC | PRN

## 2018-12-21 MED ORDER — SUCRALFATE 1 GM/10ML PO SUSP: 1.0000 g | Freq: Four times a day (QID) | ORAL | Status: DC | PRN

## 2018-12-21 NOTE — Plan of Care (Signed)
Patient lying in bed this morning; pain controlled at this time. No needs expressed. Will continue to monitor.  

## 2018-12-22 MED ORDER — DEXAMETHASONE SODIUM PHOSPHATE 4 MG/ML IJ SOLN
INTRAMUSCULAR | Status: DC | PRN
  Administered 2018-12-17 (×2): 4 mg via PERINEURAL

## 2018-12-22 MED ORDER — ROPIVACAINE HCL 5 MG/ML IJ SOLN
INTRAMUSCULAR | Status: DC | PRN
  Administered 2018-12-17 (×2): 20 mL via PERINEURAL

## 2018-12-22 MED ORDER — OXYCODONE HCL 5 MG PO TABS: 5.0000 mg | ORAL_TABLET | ORAL | 0 refills | Status: DC | PRN

## 2018-12-22 MED ORDER — PANTOPRAZOLE SODIUM 40 MG PO TBEC: 40.0000 mg | DELAYED_RELEASE_TABLET | Freq: Every day | ORAL | 2 refills | Status: DC

## 2018-12-22 MED FILL — oxyCODONE HCL 5 MG TABS: 5 | 3 days supply | Qty: 30 | Fill #0

## 2018-12-22 MED FILL — PANTOPRAZOLE SOD DR 40 MG T: 40 | 14 days supply | Qty: 14 | Fill #0

## 2018-12-22 NOTE — Progress Notes (Signed)
Discharge instructions given to patient. Patient had no questions. NT or writer will wheel patient out once family comes in to pick her up

## 2018-12-22 NOTE — Anesthesia Postprocedure Evaluation (Signed)
Anesthesia Post Note  Patient: Kaitlyn Harper  Procedure(s) Performed: XI ROBOT ASSISTED LOW ANTERIOR RESECTION, RECTOSIGMOID RESECTION, LYSIS OF ADHESIONS, DRAINING OF PELVIC ABSCESS, RIGID PROCTOSCOPY,COLON RESECTION (N/A )     Patient location during evaluation: PACU Anesthesia Type: Regional and General Level of consciousness: awake and alert Pain management: pain level controlled Vital Signs Assessment: post-procedure vital signs reviewed and stable Respiratory status: spontaneous breathing, nonlabored ventilation, respiratory function stable and patient connected to nasal cannula oxygen Cardiovascular status: blood pressure returned to baseline and stable Postop Assessment: no apparent nausea or vomiting Anesthetic complications: no    Last Vitals:  Vitals:   12/21/18 2120 12/22/18 0629  BP: 112/71 107/63  Pulse: 76 73  Resp: 18 18  Temp: 36.8 C 37.1 C  SpO2: 97% 97%    Last Pain:  Vitals:   12/22/18 0835  TempSrc:   PainSc: 3                  Kaitlyn Harper

## 2018-12-22 NOTE — Anesthesia Procedure Notes (Signed)
Anesthesia Regional Block: TAP block   Pre-Anesthetic Checklist: ,, timeout performed, Correct Patient, Correct Site, Correct Laterality, Correct Procedure, Correct Position, site marked, Risks and benefits discussed,  Surgical consent,  Pre-op evaluation,  At surgeon's request and post-op pain management  Laterality: Right  Prep: Maximum Sterile Barrier Precautions used, chloraprep       Needles:  Injection technique: Single-shot  Needle Type: Echogenic Stimulator Needle     Needle Length: 9cm  Needle Gauge: 22     Additional Needles:   Procedures:,,,, ultrasound used (permanent image in chart),,,,  Narrative:  Start time: 12/17/2018 1:30 PM End time: 12/17/2018 1:40 PM Injection made incrementally with aspirations every 5 mL.  Performed by: Personally  Anesthesiologist: Freddrick March, MD  Additional Notes: Monitors applied. No increased pain on injection. No increased resistance to injection. Injection made in 5cc increments. Good needle visualization. Patient tolerated procedure well.

## 2018-12-22 NOTE — Anesthesia Procedure Notes (Addendum)
Anesthesia Regional Block: TAP block   Pre-Anesthetic Checklist: ,, timeout performed, Correct Patient, Correct Site, Correct Laterality, Correct Procedure, Correct Position, site marked, Risks and benefits discussed,  Surgical consent,  Pre-op evaluation,  At surgeon's request and post-op pain management  Laterality: Left  Prep: Maximum Sterile Barrier Precautions used, chloraprep       Needles:  Injection technique: Single-shot  Needle Type: Echogenic Stimulator Needle     Needle Length: 9cm  Needle Gauge: 22     Additional Needles:   Procedures:,,,, ultrasound used (permanent image in chart),,,,  Narrative:  Start time: 12/17/2018 1:20 PM End time: 12/17/2018 1:30 PM Injection made incrementally with aspirations every 5 mL.  Performed by: Personally  Anesthesiologist: Freddrick March, MD  Additional Notes: Monitors applied. No increased pain on injection. No increased resistance to injection. Injection made in 5cc increments. Good needle visualization. Patient tolerated procedure well.

## 2018-12-22 NOTE — Discharge Summary (Signed)
Physician Discharge Summary    Patient ID: Kaitlyn Harper MRN: [id number] DOB/AGE: 16-Jan-1961  58 y.o.  Patient Care Team: Debbrah Alar, NP as PCP - General (Internal Medicine) Leo Grosser Seymour Bars, MD (Inactive) as Consulting Physician (Obstetrics and Gynecology) Milus Banister, MD as Consulting Physician (Gastroenterology) Minus Breeding, MD as Consulting Physician (Cardiology) Michael Boston, MD as Consulting Physician (Colon and Rectal Surgery) Alfonzo Feller, RN as [place] date: 12/17/2018  Discharge date: 12/22/2018  Hospital Stay = 5 days    Discharge Diagnoses:  Principal Problem:   Sigmoid diverticulitis with abscess s/p robotic LAR rectosigmoid resection 12/17/2018 Active Problems:   Irritable bowel syndrome   Diabetes type 2, controlled ([place])   Anxiety   Insomnia   Diverticulitis large intestine   Heartburn   5 Days Post-Op  12/17/2018  POST-OPERATIVE DIAGNOSIS:   DIVERTICULITIS WHITH CHRONIC PELVIC ABSCESS  SURGERY:  12/17/2018  Procedure(s): XI ROBOT ASSISTED LOW ANTERIOR RESECTION, RECTOSIGMOID RESECTION, LYSIS OF ADHESIONS, DRAINING OF PELVIC ABSCESS, RIGID PROCTOSCOPY,COLON RESECTION  SURGEON:    Surgeon(s): Michael Boston, MD Ileana Roup, MD  Consults: None  Hospital Course:   The patient underwent the surgery above.  Postoperatively, the patient gradually mobilized and advanced to a solid diet.  Pain and other symptoms were treated aggressively.  She struggled with heartburn but seemed to have improvement once ileus had resolved and taking pantoprazole.  Pathology came back consistent with diverticulitis.  Patient updated with good news.  By the time of discharge, the patient was walking well the hallways, eating food, having flatus.  Pain was well-controlled on an oral medications.  Based on meeting discharge criteria and continuing to recover, I felt it was safe for the patient to be  discharged from the hospital to further recover with close followup. Postoperative recommendations were discussed in detail.  They are written as well.  Discharged Condition: good  Discharge Exam: Blood pressure 107/63, pulse 73, temperature 98.7 F (37.1 C), temperature source Oral, resp. rate 18, height 5\' 4"  (1.626 m), weight 72.3 kg, last menstrual period 12/25/2008, SpO2 97 %.  General: Pt awake/alert/oriented x4 in No acute distress Eyes: PERRL, normal EOM.  Sclera clear.  No icterus Neuro: CN II-XII intact w/o focal sensory/motor deficits. Lymph: No head/neck/groin lymphadenopathy Psych:  No delerium/psychosis/paranoia HENT: Normocephalic, Mucus membranes moist.  No thrush Neck: Supple, No tracheal deviation Chest: No chest wall pain w good excursion CV:  Pulses intact.  Regular rhythm MS: Normal AROM mjr joints.  No obvious deformity Abdomen: Soft.  Nondistended.  Nontender.  No evidence of peritonitis.  No incarcerated hernias. Ext:  SCDs BLE.  No mjr edema.  No cyanosis Skin: No petechiae / purpura   Disposition:   Follow-up Information    Michael Boston, MD. Schedule an appointment as soon as possible for a visit in 3 weeks.   Specialty: General Surgery Why: To follow up after your operation, To follow up after your hospital stay Contact information: [place] [address] [phone]           Discharge disposition: 01-Home or Self Care       Discharge Instructions    Call MD for:   Complete by: As directed    FEVER > 101.5 F  (temperatures < 101.5 F are not significant)   Call MD for:  extreme fatigue   Complete by: As directed    Call MD for:  persistant dizziness or light-headedness  Complete by: As directed    Call MD for:  persistant nausea and vomiting   Complete by: As directed    Call MD for:  redness, tenderness, or signs of infection (pain, swelling, redness, odor or green/yellow discharge around incision site)    Complete by: As directed    Call MD for:  severe uncontrolled pain   Complete by: As directed    Diet - low sodium heart healthy   Complete by: As directed    Start with a bland diet such as soups, liquids, starchy foods, low fat foods, etc. the first few days at home. Gradually advance to a solid, low-fat, high fiber diet by the end of the first week at home.   Add a fiber supplement to your diet (Metamucil, etc) If you feel full, bloated, or constipated, stay on a full liquid or pureed/blenderized diet for a few days until you feel better and are no longer constipated.   Discharge instructions   Complete by: As directed    See Discharge Instructions If you are not getting better after two weeks or are noticing you are getting worse, contact our office [phone] for further advice.  We may need to adjust your medications, re-evaluate you in the office, send you to the emergency room, or see what other things we can do to help. The clinic staff is available to answer your questions during regular business hours (8:30am-5pm).  Please don't hesitate to call and ask to speak to one of our nurses for clinical concerns.    A surgeon from [facility] is always on call at the hospitals 24 hours/day If you have a medical emergency, go to the nearest emergency room or call 911.   Discharge wound care:   Complete by: As directed    It is good for closed incisions and even open wounds to be washed every day.  Shower every day.  Short baths are fine.  Wash the incisions and wounds clean with soap & water.    You may leave closed incisions open to air if it is dry.   You may cover the incision with clean gauze & replace it after your daily shower for comfort.   Driving Restrictions   Complete by: As directed    You may drive when: - you are no longer taking narcotic prescription pain medication - you can comfortably wear a seatbelt - you can safely make sudden turns/stops without  pain.   Increase activity slowly   Complete by: As directed    Start light daily activities --- self-care, walking, climbing stairs- beginning the day after surgery.  Gradually increase activities as tolerated.  Control your pain to be active.  Stop when you are tired.  Ideally, walk several times a day, eventually an hour a day.   Most people are back to most day-to-day activities in a few weeks.  It takes 4-6 weeks to get back to unrestricted, intense activity. If you can walk 30 minutes without difficulty, it is safe to try more intense activity such as jogging, treadmill, bicycling, low-impact aerobics, swimming, etc. Save the most intensive and strenuous activity for last (Usually 4-8 weeks after surgery) such as sit-ups, heavy lifting, contact sports, etc.  Refrain from any intense heavy lifting or straining until you are off narcotics for pain control.  You will have off days, but things should improve week-by-week. DO NOT PUSH THROUGH PAIN.  Let pain be your guide: If it hurts to do  something, don't do it.   Lifting restrictions   Complete by: As directed    If you can walk 30 minutes without difficulty, it is safe to try more intense activity such as jogging, treadmill, bicycling, low-impact aerobics, swimming, etc. Save the most intensive and strenuous activity for last (Usually 4-8 weeks after surgery) such as sit-ups, heavy lifting, contact sports, etc.   Refrain from any intense heavy lifting or straining until you are off narcotics for pain control.  You will have off days, but things should improve week-by-week. DO NOT PUSH THROUGH PAIN.  Let pain be your guide: If it hurts to do something, don't do it.  Pain is your body warning you to avoid that activity for another week until the pain goes down.   May shower / Bathe   Complete by: As directed    May walk up steps   Complete by: As directed    Remove dressing in 72 hours   Complete by: As directed    Make sure all dressings are  removed by the third day after surgery.  Leave incisions open to air.  OK to cover incisions with gauze or bandages as desired   Sexual Activity Restrictions   Complete by: As directed    You may have sexual intercourse when it is comfortable. If it hurts to do something, stop.      Allergies as of 12/22/2018      Reactions   Sulfonamide Derivatives Hives, Other (See Comments)   Light sensitivity      Medication List    TAKE these medications   acetaminophen 325 MG tablet Commonly known as: TYLENOL Take 2 tablets (650 mg total) by mouth every 6 (six) hours as needed for mild pain (or Fever >/= 101).   amLODipine-valsartan 10-320 MG tablet Commonly known as: EXFORGE TAKE 1 TABLET BY MOUTH ONCE DAILY   ARIPiprazole 10 MG tablet Commonly known as: ABILIFY Take 10 mg by mouth daily after breakfast.   aspirin EC 81 MG tablet Take 81 mg by mouth daily.   atorvastatin 20 MG tablet Commonly known as: LIPITOR TAKE 1 TABLET BY MOUTH ONCE DAILY What changed: when to take this   carvedilol 6.25 MG tablet Commonly known as: COREG TAKE 1 TABLET BY MOUTH TWICE DAILY WITH A MEAL What changed: See the new instructions.   lamoTRIgine 100 MG tablet Commonly known as: LAMICTAL Take 100 mg by mouth 2 (two) times daily.   ondansetron 4 MG disintegrating tablet Commonly known as: Zofran ODT Take 1 tablet (4 mg total) by mouth every 8 (eight) hours as needed for nausea or vomiting.   oxyCODONE 5 MG immediate release tablet Commonly known as: Oxy IR/ROXICODONE Take 1-2 tablets (5-10 mg total) by mouth every 4 (four) hours as needed for moderate pain, severe pain or breakthrough pain.   pantoprazole 40 MG tablet Commonly known as: PROTONIX Take 1 tablet (40 mg total) by mouth daily.   saccharomyces boulardii 250 MG capsule Commonly known as: FLORASTOR Take 1 capsule (250 mg total) by mouth 2 (two) times daily. You can find a probiotic over the counter.   traZODone 50 MG  tablet Commonly known as: DESYREL Take 100-200 mg by mouth at bedtime.            Discharge Care Instructions  (From admission, onward)         Start     Ordered   12/22/18 0000  Discharge wound care:    Comments: It is  good for closed incisions and even open wounds to be washed every day.  Shower every day.  Short baths are fine.  Wash the incisions and wounds clean with soap & water.    You may leave closed incisions open to air if it is dry.   You may cover the incision with clean gauze & replace it after your daily shower for comfort.   12/22/18 0821          Significant Diagnostic Studies:   SURGICAL PATHOLOGY  CASE: [id number]  PATIENT: Kaitlyn Harper  Surgical Pathology Report      Clinical History: diverticulitis      FINAL MICROSCOPIC DIAGNOSIS:   A. COLON, RECTOSIGMOID, RESECTION:  - Benign colorectal type mucosa with diverticular disease.  - 3 benign lymph nodes (0/3).  - The surgical resection margins are histologically viable.  - There is no evidence of malignancy.   B. ILEUM, RESECTION:  - Benign small bowel type mucosa with serosal adhesions.   C. COLON, DISTAL RING, EXCISION:  - Benign colorectal type mucosa.     Serene Kopf DESCRIPTION:   A. Specimen: Rectosigmoid colon  Length: Sigmoid colon is 11 cm in length, proximal third of rectum is  4.5 cm in length  Serosa: Tan-pink, focally roughened, with prominent, indurated fat  wrapping to the serosal side at the distal sigmoid colon.  Contents: Partially filled with green-yellow material  Mucosa/Wall: Wall is thickened to 0.9 cm at the indurated soft tissue.  The mucosa is tan-pink with prominent redundant folding. Several  diverticula are present, including a 1 cm diverticula at the thickened  wall. Perforation is not grossly seen.  Lymph nodes: 3 tan-pink lymph nodes ranging from 0.4 to 0.7 cm are  sampled  Block Summary:  1 = proximal margin en face  2 = distal margin f face   3 = distal sigmoid colon with large diverticulum  4 = additional diverticula  5 = 3 lymph nodes   B. Received fresh and placed in formalin is a 15 cm in length by 2.4 cm  diameter segment of small bowel, received with both ends partially  stapled to 1 another. Serosa is diffusely roughened and hyperemic.  Upon opening, the wall averages 0.2 cm, mucosa is tan-pink with normal  mucosal folding, there are green-yellow contents, and discrete lesions  are not grossly seen. Representative sections are submitted as follows:  Block summary:  1-2 = representative margins, perpendicular sections  3 = midsegment   C. Received in formalin is an unoriented 1 cm in length by 2.2 cm  diameter donut shaped portion of tan-pink to hyperemic mucosa. 1 end is  received partially stapled, the other end is received opened. Discrete  lesions not grossly seen. Representative sections are submitted in 1  cassette. (AK 12/18/2018)     Final Diagnosis performed by Enid Cutter, MD.  Electronically signed  12/19/2018  Technical and / or Professional components performed at [facility], [place] [address], [place], [place] [address].  Immunohistochemistry Technical component (if applicable) was performed  at [facility]. [address] [place],  [place], [place] [address].  IMMUNOHISTOCHEMISTRY DISCLAIMER (if applicable):  Some of these immunohistochemical stains may have been developed and the  performance characteristics determine by [facility]. Some  may not have been cleared or approved by the U.S. Food and Drug  Administration. The FDA has determined that such clearance or approval  is not necessary. This test is used for clinical purposes.  It should not  be regarded as investigational or for research. This laboratory is  certified under the [place]  (CLIA-88) as qualified to perform high complexity  clinical laboratory  testing. The controls stained appropriately.   Results for orders placed or performed during the hospital encounter of 12/17/18 (from the past 72 hour(s))  Basic metabolic panel     Status: Abnormal   Collection Time: 12/21/18 10:27 AM  Result Value Ref Range   Sodium 133 (L) 135 - 145 mmol/L   Potassium 3.9 3.5 - 5.1 mmol/L   Chloride 99 98 - 111 mmol/L   CO2 27 22 - 32 mmol/L   Glucose, Bld 107 (H) 70 - 99 mg/dL   BUN 20 6 - 20 mg/dL   Creatinine, Ser 1.01 (H) 0.44 - 1.00 mg/dL   Calcium 7.9 (L) 8.9 - 10.3 mg/dL   GFR calc non Af Amer >60 >60 mL/min   GFR calc Af Amer >60 >60 mL/min   Anion gap 7 5 - 15    Comment: Performed at [facility], [place] [address] [place], [place] [address]  Magnesium     Status: None   Collection Time: 12/21/18 10:27 AM  Result Value Ref Range   Magnesium 2.2 1.7 - 2.4 mg/dL    Comment: Performed at [facility], [place] [address], [place], [place] [address]  CBC     Status: Abnormal   Collection Time: 12/21/18 10:27 AM  Result Value Ref Range   WBC 11.5 (H) 4.0 - 10.5 K/uL   RBC 3.40 (L) 3.87 - 5.11 MIL/uL   Hemoglobin 10.4 (L) 12.0 - 15.0 g/dL   HCT 32.7 (L) 36.0 - 46.0 %   MCV 96.2 80.0 - 100.0 fL   MCH 30.6 26.0 - 34.0 pg   MCHC 31.8 30.0 - 36.0 g/dL   RDW 13.6 11.5 - 15.5 %   Platelets 300 150 - 400 K/uL   nRBC 0.0 0.0 - 0.2 %    Comment: Performed at [facility], [place] [address], [place], [place] [address]    No results found.  Past Medical History:  Diagnosis Date   Anemia    resolved    Anxiety    Bipolar disorder ([place])    Depression    Diabetes mellitus without complication ([place])    type 2-diet controlled   Diverticulitis    Hyperglycemia    Hyperlipidemia    Hypertension    NSTEMI (non-ST elevated myocardial infarction) ([place]) 2018   reports she fainted at his sons house and they took her to ED  , troponin was elevated and cardiac cath  was completed,  no stent placed nor hx of stent intervention , she reports no casue was ever determined for the sycope episode    Syncope 03/18/2016   reports no recurrence     Past Surgical History:  Procedure Laterality Date   [place] W/ POLYPECTOMY  03/01/2015   Dr Oretha Caprice, [facility].  +adenomatous polyps   COLONOSCOPY W/ POLYPECTOMY  02/28/2011   4 polyps - 3 TA   DILATION AND CURETTAGE OF UTERUS  2005   HEMORRHOID SURGERY  2002   KNEE SURGERY  1983   arthroscopy?   LEFT HEART CATH AND CORONARY ANGIOGRAPHY N/A 03/19/2016   Procedure: Left Heart Cath and Coronary Angiography;  Surgeon: Peter M Martinique, MD;  Location: [place] [facility];  Service: Cardiovascular;  Laterality: N/A;   MOUTH SURGERY  03/08/2016   POLYPECTOMY  2006   ?anal polyp?   TEAR DUCT PROBING  2004   TUBAL LIGATION  1985    Social History   Socioeconomic History   Marital status: Single    Spouse name: Not on file   Number of children: 3   Years of education: Not on file   Highest education level: Not on file  Occupational History   Occupation: Teacher, music    Employer: [employer]  Social Needs   Emergency planning/management officer strain: Not on file   Food insecurity    Worry: Not on file    Inability: Not on file   Transportation needs    Medical: Not on file    Non-medical: Not on file  Tobacco Use   Smoking status: Current Some Day Smoker    Packs/day: 0.25    Types: Cigarettes    Last attempt to quit: 07/02/2015    Years since quitting: 3.4   Smokeless tobacco: Never Used   Tobacco comment: 10 cigarettes daily; 10-30 reports " one to two cigarettes every now and again"   Substance and Sexual Activity   Alcohol use: Yes    Alcohol/week: 0.0 standard drinks    Comment: socially   Drug use: No   Sexual activity: Yes    Birth control/protection: Post-menopausal  Lifestyle   Physical activity    Days per week: Not on  file    Minutes per session: Not on file   Stress: Not on file  Relationships   Social connections    Talks on phone: Not on file    Gets together: Not on file    Attends religious service: Not on file    Active member of club or organization: Not on file    Attends meetings of clubs or organizations: Not on file    Relationship status: Not on file   Intimate partner violence    Fear of current or ex partner: Not on file    Emotionally abused: Not on file    Physically abused: Not on file    Forced sexual activity: Not on file  Other Topics Concern   Not on file  Social History Narrative   Caffeine use:  2 drinks   Regular exercise: no   Lives with her daughter   3 children    2 grandchildren   1 great grandson    Family History  Problem Relation Age of Onset   Hypertension Father    Sarcoidosis Brother    Alcohol abuse Brother    Cirrhosis Brother    Stroke Maternal Grandmother    Colon cancer Neg Hx    Colon polyps Neg Hx    Esophageal cancer Neg Hx    Rectal cancer Neg Hx    Stomach cancer Neg Hx    Pulmonary embolism Neg Hx     Current Facility-Administered Medications  Medication Dose Route Frequency Provider Last Rate Last Dose   0.9 %  sodium chloride infusion  250 mL Intravenous PRN Michael Boston, MD       acetaminophen (TYLENOL) tablet 1,000 mg  1,000 mg Oral Q6H Michael Boston, MD   1,000 mg at 12/22/18 0528   alum & mag hydroxide-simeth (MAALOX/MYLANTA) 200-200-20 MG/5ML suspension 30 mL  30 mL Oral Q6H PRN Michael Boston, MD   30 mL at 12/21/18 2123   amLODipine (NORVASC) tablet 10 mg  10 mg Oral Daily Maudry Zeidan,  Remo Lipps, MD   10 mg at 12/21/18 1255   ARIPiprazole (ABILIFY) tablet 10 mg  10 mg Oral QPC breakfast Michael Boston, MD   10 mg at 12/22/18 [id number]   aspirin EC tablet 81 mg  81 mg Oral Daily Michael Boston, MD   81 mg at 12/21/18 1256   atorvastatin (LIPITOR) tablet 20 mg  20 mg Oral QPC breakfast Michael Boston, MD   20 mg at 12/22/18  [id number]   bismuth subsalicylate (PEPTO BISMOL) 262 MG/15ML suspension 30 mL  30 mL Oral Q8H PRN Michael Boston, MD   30 mL at 12/21/18 0850   carvedilol (COREG) tablet 6.25 mg  6.25 mg Oral BID WC Michael Boston, MD   6.25 mg at 12/22/18 0819   diphenhydrAMINE (BENADRYL) 12.5 MG/5ML elixir 12.5 mg  12.5 mg Oral Q6H PRN Michael Boston, MD       Or   diphenhydrAMINE (BENADRYL) injection 12.5 mg  12.5 mg Intravenous Q6H PRN Michael Boston, MD       enoxaparin (LOVENOX) injection 40 mg  40 mg Subcutaneous Q24H Michael Boston, MD   40 mg at 12/22/18 0819   feeding supplement (ENSURE SURGERY) liquid 237 mL  237 mL Oral BID BM Michael Boston, MD   237 mL at 12/21/18 1252   gabapentin (NEURONTIN) capsule 300 mg  300 mg Oral TID Michael Boston, MD   300 mg at 12/21/18 2116   HYDROmorphone (DILAUDID) injection 0.5-2 mg  0.5-2 mg Intravenous Q4H PRN Michael Boston, MD   1 mg at 12/19/18 [id number]   irbesartan (AVAPRO) tablet 150-300 mg  150-300 mg Oral Daily Lenis Noon, RPH   150 mg at 12/21/18 1254   lactated ringers bolus 1,000 mL  1,000 mL Intravenous Q8H PRN Michael Boston, MD       lamoTRIgine (LAMICTAL) tablet 100 mg  100 mg Oral BID Michael Boston, MD   100 mg at 12/21/18 2116   lip balm (CARMEX) ointment 1 application  1 application Topical BID Michael Boston, MD   1 application at [id number]   magic mouthwash  15 mL Oral QID PRN Michael Boston, MD       methocarbamol (ROBAXIN) 1,000 mg in dextrose 5 % 100 mL IVPB  1,000 mg Intravenous Q6H PRN Michael Boston, MD       metoCLOPramide (REGLAN) injection 5-10 mg  5-10 mg Intravenous Q8H PRN Michael Boston, MD       metoprolol tartrate (LOPRESSOR) injection 5 mg  5 mg Intravenous Q6H PRN Michael Boston, MD       ondansetron [facility]) tablet 4 mg  4 mg Oral Q6H PRN Michael Boston, MD       Or   ondansetron [facility]) injection 4 mg  4 mg Intravenous Q6H PRN Michael Boston, MD   4 mg at 12/20/18 2202   ondansetron (ZOFRAN-ODT) disintegrating tablet 4 mg  4  mg Oral Q8H PRN Michael Boston, MD       oxyCODONE (Oxy IR/ROXICODONE) immediate release tablet 5-10 mg  5-10 mg Oral Q4H PRN Michael Boston, MD   5 mg at 12/21/18 1727   pantoprazole (PROTONIX) EC tablet 40 mg  40 mg Oral BID Rogelia Mire, MD   40 mg at 12/22/18 0819   prochlorperazine (COMPAZINE) tablet 10 mg  10 mg Oral Q6H PRN Michael Boston, MD   10 mg at 12/21/18 0401   Or   prochlorperazine (COMPAZINE) injection 5-10 mg  5-10 mg Intravenous Q6H PRN Michael Boston, MD  psyllium (HYDROCIL/METAMUCIL) packet 1 packet  1 packet Oral BID Michael Boston, MD   1 packet at 12/21/18 2115   saccharomyces boulardii (FLORASTOR) capsule 250 mg  250 mg Oral BID Michael Boston, MD   250 mg at 12/21/18 2116   sodium chloride flush (NS) 0.9 % injection 3 mL  3 mL Intravenous Gorden Harms, MD   3 mL at 12/21/18 2117   sodium chloride flush (NS) 0.9 % injection 3 mL  3 mL Intravenous PRN Michael Boston, MD       sucralfate (CARAFATE) 1 GM/10ML suspension 1 g  1 g Oral Q6H PRN Michael Boston, MD       traZODone (DESYREL) tablet 200 mg  200 mg Oral Ardeen Fillers, MD   200 mg at 12/21/18 2116   witch hazel-glycerin (TUCKS) pad 1 application  1 application Topical PRN Michael Boston, MD   1 application at [id number]     Allergies  Allergen Reactions   Sulfonamide Derivatives Hives and Other (See Comments)    Light sensitivity    Signed: Morton Peters, MD, FACS, MASCRS Gastrointestinal and Minimally Invasive Surgery  [facility] [address], [place], [place] [address] [phone] Main / Paging [phone] Fax     12/22/2018, 8:22 AM

## 2018-12-23 NOTE — Progress Notes (Signed)
  Path benign. I told the pt the good news

## 2018-12-24 ENCOUNTER — Other Ambulatory Visit: Payer: Self-pay | Admitting: *Deleted

## 2018-12-24 ENCOUNTER — Encounter: Payer: Self-pay | Admitting: *Deleted

## 2018-12-24 NOTE — Patient Outreach (Signed)
[facility]) Care Management  12/24/2018  Kaitlyn Harper 28-Oct-1960 [id number]   Transition of care call/case closure   Referral received:12/15/18 Initial outreach:12/15/18 Insurance: [employer]    Subjective: Initial successful telephone call to patient's preferred number in order to complete transition of care assessment; 2 HIPAA identifiers verified. Explained purpose of call and completed transition of care assessment.  She states she is a little sore but doing good , denies post-operative problems, says surgical incisions are unremarkable she has one dressing in place at previous drain site, that site along with other surgical sites without redness. She states  surgical pain well managed with prescribed medications. Patient discussed taking diet slow eating 5 small meals of soft foods for now, she denies nausea. She report passing gas and having a bowel movement. Patient daughter is assisting her in recovery.  She discussed being relieved that she did not have to have colostomy and that  pathology results where benign . Patient continues to monitor her blood pressure at home and recent reading of 124/80.  She feels that her chronic blood pressure condition is controlled and she does not  need a referral to one of the [place] chronic disease management programs.  She has that she does  have the hospital indemnity and has made contact with [facility]. She discussed cost concern with paying hospital bill, verified that she has [facility] Patient Financial service contact number.  She does [facility]. Marlana Salvage denies educational needs related to staying safe during the [place] 19 pandemic.    Objective:  Kaitlyn Harper  was hospitalized at [facility]  From 11/4-11/9/20 for Robotic assisted Resection of Rectosigmoid Colon  Comorbidities include: Hypertension, Myocardial Infarction, diverticulitis, IBS, Type 2 DM,( A1c on  12/02/18 was 5.7), hyperlipidemia, insomnia. She was  She was discharged to home on 12/22/2018 without the need for home health services or DME.   Assessment:  Patient voices good understanding of all discharge instructions.  See transition of care flowsheet for assessment details.   Plan:  Reviewed hospital discharge diagnosis of Robotic assisted resection of rectosigmoid colon   and discharge treatment plan using hospital discharge instructions, assessing medication adherence, reviewing problems requiring provider notification, and discussing the importance of follow up with surgeon, primary care provider and/or specialists as directed. Reviewed [place]'s announcements that all [place] members will receive the Healthy Lifestyle Premium rate in 2021.   No ongoing care management needs identified so will close case to [place] Management services and route successful outreach letter with [place] Management pamphlet and 24 Hour Nurse Line Magnet to [place] Management clinical pool to be mailed to patient's home address. Thanked patient for their services to [facility].   Joylene Draft, RN, [place] Management Coordinator  [phone]- Mobile [phone]- Toll Free Main Office

## 2019-01-06 MED FILL — CARVEDILOL 6.25 MG TABLET: 6.25 | 90 days supply | Qty: 180 | Fill #1

## 2019-01-06 MED FILL — ARIPIPRAZOLE 10 MG TABS: 10 | 90 days supply | Qty: 90 | Fill #1

## 2019-01-06 MED FILL — AMLODIPINE-VALSARTAN 10-320: 10-320 | 90 days supply | Qty: 90 | Fill #1

## 2019-01-16 DIAGNOSIS — Z760 Encounter for issue of repeat prescription: Secondary | ICD-10-CM | POA: Diagnosis not present

## 2019-01-21 MED FILL — LAMOTRIGINE 100 MG TABS: 100 | 90 days supply | Qty: 180 | Fill #1

## 2019-01-22 DIAGNOSIS — F3181 Bipolar II disorder: Secondary | ICD-10-CM | POA: Diagnosis not present

## 2019-01-27 MED FILL — traZODone HCL 50 MG TABS: 50 | 90 days supply | Qty: 180 | Fill #1

## 2019-02-03 ENCOUNTER — Ambulatory Visit (INDEPENDENT_AMBULATORY_CARE_PROVIDER_SITE_OTHER): Payer: 59 | Admitting: Family

## 2019-02-03 ENCOUNTER — Encounter: Payer: Self-pay | Admitting: Family

## 2019-02-03 VITALS — BP 155/89 | HR 99

## 2019-02-03 DIAGNOSIS — E119 Type 2 diabetes mellitus without complications: Secondary | ICD-10-CM | POA: Diagnosis not present

## 2019-02-03 DIAGNOSIS — F319 Bipolar disorder, unspecified: Secondary | ICD-10-CM | POA: Diagnosis not present

## 2019-02-03 DIAGNOSIS — E785 Hyperlipidemia, unspecified: Secondary | ICD-10-CM | POA: Diagnosis not present

## 2019-02-03 DIAGNOSIS — I10 Essential (primary) hypertension: Secondary | ICD-10-CM | POA: Diagnosis not present

## 2019-02-03 DIAGNOSIS — K5909 Other constipation: Secondary | ICD-10-CM | POA: Diagnosis not present

## 2019-02-03 NOTE — Progress Notes (Deleted)
   Subjective:    Patient ID: Kaitlyn Harper, female    DOB: [birth year], 58 y.o.   MRN: [id number]  HPI  Patient is a 58 yr old female who presents today for follow up.  HTN- maintained on exforge BP Readings from Last 3 Encounters:  02/03/19 (!) 155/89  12/22/18 101/65  12/12/18 (!) 147/78   DM2-  Lab Results  Component Value Date   HGBA1C 5.7 (H) 12/12/2018   HGBA1C 5.9 08/05/2018   HGBA1C 5.9 02/24/2018   Lab Results  Component Value Date   MICROALBUR 1.8 08/02/2015   LDLCALC 55 08/05/2018   CREATININE 1.01 (H) 12/21/2018   Depression- managed by psychiatry.   She had an admission.   Review of Systems     Objective:   Physical Exam        Assessment & Plan:

## 2019-02-03 NOTE — Progress Notes (Signed)
Virtual Visit via Video Note  I connected with Kaitlyn Harper on 02/03/19 at  3:20 PM EST by a video enabled telemedicine application and verified that I am speaking with the correct person using two identifiers.  Location: Patient: home Provider: work   I discussed the limitations of evaluation and management by telemedicine and the availability of in person appointments. The patient expressed understanding and agreed to proceed.  History of Present Illness:   Patient is a 58 yr old female who presents today for follow up.  HTN- maintained on exforge. Reports good compliance BP Readings from Last 3 Encounters:  02/03/19 (!) 155/89  12/22/18 101/65  12/12/18 (!) 147/78   DM2- diet controlled.  Lab Results  Component Value Date   HGBA1C 5.7 (H) 12/12/2018   HGBA1C 5.9 08/05/2018   HGBA1C 5.9 02/24/2018   Lab Results  Component Value Date   MICROALBUR 1.8 08/02/2015   LDLCALC 55 08/05/2018   CREATININE 1.01 (H) 12/21/2018   Bipolar disorder- managed by psychiatry. She sees Zebedee Iba for  psychiatry. Reports mood has been stable.   She had an admission back October for diverticulitis with chronic pelvic abscess. She reports doing well since returning home except for some constipation. She and underwent the following procedure:  Procedure(s): XI ROBOT ASSISTED LOW ANTERIOR RESECTION, RECTOSIGMOID RESECTION, LYSIS OF ADHESIONS, DRAINING OF PELVIC ABSCESS, RIGID PROCTOSCOPY,COLON RESECTION    Review of Systems See HPI  Past Medical History:  Diagnosis Date  . Anemia    resolved   . Anxiety   . Bipolar disorder ([place])   . Depression   . Diabetes mellitus without complication ([place])    type 2-diet controlled  . Diverticulitis   . Hyperglycemia   . Hyperlipidemia   . Hypertension   . NSTEMI (non-ST elevated myocardial infarction) ([place]) 2018   reports she fainted at his sons house and they took her to ED  , troponin was elevated and cardiac cath was completed,  no  stent placed nor hx of stent intervention , she reports no casue was ever determined for the sycope episode   . Syncope 03/18/2016   reports no recurrence      Social History   Socioeconomic History  . Marital status: Single    Spouse name: Not on file  . Number of children: 3  . Years of education: Not on file  . Highest education level: Not on file  Occupational History  . Occupation: Water quality scientist: [employer]  Tobacco Use  . Smoking status: Current Some Day Smoker    Packs/day: 0.25    Types: Cigarettes    Last attempt to quit: 07/02/2015    Years since quitting: 3.5  . Smokeless tobacco: Never Used  . Tobacco comment: 10 cigarettes daily; 10-30 reports " one to two cigarettes every now and again"   Substance and Sexual Activity  . Alcohol use: Yes    Alcohol/week: 0.0 standard drinks    Comment: socially  . Drug use: No  . Sexual activity: Yes    Birth control/protection: Post-menopausal  Other Topics Concern  . Not on file  Social History Narrative   Caffeine use:  2 drinks   Regular exercise: no   Lives with her daughter   3 children    2 grandchildren   1 great grandson   Social Determinants of Radio broadcast assistant Strain:   . Difficulty of Paying Living Expenses: Not on file  Food  Insecurity:   . Worried About Charity fundraiser in the Last Year: Not on file  . Ran Out of Food in the Last Year: Not on file  Transportation Needs:   . Lack of Transportation (Medical): Not on file  . Lack of Transportation (Non-Medical): Not on file  Physical Activity:   . Days of Exercise per Week: Not on file  . Minutes of Exercise per Session: Not on file  Stress:   . Feeling of Stress : Not on file  Social Connections:   . Frequency of Communication with Friends and Family: Not on file  . Frequency of Social Gatherings with Friends and Family: Not on file  . Attends Religious Services: Not on file  . Active Member of Clubs or Organizations:  Not on file  . Attends Archivist Meetings: Not on file  . Marital Status: Not on file  Intimate Partner Violence:   . Fear of Current or Ex-Partner: Not on file  . Emotionally Abused: Not on file  . Physically Abused: Not on file  . Sexually Abused: Not on file    Past Surgical History:  Procedure Laterality Date  . APPENDECTOMY  1985  . CHOLECYSTECTOMY  1985  . COLONOSCOPY W/ POLYPECTOMY  03/01/2015   Dr Oretha Caprice, [facility].  +adenomatous polyps  . COLONOSCOPY W/ POLYPECTOMY  02/28/2011   4 polyps - 3 TA  . DILATION AND CURETTAGE OF UTERUS  2005  . [place]  2002  . KNEE SURGERY  1983   arthroscopy?  [name] LEFT HEART CATH AND CORONARY ANGIOGRAPHY N/A 03/19/2016   Procedure: Left Heart Cath and Coronary Angiography;  Surgeon: Peter M Martinique, MD;  Location: [facility];  Service: Cardiovascular;  Laterality: N/A;  . MOUTH SURGERY  03/08/2016  . POLYPECTOMY  2006   ?anal polyp?  Kathee Polite DUCT PROBING  2004  . TUBAL LIGATION  1985    Family History  Problem Relation Age of Onset  . Hypertension Father   . Sarcoidosis Brother   . Alcohol abuse Brother   . Cirrhosis Brother   . Stroke Maternal Grandmother   . Colon cancer Neg Hx   . Colon polyps Neg Hx   . Esophageal cancer Neg Hx   . Rectal cancer Neg Hx   . Stomach cancer Neg Hx   . Pulmonary embolism Neg Hx     Allergies  Allergen Reactions  . Sulfonamide Derivatives Hives and Other (See Comments)    Light sensitivity    Current Outpatient Medications on File Prior to Visit  Medication Sig Dispense Refill  . acetaminophen (TYLENOL) 325 MG tablet Take 2 tablets (650 mg total) by mouth every 6 (six) hours as needed for mild pain (or Fever >/= 101).    [name] amLODipine-valsartan (EXFORGE) 10-320 MG tablet TAKE 1 TABLET BY MOUTH ONCE DAILY (Patient taking differently: Take 1 tablet by mouth daily. ) 90 tablet 1  . ARIPiprazole (ABILIFY) 10 MG tablet Take 10 mg by mouth daily after breakfast.   2   . aspirin EC 81 MG tablet Take 81 mg by mouth daily.    [name] atorvastatin (LIPITOR) 20 MG tablet TAKE 1 TABLET BY MOUTH ONCE DAILY (Patient taking differently: Take 20 mg by mouth daily after breakfast. ) 90 tablet 1  . carvedilol (COREG) 6.25 MG tablet TAKE 1 TABLET BY MOUTH TWICE DAILY WITH A MEAL (Patient taking differently: Take 6.25 mg by mouth 2 (two) times daily with a meal. )  180 tablet 1  . lamoTRIgine (LAMICTAL) 100 MG tablet Take 100 mg by mouth 2 (two) times daily.   0  . psyllium (METAMUCIL) 58.6 % powder Take 1 packet by mouth 3 (three) times daily.    [name] saccharomyces boulardii (FLORASTOR) 250 MG capsule Take 1 capsule (250 mg total) by mouth 2 (two) times daily. You can find a probiotic over the counter.    . traZODone (DESYREL) 50 MG tablet Take 100-200 mg by mouth at bedtime.   2  . [DISCONTINUED] amLODipine-valsartan (EXFORGE) 10-320 MG tablet TAKE 1 TABLET DAILY BY MOUTH. 90 tablet 1  . [DISCONTINUED] carvedilol (COREG) 6.25 MG tablet TAKE 1 TABLET BY MOUTH TWICE DAILY WITH A MEAL 180 tablet 1   No current facility-administered medications on file prior to visit.    BP (!) 155/89 (BP Location: Left Arm, Patient Position: Sitting)   Pulse 99   LMP 12/25/2008       Observations/Objective:   Gen: Awake, alert, no acute distress Resp: Breathing is even and non-labored Psych: calm/pleasant demeanor Neuro: Alert and Oriented x 3, + facial symmetry, speech is clear.   Assessment and Plan:  HTN- bp mildly elevated today. I advised the pt to check her blood pressure once daily for a few days and then send me her readings via mychart for review. Continue exforge for now.  Bipolar disorder- clinically stable- management per psychiatry.  DM2- last A1C at goal. Continue diabetic diet.  Chronic constipation- we discussed importance of adequate water and fruit/vegetable intake.   Hyperlipidemia- LDL at goal on statin, continue same.   Follow Up Instructions:    I  discussed the assessment and treatment plan with the patient. The patient was provided an opportunity to ask questions and all were answered. The patient agreed with the plan and demonstrated an understanding of the instructions.   The patient was advised to call back or seek an in-person evaluation if the symptoms worsen or if the condition fails to improve as anticipated.  Nance Pear, NP

## 2019-02-11 IMAGING — MG DIGITAL SCREENING BILATERAL MAMMOGRAM WITH TOMO AND CAD
8 series · 8 of 24 positions shown · non-contrast
Comparison: Previous exam(s).

CLINICAL DATA: Screening.

EXAM:
DIGITAL SCREENING BILATERAL MAMMOGRAM WITH TOMO AND CAD

[R MLO synth-2D]
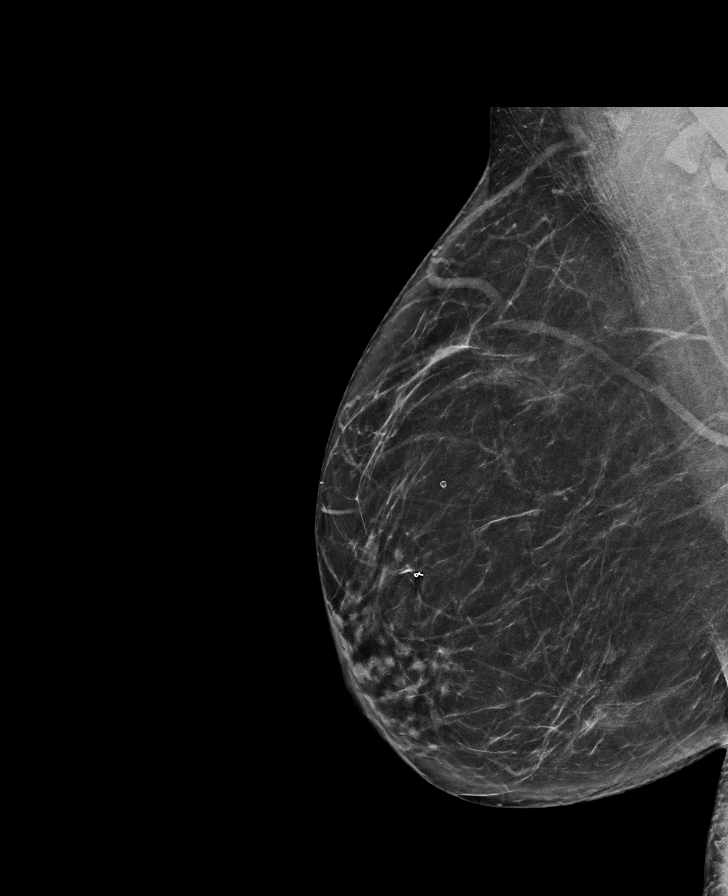

[L CC synth-2D]
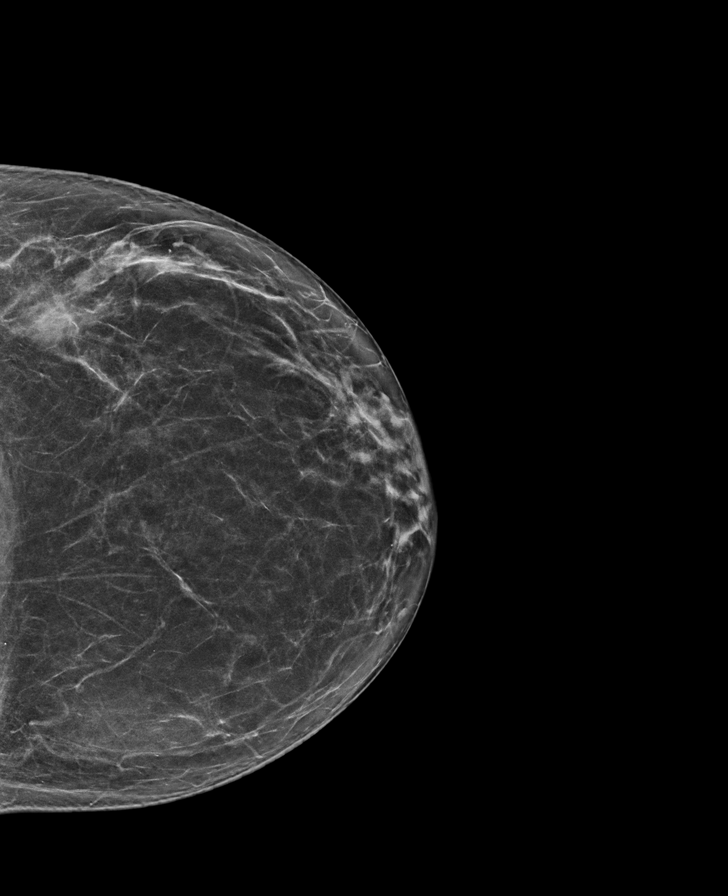

[R CC synth-2D]
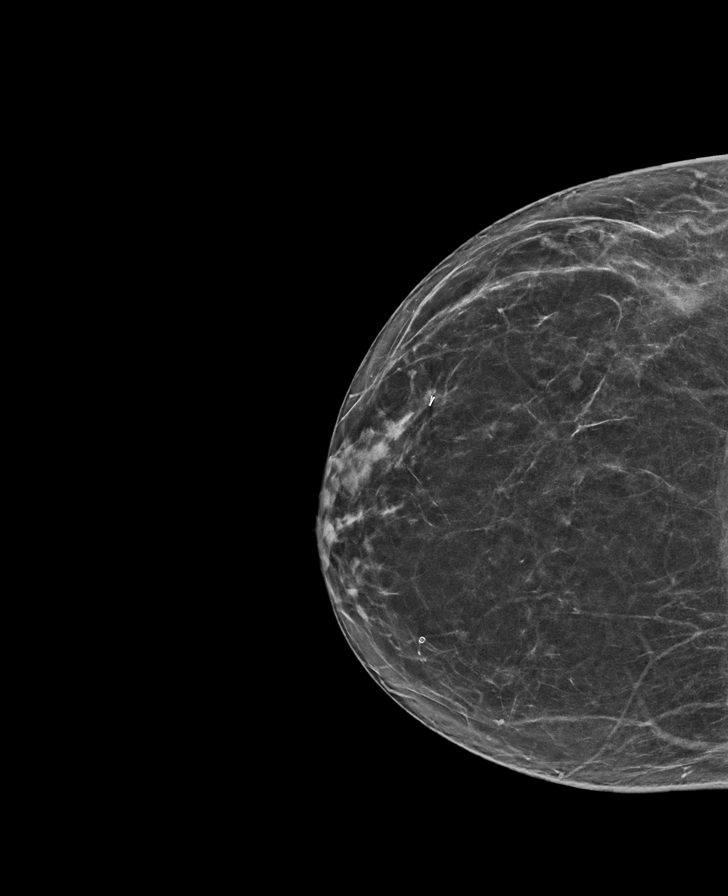

[L MLO synth-2D]
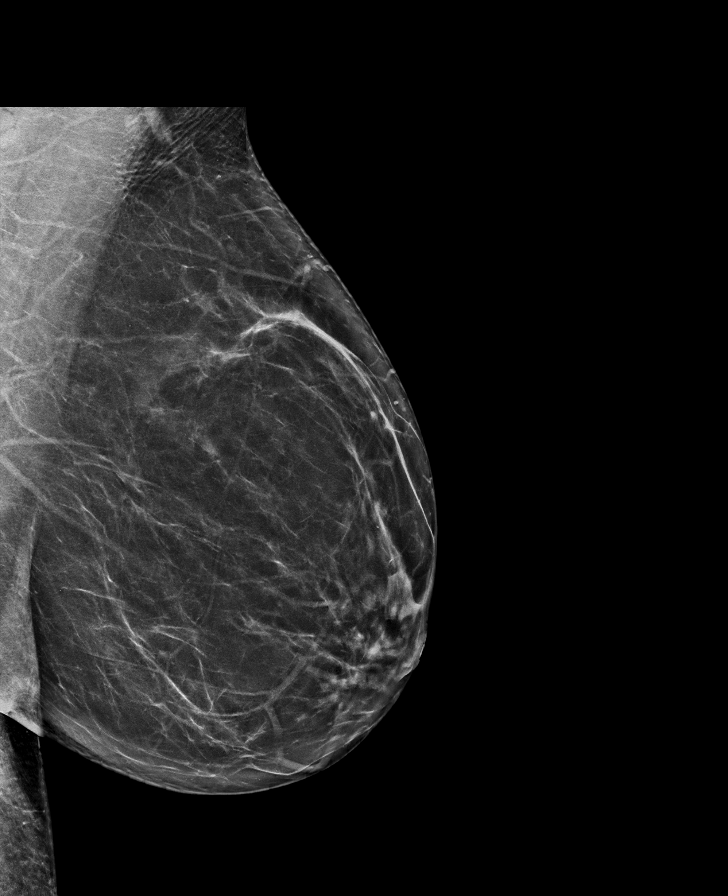

[R MLO tomo · tomo slice 39/77.0]
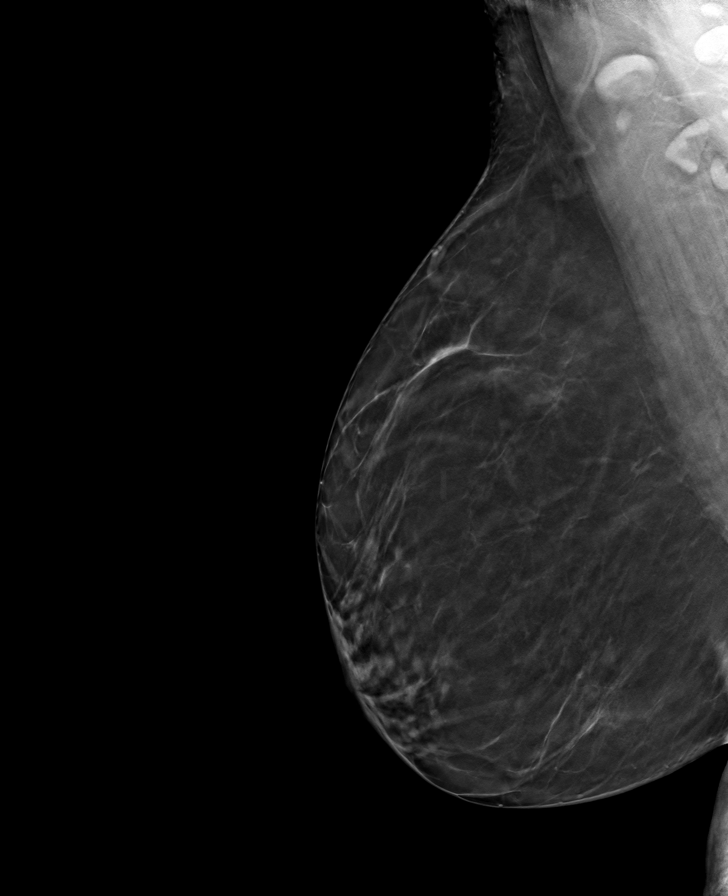

[L CC tomo · tomo slice 36/71.0]
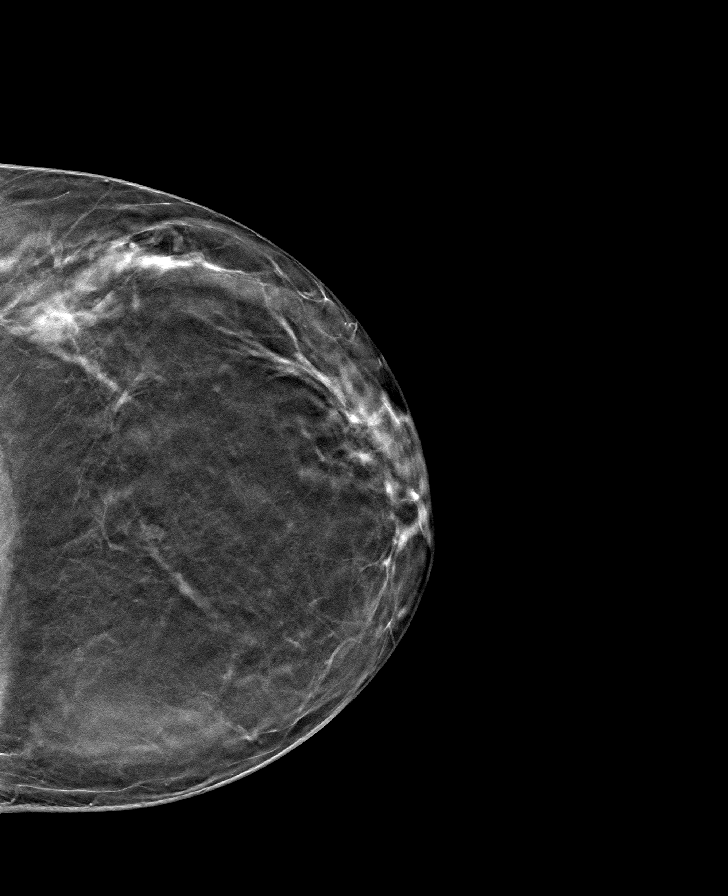

[R CC tomo · tomo slice 35/69.0]
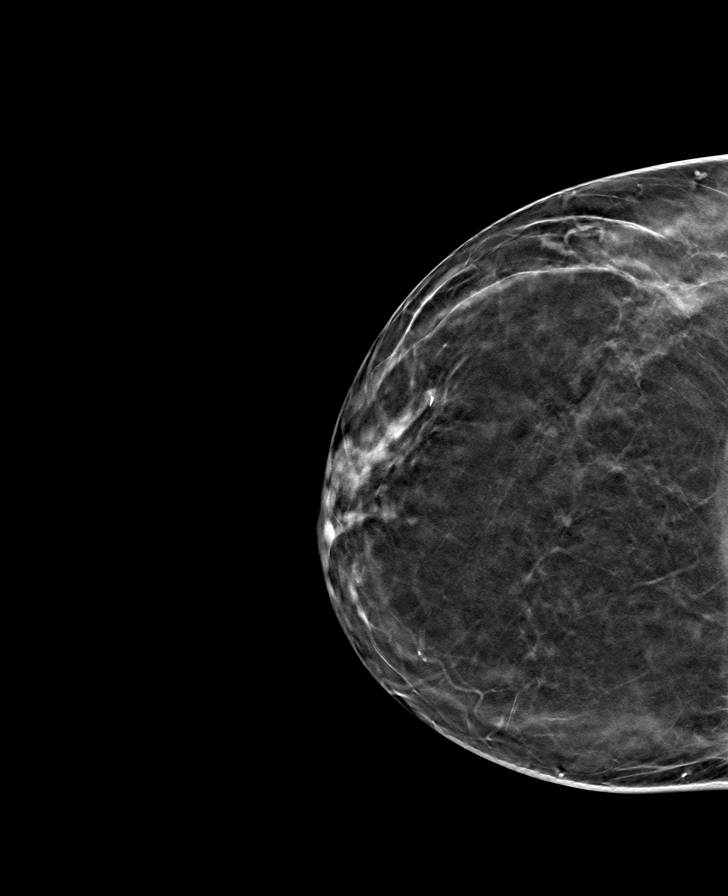

[L MLO tomo · tomo slice 41/80.0]
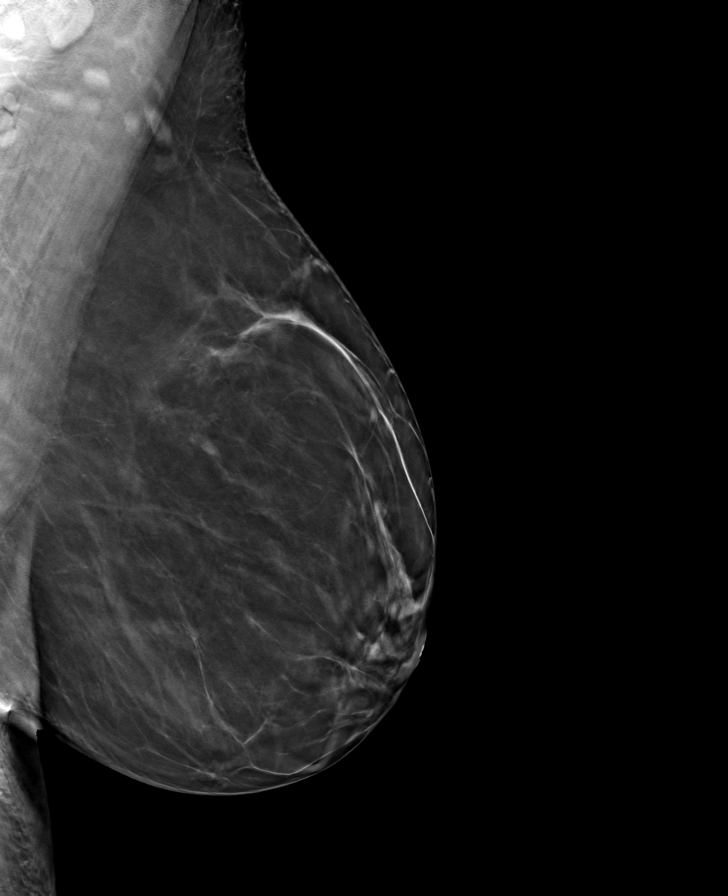

[8 of 24 positions shown; findings below may reference images not displayed]

ACR Breast Density Category b: There are scattered areas of
fibroglandular density.
FINDINGS: There are no findings suspicious for malignancy. Images were
processed with CAD.
IMPRESSION: No mammographic evidence of malignancy. A result letter of this
screening mammogram will be mailed directly to the patient.

RECOMMENDATION:
Screening mammogram in one year. (Code:CN-U-775)

BI-RADS CATEGORY  1: Negative.

## 2019-03-09 ENCOUNTER — Other Ambulatory Visit: Payer: Self-pay | Admitting: Family

## 2019-03-10 MED FILL — ATORVASTATIN 20 MG TABLET: 20 | 90 days supply | Qty: 90 | Fill #0

## 2019-04-13 ENCOUNTER — Other Ambulatory Visit: Payer: Self-pay | Admitting: Family

## 2019-04-13 MED FILL — ARIPIPRAZOLE 10 MG TABS: 10 | 90 days supply | Qty: 90 | Fill #0

## 2019-04-14 MED FILL — CARVEDILOL 6.25 MG TABLET: 6.25 | 90 days supply | Qty: 180 | Fill #0

## 2019-04-14 MED FILL — AMLODIPINE-VALSARTAN 10-320: 10-320 | 90 days supply | Qty: 90 | Fill #0

## 2019-04-27 MED FILL — LAMOTRIGINE 100 MG TABS: 100 | 90 days supply | Qty: 180 | Fill #0

## 2019-05-04 MED FILL — traZODone HCL 50 MG TABS: 50 | 90 days supply | Qty: 180 | Fill #0

## 2019-05-11 DIAGNOSIS — H25013 Cortical age-related cataract, bilateral: Secondary | ICD-10-CM | POA: Diagnosis not present

## 2019-05-11 DIAGNOSIS — H3509 Other intraretinal microvascular abnormalities: Secondary | ICD-10-CM | POA: Diagnosis not present

## 2019-05-11 DIAGNOSIS — E119 Type 2 diabetes mellitus without complications: Secondary | ICD-10-CM | POA: Diagnosis not present

## 2019-05-11 DIAGNOSIS — H2513 Age-related nuclear cataract, bilateral: Secondary | ICD-10-CM | POA: Diagnosis not present

## 2019-05-11 LAB — HM DIABETES EYE EXAM

## 2019-06-05 MED FILL — ATORVASTATIN 20 MG TABLET: 20 | 90 days supply | Qty: 90 | Fill #1

## 2019-06-16 ENCOUNTER — Other Ambulatory Visit (HOSPITAL_BASED_OUTPATIENT_CLINIC_OR_DEPARTMENT_OTHER): Payer: Self-pay | Admitting: Nurse Practitioner

## 2019-06-16 DIAGNOSIS — F3181 Bipolar II disorder: Secondary | ICD-10-CM | POA: Diagnosis not present

## 2019-07-14 MED FILL — ARIPIPRAZOLE 10 MG TABS: 10 | 90 days supply | Qty: 90 | Fill #1

## 2019-07-14 MED FILL — AMLODIPINE-VALSARTAN 10-320: 10-320 | 90 days supply | Qty: 90 | Fill #1

## 2019-07-14 MED FILL — CARVEDILOL 6.25 MG TABLET: 6.25 | 90 days supply | Qty: 180 | Fill #1

## 2019-07-17 ENCOUNTER — Other Ambulatory Visit: Payer: Self-pay

## 2019-07-17 ENCOUNTER — Encounter: Payer: Self-pay | Admitting: Family

## 2019-07-17 ENCOUNTER — Ambulatory Visit: Payer: 59 | Admitting: Family

## 2019-07-17 VITALS — BP 155/85 | HR 81 | Temp 97.7°F | Resp 16 | Ht 64.0 in | Wt 188.0 lb

## 2019-07-17 DIAGNOSIS — R635 Abnormal weight gain: Secondary | ICD-10-CM | POA: Diagnosis not present

## 2019-07-17 DIAGNOSIS — I10 Essential (primary) hypertension: Secondary | ICD-10-CM

## 2019-07-17 DIAGNOSIS — E119 Type 2 diabetes mellitus without complications: Secondary | ICD-10-CM

## 2019-07-17 DIAGNOSIS — L659 Nonscarring hair loss, unspecified: Secondary | ICD-10-CM | POA: Diagnosis not present

## 2019-07-17 DIAGNOSIS — E785 Hyperlipidemia, unspecified: Secondary | ICD-10-CM | POA: Diagnosis not present

## 2019-07-17 NOTE — Patient Instructions (Addendum)
Try downloading myfitness pal app to help you count calories and assist with weight loss. Try to get regular exercise such as walking. You should be contacted about scheduling your appointment with dermatology. Purchase knee high compression stockings to wear during the day to see if this helps with your swelling.

## 2019-07-17 NOTE — Progress Notes (Signed)
Subjective:    Patient ID: Kaitlyn Harper, female    DOB: 1960-06-11, 59 y.o.   MRN: [id number]  HPI  Patient is a 59 yr old female who presents today for follow up.  HTN- maintained on exforge. C/o swelling in her lower legs x 2 months. She has had significant weight gain in the past year.   Wt Readings from Last 3 Encounters:  07/17/19 188 lb (85.3 kg)  12/21/18 159 lb 6.3 oz (72.3 kg)  12/12/18 158 lb (71.7 kg)    BP Readings from Last 3 Encounters:  07/17/19 100/78  02/03/19 (!) 155/89  12/22/18 101/65   DM2- this has been diet controlled.  Lab Results  Component Value Date   HGBA1C 5.7 (H) 12/12/2018   HGBA1C 5.9 08/05/2018   HGBA1C 5.9 02/24/2018   Lab Results  Component Value Date   MICROALBUR 1.8 08/02/2015   LDLCALC 55 08/05/2018   CREATININE 1.01 (H) 12/21/2018   Hair loss- reports that her has been gradually getting thinner and thinner.  Reports that her symptoms started about 2 years ago.      Review of Systems See HPI  Past Medical History:  Diagnosis Date  . Anemia    resolved   . Anxiety   . Bipolar disorder ([place])   . Depression   . Diabetes mellitus without complication ([place])    type 2-diet controlled  . Diverticulitis   . Hyperglycemia   . Hyperlipidemia   . Hypertension   . NSTEMI (non-ST elevated myocardial infarction) ([place]) 2018   reports she fainted at his sons house and they took her to ED  , troponin was elevated and cardiac cath was completed,  no stent placed nor hx of stent intervention , she reports no casue was ever determined for the sycope episode   . Syncope 03/18/2016   reports no recurrence      Social History   Socioeconomic History  . Marital status: Single    Spouse name: Not on file  . Number of children: 3  . Years of education: Not on file  . Highest education level: Not on file  Occupational History  . Occupation: Water quality scientist: [employer]  Tobacco Use  . Smoking status: Current  Some Day Smoker    Packs/day: 0.25    Types: Cigarettes    Last attempt to quit: 07/02/2015    Years since quitting: 4.0  . Smokeless tobacco: Never Used  . Tobacco comment: 10 cigarettes daily; 10-30 reports " one to two cigarettes every now and again"   Substance and Sexual Activity  . Alcohol use: Yes    Alcohol/week: 0.0 standard drinks    Comment: socially  . Drug use: No  . Sexual activity: Yes    Birth control/protection: Post-menopausal  Other Topics Concern  . Not on file  Social History Narrative   Caffeine use:  2 drinks   Regular exercise: no   Lives with her daughter   3 children    2 grandchildren   1 great grandson   Social Determinants of Radio broadcast assistant Strain:   . Difficulty of Paying Living Expenses:   Food Insecurity:   . Worried About Charity fundraiser in the Last Year:   . Arboriculturist in the Last Year:   Transportation Needs:   . Film/video editor (Medical):   [name] Lack of Transportation (Non-Medical):   Physical Activity:   .  Days of Exercise per Week:   . Minutes of Exercise per Session:   Stress:   . Feeling of Stress :   Social Connections:   . Frequency of Communication with Friends and Family:   . Frequency of Social Gatherings with Friends and Family:   . Attends Religious Services:   . Active Member of Clubs or Organizations:   . Attends Archivist Meetings:   [name] Marital Status:   Intimate Partner Violence:   . Fear of Current or Ex-Partner:   . Emotionally Abused:   [name] Physically Abused:   . Sexually Abused:     Past Surgical History:  Procedure Laterality Date  . APPENDECTOMY  1985  . CHOLECYSTECTOMY  1985  . COLONOSCOPY W/ POLYPECTOMY  03/01/2015   Dr Oretha Caprice, [facility].  +adenomatous polyps  . COLONOSCOPY W/ POLYPECTOMY  02/28/2011   4 polyps - 3 TA  . DILATION AND CURETTAGE OF UTERUS  2005  . [place]  2002  . KNEE SURGERY  1983   arthroscopy?  [name] LEFT HEART CATH AND CORONARY  ANGIOGRAPHY N/A 03/19/2016   Procedure: Left Heart Cath and Coronary Angiography;  Surgeon: Peter M Martinique, MD;  Location: [facility];  Service: Cardiovascular;  Laterality: N/A;  . MOUTH SURGERY  03/08/2016  . POLYPECTOMY  2006   ?anal polyp?  Kathee Polite DUCT PROBING  2004  . TUBAL LIGATION  1985    Family History  Problem Relation Age of Onset  . Hypertension Father   . Sarcoidosis Brother   . Alcohol abuse Brother   . Cirrhosis Brother   . Stroke Maternal Grandmother   . Colon cancer Neg Hx   . Colon polyps Neg Hx   . Esophageal cancer Neg Hx   . Rectal cancer Neg Hx   . Stomach cancer Neg Hx   . Pulmonary embolism Neg Hx     Allergies  Allergen Reactions  . Sulfonamide Derivatives Hives and Other (See Comments)    Light sensitivity    Current Outpatient Medications on File Prior to Visit  Medication Sig Dispense Refill  . acetaminophen (TYLENOL) 325 MG tablet Take 2 tablets (650 mg total) by mouth every 6 (six) hours as needed for mild pain (or Fever >/= 101).    [name] amLODipine-valsartan (EXFORGE) 10-320 MG tablet TAKE 1 TABLET BY MOUTH ONCE DAILY 90 tablet 1  . ARIPiprazole (ABILIFY) 10 MG tablet Take 10 mg by mouth daily after breakfast.   2  . aspirin EC 81 MG tablet Take 81 mg by mouth daily.    [name] atorvastatin (LIPITOR) 20 MG tablet TAKE 1 TABLET BY MOUTH ONCE DAILY 90 tablet 1  . carvedilol (COREG) 6.25 MG tablet TAKE 1 TABLET BY MOUTH TWICE DAILY WITH A MEAL 180 tablet 1  . lamoTRIgine (LAMICTAL) 100 MG tablet Take 100 mg by mouth 2 (two) times daily.   0  . psyllium (METAMUCIL) 58.6 % powder Take 1 packet by mouth 3 (three) times daily.    [name] saccharomyces boulardii (FLORASTOR) 250 MG capsule Take 1 capsule (250 mg total) by mouth 2 (two) times daily. You can find a probiotic over the counter.    . traZODone (DESYREL) 50 MG tablet Take 100-200 mg by mouth at bedtime.   2  . [DISCONTINUED] amLODipine-valsartan (EXFORGE) 10-320 MG tablet TAKE 1 TABLET DAILY BY MOUTH.  90 tablet 1  . [DISCONTINUED] carvedilol (COREG) 6.25 MG tablet TAKE 1 TABLET BY MOUTH TWICE DAILY WITH A MEAL  180 tablet 1   No current facility-administered medications on file prior to visit.    BP (!) 155/85   Pulse 81   Temp 97.7 F (36.5 C) (Temporal)   Resp 16   Ht 5\' 4"  (1.626 m)   Wt 188 lb (85.3 kg)   LMP 12/25/2008   SpO2 100%   BMI 32.27 kg/m       Objective:   Physical Exam Constitutional:      Appearance: She is well-developed.  Cardiovascular:     Rate and Rhythm: Normal rate and regular rhythm.     Heart sounds: Normal heart sounds. No murmur.  Pulmonary:     Effort: Pulmonary effort is normal. No respiratory distress.     Breath sounds: Normal breath sounds. No wheezing.  Psychiatric:        Behavior: Behavior normal.        Thought Content: Thought content normal.        Judgment: Judgment normal.           Assessment & Plan:  HTN- bp a bit elevated today, but first reading was sbp 102? Discussed that amlodipine is most likely cause for her lower extremity edema.  I recommended trial of compression knee-high's during the day.  Diabetes type 2-clinically stable.  I suspect her A1c will be up with her weight gain.  We discussed importance of weight loss.  Obtain follow-up A1c.  Weight gain-discussed diet exercise and weight loss.  Obtain TSH.  Hair loss-she is wearing a wig today.  She declines to remove week for examination.  She would like referral to dermatology.  Referral placed.  This visit occurred during the SARS-CoV-2 public health emergency.  Safety protocols were in place, including screening questions prior to the visit, additional usage of staff PPE, and extensive cleaning of exam room while observing appropriate contact time as indicated for disinfecting solutions.

## 2019-07-18 LAB — COMPREHENSIVE METABOLIC PANEL
AG Ratio: 1.5 (calc) (ref 1.0–2.5)
ALT: 16 U/L (ref 6–29)
AST: 19 U/L (ref 10–35)
Albumin: 4.6 g/dL (ref 3.6–5.1)
Alkaline phosphatase (APISO): 103 U/L (ref 37–153)
BUN/Creatinine Ratio: 11 (calc) (ref 6–22)
BUN: 13 mg/dL (ref 7–25)
CO2: 26 mmol/L (ref 20–32)
Calcium: 9.9 mg/dL (ref 8.6–10.4)
Chloride: 106 mmol/L (ref 98–110)
Creat: 1.18 mg/dL — ABNORMAL HIGH (ref 0.50–0.99)
Globulin: 3.1 g/dL (calc) (ref 1.9–3.7)
Glucose, Bld: 106 mg/dL — ABNORMAL HIGH (ref 65–99)
Potassium: 3.7 mmol/L (ref 3.5–5.3)
Sodium: 141 mmol/L (ref 135–146)
Total Bilirubin: 0.4 mg/dL (ref 0.2–1.2)
Total Protein: 7.7 g/dL (ref 6.1–8.1)

## 2019-07-18 LAB — LIPID PANEL
Cholesterol: 130 mg/dL (ref <200)
HDL: 34 mg/dL — ABNORMAL LOW (ref >=50)
LDL Cholesterol (Calc): 76 mg/dL (calc)
Non-HDL Cholesterol (Calc): 96 mg/dL (calc) (ref <130)
Total CHOL/HDL Ratio: 3.8 (calc) (ref <5.0)
Triglycerides: 122 mg/dL (ref <150)

## 2019-07-18 LAB — HEMOGLOBIN A1C
Hgb A1c MFr Bld: 5.7 % of total Hgb — ABNORMAL HIGH (ref <5.7)
Mean Plasma Glucose: 117 (calc)
eAG (mmol/L): 6.5 (calc)

## 2019-07-18 LAB — TSH: TSH: 2.29 mIU/L (ref 0.40–4.50)

## 2019-07-23 ENCOUNTER — Telehealth: Payer: Self-pay | Admitting: Physician Assistant

## 2019-07-23 NOTE — Telephone Encounter (Signed)
NP, referred from [place]; Scheduled for 10/30/19 @ 1:45 Sydnee Levans

## 2019-07-29 MED FILL — LAMOTRIGINE 100 MG TABS: 100 | 90 days supply | Qty: 180 | Fill #0

## 2019-08-13 MED FILL — traZODone HCL 50 MG TABS: 50 | 90 days supply | Qty: 180 | Fill #0

## 2019-08-14 ENCOUNTER — Encounter: Payer: Self-pay | Admitting: Family

## 2019-08-14 ENCOUNTER — Telehealth: Payer: Self-pay | Admitting: Family

## 2019-08-14 ENCOUNTER — Other Ambulatory Visit: Payer: Self-pay

## 2019-08-14 ENCOUNTER — Ambulatory Visit: Payer: 59 | Admitting: Family

## 2019-08-14 ENCOUNTER — Telehealth (INDEPENDENT_AMBULATORY_CARE_PROVIDER_SITE_OTHER): Payer: 59 | Admitting: Family

## 2019-08-14 VITALS — BP 130/82 | HR 79 | Wt 183.0 lb

## 2019-08-14 DIAGNOSIS — R600 Localized edema: Secondary | ICD-10-CM

## 2019-08-14 DIAGNOSIS — R05 Cough: Secondary | ICD-10-CM

## 2019-08-14 DIAGNOSIS — R059 Cough, unspecified: Secondary | ICD-10-CM

## 2019-08-14 MED ORDER — BENZONATATE 100 MG PO CAPS: 100.0000 mg | ORAL_CAPSULE | Freq: Three times a day (TID) | ORAL | 0 refills | Status: DC | PRN

## 2019-08-14 MED ORDER — HYDROCHLOROTHIAZIDE 25 MG PO TABS: 25.0000 mg | ORAL_TABLET | Freq: Every day | ORAL | 3 refills | Status: DC

## 2019-08-14 MED ORDER — AMLODIPINE BESYLATE-VALSARTAN 5-320 MG PO TABS: 1.0000 | ORAL_TABLET | Freq: Every day | ORAL | 3 refills | Status: DC

## 2019-08-14 MED FILL — BENZONATATE 100 MG CAPS: 100 | 7 days supply | Qty: 20 | Fill #0

## 2019-08-14 MED FILL — HYDROCHLOROTHIAZIDE 25 MG T: 25 | 30 days supply | Qty: 30 | Fill #0

## 2019-08-14 MED FILL — AMLODIPINE-VALSARTAN 5-320: 5-320 | 30 days supply | Qty: 30 | Fill #0

## 2019-08-14 NOTE — Telephone Encounter (Signed)
Opened in error

## 2019-08-14 NOTE — Telephone Encounter (Signed)
Please call patient and schedule a 1 week follow up with me.

## 2019-08-14 NOTE — Progress Notes (Signed)
Virtual Visit via Video Note  I connected with Cordell A Lapier on 08/14/19 at  8:40 AM EDT by a video enabled telemedicine application and verified that I am speaking with the correct person using two identifiers.  Location: Patient: home Provider: office   I discussed the limitations of evaluation and management by telemedicine and the availability of in person appointments. The patient expressed understanding and agreed to proceed. Only the patient and myself were present for today's video call.   History of Present Illness:  Patient is a 59 year old female who presents today with 2 concerns:  1.  Lower extremity edema-last visit she complained of bilateral lower extremity edema.  We recommended a trial of compression hose.  She reports that she has been wearing compression hose on the days that she works and this does help with her lower extremity swelling.  She does report though that her left lower extremity is still slightly more swollen than the right.  She denies calf pain.  2.cough-she reports the cough began 2 days ago.  She denies associated fever, shortness of breath, or loss of taste or smell.  She reports that she has completed the COVID-19 vaccine series.  She denies known Covid exposure.  She does report voice hoarseness.    On carvedilol, exforge,  130/82 in right arm on recheck.  BP Readings from Last 3 Encounters:  08/14/19 (!) 146/89  07/17/19 (!) 155/85  02/03/19 (!) 155/89    Past Medical History:  Diagnosis Date  . Anemia    resolved   . Anxiety   . Bipolar disorder ([place])   . Depression   . Diabetes mellitus without complication ([place])    type 2-diet controlled  . Diverticulitis   . Hyperglycemia   . Hyperlipidemia   . Hypertension   . NSTEMI (non-ST elevated myocardial infarction) ([place]) 2018   reports she fainted at his sons house and they took her to ED  , troponin was elevated and cardiac cath was completed,  no stent placed nor hx of stent  intervention , she reports no casue was ever determined for the sycope episode   . Syncope 03/18/2016   reports no recurrence      Social History   Socioeconomic History  . Marital status: Single    Spouse name: Not on file  . Number of children: 3  . Years of education: Not on file  . Highest education level: Not on file  Occupational History  . Occupation: Water quality scientist: [employer]  Tobacco Use  . Smoking status: Current Some Day Smoker    Packs/day: 0.25    Types: Cigarettes    Last attempt to quit: 07/02/2015    Years since quitting: 4.1  . Smokeless tobacco: Never Used  . Tobacco comment: 10 cigarettes daily; 10-30 reports " one to two cigarettes every now and again"   Substance and Sexual Activity  . Alcohol use: Yes    Alcohol/week: 0.0 standard drinks    Comment: socially  . Drug use: No  . Sexual activity: Yes    Birth control/protection: Post-menopausal  Other Topics Concern  . Not on file  Social History Narrative   Caffeine use:  2 drinks   Regular exercise: no   Lives with her daughter   3 children    2 grandchildren   1 great grandson   Social Determinants of Radio broadcast assistant Strain:   . Difficulty of Paying Living Expenses:  Food Insecurity:   . Worried About Charity fundraiser in the Last Year:   . Arboriculturist in the Last Year:   Transportation Needs:   . Film/video editor (Medical):   [name] Lack of Transportation (Non-Medical):   Physical Activity:   . Days of Exercise per Week:   . Minutes of Exercise per Session:   Stress:   . Feeling of Stress :   Social Connections:   . Frequency of Communication with Friends and Family:   . Frequency of Social Gatherings with Friends and Family:   . Attends Religious Services:   . Active Member of Clubs or Organizations:   . Attends Archivist Meetings:   [name] Marital Status:   Intimate Partner Violence:   . Fear of Current or Ex-Partner:   . Emotionally  Abused:   [name] Physically Abused:   . Sexually Abused:     Past Surgical History:  Procedure Laterality Date  . APPENDECTOMY  1985  . CHOLECYSTECTOMY  1985  . COLONOSCOPY W/ POLYPECTOMY  03/01/2015   Dr Oretha Caprice, [facility].  +adenomatous polyps  . COLONOSCOPY W/ POLYPECTOMY  02/28/2011   4 polyps - 3 TA  . DILATION AND CURETTAGE OF UTERUS  2005  . [place]  2002  . KNEE SURGERY  1983   arthroscopy?  [name] LEFT HEART CATH AND CORONARY ANGIOGRAPHY N/A 03/19/2016   Procedure: Left Heart Cath and Coronary Angiography;  Surgeon: Peter M Martinique, MD;  Location: [facility];  Service: Cardiovascular;  Laterality: N/A;  . MOUTH SURGERY  03/08/2016  . POLYPECTOMY  2006   ?anal polyp?  Kathee Polite DUCT PROBING  2004  . TUBAL LIGATION  1985    Family History  Problem Relation Age of Onset  . Hypertension Father   . Sarcoidosis Brother   . Alcohol abuse Brother   . Cirrhosis Brother   . Stroke Maternal Grandmother   . Colon cancer Neg Hx   . Colon polyps Neg Hx   . Esophageal cancer Neg Hx   . Rectal cancer Neg Hx   . Stomach cancer Neg Hx   . Pulmonary embolism Neg Hx     Allergies  Allergen Reactions  . Sulfonamide Derivatives Hives and Other (See Comments)    Light sensitivity    Current Outpatient Medications on File Prior to Visit  Medication Sig Dispense Refill  . acetaminophen (TYLENOL) 325 MG tablet Take 2 tablets (650 mg total) by mouth every 6 (six) hours as needed for mild pain (or Fever >/= 101).    . ARIPiprazole (ABILIFY) 10 MG tablet Take 10 mg by mouth daily after breakfast.   2  . aspirin EC 81 MG tablet Take 81 mg by mouth daily.    [name] atorvastatin (LIPITOR) 20 MG tablet TAKE 1 TABLET BY MOUTH ONCE DAILY 90 tablet 1  . carvedilol (COREG) 6.25 MG tablet TAKE 1 TABLET BY MOUTH TWICE DAILY WITH A MEAL 180 tablet 1  . lamoTRIgine (LAMICTAL) 100 MG tablet Take 100 mg by mouth 2 (two) times daily.   0  . psyllium (METAMUCIL) 58.6 % powder Take 1 packet by  mouth 3 (three) times daily.    [name] saccharomyces boulardii (FLORASTOR) 250 MG capsule Take 1 capsule (250 mg total) by mouth 2 (two) times daily. You can find a probiotic over the counter.    . traZODone (DESYREL) 50 MG tablet Take 100-200 mg by mouth at bedtime.   2  . [  DISCONTINUED] amLODipine-valsartan (EXFORGE) 10-320 MG tablet TAKE 1 TABLET DAILY BY MOUTH. 90 tablet 1  . [DISCONTINUED] carvedilol (COREG) 6.25 MG tablet TAKE 1 TABLET BY MOUTH TWICE DAILY WITH A MEAL 180 tablet 1   No current facility-administered medications on file prior to visit.    BP (!) 146/89 (BP Location: Left Arm)   Pulse 79   Wt 183 lb (83 kg)   LMP 12/25/2008   BMI 31.41 kg/m      Observations/Objective:   Gen: Awake, alert, no acute distress Resp: Breathing is even and non-labored Psych: calm/pleasant demeanor Neuro: Alert and Oriented x 3, + facial symmetry, speech is clear.   Assessment and Plan:  Cough-likely viral etiology.  I would like to have her tested for COVID-19.  I have given her instructions on how to call to arrange this testing.  I have advised her to remain at home quarantined pending negative Covid results.  In the meantime I have given her prescription for Tessalon to be used as needed for cough.  She understands to call if symptoms worsen or if not improved in 1 week.  Lower extremity edema-I suspect that this is secondary to her amlodipine 10 mg.  We will make the following adjustments:  Please change Exforge from 10/320 mg to 5/320mg  once daily. Add HCTZ one tablet by mouth once daily.  She is to follow-up in 1 week.  Follow Up Instructions:    I discussed the assessment and treatment plan with the patient. The patient was provided an opportunity to ask questions and all were answered. The patient agreed with the plan and demonstrated an understanding of the instructions.   The patient was advised to call back or seek an in-person evaluation if the symptoms worsen or if  the condition fails to improve as anticipated.  Nance Pear, NP

## 2019-08-14 NOTE — Patient Instructions (Addendum)
Please change Exforge from 10/320 mg to 5/320mg  once daily. Add HCTZ one tablet by mouth once daily. Please schedule a covid-19 test    1.  text "covid" to [id number]    or   2.  Schedule at [email or web address]   or    3.  Call [phone]

## 2019-08-14 NOTE — Telephone Encounter (Signed)
Patient scheduled for Friday 08-21-19

## 2019-09-11 ENCOUNTER — Other Ambulatory Visit: Payer: Self-pay | Admitting: Family

## 2019-09-11 MED FILL — HYDROCHLOROTHIAZIDE 25 MG T: 25 | 30 days supply | Qty: 30 | Fill #1

## 2019-09-11 MED FILL — AMLODIPINE-VALSARTAN 5-320: 5-320 | 30 days supply | Qty: 30 | Fill #1

## 2019-09-11 MED FILL — ATORVASTATIN 20 MG TABLET: 20 | 90 days supply | Qty: 90 | Fill #0

## 2019-09-22 DIAGNOSIS — K635 Polyp of colon: Secondary | ICD-10-CM | POA: Diagnosis not present

## 2019-09-22 DIAGNOSIS — Z6830 Body mass index (BMI) 30.0-30.9, adult: Secondary | ICD-10-CM | POA: Diagnosis not present

## 2019-09-22 DIAGNOSIS — Z01419 Encounter for gynecological examination (general) (routine) without abnormal findings: Secondary | ICD-10-CM | POA: Diagnosis not present

## 2019-10-13 ENCOUNTER — Other Ambulatory Visit: Payer: Self-pay | Admitting: Family

## 2019-10-13 MED FILL — AMLODIPINE-VALSARTAN 5-320: 5-320 | 30 days supply | Qty: 30 | Fill #2

## 2019-10-13 MED FILL — CARVEDILOL 6.25 MG TABLET: 6.25 | 90 days supply | Qty: 180 | Fill #0

## 2019-10-13 MED FILL — HYDROCHLOROTHIAZIDE 25 MG T: 25 | 30 days supply | Qty: 30 | Fill #2

## 2019-10-13 MED FILL — ARIPIPRAZOLE 10 MG TABS: 10 | 90 days supply | Qty: 90 | Fill #0

## 2019-10-14 DIAGNOSIS — F3181 Bipolar II disorder: Secondary | ICD-10-CM | POA: Diagnosis not present

## 2019-10-16 ENCOUNTER — Other Ambulatory Visit (HOSPITAL_BASED_OUTPATIENT_CLINIC_OR_DEPARTMENT_OTHER): Payer: Self-pay | Admitting: Nurse Practitioner

## 2019-10-16 MED FILL — LAMOTRIGINE 100 MG TABS: 100 | 90 days supply | Qty: 180 | Fill #0

## 2019-10-23 MED FILL — LAMOTRIGINE 100 MG TABS: 100 | 90 days supply | Qty: 180 | Fill #0

## 2019-10-30 ENCOUNTER — Ambulatory Visit: Payer: 59 | Admitting: Physician Assistant

## 2019-10-30 ENCOUNTER — Encounter: Payer: Self-pay | Admitting: Physician Assistant

## 2019-10-30 ENCOUNTER — Other Ambulatory Visit: Payer: Self-pay

## 2019-10-30 DIAGNOSIS — L659 Nonscarring hair loss, unspecified: Secondary | ICD-10-CM

## 2019-10-30 NOTE — Progress Notes (Signed)
   New Patient   Subjective  Kaitlyn Harper is a 59 y.o.AA female who presents for the following: Establish Care and Alopecia (increased hair loss in the last few months).   The following portions of the chart were reviewed this encounter and updated as appropriate: Tobacco  Allergies  Meds  Problems  Med Hx  Surg Hx  Fam Hx      Objective  Well appearing patient in no apparent distress; mood and affect are within normal limits.  A focused examination was performed including scalp. Relevant physical exam findings are noted in the Assessment and Plan.  Objective  Frontal hairline, crown: Hair loss frontal scalp and crown.  Assessment & Plan  Alopecia (2) crown; Frontal hairline    Skin / nail biopsy - crown Type of biopsy: punch   Informed consent: discussed and consent obtained   Procedure prep:  Patient was prepped and draped in usual sterile fashion (nonsterile) Prep type:  Chlorhexidine Anesthesia: the lesion was anesthetized in a standard fashion   Anesthetic:  1% lidocaine w/ epinephrine 1-100,000 local infiltration Punch size:  4 mm Suture size:  4-0 Suture type: nylon   Suture removal (days):  7 Outcome: patient tolerated procedure well   Post-procedure details: wound care instructions given   Post-procedure details comment:  Nonsterile  Skin / nail biopsy - Frontal hairline Type of biopsy: punch   Informed consent: discussed and consent obtained   Procedure prep:  Patient was prepped and draped in usual sterile fashion (nonsterile) Prep type:  Chlorhexidine Anesthesia: the lesion was anesthetized in a standard fashion   Anesthetic:  1% lidocaine w/ epinephrine 1-100,000 local infiltration Punch size:  4 mm Suture size:  4-0 Suture type: nylon   Suture removal (days):  7 Outcome: patient tolerated procedure well   Post-procedure details: wound care instructions given   Post-procedure details comment:  Nonsterile  No FH of hair loss in sisters, mother  or grandmothers. 2 years noticed falling out. No acute loss in clumps, just thinning. No pain or tenderness. No scale or redness. Negative hair pull. Hair loss mostly in the frontal forehead area and crown of scalp. Loss of follicles and multiple hairs in some follicles.   I, Nevin Kozuch, PA-C, have reviewed all documentation for this visit. The documentation on 10/30/19 for the exam, diagnosis, procedures, and orders are all accurate and complete.

## 2019-10-30 NOTE — Patient Instructions (Signed)

## 2019-11-09 ENCOUNTER — Other Ambulatory Visit: Payer: Self-pay

## 2019-11-09 ENCOUNTER — Ambulatory Visit (INDEPENDENT_AMBULATORY_CARE_PROVIDER_SITE_OTHER): Payer: 59 | Admitting: *Deleted

## 2019-11-09 ENCOUNTER — Telehealth: Payer: Self-pay | Admitting: *Deleted

## 2019-11-09 DIAGNOSIS — L659 Nonscarring hair loss, unspecified: Secondary | ICD-10-CM

## 2019-11-09 NOTE — Telephone Encounter (Signed)
Pathology to patient.  °

## 2019-11-09 NOTE — Telephone Encounter (Signed)
Left message for patient to call us back.  

## 2019-11-09 NOTE — Progress Notes (Signed)
Patient here for suture removal x 2; no sign or symptom of infection, pathology to patient.

## 2019-11-09 NOTE — Telephone Encounter (Signed)
-----   Message from Warren Danes, [place] sent at 11/05/2019  9:02 AM EDT ----- Non scarring.  Good thing.  Female pattern hair loss. Start biotin/skin and hair vitamin.  Men's rogaine 2x per day if tolerated.  If irritation, qhs only. Warn slow but will see results .[name]...has to continue to use it to see results.

## 2019-11-11 MED FILL — HYDROCHLOROTHIAZIDE 25 MG T: 25 | 30 days supply | Qty: 30 | Fill #3

## 2019-11-11 MED FILL — traZODone HCL 50 MG TABS: 50 | 90 days supply | Qty: 180 | Fill #0

## 2019-11-11 MED FILL — AMLODIPINE-VALSARTAN 5-320: 5-320 | 30 days supply | Qty: 30 | Fill #3

## 2019-11-13 ENCOUNTER — Other Ambulatory Visit (HOSPITAL_BASED_OUTPATIENT_CLINIC_OR_DEPARTMENT_OTHER): Payer: Self-pay

## 2019-11-13 MED FILL — IBUPROFEN 600 MG TABLET: 600 | 3 days supply | Qty: 12 | Fill #0

## 2019-12-14 ENCOUNTER — Other Ambulatory Visit: Payer: Self-pay | Admitting: Family

## 2019-12-14 MED FILL — AMLODIPINE-VALSARTAN 5-320: 5-320 | 90 days supply | Qty: 90 | Fill #0

## 2019-12-14 MED FILL — ATORVASTATIN CALCIUM 20 MG: 20 | 90 days supply | Qty: 90 | Fill #1

## 2019-12-14 MED FILL — HYDROCHLOROTHIAZIDE 25 MG T: 25 | 90 days supply | Qty: 90 | Fill #0

## 2019-12-25 ENCOUNTER — Other Ambulatory Visit: Payer: Self-pay | Admitting: Medical

## 2019-12-25 ENCOUNTER — Ambulatory Visit: Payer: 59 | Admitting: Medical

## 2019-12-25 ENCOUNTER — Other Ambulatory Visit: Payer: Self-pay

## 2019-12-25 VITALS — BP 130/60 | HR 70 | Temp 98.2°F | Resp 18 | Ht 64.0 in | Wt 172.0 lb

## 2019-12-25 DIAGNOSIS — L299 Pruritus, unspecified: Secondary | ICD-10-CM

## 2019-12-25 DIAGNOSIS — T7840XA Allergy, unspecified, initial encounter: Secondary | ICD-10-CM

## 2019-12-25 DIAGNOSIS — R739 Hyperglycemia, unspecified: Secondary | ICD-10-CM

## 2019-12-25 LAB — HEMOGLOBIN A1C: Hgb A1c MFr Bld: 6.3 % (ref 4.6–6.5)

## 2019-12-25 LAB — COMPREHENSIVE METABOLIC PANEL
ALT: 13 U/L (ref 0–35)
AST: 16 U/L (ref 0–37)
Albumin: 4.2 g/dL (ref 3.5–5.2)
Alkaline Phosphatase: 84 U/L (ref 39–117)
BUN: 19 mg/dL (ref 6–23)
CO2: 29 mEq/L (ref 19–32)
Calcium: 9.8 mg/dL (ref 8.4–10.5)
Chloride: 104 mEq/L (ref 96–112)
Creatinine, Ser: 1.36 mg/dL — ABNORMAL HIGH (ref 0.40–1.20)
GFR: 42.01 mL/min — ABNORMAL LOW (ref >60.00)
Glucose, Bld: 107 mg/dL — ABNORMAL HIGH (ref 70–99)
Potassium: 3.3 mEq/L — ABNORMAL LOW (ref 3.5–5.1)
Sodium: 140 mEq/L (ref 135–145)
Total Bilirubin: 0.5 mg/dL (ref 0.2–1.2)
Total Protein: 7.3 g/dL (ref 6.0–8.3)

## 2019-12-25 MED ORDER — TRIAMCINOLONE ACETONIDE 0.025 % EX OINT: 1.0000 "application " | TOPICAL_OINTMENT | Freq: Two times a day (BID) | CUTANEOUS | 0 refills | Status: DC

## 2019-12-25 MED FILL — TRIAMCINOLONE 0.025% OINT: 0.025 | 15 days supply | Qty: 30 | Fill #0

## 2019-12-25 NOTE — Progress Notes (Signed)
Subjective:    Patient ID: Kaitlyn Harper, female    DOB: [birth year], 59 y.o.   MRN: [id number]  HPI  Pt in with rash on her left distal forearm then later got on rt forearm, lower ext and upper chest. Rash is itching. She notes does burn when she takes a shower.   No correlation of rash with creams, soaps, detergents, new meds, anima eposure, outdoor expsosure.  In records states diabetic but on checking pt a1c last 3 are 5.9, 5.7 and 5/7. Pt is not on any diabetic meds.   Pt states small red bumps occurred on forearm, wrist and lower pretibial area. Those areas now better. Only has chest rash now.  Rash present for 5 days.   Review of Systems  Constitutional: Negative for chills, fatigue and fever.  Respiratory: Negative for cough, chest tightness, shortness of breath and wheezing.   Cardiovascular: Negative for chest pain.  Gastrointestinal: Negative for abdominal pain.  Skin: Positive for rash.  Hematological: Negative for adenopathy. Does not bruise/bleed easily.  Psychiatric/Behavioral: Negative for behavioral problems and suicidal ideas. The patient is not nervous/anxious.     Past Medical History:  Diagnosis Date  . Anemia    resolved   . Anxiety   . Bipolar disorder ([place])   . Depression   . Diabetes mellitus without complication ([place])    type 2-diet controlled  . Diverticulitis   . Hyperglycemia   . Hyperlipidemia   . Hypertension   . NSTEMI (non-ST elevated myocardial infarction) ([place]) 2018   reports she fainted at his sons house and they took her to ED  , troponin was elevated and cardiac cath was completed,  no stent placed nor hx of stent intervention , she reports no casue was ever determined for the sycope episode   . Syncope 03/18/2016   reports no recurrence      Social History   Socioeconomic History  . Marital status: Single    Spouse name: Not on file  . Number of children: 3  . Years of education: Not on file  . Highest education level:  Not on file  Occupational History  . Occupation: Water quality scientist: [employer]  Tobacco Use  . Smoking status: Current Some Day Smoker    Packs/day: 0.25    Types: Cigarettes    Last attempt to quit: 07/02/2015    Years since quitting: 4.4  . Smokeless tobacco: Never Used  . Tobacco comment: 10 cigarettes daily; 10-30 reports " one to two cigarettes every now and again"   Substance and Sexual Activity  . Alcohol use: Yes    Alcohol/week: 0.0 standard drinks    Comment: socially  . Drug use: No  . Sexual activity: Yes    Birth control/protection: Post-menopausal  Other Topics Concern  . Not on file  Social History Narrative   Caffeine use:  2 drinks   Regular exercise: no   Lives with her daughter   3 children    2 grandchildren   1 great grandson   Social Determinants of Radio broadcast assistant Strain:   . Difficulty of Paying Living Expenses: Not on file  Food Insecurity:   . Worried About Charity fundraiser in the Last Year: Not on file  . Ran Out of Food in the Last Year: Not on file  Transportation Needs:   . Lack of Transportation (Medical): Not on file  . Lack of Transportation (Non-Medical): Not  on file  Physical Activity:   . Days of Exercise per Week: Not on file  . Minutes of Exercise per Session: Not on file  Stress:   . Feeling of Stress : Not on file  Social Connections:   . Frequency of Communication with Friends and Family: Not on file  . Frequency of Social Gatherings with Friends and Family: Not on file  . Attends Religious Services: Not on file  . Active Member of Clubs or Organizations: Not on file  . Attends Archivist Meetings: Not on file  . Marital Status: Not on file  Intimate Partner Violence:   . Fear of Current or Ex-Partner: Not on file  . Emotionally Abused: Not on file  . Physically Abused: Not on file  . Sexually Abused: Not on file    Past Surgical History:  Procedure Laterality Date  .  APPENDECTOMY  1985  . CHOLECYSTECTOMY  1985  . COLONOSCOPY W/ POLYPECTOMY  03/01/2015   Dr Oretha Caprice, [facility].  +adenomatous polyps  . COLONOSCOPY W/ POLYPECTOMY  02/28/2011   4 polyps - 3 TA  . DILATION AND CURETTAGE OF UTERUS  2005  . [place]  2002  . KNEE SURGERY  1983   arthroscopy?  [name] LEFT HEART CATH AND CORONARY ANGIOGRAPHY N/A 03/19/2016   Procedure: Left Heart Cath and Coronary Angiography;  Surgeon: Peter M Martinique, MD;  Location: [facility];  Service: Cardiovascular;  Laterality: N/A;  . MOUTH SURGERY  03/08/2016  . POLYPECTOMY  2006   ?anal polyp?  Kathee Polite DUCT PROBING  2004  . TUBAL LIGATION  1985    Family History  Problem Relation Age of Onset  . Hypertension Father   . Sarcoidosis Brother   . Alcohol abuse Brother   . Cirrhosis Brother   . Stroke Maternal Grandmother   . Colon cancer Neg Hx   . Colon polyps Neg Hx   . Esophageal cancer Neg Hx   . Rectal cancer Neg Hx   . Stomach cancer Neg Hx   . Pulmonary embolism Neg Hx     Allergies  Allergen Reactions  . Sulfonamide Derivatives Hives and Other (See Comments)    Light sensitivity    Current Outpatient Medications on File Prior to Visit  Medication Sig Dispense Refill  . acetaminophen (TYLENOL) 325 MG tablet Take 2 tablets (650 mg total) by mouth every 6 (six) hours as needed for mild pain (or Fever >/= 101).    [name] amLODipine-valsartan (EXFORGE) 5-320 MG tablet Take 1 tablet by mouth daily. 90 tablet 0  . ARIPiprazole (ABILIFY) 10 MG tablet Take 10 mg by mouth daily after breakfast.   2  . aspirin EC 81 MG tablet Take 81 mg by mouth daily.    [name] atorvastatin (LIPITOR) 20 MG tablet TAKE 1 TABLET BY MOUTH ONCE DAILY 90 tablet 1  . carvedilol (COREG) 6.25 MG tablet TAKE 1 TABLET BY MOUTH TWICE DAILY WITH A MEAL 180 tablet 1  . hydrochlorothiazide (HYDRODIURIL) 25 MG tablet Take 1 tablet (25 mg total) by mouth daily. 90 tablet 0  . lamoTRIgine (LAMICTAL) 100 MG tablet Take 100 mg by mouth  2 (two) times daily.   0  . psyllium (METAMUCIL) 58.6 % powder Take 1 packet by mouth 3 (three) times daily.    [name] saccharomyces boulardii (FLORASTOR) 250 MG capsule Take 1 capsule (250 mg total) by mouth 2 (two) times daily. You can find a probiotic over the counter.    [name]  traZODone (DESYREL) 50 MG tablet Take 100-200 mg by mouth at bedtime.   2  . benzonatate (TESSALON) 100 MG capsule Take 1 capsule (100 mg total) by mouth 3 (three) times daily as needed. (Patient not taking: Reported on 12/25/2019) 20 capsule 0  . [DISCONTINUED] amLODipine-valsartan (EXFORGE) 10-320 MG tablet TAKE 1 TABLET DAILY BY MOUTH. 90 tablet 1  . [DISCONTINUED] carvedilol (COREG) 6.25 MG tablet TAKE 1 TABLET BY MOUTH TWICE DAILY WITH A MEAL 180 tablet 1   No current facility-administered medications on file prior to visit.    BP 130/60   Pulse 70   Temp 98.2 F (36.8 C)   Resp 18   Ht 5\' 4"  (1.626 m)   Wt 172 lb (78 kg)   LMP 12/25/2008   SpO2 100%   BMI 29.52 kg/m      Objective:   Physical Exam   General- No acute distress. Pleasant patient. Neck- Full range of motion, no jvd Lungs- Clear, even and unlabored. Heart- regular rate and rhythm. Neurologic- CNII- XII grossly intact.  Skin- on wrist/distal forearm some mild hyperpigmented skin. Same on lower ext/ distal preticial area. On her up chest scattered papular rash across entire chest/passes midline.Note area on ext also looked papular per pt.       Assessment & Plan:  You do appear to have potential allergic reaction but cause undetermined. Will rx triamcinolone to use twice a day for 5-7 days. Can use benadryl otc by mouth every 8 hours as needed for itching. Advisement given on benadryl use/just at night if makes too drowsy or not severe day time itching.  If rash recurrrent or persistent despite above then may refer to allergist or dermatologist.  Will get cmp and a1c today as well. Hx of low a1c that I can see but maybe remote diabetes  that is well controlled. Want to evaluate recent sugar average in event low dose medrol required.  Follow up 7 days or as needed.

## 2019-12-25 NOTE — Patient Instructions (Signed)
You do appear to have potential allergic reaction but cause undetermined. Will rx triamcinolone to use twice a day for 5-7 days. Can use benadryl otc by mouth every 8 hours as needed for itching. Advisement given on benadryl use/just at night if makes too drowsy or not severe day time itching.  If rash recurrrent or persistent despite above then may refer to allergist or dermatologist.  Will get cmp and a1c today as well. Hx of low a1c that I can see but maybe remote diabetes that is well controlled. Want to evaluate recent sugar average in event low dose medrol required.  Follow up 7 days or as needed.

## 2019-12-26 ENCOUNTER — Telehealth: Payer: Self-pay | Admitting: Medical

## 2019-12-26 MED ORDER — POTASSIUM CHLORIDE ER 10 MEQ PO TBCR: 10.0000 meq | EXTENDED_RELEASE_TABLET | Freq: Every day | ORAL | 0 refills | Status: DC

## 2019-12-26 NOTE — Telephone Encounter (Signed)
Rx k-dur sent to pt pharmacy. 

## 2019-12-28 MED FILL — POTASSIUM CHLORIDE ER 10 ME: 10 | 7 days supply | Qty: 7 | Fill #0

## 2020-01-01 ENCOUNTER — Ambulatory Visit: Payer: 59 | Admitting: Family

## 2020-01-01 ENCOUNTER — Encounter: Payer: Self-pay | Admitting: Family

## 2020-01-01 ENCOUNTER — Other Ambulatory Visit: Payer: Self-pay

## 2020-01-01 VITALS — BP 136/71 | HR 81 | Temp 98.1°F | Resp 16 | Wt 170.0 lb

## 2020-01-01 DIAGNOSIS — L509 Urticaria, unspecified: Secondary | ICD-10-CM | POA: Diagnosis not present

## 2020-01-01 NOTE — Patient Instructions (Addendum)
Please add zyrtec 10mg  once daily. Continue triamcinolone cream to affected area twice daily as needed. Please call if rash worsens or if rash does not improve.

## 2020-01-01 NOTE — Progress Notes (Signed)
Subjective:    Patient ID: Kaitlyn Harper, female    DOB: November 12, 1960, 59 y.o.   MRN: [id number]  HPI  Patient is a 59 yr old female who presents today for follow up of her rash.  She saw Evern Core PA-C on 12/25/19. She was prescribed triamcinolone cream.  She states that some of the lesions have resolved but she has a new lesion inside her right forearm and under her chin. States that these lesions are pruritic. She denies tongue/lip swelling, shortness or breath or wheezing.   Review of Systems See HPI  Past Medical History:  Diagnosis Date  . Anemia    resolved   . Anxiety   . Bipolar disorder ([place])   . Depression   . Diabetes mellitus without complication ([place])    type 2-diet controlled  . Diverticulitis   . Hyperglycemia   . Hyperlipidemia   . Hypertension   . NSTEMI (non-ST elevated myocardial infarction) ([place]) 2018   reports she fainted at his sons house and they took her to ED  , troponin was elevated and cardiac cath was completed,  no stent placed nor hx of stent intervention , she reports no casue was ever determined for the sycope episode   . Syncope 03/18/2016   reports no recurrence      Social History   Socioeconomic History  . Marital status: Single    Spouse name: Not on file  . Number of children: 3  . Years of education: Not on file  . Highest education level: Not on file  Occupational History  . Occupation: Water quality scientist: [employer]  Tobacco Use  . Smoking status: Current Some Day Smoker    Packs/day: 0.25    Types: Cigarettes    Last attempt to quit: 07/02/2015    Years since quitting: 4.5  . Smokeless tobacco: Never Used  . Tobacco comment: 10 cigarettes daily; 10-30 reports " one to two cigarettes every now and again"   Substance and Sexual Activity  . Alcohol use: Yes    Alcohol/week: 0.0 standard drinks    Comment: socially  . Drug use: No  . Sexual activity: Yes    Birth control/protection: Post-menopausal    Other Topics Concern  . Not on file  Social History Narrative   Caffeine use:  2 drinks   Regular exercise: no   Lives with her daughter   3 children    2 grandchildren   1 great grandson   Social Determinants of Radio broadcast assistant Strain:   . Difficulty of Paying Living Expenses: Not on file  Food Insecurity:   . Worried About Charity fundraiser in the Last Year: Not on file  . Ran Out of Food in the Last Year: Not on file  Transportation Needs:   . Lack of Transportation (Medical): Not on file  . Lack of Transportation (Non-Medical): Not on file  Physical Activity:   . Days of Exercise per Week: Not on file  . Minutes of Exercise per Session: Not on file  Stress:   . Feeling of Stress : Not on file  Social Connections:   . Frequency of Communication with Friends and Family: Not on file  . Frequency of Social Gatherings with Friends and Family: Not on file  . Attends Religious Services: Not on file  . Active Member of Clubs or Organizations: Not on file  . Attends Archivist Meetings: Not on  file  . Marital Status: Not on file  Intimate Partner Violence:   . Fear of Current or Ex-Partner: Not on file  . Emotionally Abused: Not on file  . Physically Abused: Not on file  . Sexually Abused: Not on file    Past Surgical History:  Procedure Laterality Date  . APPENDECTOMY  1985  . CHOLECYSTECTOMY  1985  . COLONOSCOPY W/ POLYPECTOMY  03/01/2015   Dr Oretha Caprice, [facility].  +adenomatous polyps  . COLONOSCOPY W/ POLYPECTOMY  02/28/2011   4 polyps - 3 TA  . DILATION AND CURETTAGE OF UTERUS  2005  . [place]  2002  . KNEE SURGERY  1983   arthroscopy?  [name] LEFT HEART CATH AND CORONARY ANGIOGRAPHY N/A 03/19/2016   Procedure: Left Heart Cath and Coronary Angiography;  Surgeon: Peter M Martinique, MD;  Location: [facility];  Service: Cardiovascular;  Laterality: N/A;  . MOUTH SURGERY  03/08/2016  . POLYPECTOMY  2006   ?anal polyp?  Kathee Polite  DUCT PROBING  2004  . TUBAL LIGATION  1985    Family History  Problem Relation Age of Onset  . Hypertension Father   . Sarcoidosis Brother   . Alcohol abuse Brother   . Cirrhosis Brother   . Stroke Maternal Grandmother   . Colon cancer Neg Hx   . Colon polyps Neg Hx   . Esophageal cancer Neg Hx   . Rectal cancer Neg Hx   . Stomach cancer Neg Hx   . Pulmonary embolism Neg Hx     Allergies  Allergen Reactions  . Sulfonamide Derivatives Hives and Other (See Comments)    Light sensitivity    Current Outpatient Medications on File Prior to Visit  Medication Sig Dispense Refill  . acetaminophen (TYLENOL) 325 MG tablet Take 2 tablets (650 mg total) by mouth every 6 (six) hours as needed for mild pain (or Fever >/= 101).    [name] amLODipine-valsartan (EXFORGE) 5-320 MG tablet Take 1 tablet by mouth daily. 90 tablet 0  . ARIPiprazole (ABILIFY) 10 MG tablet Take 10 mg by mouth daily after breakfast.   2  . aspirin EC 81 MG tablet Take 81 mg by mouth daily.    [name] atorvastatin (LIPITOR) 20 MG tablet TAKE 1 TABLET BY MOUTH ONCE DAILY 90 tablet 1  . benzonatate (TESSALON) 100 MG capsule Take 1 capsule (100 mg total) by mouth 3 (three) times daily as needed. 20 capsule 0  . carvedilol (COREG) 6.25 MG tablet TAKE 1 TABLET BY MOUTH TWICE DAILY WITH A MEAL 180 tablet 1  . hydrochlorothiazide (HYDRODIURIL) 25 MG tablet Take 1 tablet (25 mg total) by mouth daily. 90 tablet 0  . lamoTRIgine (LAMICTAL) 100 MG tablet Take 100 mg by mouth 2 (two) times daily.   0  . potassium chloride (KLOR-CON) 10 MEQ tablet Take 1 tablet (10 mEq total) by mouth daily. 7 tablet 0  . psyllium (METAMUCIL) 58.6 % powder Take 1 packet by mouth 3 (three) times daily.    [name] saccharomyces boulardii (FLORASTOR) 250 MG capsule Take 1 capsule (250 mg total) by mouth 2 (two) times daily. You can find a probiotic over the counter.    . traZODone (DESYREL) 50 MG tablet Take 100-200 mg by mouth at bedtime.   2  . triamcinolone  (KENALOG) 0.025 % ointment Apply 1 application topically 2 (two) times daily. 30 g 0  . [DISCONTINUED] amLODipine-valsartan (EXFORGE) 10-320 MG tablet TAKE 1 TABLET DAILY BY MOUTH. 90 tablet  1  . [DISCONTINUED] carvedilol (COREG) 6.25 MG tablet TAKE 1 TABLET BY MOUTH TWICE DAILY WITH A MEAL 180 tablet 1   No current facility-administered medications on file prior to visit.    BP 136/71 (BP Location: Right Arm, Patient Position: Sitting, Cuff Size: Small)   Pulse 81   Temp 98.1 F (36.7 C) (Temporal)   Resp 16   Wt 170 lb (77.1 kg)   LMP 12/25/2008   SpO2 100%   BMI 29.18 kg/m       Objective:   Physical Exam Constitutional:      Appearance: She is well-developed.  Neck:     Thyroid: No thyromegaly.  Cardiovascular:     Rate and Rhythm: Normal rate and regular rhythm.     Heart sounds: Normal heart sounds. No murmur heard.   Pulmonary:     Effort: Pulmonary effort is normal. No respiratory distress.     Breath sounds: Normal breath sounds. No wheezing.  Musculoskeletal:     Cervical back: Neck supple.  Skin:    General: Skin is warm and dry.     Comments: Raised lesion with some surrounding erythema inside right forearm  Several hyperpigmented spots inside right wrist at site of previous lesions. Small pea sized superficial nodular lesion noted beneath chin.   Neurological:     Mental Status: She is alert and oriented to person, place, and time.  Psychiatric:        Behavior: Behavior normal.        Thought Content: Thought content normal.        Judgment: Judgment normal.         Assessment & Plan:  Hives- she only has a few lesions. I would like to avoid systemic steroids unless her symptoms worsen. Recommended the following:  Please add zyrtec 10mg  once daily. Continue triamcinolone cream to affected area twice daily as needed. Please call if rash worsens or if rash does not improve. Pt verbalizes understanding.  22 minutes spent on today's visit. Time was  spent examining patient, formulating treatment plan, reviewing chart and counseling patient.  This visit occurred during the SARS-CoV-2 public health emergency.  Safety protocols were in place, including screening questions prior to the visit, additional usage of staff PPE, and extensive cleaning of exam room while observing appropriate contact time as indicated for disinfecting solutions.

## 2020-01-28 ENCOUNTER — Other Ambulatory Visit (HOSPITAL_BASED_OUTPATIENT_CLINIC_OR_DEPARTMENT_OTHER): Payer: Self-pay

## 2020-01-28 MED FILL — LAMOTRIGINE 100 MG TABS: 100 | 90 days supply | Qty: 180 | Fill #1

## 2020-01-28 MED FILL — ARIPIPRAZOLE 10 MG TABS: 10 | 90 days supply | Qty: 90 | Fill #1

## 2020-01-28 MED FILL — CARVEDILOL 6.25 MG TABLET: 6.25 | 90 days supply | Qty: 180 | Fill #1

## 2020-01-29 ENCOUNTER — Ambulatory Visit (INDEPENDENT_AMBULATORY_CARE_PROVIDER_SITE_OTHER): Payer: 59 | Admitting: Family

## 2020-01-29 ENCOUNTER — Other Ambulatory Visit: Payer: Self-pay

## 2020-01-29 VITALS — BP 138/74 | HR 83 | Temp 98.6°F | Ht 64.0 in | Wt 166.4 lb

## 2020-01-29 DIAGNOSIS — L509 Urticaria, unspecified: Secondary | ICD-10-CM

## 2020-01-29 DIAGNOSIS — Z0000 Encounter for general adult medical examination without abnormal findings: Secondary | ICD-10-CM

## 2020-01-29 MED FILL — PREVIDENT 0.2% RINSE: 0.2 | 47 days supply | Qty: 473 | Fill #0

## 2020-01-29 NOTE — Progress Notes (Signed)
Subjective:    Patient ID: Kaitlyn Harper, female    DOB: [birth year], 59 y.o.   MRN: [id number]  HPI  Patient is a 59 yr old female who presents today for follow up. Last visit she reported hives.  She started zyrtec which she has found helpful.  She believes that she has been experiencing bug bites as a result of bringing some outdoor plants inside.     Review of Systems     Past Medical History:  Diagnosis Date  . Anemia    resolved   . Anxiety   . Bipolar disorder ([place])   . Depression   . Diabetes mellitus without complication ([place])    type 2-diet controlled  . Diverticulitis   . Hyperglycemia   . Hyperlipidemia   . Hypertension   . NSTEMI (non-ST elevated myocardial infarction) ([place]) 2018   reports she fainted at his sons house and they took her to ED  , troponin was elevated and cardiac cath was completed,  no stent placed nor hx of stent intervention , she reports no casue was ever determined for the sycope episode   . Syncope 03/18/2016   reports no recurrence      Social History   Socioeconomic History  . Marital status: Single    Spouse name: Not on file  . Number of children: 3  . Years of education: Not on file  . Highest education level: Not on file  Occupational History  . Occupation: Water quality scientist: [employer]  Tobacco Use  . Smoking status: Current Some Day Smoker    Packs/day: 0.25    Types: Cigarettes    Last attempt to quit: 07/02/2015    Years since quitting: 4.5  . Smokeless tobacco: Never Used  . Tobacco comment: 10 cigarettes daily; 10-30 reports " one to two cigarettes every now and again"   Substance and Sexual Activity  . Alcohol use: Yes    Alcohol/week: 0.0 standard drinks    Comment: socially  . Drug use: No  . Sexual activity: Yes    Birth control/protection: Post-menopausal  Other Topics Concern  . Not on file  Social History Narrative   Caffeine use:  2 drinks   Regular exercise: no   Lives with her  daughter   3 children    2 grandchildren   1 great grandson   Social Determinants of Radio broadcast assistant Strain: Not on file  Food Insecurity: Not on file  Transportation Needs: Not on file  Physical Activity: Not on file  Stress: Not on file  Social Connections: Not on file  Intimate Partner Violence: Not on file    Past Surgical History:  Procedure Laterality Date  . APPENDECTOMY  1985  . CHOLECYSTECTOMY  1985  . COLONOSCOPY W/ POLYPECTOMY  03/01/2015   Dr Oretha Caprice, [facility].  +adenomatous polyps  . COLONOSCOPY W/ POLYPECTOMY  02/28/2011   4 polyps - 3 TA  . DILATION AND CURETTAGE OF UTERUS  2005  . [place]  2002  . KNEE SURGERY  1983   arthroscopy?  [name] LEFT HEART CATH AND CORONARY ANGIOGRAPHY N/A 03/19/2016   Procedure: Left Heart Cath and Coronary Angiography;  Surgeon: Peter M Martinique, MD;  Location: [facility];  Service: Cardiovascular;  Laterality: N/A;  . MOUTH SURGERY  03/08/2016  . POLYPECTOMY  2006   ?anal polyp?  Kathee Polite DUCT PROBING  2004  . TUBAL LIGATION  1985    Family History  Problem Relation Age of Onset  . Hypertension Father   . Sarcoidosis Brother   . Alcohol abuse Brother   . Cirrhosis Brother   . Stroke Maternal Grandmother   . Colon cancer Neg Hx   . Colon polyps Neg Hx   . Esophageal cancer Neg Hx   . Rectal cancer Neg Hx   . Stomach cancer Neg Hx   . Pulmonary embolism Neg Hx     Allergies  Allergen Reactions  . Sulfonamide Derivatives Hives and Other (See Comments)    Light sensitivity    Current Outpatient Medications on File Prior to Visit  Medication Sig Dispense Refill  . acetaminophen (TYLENOL) 325 MG tablet Take 2 tablets (650 mg total) by mouth every 6 (six) hours as needed for mild pain (or Fever >/= 101).    [name] amLODipine-valsartan (EXFORGE) 5-320 MG tablet Take 1 tablet by mouth daily. 90 tablet 0  . ARIPiprazole (ABILIFY) 10 MG tablet Take 10 mg by mouth daily after breakfast.   2  . aspirin  EC 81 MG tablet Take 81 mg by mouth daily.    [name] atorvastatin (LIPITOR) 20 MG tablet TAKE 1 TABLET BY MOUTH ONCE DAILY 90 tablet 1  . benzonatate (TESSALON) 100 MG capsule Take 1 capsule (100 mg total) by mouth 3 (three) times daily as needed. 20 capsule 0  . carvedilol (COREG) 6.25 MG tablet TAKE 1 TABLET BY MOUTH TWICE DAILY WITH A MEAL 180 tablet 1  . hydrochlorothiazide (HYDRODIURIL) 25 MG tablet Take 1 tablet (25 mg total) by mouth daily. 90 tablet 0  . lamoTRIgine (LAMICTAL) 100 MG tablet Take 100 mg by mouth 2 (two) times daily.   0  . potassium chloride (KLOR-CON) 10 MEQ tablet Take 1 tablet (10 mEq total) by mouth daily. 7 tablet 0  . psyllium (METAMUCIL) 58.6 % powder Take 1 packet by mouth 3 (three) times daily.    [name] saccharomyces boulardii (FLORASTOR) 250 MG capsule Take 1 capsule (250 mg total) by mouth 2 (two) times daily. You can find a probiotic over the counter.    . traZODone (DESYREL) 50 MG tablet Take 100-200 mg by mouth at bedtime.   2  . triamcinolone (KENALOG) 0.025 % ointment Apply 1 application topically 2 (two) times daily. 30 g 0  . [DISCONTINUED] amLODipine-valsartan (EXFORGE) 10-320 MG tablet TAKE 1 TABLET DAILY BY MOUTH. 90 tablet 1  . [DISCONTINUED] carvedilol (COREG) 6.25 MG tablet TAKE 1 TABLET BY MOUTH TWICE DAILY WITH A MEAL 180 tablet 1   No current facility-administered medications on file prior to visit.    BP 138/74 (BP Location: Right Arm, Patient Position: Sitting, Cuff Size: Large)   Pulse 83   Temp 98.6 F (37 C) (Oral)   Ht 5\' 4"  (1.626 m)   Wt 166 lb 6.4 oz (75.5 kg)   LMP 12/25/2008   SpO2 98%   BMI 28.56 kg/m    Objective:   Physical Exam Constitutional:      Appearance: Normal appearance.  HENT:     Head: Normocephalic and atraumatic.  Skin:    Comments: One small raised lesion noted on right lower chin.   Neurological:     General: No focal deficit present.     Mental Status: She is alert and oriented to person, place, and time.   Psychiatric:        Mood and Affect: Mood normal.        Behavior: Behavior normal.  Assessment & Plan:  Hives- improving. I am not certain that these lesions represent insect bites, but she does feel that they are improving  I we did discuss referral to an allergist. She would like to hold off for now and will let me know if she changes her mind later.  Continue zyrtec 10mg  once daily.  She is due for mammogram- referral is placed.   This visit occurred during the SARS-CoV-2 public health emergency.  Safety protocols were in place, including screening questions prior to the visit, additional usage of staff PPE, and extensive cleaning of exam room while observing appropriate contact time as indicated for disinfecting solutions.

## 2020-01-29 NOTE — Patient Instructions (Addendum)
Please call Dr. Ardis Hughs to schedule a colonoscopy.  Please schedule your mammogram. Continue zyrtec 10mg  once daily.  Please get your covid booster.

## 2020-02-22 MED FILL — traZODone HCL 50 MG TABS: 50 | 90 days supply | Qty: 180 | Fill #1

## 2020-03-16 DIAGNOSIS — F3181 Bipolar II disorder: Secondary | ICD-10-CM | POA: Diagnosis not present

## 2020-03-18 ENCOUNTER — Other Ambulatory Visit: Payer: Self-pay | Admitting: Family

## 2020-03-18 MED FILL — PREVIDENT 0.2% RINSE: 0.2 | 47 days supply | Qty: 473 | Fill #1

## 2020-03-18 MED FILL — AMLODIPINE-VALSARTAN 5-320: 5-320 | 90 days supply | Qty: 90 | Fill #0

## 2020-03-18 MED FILL — ATORVASTATIN CALCIUM 20 MG: 20 | 90 days supply | Qty: 90 | Fill #0

## 2020-03-18 MED FILL — HYDROCHLOROTHIAZIDE 25 MG T: 25 | 90 days supply | Qty: 90 | Fill #0

## 2020-03-20 ENCOUNTER — Other Ambulatory Visit (HOSPITAL_BASED_OUTPATIENT_CLINIC_OR_DEPARTMENT_OTHER): Payer: Self-pay | Admitting: Nurse Practitioner

## 2020-03-21 ENCOUNTER — Ambulatory Visit: Payer: 59 | Attending: Internal Medicine

## 2020-03-21 ENCOUNTER — Other Ambulatory Visit (HOSPITAL_BASED_OUTPATIENT_CLINIC_OR_DEPARTMENT_OTHER): Payer: Self-pay | Admitting: Internal Medicine

## 2020-03-21 DIAGNOSIS — Z23 Encounter for immunization: Secondary | ICD-10-CM

## 2020-03-21 MED FILL — PFIZER-BIONT COVID-19 VAC-T: 30 | 21 days supply | Qty: 2 | Fill #0

## 2020-03-21 NOTE — Progress Notes (Signed)
   Covid-19 Vaccination Clinic  Name:  Kaitlyn Harper    MRN: [id number] DOB: 04-13-1960  03/21/2020  [name] was observed post Covid-19 immunization for 15 minutes without incident. She was provided with Vaccine Information Sheet and instruction to access the V-Safe system.   [name] was instructed to call 911 with any severe reactions post vaccine: [name] Difficulty breathing  . Swelling of face and throat  . A fast heartbeat  . A bad rash all over body  . Dizziness and weakness   Immunizations Administered    Name Date Dose VIS Date Route   PFIZER Comrnaty(Gray TOP) Covid-19 Vaccine 03/21/2020  2:31 PM 0.3 mL 01/21/2020 Intramuscular   Manufacturer: [employer]   Lot: [id number]   NDC: [phone]

## 2020-04-29 ENCOUNTER — Telehealth: Payer: Self-pay | Admitting: Family

## 2020-04-29 ENCOUNTER — Ambulatory Visit: Payer: 59 | Admitting: Family

## 2020-04-29 ENCOUNTER — Other Ambulatory Visit: Payer: Self-pay | Admitting: Family

## 2020-04-29 ENCOUNTER — Other Ambulatory Visit: Payer: Self-pay

## 2020-04-29 ENCOUNTER — Encounter: Payer: Self-pay | Admitting: Family

## 2020-04-29 DIAGNOSIS — Z23 Encounter for immunization: Secondary | ICD-10-CM | POA: Diagnosis not present

## 2020-04-29 DIAGNOSIS — E119 Type 2 diabetes mellitus without complications: Secondary | ICD-10-CM

## 2020-04-29 DIAGNOSIS — Z0000 Encounter for general adult medical examination without abnormal findings: Secondary | ICD-10-CM

## 2020-04-29 DIAGNOSIS — I10 Essential (primary) hypertension: Secondary | ICD-10-CM | POA: Diagnosis not present

## 2020-04-29 DIAGNOSIS — E782 Mixed hyperlipidemia: Secondary | ICD-10-CM | POA: Diagnosis not present

## 2020-04-29 NOTE — Progress Notes (Signed)
Subjective:    Patient ID: Kaitlyn Harper, female    DOB: 1960/07/09, 60 y.o.   MRN: [id number]  HPI  Patient is a 60 yr old female who presents today for follow up.  HTN-  BP Readings from Last 3 Encounters:  04/29/20 117/68  01/29/20 138/74  01/01/20 136/71   DM2-  Wt Readings from Last 3 Encounters:  04/29/20 158 lb (71.7 kg)  01/29/20 166 lb 6.4 oz (75.5 kg)  01/01/20 170 lb (77.1 kg)    Lab Results  Component Value Date   HGBA1C 6.3 12/25/2019   HGBA1C 5.7 (H) 07/17/2019   HGBA1C 5.7 (H) 12/12/2018   Lab Results  Component Value Date   MICROALBUR 1.8 08/02/2015   LDLCALC 76 07/17/2019   CREATININE 1.36 (H) 12/25/2019   Hypokalemia- Pt did not start K supplement.   Review of Systems See HPI  Past Medical History:  Diagnosis Date  . Anemia    resolved   . Anxiety   . Bipolar disorder ([place])   . Depression   . Diabetes mellitus without complication ([place])    type 2-diet controlled  . Diverticulitis   . Hyperglycemia   . Hyperlipidemia   . Hypertension   . NSTEMI (non-ST elevated myocardial infarction) ([place]) 2018   reports she fainted at his sons house and they took her to ED  , troponin was elevated and cardiac cath was completed,  no stent placed nor hx of stent intervention , she reports no casue was ever determined for the sycope episode   . Syncope 03/18/2016   reports no recurrence      Social History   Socioeconomic History  . Marital status: Single    Spouse name: Not on file  . Number of children: 3  . Years of education: Not on file  . Highest education level: Not on file  Occupational History  . Occupation: Water quality scientist: [employer]  Tobacco Use  . Smoking status: Current Some Day Smoker    Packs/day: 0.25    Types: Cigarettes    Last attempt to quit: 07/02/2015    Years since quitting: 4.8  . Smokeless tobacco: Never Used  . Tobacco comment: 10 cigarettes daily; 10-30 reports " one to two cigarettes every now  and again"   Substance and Sexual Activity  . Alcohol use: Yes    Alcohol/week: 0.0 standard drinks    Comment: socially  . Drug use: No  . Sexual activity: Yes    Birth control/protection: Post-menopausal  Other Topics Concern  . Not on file  Social History Narrative   Caffeine use:  2 drinks   Regular exercise: no   Lives with her daughter   3 children    2 grandchildren   1 great grandson   Social Determinants of Radio broadcast assistant Strain: Not on file  Food Insecurity: Not on file  Transportation Needs: Not on file  Physical Activity: Not on file  Stress: Not on file  Social Connections: Not on file  Intimate Partner Violence: Not on file    Past Surgical History:  Procedure Laterality Date  . APPENDECTOMY  1985  . CHOLECYSTECTOMY  1985  . COLONOSCOPY W/ POLYPECTOMY  03/01/2015   Dr Oretha Caprice, [facility].  +adenomatous polyps  . COLONOSCOPY W/ POLYPECTOMY  02/28/2011   4 polyps - 3 TA  . DILATION AND CURETTAGE OF UTERUS  2005  . [place]  2002  .  KNEE SURGERY  1983   arthroscopy?  [name] LEFT HEART CATH AND CORONARY ANGIOGRAPHY N/A 03/19/2016   Procedure: Left Heart Cath and Coronary Angiography;  Surgeon: Peter M Martinique, MD;  Location: [facility];  Service: Cardiovascular;  Laterality: N/A;  . MOUTH SURGERY  03/08/2016  . POLYPECTOMY  2006   ?anal polyp?  Kathee Polite DUCT PROBING  2004  . TUBAL LIGATION  1985    Family History  Problem Relation Age of Onset  . Hypertension Father   . Sarcoidosis Brother   . Alcohol abuse Brother   . Cirrhosis Brother   . Stroke Maternal Grandmother   . Colon cancer Neg Hx   . Colon polyps Neg Hx   . Esophageal cancer Neg Hx   . Rectal cancer Neg Hx   . Stomach cancer Neg Hx   . Pulmonary embolism Neg Hx     Allergies  Allergen Reactions  . Sulfonamide Derivatives Hives and Other (See Comments)    Light sensitivity    Current Outpatient Medications on File Prior to Visit  Medication Sig  Dispense Refill  . acetaminophen (TYLENOL) 325 MG tablet Take 2 tablets (650 mg total) by mouth every 6 (six) hours as needed for mild pain (or Fever >/= 101).    [name] amLODipine-valsartan (EXFORGE) 5-320 MG tablet Take 1 tablet by mouth daily. 90 tablet 1  . ARIPiprazole (ABILIFY) 10 MG tablet Take 10 mg by mouth daily after breakfast.   2  . aspirin EC 81 MG tablet Take 81 mg by mouth daily.    [name] atorvastatin (LIPITOR) 20 MG tablet Take 1 tablet (20 mg total) by mouth daily. 90 tablet 1  . carvedilol (COREG) 6.25 MG tablet TAKE 1 TABLET BY MOUTH TWICE DAILY WITH A MEAL 180 tablet 1  . lamoTRIgine (LAMICTAL) 100 MG tablet Take 100 mg by mouth 2 (two) times daily.   0  . psyllium (METAMUCIL) 58.6 % powder Take 1 packet by mouth 3 (three) times daily.    [name] triamcinolone (KENALOG) 0.025 % ointment Apply 1 application topically 2 (two) times daily. 30 g 0   No current facility-administered medications on file prior to visit.    BP 117/68 (BP Location: Right Arm, Patient Position: Sitting, Cuff Size: Small)   Pulse 73   Temp 98.5 F (36.9 C) (Oral)   Resp 16   Ht 5\' 4"  (1.626 m)   Wt 158 lb (71.7 kg)   LMP 12/25/2008   SpO2 99%   BMI 27.12 kg/m       Objective:   Physical Exam Constitutional:      Appearance: She is well-developed.  Cardiovascular:     Rate and Rhythm: Normal rate and regular rhythm.     Heart sounds: Normal heart sounds. No murmur heard.   Pulmonary:     Effort: Pulmonary effort is normal. No respiratory distress.     Breath sounds: Normal breath sounds. No wheezing.  Psychiatric:        Behavior: Behavior normal.        Thought Content: Thought content normal.        Judgment: Judgment normal.           Assessment & Plan:  HTN- she does not like taking potassium supplement.  BP looks good. Will d/c hctz and kdur (not taking).  Obtain follow up bmet to assess K+ and renal function. Continue exforge 5-320 and coreg 6.25 mg bid.  DM2- clinically stable.  She has been working  on weight loss and is doing well. Obtain follow up A1C.   Hyperlipidemia- on atorvastatin 20mg .  LDL at goal. Continue same.  Td vaccine today.   This visit occurred during the SARS-CoV-2 public health emergency.  Safety protocols were in place, including screening questions prior to the visit, additional usage of staff PPE, and extensive cleaning of exam room while observing appropriate contact time as indicated for disinfecting solutions.

## 2020-04-29 NOTE — Telephone Encounter (Signed)
I forgot to tell her that she should have a nurse visit in 1-2 weeks to recheck her BP off of hctz. Can you please call her to schedule? tks

## 2020-04-30 LAB — BASIC METABOLIC PANEL
BUN/Creatinine Ratio: 15 (calc) (ref 6–22)
BUN: 18 mg/dL (ref 7–25)
CO2: 26 mmol/L (ref 20–32)
Calcium: 9.8 mg/dL (ref 8.6–10.4)
Chloride: 103 mmol/L (ref 98–110)
Creat: 1.23 mg/dL — ABNORMAL HIGH (ref 0.50–0.99)
Glucose, Bld: 88 mg/dL (ref 65–99)
Potassium: 3.7 mmol/L (ref 3.5–5.3)
Sodium: 139 mmol/L (ref 135–146)

## 2020-04-30 LAB — HEMOGLOBIN A1C
Hgb A1c MFr Bld: 5.5 % of total Hgb (ref <5.7)
Mean Plasma Glucose: 111 mg/dL
eAG (mmol/L): 6.2 mmol/L

## 2020-05-02 NOTE — Telephone Encounter (Signed)
Patient advised and scheduled for Friday 05-13-20

## 2020-05-11 DIAGNOSIS — H04211 Epiphora due to excess lacrimation, right lacrimal gland: Secondary | ICD-10-CM | POA: Diagnosis not present

## 2020-05-11 DIAGNOSIS — H35013 Changes in retinal vascular appearance, bilateral: Secondary | ICD-10-CM | POA: Diagnosis not present

## 2020-05-11 DIAGNOSIS — E119 Type 2 diabetes mellitus without complications: Secondary | ICD-10-CM | POA: Diagnosis not present

## 2020-05-11 DIAGNOSIS — H524 Presbyopia: Secondary | ICD-10-CM | POA: Diagnosis not present

## 2020-05-11 LAB — HM DIABETES EYE EXAM

## 2020-05-13 ENCOUNTER — Ambulatory Visit: Payer: 59

## 2020-05-30 ENCOUNTER — Other Ambulatory Visit (HOSPITAL_BASED_OUTPATIENT_CLINIC_OR_DEPARTMENT_OTHER): Payer: Self-pay

## 2020-05-30 ENCOUNTER — Encounter: Payer: Self-pay | Admitting: Gastroenterology

## 2020-05-30 MED FILL — Trazodone HCl Tab 50 MG: ORAL | 90 days supply | Qty: 180 | Fill #0 | Status: AC

## 2020-06-14 ENCOUNTER — Other Ambulatory Visit (HOSPITAL_BASED_OUTPATIENT_CLINIC_OR_DEPARTMENT_OTHER): Payer: Self-pay

## 2020-06-14 DIAGNOSIS — F3181 Bipolar II disorder: Secondary | ICD-10-CM | POA: Diagnosis not present

## 2020-06-14 MED ORDER — ARIPIPRAZOLE 10 MG PO TABS
10.0000 mg | ORAL_TABLET | Freq: Every day | ORAL | 1 refills | Status: DC
  Filled 2020-06-14: qty 90, 90d supply, fill #0

## 2020-06-14 MED ORDER — LAMOTRIGINE 100 MG PO TABS
100.0000 mg | ORAL_TABLET | Freq: Two times a day (BID) | ORAL | 1 refills | Status: DC
  Filled 2020-06-14: qty 180, 90d supply, fill #0

## 2020-06-14 MED ORDER — TRAZODONE HCL 50 MG PO TABS
ORAL_TABLET | ORAL | 1 refills | Status: DC
  Filled 2020-06-14: qty 180, 90d supply, fill #0

## 2020-06-30 ENCOUNTER — Other Ambulatory Visit: Payer: Self-pay | Admitting: Family

## 2020-06-30 ENCOUNTER — Other Ambulatory Visit (HOSPITAL_BASED_OUTPATIENT_CLINIC_OR_DEPARTMENT_OTHER): Payer: Self-pay

## 2020-06-30 MED FILL — Amlodipine Besylate-Valsartan Tab 5-320 MG: ORAL | 90 days supply | Qty: 90 | Fill #0 | Status: AC

## 2020-06-30 MED FILL — Atorvastatin Calcium Tab 20 MG (Base Equivalent): ORAL | 90 days supply | Qty: 90 | Fill #0 | Status: AC

## 2020-07-01 ENCOUNTER — Other Ambulatory Visit (HOSPITAL_BASED_OUTPATIENT_CLINIC_OR_DEPARTMENT_OTHER): Payer: Self-pay

## 2020-07-04 ENCOUNTER — Other Ambulatory Visit (HOSPITAL_BASED_OUTPATIENT_CLINIC_OR_DEPARTMENT_OTHER): Payer: Self-pay

## 2020-07-05 ENCOUNTER — Encounter: Payer: Self-pay | Admitting: Family

## 2020-07-05 ENCOUNTER — Ambulatory Visit: Payer: 59 | Admitting: Family

## 2020-07-05 ENCOUNTER — Other Ambulatory Visit: Payer: Self-pay

## 2020-07-05 ENCOUNTER — Other Ambulatory Visit (HOSPITAL_BASED_OUTPATIENT_CLINIC_OR_DEPARTMENT_OTHER): Payer: Self-pay

## 2020-07-05 VITALS — BP 128/75 | HR 59 | Temp 98.8°F | Resp 16 | Ht 64.0 in | Wt 160.0 lb

## 2020-07-05 DIAGNOSIS — I10 Essential (primary) hypertension: Secondary | ICD-10-CM | POA: Diagnosis not present

## 2020-07-05 MED ORDER — HYDROCHLOROTHIAZIDE 25 MG PO TABS
25.0000 mg | ORAL_TABLET | Freq: Every day | ORAL | 0 refills | Status: DC
  Filled 2020-07-05: qty 90, 90d supply, fill #0

## 2020-07-05 NOTE — Progress Notes (Signed)
Subjective:    Patient ID: Kaitlyn Harper, female    DOB: March 14, 1960, 60 y.o.   MRN: [id number]  HPI   Patient is a 60 yr old female who presents today for follow up.  HTN- pt d/c'd hctz in March as we discussed due to hypokalemia. She then noticed her blood pressure starting to rise and restarted hctz in the end of April. She did not restart Kdur.   BP Readings from Last 3 Encounters:  07/05/20 128/75  04/29/20 117/68  01/29/20 138/74      Review of Systems See HPI  Past Medical History:  Diagnosis Date  . Anemia    resolved   . Anxiety   . Bipolar disorder ([place])   . Depression   . Diabetes mellitus without complication ([place])    type 2-diet controlled  . Diverticulitis   . Hyperglycemia   . Hyperlipidemia   . Hypertension   . NSTEMI (non-ST elevated myocardial infarction) ([place]) 2018   reports she fainted at his sons house and they took her to ED  , troponin was elevated and cardiac cath was completed,  no stent placed nor hx of stent intervention , she reports no casue was ever determined for the sycope episode   . Syncope 03/18/2016   reports no recurrence      Social History   Socioeconomic History  . Marital status: Single    Spouse name: Not on file  . Number of children: 3  . Years of education: Not on file  . Highest education level: Not on file  Occupational History  . Occupation: Water quality scientist: [employer]  Tobacco Use  . Smoking status: Current Some Day Smoker    Packs/day: 0.25    Types: Cigarettes    Last attempt to quit: 07/02/2015    Years since quitting: 5.0  . Smokeless tobacco: Never Used  . Tobacco comment: 10 cigarettes daily; 10-30 reports " one to two cigarettes every now and again"   Substance and Sexual Activity  . Alcohol use: Yes    Alcohol/week: 0.0 standard drinks    Comment: socially  . Drug use: No  . Sexual activity: Yes    Birth control/protection: Post-menopausal  Other Topics Concern  . Not on  file  Social History Narrative   Caffeine use:  2 drinks   Regular exercise: no   Lives with her daughter   3 children    2 grandchildren   1 great grandson   Social Determinants of Radio broadcast assistant Strain: Not on file  Food Insecurity: Not on file  Transportation Needs: Not on file  Physical Activity: Not on file  Stress: Not on file  Social Connections: Not on file  Intimate Partner Violence: Not on file    Past Surgical History:  Procedure Laterality Date  . APPENDECTOMY  1985  . CHOLECYSTECTOMY  1985  . COLONOSCOPY W/ POLYPECTOMY  03/01/2015   Dr Oretha Caprice, [facility].  +adenomatous polyps  . COLONOSCOPY W/ POLYPECTOMY  02/28/2011   4 polyps - 3 TA  . DILATION AND CURETTAGE OF UTERUS  2005  . [place]  2002  . KNEE SURGERY  1983   arthroscopy?  [name] LEFT HEART CATH AND CORONARY ANGIOGRAPHY N/A 03/19/2016   Procedure: Left Heart Cath and Coronary Angiography;  Surgeon: Peter M Martinique, MD;  Location: [facility];  Service: Cardiovascular;  Laterality: N/A;  . MOUTH SURGERY  03/08/2016  .  POLYPECTOMY  2006   ?anal polyp?  Kathee Polite DUCT PROBING  2004  . TUBAL LIGATION  1985    Family History  Problem Relation Age of Onset  . Hypertension Father   . Sarcoidosis Brother   . Alcohol abuse Brother   . Cirrhosis Brother   . Stroke Maternal Grandmother   . Colon cancer Neg Hx   . Colon polyps Neg Hx   . Esophageal cancer Neg Hx   . Rectal cancer Neg Hx   . Stomach cancer Neg Hx   . Pulmonary embolism Neg Hx     Allergies  Allergen Reactions  . Sulfonamide Derivatives Hives and Other (See Comments)    Light sensitivity    Current Outpatient Medications on File Prior to Visit  Medication Sig Dispense Refill  . acetaminophen (TYLENOL) 325 MG tablet Take 2 tablets (650 mg total) by mouth every 6 (six) hours as needed for mild pain (or Fever >/= 101).    [name] amLODipine-valsartan (EXFORGE) 5-320 MG tablet TAKE 1 TABLET BY MOUTH ONCE DAILY 90  tablet 1  . ARIPiprazole (ABILIFY) 10 MG tablet Take 10 mg by mouth daily after breakfast.   2  . ARIPiprazole (ABILIFY) 10 MG tablet TAKE 1 TABLET BY MOUTH ONCE A DAY 90 tablet 1  . ARIPiprazole (ABILIFY) 10 MG tablet Take 1 tablet by mouth once a day 90 tablet 1  . aspirin EC 81 MG tablet Take 81 mg by mouth daily.    [name] atorvastatin (LIPITOR) 20 MG tablet TAKE 1 TABLET BY MOUTH ONCE DAILY 90 tablet 1  . carvedilol (COREG) 6.25 MG tablet TAKE 1 TABLET BY MOUTH TWICE DAILY WITH A MEAL 180 tablet 1  . COVID-19 mRNA Vac-TriS, Pfizer, SUSP injection INJECT AS DIRECTED .3 mL 0  . ibuprofen (ADVIL) 600 MG tablet TAKE 1 TABLET BY MOUTH EVERY 6 HOURS. **MAX OF 5 TABLETS IN 24 HOURS** 12 tablet 0  . lamoTRIgine (LAMICTAL) 100 MG tablet Take 100 mg by mouth 2 (two) times daily.   0  . lamoTRIgine (LAMICTAL) 100 MG tablet TAKE 1 TABLET BY MOUTH TWICE A DAY 180 tablet 1  . lamoTRIgine (LAMICTAL) 100 MG tablet TAKE 1 TABLET BY MOUTH TWICE A DAY 180 tablet 1  . lamoTRIgine (LAMICTAL) 100 MG tablet Take 1 tablet by mouth twice a day 180 tablet 1  . psyllium (METAMUCIL) 58.6 % powder Take 1 packet by mouth 3 (three) times daily.    . SODIUM FLUORIDE, DENTAL RINSE, 0.2 % SOLN USE NIGHTLY AS DIRECTED PREFERABLY BEFORE BEDTIME 10 ML SWISH FOR 30 SECONDS AND EXPECTORATE DO NOT SWALLOW 473 mL 1  . traZODone (DESYREL) 50 MG tablet TAKE 1-2 TABLETS BY MOUTH AT BEDTIME AS NEEDED FOR SLEEP 180 tablet 1  . traZODone (DESYREL) 50 MG tablet TAKE 1-2 TABLETS BY MOUTH AT BEDTIME AS NEEDED FOR SLEEP (Patient taking differently: Take by mouth at bedtime as needed. for sleep) 180 tablet 1  . traZODone (DESYREL) 50 MG tablet Take 1-2 tablets by mouth at bedtime as needed for sleep. 180 tablet 1  . triamcinolone (KENALOG) 0.025 % ointment APPLY 1 APPLICATION TOPICALLY 2 (TWO) TIMES DAILY. 30 g 0  . ARIPiprazole (ABILIFY) 10 MG tablet TAKE 1 TABLET BY MOUTH ONCE A DAY 90 tablet 1  . lamoTRIgine (LAMICTAL) 100 MG tablet TAKE 1  TABLET BY MOUTH TWICE A DAY 180 tablet 1  . [DISCONTINUED] hydrochlorothiazide (HYDRODIURIL) 25 MG tablet Take 1 tablet (25 mg total) by mouth daily. 90 tablet  1   No current facility-administered medications on file prior to visit.    BP 128/75 (BP Location: Right Arm, Patient Position: Sitting, Cuff Size: Small)   Pulse (!) 59   Temp 98.8 F (37.1 C) (Oral)   Resp 16   Ht 5\' 4"  (1.626 m)   Wt 160 lb (72.6 kg)   LMP 12/25/2008   SpO2 100%   BMI 27.46 kg/m       Objective:   Physical Exam Constitutional:      Appearance: She is well-developed.  Cardiovascular:     Rate and Rhythm: Normal rate and regular rhythm.     Heart sounds: Normal heart sounds. No murmur heard.   Pulmonary:     Effort: Pulmonary effort is normal. No respiratory distress.     Breath sounds: Normal breath sounds. No wheezing.  Psychiatric:        Behavior: Behavior normal.        Thought Content: Thought content normal.        Judgment: Judgment normal.           Assessment & Plan:

## 2020-07-05 NOTE — Patient Instructions (Signed)
Please complete lab work prior to leaving.   

## 2020-07-06 ENCOUNTER — Other Ambulatory Visit (HOSPITAL_BASED_OUTPATIENT_CLINIC_OR_DEPARTMENT_OTHER): Payer: Self-pay

## 2020-07-06 ENCOUNTER — Telehealth: Payer: Self-pay | Admitting: Family

## 2020-07-06 DIAGNOSIS — N289 Disorder of kidney and ureter, unspecified: Secondary | ICD-10-CM

## 2020-07-06 LAB — BASIC METABOLIC PANEL
BUN: 24 mg/dL — ABNORMAL HIGH (ref 6–23)
CO2: 28 mEq/L (ref 19–32)
Calcium: 9.5 mg/dL (ref 8.4–10.5)
Chloride: 103 mEq/L (ref 96–112)
Creatinine, Ser: 1.53 mg/dL — ABNORMAL HIGH (ref 0.40–1.20)
GFR: 36.33 mL/min — ABNORMAL LOW (ref >60.00)
Glucose, Bld: 88 mg/dL (ref 70–99)
Potassium: 3.7 mEq/L (ref 3.5–5.1)
Sodium: 141 mEq/L (ref 135–145)

## 2020-07-06 NOTE — Assessment & Plan Note (Signed)
Controlled on HCTZ 25mg . Will check bmet. If K+ low, will need to restart Kdur.

## 2020-07-06 NOTE — Telephone Encounter (Signed)
Please advise pt that her potassium looks good but her kidney function has decreased some since last visit.  I would like for her to see the kidney doctor. Referral placed. If she is using any NSAIDS, please discontinue.

## 2020-07-07 NOTE — Telephone Encounter (Signed)
Patient advised of results, provider's advise and referral. She verbalized understanding.

## 2020-07-15 ENCOUNTER — Other Ambulatory Visit: Payer: Self-pay | Admitting: Family

## 2020-07-15 ENCOUNTER — Telehealth: Payer: Self-pay | Admitting: Family

## 2020-07-15 NOTE — Telephone Encounter (Signed)
See mychart.  

## 2020-08-02 ENCOUNTER — Ambulatory Visit (INDEPENDENT_AMBULATORY_CARE_PROVIDER_SITE_OTHER): Payer: 59 | Admitting: Family

## 2020-08-02 ENCOUNTER — Encounter: Payer: 59 | Admitting: Family

## 2020-08-02 ENCOUNTER — Encounter: Payer: Self-pay | Admitting: Family

## 2020-08-02 ENCOUNTER — Other Ambulatory Visit: Payer: Self-pay

## 2020-08-02 ENCOUNTER — Other Ambulatory Visit (HOSPITAL_BASED_OUTPATIENT_CLINIC_OR_DEPARTMENT_OTHER): Payer: Self-pay

## 2020-08-02 VITALS — BP 130/69 | HR 66 | Temp 98.7°F | Resp 16 | Ht 64.0 in | Wt 156.0 lb

## 2020-08-02 DIAGNOSIS — F319 Bipolar disorder, unspecified: Secondary | ICD-10-CM | POA: Diagnosis not present

## 2020-08-02 DIAGNOSIS — Z720 Tobacco use: Secondary | ICD-10-CM | POA: Diagnosis not present

## 2020-08-02 DIAGNOSIS — E1122 Type 2 diabetes mellitus with diabetic chronic kidney disease: Secondary | ICD-10-CM | POA: Diagnosis not present

## 2020-08-02 DIAGNOSIS — N183 Chronic kidney disease, stage 3 unspecified: Secondary | ICD-10-CM

## 2020-08-02 DIAGNOSIS — N1832 Chronic kidney disease, stage 3b: Secondary | ICD-10-CM | POA: Diagnosis not present

## 2020-08-02 DIAGNOSIS — Z0000 Encounter for general adult medical examination without abnormal findings: Secondary | ICD-10-CM

## 2020-08-02 DIAGNOSIS — I10 Essential (primary) hypertension: Secondary | ICD-10-CM

## 2020-08-02 DIAGNOSIS — E782 Mixed hyperlipidemia: Secondary | ICD-10-CM | POA: Diagnosis not present

## 2020-08-02 LAB — LIPID PANEL
Cholesterol: 110 mg/dL (ref 0–200)
HDL: 33.4 mg/dL — ABNORMAL LOW (ref >39.00)
LDL Cholesterol: 56 mg/dL (ref 0–99)
NonHDL: 76.4
Total CHOL/HDL Ratio: 3
Triglycerides: 102 mg/dL (ref 0.0–149.0)
VLDL: 20.4 mg/dL (ref 0.0–40.0)

## 2020-08-02 LAB — COMPREHENSIVE METABOLIC PANEL
ALT: 14 U/L (ref 0–35)
AST: 17 U/L (ref 0–37)
Albumin: 4.6 g/dL (ref 3.5–5.2)
Alkaline Phosphatase: 89 U/L (ref 39–117)
BUN: 13 mg/dL (ref 6–23)
CO2: 29 mEq/L (ref 19–32)
Calcium: 9.9 mg/dL (ref 8.4–10.5)
Chloride: 101 mEq/L (ref 96–112)
Creatinine, Ser: 1.3 mg/dL — ABNORMAL HIGH (ref 0.40–1.20)
GFR: 44.16 mL/min — ABNORMAL LOW (ref >60.00)
Glucose, Bld: 93 mg/dL (ref 70–99)
Potassium: 3.4 mEq/L — ABNORMAL LOW (ref 3.5–5.1)
Sodium: 139 mEq/L (ref 135–145)
Total Bilirubin: 0.6 mg/dL (ref 0.2–1.2)
Total Protein: 7.7 g/dL (ref 6.0–8.3)

## 2020-08-02 MED ORDER — NICOTINE 14 MG/24HR TD PT24
14.0000 mg | MEDICATED_PATCH | Freq: Every day | TRANSDERMAL | 1 refills | Status: DC
  Filled 2020-08-02: qty 28, 28d supply, fill #0

## 2020-08-02 NOTE — Patient Instructions (Signed)
Please get your second Covid Booster.

## 2020-08-02 NOTE — Assessment & Plan Note (Signed)
She is ready to quit. Smoking 1PPD, rx sent for nicoderm patch 14 mcg/24 hrs.

## 2020-08-02 NOTE — Assessment & Plan Note (Signed)
A1C has been improved since she has lost weight. Monitor.

## 2020-08-02 NOTE — Assessment & Plan Note (Signed)
Clinically stable- management per psychiatry.

## 2020-08-02 NOTE — Assessment & Plan Note (Signed)
BP is stable on exforge 5-320, coreg 6.25mg  bid, hctz 25mg  once daily.

## 2020-08-02 NOTE — Progress Notes (Signed)
Subjective:    Patient ID: Kaitlyn Harper, female    DOB: [birth year], 60 y.o.   MRN: [id number]  Chief Complaint  Patient presents with   Annual Exam         HPI Patient presents today for complete physical.  Lab Results  Component Value Date   HGBA1C 5.5 04/29/2020   HGBA1C 6.3 12/25/2019   HGBA1C 5.7 (H) 07/17/2019   Lab Results  Component Value Date   MICROALBUR 1.8 08/02/2015   LDLCALC 76 07/17/2019   CREATININE 1.53 (H) 07/05/2020    Immunizations: due for covid booster #3,  Diet: working on healthy diet Wt Readings from Last 3 Encounters:  08/02/20 156 lb (70.8 kg)  07/05/20 160 lb (72.6 kg)  04/29/20 158 lb (71.7 kg)    Exercise: walks regularly Colonoscopy: will be due in 2 more years.  Pap Smear: sees Henreitta Leber  Mammogram: scheduled Vision: up to date Dental: up to date.   Health Maintenance Due  Topic Date Due   Pneumococcal Vaccine 66-25 Years old (1 - PCV) Never done   Zoster Vaccines- Shingrix (1 of 2) Never done   MAMMOGRAM  12/04/2019   COVID-19 Vaccine (4 - Booster for [employer] series) 07/19/2020   PAP SMEAR-Modifier  06/13/2020    Past Medical History:  Diagnosis Date   Anemia    resolved    Anxiety    Bipolar disorder ([facility])    Depression    Diabetes mellitus without complication ([facility])    type 2-diet controlled   Diverticulitis    History of diverticulitis 12/17/2018   Hyperglycemia    Hyperlipidemia    Hypertension    NSTEMI (non-ST elevated myocardial infarction) ([facility]) 2018   reports she fainted at his sons house and they took her to ED  , troponin was elevated and cardiac cath was completed,  no stent placed nor hx of stent intervention , she reports no casue was ever determined for the sycope episode    Syncope 03/18/2016   reports no recurrence     Past Surgical History:  Procedure Laterality Date   APPENDECTOMY  1985   CHOLECYSTECTOMY  1985   COLONOSCOPY W/ POLYPECTOMY  03/01/2015   Dr Wendall Papa, [facility].   +adenomatous polyps   COLONOSCOPY W/ POLYPECTOMY  02/28/2011   4 polyps - 3 TA   DILATION AND CURETTAGE OF UTERUS  2005   HEMORRHOID SURGERY  2002   KNEE SURGERY  1983   arthroscopy?   LEFT HEART CATH AND CORONARY ANGIOGRAPHY N/A 03/19/2016   Procedure: Left Heart Cath and Coronary Angiography;  Surgeon: Peter M Swaziland, MD;  Location: [facility];  Service: Cardiovascular;  Laterality: N/A;   MOUTH SURGERY  03/08/2016   POLYPECTOMY  2006   ?anal polyp?   TEAR DUCT PROBING  2004   TUBAL LIGATION  1985    Family History  Problem Relation Age of Onset   Hypertension Father    Sarcoidosis Brother    Alcohol abuse Brother    Cirrhosis Brother    Stroke Maternal Grandmother    Colon cancer Neg Hx    Colon polyps Neg Hx    Esophageal cancer Neg Hx    Rectal cancer Neg Hx    Stomach cancer Neg Hx    Pulmonary embolism Neg Hx     Social History   Socioeconomic History   Marital status: Single    Spouse name: Not on file   Number of children: 3  Years of education: Not on file   Highest education level: Not on file  Occupational History   Occupation: RN SECRETARY    Employer:  [employer]  Tobacco Use   Smoking status: Every Day    Packs/day: 0.50    Pack years: 0.00    Types: Cigarettes    Last attempt to quit: 07/02/2015    Years since quitting: 5.0   Smokeless tobacco: Never   Tobacco comments:    10 cigarettes daily; 10-30 reports " one to two cigarettes every now and again"   Substance and Sexual Activity   Alcohol use: Yes    Alcohol/week: 0.0 standard drinks    Comment: socially   Drug use: No   Sexual activity: Yes    Partners: Male    Birth control/protection: Post-menopausal  Other Topics Concern   Not on file  Social History Narrative   Caffeine use:  2 drinks   Regular exercise: no   Lives with her daughter   3 children    2 grandchildren   1 great grandson   Works as a Biomedical engineer at [employer] Strain: Not on [employer] Insecurity: Not on file  Transportation Needs: Not on file  Physical Activity: Not on file  Stress: Not on file  Social Connections: Not on file  Intimate Partner Violence: Not on file    Outpatient Medications Prior to Visit  Medication Sig Dispense Refill   acetaminophen (TYLENOL) 325 MG tablet Take 2 tablets (650 mg total) by mouth every 6 (six) hours as needed for mild pain (or Fever >/= 101).     amLODipine-valsartan (EXFORGE) 5-320 MG tablet TAKE 1 TABLET BY MOUTH ONCE DAILY 90 tablet 1   ARIPiprazole (ABILIFY) 10 MG tablet TAKE 1 TABLET BY MOUTH ONCE A DAY 90 tablet 1   aspirin EC 81 MG tablet Take 81 mg by mouth daily.     atorvastatin (LIPITOR) 20 MG tablet TAKE 1 TABLET BY MOUTH ONCE DAILY 90 tablet 1   carvedilol (COREG) 6.25 MG tablet TAKE 1 TABLET BY MOUTH TWICE DAILY WITH A MEAL 180 tablet 1   COVID-19 mRNA Vac-TriS, Pfizer, SUSP injection INJECT AS DIRECTED .3 mL 0   hydrochlorothiazide (HYDRODIURIL) 25 MG tablet Take 1 tablet (25 mg total) by mouth daily. 90 tablet 0   psyllium (METAMUCIL) 58.6 % powder Take 1 packet by mouth 3 (three) times daily.     SODIUM FLUORIDE, DENTAL RINSE, 0.2 % SOLN USE NIGHTLY AS DIRECTED PREFERABLY BEFORE BEDTIME 10 ML SWISH FOR 30 SECONDS AND EXPECTORATE DO NOT SWALLOW 473 mL 1   traZODone (DESYREL) 50 MG tablet Take 1-2 tablets by mouth at bedtime as needed for sleep. 180 tablet 1   triamcinolone (KENALOG) 0.025 % ointment APPLY 1 APPLICATION TOPICALLY 2 (TWO) TIMES DAILY. 30 g 0   ARIPiprazole (ABILIFY) 10 MG tablet Take 10 mg by mouth daily after breakfast.   2   traZODone (DESYREL) 50 MG tablet TAKE 1-2 TABLETS BY MOUTH AT BEDTIME AS NEEDED FOR SLEEP (Patient taking differently: Take by mouth at bedtime as needed. for sleep) 180 tablet 1   No facility-administered medications prior to visit.    Allergies  Allergen Reactions   Sulfonamide Derivatives Hives and Other (See Comments)     Light sensitivity    Review of Systems  Constitutional:  Negative for fever and weight loss.  HENT:  Negative for congestion.   Eyes:  Negative for blurred vision.  Respiratory:  Negative for cough and shortness of breath.   Cardiovascular:  Negative for leg swelling.  Gastrointestinal:  Negative for blood in stool, constipation and diarrhea.  Genitourinary:  Negative for dysuria, frequency and hematuria.  Musculoskeletal:  Negative for joint pain and myalgias.  Skin:  Negative for rash.  Neurological:  Negative for headaches.  Psychiatric/Behavioral:         Reports depression symptoms are stable.       Objective:    Physical Exam  BP 130/69 (BP Location: Right Arm, Patient Position: Sitting, Cuff Size: Small)   Pulse 66   Temp 98.7 F (37.1 C) (Oral)   Resp 16   Ht $R'5\' 4"'Zz$  (1.626 m)   Wt 156 lb (70.8 kg)   LMP 12/25/2008   SpO2 100%   BMI 26.78 kg/m  Wt Readings from Last 3 Encounters:  08/02/20 156 lb (70.8 kg)  07/05/20 160 lb (72.6 kg)  04/29/20 158 lb (71.7 kg)   Physical Exam  Constitutional: She is oriented to person, place, and time. She appears well-developed and well-nourished. No distress.  HENT:  Head: Normocephalic and atraumatic.  Right Ear: Tympanic membrane and ear canal normal.  Left Ear: Tympanic membrane and ear canal normal.  Mouth/Throat: Not examined- pt wearing mask Eyes: Pupils are equal, round, and reactive to light. No scleral icterus.  Neck: Normal range of motion. No thyromegaly present.  Cardiovascular: Normal rate and regular rhythm.   No murmur heard. Pulmonary/Chest: Effort normal and breath sounds normal. No respiratory distress. He has no wheezes. She has no rales. She exhibits no tenderness.  Abdominal: Soft. Bowel sounds are normal. She exhibits no distension and no mass. There is no tenderness. There is no rebound and no guarding.  Musculoskeletal: She exhibits no edema.  Lymphadenopathy:    She has no cervical adenopathy.   Neurological: She is alert and oriented to person, place, and time. She has normal patellar reflexes. She exhibits normal muscle tone. Coordination normal.  Skin: Skin is warm and dry.  Psychiatric: She has a normal mood and affect. Her behavior is normal. Judgment and thought content normal.          Assessment & Plan:       Assessment & Plan:   Problem List Items Addressed This Visit       Unprioritized   Hyperlipidemia (Chronic)   Relevant Orders   Lipid panel   Comp Met (CMET)   Tobacco abuse    She is ready to quit. Smoking 1PPD, rx sent for nicoderm patch 14 mcg/24 hrs.        Preventative health care - Primary    Encouraged her to continue her healthy diet, exercise and weight loss. Recommended covid booster that she can get from the pharmacy downstairs.  She is a candidate for Shingrix and PCV 20- she declines both today.        Essential hypertension    BP is stable on exforge 5-320, coreg 6.$RemoveBefore'25mg'[id number]$  bid, hctz $RemoveB'25mg'HcxeVzkQ$  once daily.        Diabetes type 2, controlled ([place])    A1C has been improved since she has lost weight. Monitor.       Chronic renal failure, stage 3b (HCC)   Bipolar affective disorder, remission status unspecified ([place])    Clinically stable- management per psychiatry.         I have discontinued Justyn A. Dorantes "Gwen"'s triamcinolone. I am also having her start on nicotine. Additionally,  I am having her maintain her aspirin EC, acetaminophen, psyllium, carvedilol, COVID-19 mRNA Vac-TriS (Pfizer), ARIPiprazole, amLODipine-valsartan, atorvastatin, SODIUM FLUORIDE (DENTAL RINSE), traZODone, and hydrochlorothiazide.  Meds ordered this encounter  Medications   nicotine (NICODERM CQ) 14 mg/24hr patch    Sig: Place 1 patch (14 mg total) onto the skin daily.    Dispense:  28 patch    Refill:  1    Order Specific Question:   Supervising Provider    Answer:   Penni Homans A [[id number]]

## 2020-08-02 NOTE — Assessment & Plan Note (Signed)
Encouraged her to continue her healthy diet, exercise and weight loss. Recommended covid booster that she can get from the pharmacy downstairs.  She is a candidate for Shingrix and PCV 20- she declines both today.

## 2020-08-05 ENCOUNTER — Other Ambulatory Visit: Payer: Self-pay

## 2020-08-05 ENCOUNTER — Ambulatory Visit
Admission: RE | Admit: 2020-08-05 | Discharge: 2020-08-05 | Disposition: A | Discharge status: Home / Self Care | Payer: 59 | Source: Ambulatory Visit | Attending: Family | Admitting: Family

## 2020-08-05 DIAGNOSIS — Z1231 Encounter for screening mammogram for malignant neoplasm of breast: Secondary | ICD-10-CM | POA: Diagnosis not present

## 2020-08-05 DIAGNOSIS — Z0000 Encounter for general adult medical examination without abnormal findings: Secondary | ICD-10-CM

## 2020-08-11 ENCOUNTER — Other Ambulatory Visit (HOSPITAL_BASED_OUTPATIENT_CLINIC_OR_DEPARTMENT_OTHER): Payer: Self-pay

## 2020-08-11 MED FILL — Aripiprazole Tab 10 MG: ORAL | 90 days supply | Qty: 90 | Fill #0 | Status: AC

## 2020-08-12 ENCOUNTER — Other Ambulatory Visit (HOSPITAL_BASED_OUTPATIENT_CLINIC_OR_DEPARTMENT_OTHER): Payer: Self-pay

## 2020-08-12 ENCOUNTER — Other Ambulatory Visit: Payer: Self-pay

## 2020-08-12 MED FILL — Carvedilol Tab 6.25 MG: ORAL | 90 days supply | Qty: 180 | Fill #0 | Status: AC

## 2020-08-16 ENCOUNTER — Other Ambulatory Visit (HOSPITAL_BASED_OUTPATIENT_CLINIC_OR_DEPARTMENT_OTHER): Payer: Self-pay

## 2020-08-17 ENCOUNTER — Other Ambulatory Visit (HOSPITAL_BASED_OUTPATIENT_CLINIC_OR_DEPARTMENT_OTHER): Payer: Self-pay

## 2020-08-22 ENCOUNTER — Other Ambulatory Visit: Payer: Self-pay

## 2020-08-22 ENCOUNTER — Other Ambulatory Visit (HOSPITAL_BASED_OUTPATIENT_CLINIC_OR_DEPARTMENT_OTHER): Payer: Self-pay

## 2020-08-23 ENCOUNTER — Other Ambulatory Visit (HOSPITAL_BASED_OUTPATIENT_CLINIC_OR_DEPARTMENT_OTHER): Payer: Self-pay

## 2020-08-24 ENCOUNTER — Other Ambulatory Visit (HOSPITAL_BASED_OUTPATIENT_CLINIC_OR_DEPARTMENT_OTHER): Payer: Self-pay

## 2020-08-25 ENCOUNTER — Other Ambulatory Visit (HOSPITAL_BASED_OUTPATIENT_CLINIC_OR_DEPARTMENT_OTHER): Payer: Self-pay

## 2020-08-29 ENCOUNTER — Encounter: Payer: Self-pay | Admitting: Family

## 2020-08-29 ENCOUNTER — Other Ambulatory Visit (HOSPITAL_BASED_OUTPATIENT_CLINIC_OR_DEPARTMENT_OTHER): Payer: Self-pay

## 2020-08-29 MED ORDER — LAMOTRIGINE 100 MG PO TABS
ORAL_TABLET | Freq: Two times a day (BID) | ORAL | 1 refills | Status: DC
  Filled 2020-08-29: qty 180, 90d supply, fill #0

## 2020-08-30 ENCOUNTER — Other Ambulatory Visit (HOSPITAL_BASED_OUTPATIENT_CLINIC_OR_DEPARTMENT_OTHER): Payer: Self-pay

## 2020-08-31 ENCOUNTER — Other Ambulatory Visit: Payer: Self-pay

## 2020-08-31 ENCOUNTER — Other Ambulatory Visit (HOSPITAL_BASED_OUTPATIENT_CLINIC_OR_DEPARTMENT_OTHER): Payer: Self-pay

## 2020-08-31 DIAGNOSIS — E785 Hyperlipidemia, unspecified: Secondary | ICD-10-CM | POA: Diagnosis not present

## 2020-08-31 DIAGNOSIS — N1832 Chronic kidney disease, stage 3b: Secondary | ICD-10-CM | POA: Diagnosis not present

## 2020-08-31 DIAGNOSIS — N2581 Secondary hyperparathyroidism of renal origin: Secondary | ICD-10-CM | POA: Diagnosis not present

## 2020-08-31 DIAGNOSIS — N189 Chronic kidney disease, unspecified: Secondary | ICD-10-CM | POA: Diagnosis not present

## 2020-08-31 DIAGNOSIS — D631 Anemia in chronic kidney disease: Secondary | ICD-10-CM | POA: Diagnosis not present

## 2020-08-31 DIAGNOSIS — I129 Hypertensive chronic kidney disease with stage 1 through stage 4 chronic kidney disease, or unspecified chronic kidney disease: Secondary | ICD-10-CM | POA: Diagnosis not present

## 2020-09-02 ENCOUNTER — Other Ambulatory Visit (HOSPITAL_BASED_OUTPATIENT_CLINIC_OR_DEPARTMENT_OTHER): Payer: Self-pay

## 2020-09-05 ENCOUNTER — Other Ambulatory Visit (HOSPITAL_BASED_OUTPATIENT_CLINIC_OR_DEPARTMENT_OTHER): Payer: Self-pay

## 2020-09-05 MED ORDER — TRAZODONE HCL 50 MG PO TABS
ORAL_TABLET | Freq: Every evening | ORAL | 0 refills | Status: DC | PRN
  Filled 2020-09-05: qty 180, 90d supply, fill #0

## 2020-09-06 ENCOUNTER — Other Ambulatory Visit: Payer: Self-pay | Admitting: Nephrology

## 2020-09-06 ENCOUNTER — Encounter: Payer: Self-pay | Admitting: Family

## 2020-09-06 ENCOUNTER — Other Ambulatory Visit (HOSPITAL_BASED_OUTPATIENT_CLINIC_OR_DEPARTMENT_OTHER): Payer: Self-pay

## 2020-09-06 DIAGNOSIS — N1832 Chronic kidney disease, stage 3b: Secondary | ICD-10-CM

## 2020-09-07 ENCOUNTER — Other Ambulatory Visit: Payer: Self-pay

## 2020-09-07 DIAGNOSIS — F17200 Nicotine dependence, unspecified, uncomplicated: Secondary | ICD-10-CM

## 2020-09-07 MED ORDER — NICOTINE 7 MG/24HR TD PT24: 21.0000 mg | MEDICATED_PATCH | Freq: Every day | TRANSDERMAL | Status: AC

## 2020-09-07 NOTE — Progress Notes (Signed)
ni

## 2020-09-08 ENCOUNTER — Other Ambulatory Visit (HOSPITAL_BASED_OUTPATIENT_CLINIC_OR_DEPARTMENT_OTHER): Payer: Self-pay

## 2020-09-08 ENCOUNTER — Other Ambulatory Visit: Payer: Self-pay | Admitting: Family

## 2020-09-08 DIAGNOSIS — Z720 Tobacco use: Secondary | ICD-10-CM

## 2020-09-08 MED ORDER — NICOTINE 21 MG/24HR TD PT24
21.0000 mg | MEDICATED_PATCH | Freq: Every day | TRANSDERMAL | 0 refills | Status: DC
  Filled 2020-09-08: qty 28, 28d supply, fill #0

## 2020-09-08 NOTE — Telephone Encounter (Signed)
Rx sent per provider note.

## 2020-09-09 ENCOUNTER — Other Ambulatory Visit: Payer: Self-pay

## 2020-09-09 ENCOUNTER — Other Ambulatory Visit (HOSPITAL_BASED_OUTPATIENT_CLINIC_OR_DEPARTMENT_OTHER): Payer: Self-pay

## 2020-09-09 ENCOUNTER — Ambulatory Visit
Admission: RE | Admit: 2020-09-09 | Discharge: 2020-09-09 | Disposition: A | Discharge status: Home / Self Care | Payer: 59 | Source: Ambulatory Visit | Attending: Nephrology | Admitting: Nephrology

## 2020-09-09 DIAGNOSIS — N281 Cyst of kidney, acquired: Secondary | ICD-10-CM | POA: Diagnosis not present

## 2020-09-09 DIAGNOSIS — N1832 Chronic kidney disease, stage 3b: Secondary | ICD-10-CM | POA: Diagnosis not present

## 2020-09-13 ENCOUNTER — Other Ambulatory Visit (HOSPITAL_BASED_OUTPATIENT_CLINIC_OR_DEPARTMENT_OTHER): Payer: Self-pay

## 2020-09-13 DIAGNOSIS — F3181 Bipolar II disorder: Secondary | ICD-10-CM | POA: Diagnosis not present

## 2020-09-13 MED ORDER — LAMOTRIGINE 100 MG PO TABS
100.0000 mg | ORAL_TABLET | Freq: Two times a day (BID) | ORAL | 1 refills | Status: DC
  Filled 2020-09-13 – 2020-11-24 (×2): qty 180, 90d supply, fill #0
  Filled 2021-03-06: qty 180, 90d supply, fill #1

## 2020-09-13 MED ORDER — ARIPIPRAZOLE 10 MG PO TABS
10.0000 mg | ORAL_TABLET | Freq: Every day | ORAL | 1 refills | Status: DC
  Filled 2020-09-13 – 2020-11-15 (×2): qty 90, 90d supply, fill #0
  Filled 2021-02-13: qty 90, 90d supply, fill #1

## 2020-09-13 MED ORDER — TRAZODONE HCL 50 MG PO TABS
50.0000 mg | ORAL_TABLET | Freq: Every evening | ORAL | 1 refills | Status: DC | PRN
  Filled 2020-09-13 – 2020-12-02 (×2): qty 180, 90d supply, fill #0
  Filled 2021-03-06: qty 180, 90d supply, fill #1

## 2020-09-21 DIAGNOSIS — Z01419 Encounter for gynecological examination (general) (routine) without abnormal findings: Secondary | ICD-10-CM | POA: Diagnosis not present

## 2020-09-21 DIAGNOSIS — N189 Chronic kidney disease, unspecified: Secondary | ICD-10-CM | POA: Insufficient documentation

## 2020-09-21 DIAGNOSIS — Z6828 Body mass index (BMI) 28.0-28.9, adult: Secondary | ICD-10-CM | POA: Diagnosis not present

## 2020-09-21 DIAGNOSIS — Z113 Encounter for screening for infections with a predominantly sexual mode of transmission: Secondary | ICD-10-CM | POA: Diagnosis not present

## 2020-09-21 DIAGNOSIS — N1831 Chronic kidney disease, stage 3a: Secondary | ICD-10-CM | POA: Insufficient documentation

## 2020-10-07 ENCOUNTER — Other Ambulatory Visit: Payer: Self-pay | Admitting: Family

## 2020-10-07 ENCOUNTER — Other Ambulatory Visit (HOSPITAL_BASED_OUTPATIENT_CLINIC_OR_DEPARTMENT_OTHER): Payer: Self-pay

## 2020-10-07 MED ORDER — ATORVASTATIN CALCIUM 20 MG PO TABS
ORAL_TABLET | Freq: Every day | ORAL | 1 refills | Status: DC
  Filled 2020-10-07: qty 90, 90d supply, fill #0
  Filled 2021-01-01: qty 90, 90d supply, fill #1

## 2020-10-07 MED ORDER — HYDROCHLOROTHIAZIDE 25 MG PO TABS
25.0000 mg | ORAL_TABLET | Freq: Every day | ORAL | 0 refills | Status: DC
  Filled 2020-10-07: qty 90, 90d supply, fill #0

## 2020-10-10 ENCOUNTER — Ambulatory Visit: Payer: 59 | Attending: Internal Medicine

## 2020-10-10 DIAGNOSIS — Z23 Encounter for immunization: Secondary | ICD-10-CM

## 2020-10-10 NOTE — Progress Notes (Signed)
   Covid-19 Vaccination Clinic  Name:  Kaitlyn Harper    MRN: [id number] DOB: 1960-03-16  10/10/2020  [name] was observed post Covid-19 immunization for 15 minutes without incident. She was provided with Vaccine Information Sheet and instruction to access the V-Safe system.   [name] was instructed to call 911 with any severe reactions post vaccine: Difficulty breathing  Swelling of face and throat  A fast heartbeat  A bad rash all over body  Dizziness and weakness   Immunizations Administered     Name Date Dose VIS Date Route   PFIZER Comrnaty(Gray TOP) Covid-19 Vaccine 10/10/2020 12:15 PM 0.3 mL 01/21/2020 Intramuscular   Manufacturer: [place]   Lot: [id number]   [place]: [phone]

## 2020-10-13 ENCOUNTER — Other Ambulatory Visit: Payer: Self-pay | Admitting: Family

## 2020-10-14 ENCOUNTER — Other Ambulatory Visit (HOSPITAL_BASED_OUTPATIENT_CLINIC_OR_DEPARTMENT_OTHER): Payer: Self-pay

## 2020-10-14 MED ORDER — AMLODIPINE BESYLATE-VALSARTAN 5-320 MG PO TABS
1.0000 | ORAL_TABLET | Freq: Every day | ORAL | 1 refills | Status: DC
  Filled 2020-10-14: qty 90, 90d supply, fill #0
  Filled 2021-01-23: qty 90, 90d supply, fill #1

## 2020-10-24 ENCOUNTER — Other Ambulatory Visit (HOSPITAL_BASED_OUTPATIENT_CLINIC_OR_DEPARTMENT_OTHER): Payer: Self-pay

## 2020-10-24 MED ORDER — COVID-19 MRNA VAC-TRIS(PFIZER) 30 MCG/0.3ML IM SUSP
INTRAMUSCULAR | 0 refills | Status: DC
  Filled 2020-10-24: qty 0.3, 1d supply, fill #0

## 2020-10-25 ENCOUNTER — Other Ambulatory Visit (HOSPITAL_BASED_OUTPATIENT_CLINIC_OR_DEPARTMENT_OTHER): Payer: Self-pay

## 2020-11-02 ENCOUNTER — Ambulatory Visit: Payer: 59 | Admitting: Family

## 2020-11-07 ENCOUNTER — Telehealth: Payer: Self-pay | Admitting: Family

## 2020-11-07 ENCOUNTER — Other Ambulatory Visit: Payer: Self-pay

## 2020-11-07 ENCOUNTER — Other Ambulatory Visit (HOSPITAL_BASED_OUTPATIENT_CLINIC_OR_DEPARTMENT_OTHER): Payer: Self-pay

## 2020-11-07 ENCOUNTER — Ambulatory Visit: Payer: 59 | Admitting: Family

## 2020-11-07 VITALS — BP 118/73 | HR 70 | Temp 97.9°F | Resp 16 | Wt 155.0 lb

## 2020-11-07 DIAGNOSIS — E1122 Type 2 diabetes mellitus with diabetic chronic kidney disease: Secondary | ICD-10-CM

## 2020-11-07 DIAGNOSIS — I10 Essential (primary) hypertension: Secondary | ICD-10-CM | POA: Diagnosis not present

## 2020-11-07 DIAGNOSIS — N183 Chronic kidney disease, stage 3 unspecified: Secondary | ICD-10-CM | POA: Diagnosis not present

## 2020-11-07 DIAGNOSIS — F329 Major depressive disorder, single episode, unspecified: Secondary | ICD-10-CM | POA: Diagnosis not present

## 2020-11-07 DIAGNOSIS — G4700 Insomnia, unspecified: Secondary | ICD-10-CM

## 2020-11-07 DIAGNOSIS — F32A Depression, unspecified: Secondary | ICD-10-CM

## 2020-11-07 DIAGNOSIS — E559 Vitamin D deficiency, unspecified: Secondary | ICD-10-CM

## 2020-11-07 LAB — BASIC METABOLIC PANEL
BUN: 22 mg/dL (ref 6–23)
CO2: 27 mEq/L (ref 19–32)
Calcium: 9.7 mg/dL (ref 8.4–10.5)
Chloride: 105 mEq/L (ref 96–112)
Creatinine, Ser: 1.22 mg/dL — ABNORMAL HIGH (ref 0.40–1.20)
GFR: 47.56 mL/min — ABNORMAL LOW (ref >60.00)
Glucose, Bld: 105 mg/dL — ABNORMAL HIGH (ref 70–99)
Potassium: 3.4 mEq/L — ABNORMAL LOW (ref 3.5–5.1)
Sodium: 139 mEq/L (ref 135–145)

## 2020-11-07 LAB — HEMOGLOBIN A1C: Hgb A1c MFr Bld: 5.9 % (ref 4.6–6.5)

## 2020-11-07 LAB — VITAMIN D 25 HYDROXY (VIT D DEFICIENCY, FRACTURES): VITD: 28.38 ng/mL — ABNORMAL LOW (ref 30.00–100.00)

## 2020-11-07 NOTE — Patient Instructions (Signed)
Please complete lab work prior to [employer].

## 2020-11-07 NOTE — Assessment & Plan Note (Signed)
Lab Results  Component Value Date   HGBA1C 5.5 04/29/2020   HGBA1C 6.3 12/25/2019   HGBA1C 5.7 (H) 07/17/2019   Lab Results  Component Value Date   MICROALBUR 1.8 08/02/2015   LDLCALC 56 08/02/2020   CREATININE 1.30 (H) 08/02/2020   Wt Readings from Last 3 Encounters:  11/07/20 155 lb (70.3 kg)  08/02/20 156 lb (70.8 kg)  07/05/20 160 lb (72.6 kg)

## 2020-11-07 NOTE — Assessment & Plan Note (Signed)
BP Readings from Last 3 Encounters:  11/07/20 118/73  08/02/20 130/69  07/05/20 128/75   Stable on exforge 5-320mg  and hctz 25mg  once daily.

## 2020-11-07 NOTE — Assessment & Plan Note (Signed)
Lab Results  Component Value Date   CHOL 110 08/02/2020   HDL 33.40 (L) 08/02/2020   LDLCALC 56 08/02/2020   TRIG 102.0 08/02/2020   CHOLHDL 3 08/02/2020   At goal on lipitor 20mg . Continue same.

## 2020-11-07 NOTE — Progress Notes (Signed)
Subjective:   By signing my name below, I, Shehryar Baig, attest that this documentation has been prepared under the direction and in the presence of Debbrah Alar NP. 11/07/2020      Patient ID: Kaitlyn Harper, female    DOB: 13-May-1960, 60 y.o.   MRN: [id number]  Chief Complaint  Patient presents with   Hypertension    Here for follow up    Hypertension  Patient is in today for a office visit.  Probiotic- She is requesting to take probiotic to manage her stomach issues.  Nicotine patch- She was given the wrong dosage for her last nicotine patch and has stopped taking it. She has not smoked cigarettes while taking the patch. She is chewing nicotine free gum to help her quit smoking.  Mood- She continues taking 50 mg trazodone daily PO and reports doing well while taking it. She continues seeing a psychiatrist and notes her mood is doing well. Blood pressure- Her blood pressure is doing well during this visit. She continues taking 5-320 mg exforge daily PO, 25 mg hydrochlorothiazide daily PO and reports on new issues while taking it.   BP Readings from Last 3 Encounters:  11/07/20 118/73  08/02/20 130/69  07/05/20 128/75   Pulse Readings from Last 3 Encounters:  11/07/20 70  08/02/20 66  07/05/20 (!) 59   Blood sugar- Her a1c levels were well during her last blood work.   Lab Results  Component Value Date   HGBA1C 5.5 04/29/2020   Cholesterol- she continues taking 20 mg atorvastatin daily PO and reports no new issues while taking it. She also continues taking 81 mg aspirin daily PO and reports no new issues while taking it.   Lab Results  Component Value Date   CHOL 110 08/02/2020   HDL 33.40 (L) 08/02/2020   LDLCALC 56 08/02/2020   TRIG 102.0 08/02/2020   CHOLHDL 3 08/02/2020   Weight- Her weight is stable at this time.   Wt Readings from Last 3 Encounters:  11/07/20 155 lb (70.3 kg)  08/02/20 156 lb (70.8 kg)  07/05/20 160 lb (72.6 kg)   Cardiology-  She does not see a cardiologist at this time.  GYN- She reports recently getting a pap smear complete at another providers office.  Vitamin D- She does not regularly take vitamin D supplements.  Irritable bowel- She has no recent episodes of irritable bowel syndrome flaring up.  Left shoulder pain- She has no recent flare ups of left shoulder pain.  Hot flashes- She had no recent episodes of hot flashes.  LOC- She had no episodes of fainting since her last episodes. Heartburn- She has no recent episodes of heartburn.  Immunizations- She has received the flu vaccine. She is eligible for the shingles vaccine and is not interested in getting it at this time.    Health Maintenance Due  Topic Date Due   PAP SMEAR-Modifier  06/13/2020   HEMOGLOBIN A1C  10/30/2020    Past Medical History:  Diagnosis Date   Anemia    resolved    Anxiety    Bipolar disorder ([place])    Depression    Diabetes mellitus without complication ([place])    type 2-diet controlled   Diverticulitis    History of diverticulitis 12/17/2018   Hyperglycemia    Hyperlipidemia    Hypertension    NSTEMI (non-ST elevated myocardial infarction) [facility]) 2018   reports she fainted at his sons house and they took her to ED  ,  troponin was elevated and cardiac cath was completed,  no stent placed nor hx of stent intervention , she reports no casue was ever determined for the sycope episode    Syncope 03/18/2016   reports no recurrence     Past Surgical History:  Procedure Laterality Date   APPENDECTOMY  1985   BREAST BIOPSY Right 01/13/2014   benign   CHOLECYSTECTOMY  1985   COLONOSCOPY W/ POLYPECTOMY  03/01/2015   Dr Oretha Caprice, [facility].  +adenomatous polyps   COLONOSCOPY W/ POLYPECTOMY  02/28/2011   4 polyps - 3 TA   DILATION AND CURETTAGE OF UTERUS  2005   HEMORRHOID SURGERY  2002   KNEE SURGERY  1983   arthroscopy?   LEFT HEART CATH AND CORONARY ANGIOGRAPHY N/A 03/19/2016   Procedure: Left Heart Cath and Coronary  Angiography;  Surgeon: Peter M Martinique, MD;  Location: [facility];  Service: Cardiovascular;  Laterality: N/A;   MOUTH SURGERY  03/08/2016   POLYPECTOMY  2006   ?anal polyp?   TEAR DUCT PROBING  2004   TUBAL LIGATION  1985    Family History  Problem Relation Age of Onset   Hypertension Father    Sarcoidosis Brother    Alcohol abuse Brother    Cirrhosis Brother    Stroke Maternal Grandmother    Colon cancer Neg Hx    Colon polyps Neg Hx    Esophageal cancer Neg Hx    Rectal cancer Neg Hx    Stomach cancer Neg Hx    Pulmonary embolism Neg Hx     Social History   Socioeconomic History   Marital status: Single    Spouse name: Not on file   Number of children: 3   Years of education: Not on file   Highest education level: Not on file  Occupational History   Occupation: Teacher, music    Employer: [employer]  Tobacco Use   Smoking status: Every Day    Packs/day: 0.50    Types: Cigarettes    Last attempt to quit: 07/02/2015    Years since quitting: 5.3   Smokeless tobacco: Never   Tobacco comments:    10 cigarettes daily; 10-30 reports " one to two cigarettes every now and again"   Substance and Sexual Activity   Alcohol use: Yes    Alcohol/week: 0.0 standard drinks    Comment: socially   Drug use: No   Sexual activity: Yes    Partners: Male    Birth control/protection: Post-menopausal  Other Topics Concern   Not on file  Social History Narrative   Caffeine use:  2 drinks   Regular exercise: no   Lives with her daughter   3 children    2 grandchildren   1 great grandson   Works as a Biomedical engineer at [employer] Strain: Not on [employer] Insecurity: Not on file  Transportation Needs: Not on file  Physical Activity: Not on file  Stress: Not on file  Social Connections: Not on file  Intimate Partner Violence: Not on file    Outpatient Medications Prior to Visit  Medication Sig Dispense Refill    acetaminophen (TYLENOL) 325 MG tablet Take 2 tablets (650 mg total) by mouth every 6 (six) hours as needed for mild pain (or Fever >/= 101).     amLODipine-valsartan (EXFORGE) 5-320 MG tablet TAKE 1 TABLET BY MOUTH ONCE DAILY 90 tablet 1  ARIPiprazole (ABILIFY) 10 MG tablet Take 1 tablet by mouth once a day 90 tablet 1   aspirin EC 81 MG tablet Take 81 mg by mouth daily.     atorvastatin (LIPITOR) 20 MG tablet TAKE 1 TABLET BY MOUTH ONCE DAILY 90 tablet 1   carvedilol (COREG) 6.25 MG tablet TAKE 1 TABLET BY MOUTH TWICE DAILY WITH A MEAL 180 tablet 1   COVID-19 mRNA Vac-TriS, Pfizer, SUSP injection INJECT AS DIRECTED .3 mL 0   hydrochlorothiazide (HYDRODIURIL) 25 MG tablet Take 1 tablet (25 mg total) by mouth daily. 90 tablet 0   lamoTRIgine (LAMICTAL) 100 MG tablet Take 1 tablet by mouth 2 (two) times daily. 180 tablet 1   lamoTRIgine (LAMICTAL) 100 MG tablet TAKE 1 TABLET BY MOUTH TWICE A DAY 180 tablet 1   psyllium (METAMUCIL) 58.6 % powder Take 1 packet by mouth 3 (three) times daily.     SODIUM FLUORIDE, DENTAL RINSE, 0.2 % SOLN USE NIGHTLY AS DIRECTED PREFERABLY BEFORE BEDTIME 10 ML SWISH FOR 30 SECONDS AND EXPECTORATE DO NOT SWALLOW 473 mL 1   traZODone (DESYREL) 50 MG tablet TAKE 1-2 TABLETS BY MOUTH AT BEDTIME AS NEEDED FOR SLEEP 180 tablet 1   COVID-19 mRNA Vac-TriS, Pfizer, SUSP injection Inject into the muscle. 0.3 mL 0   nicotine (NICODERM CQ - DOSED IN MG/24 HOURS) 21 mg/24hr patch Place 1 patch (21 mg total) onto the skin daily. 28 patch 0   nicotine (NICODERM CQ) 14 mg/24hr patch Place 1 patch (14 mg total) onto the skin daily. 28 patch 1   traZODone (DESYREL) 50 MG tablet Take 1-2 tablets by mouth at bedtime as needed for sleep. 180 tablet 1   traZODone (DESYREL) 50 MG tablet TAKE 1-2 TABLETS BY MOUTH AT BEDTIME AS NEEDED FOR SLEEP 180 tablet 0   No facility-administered medications prior to visit.    Allergies  Allergen Reactions   Sulfonamide Derivatives Hives and Other  (See Comments)    Light sensitivity    Review of Systems  Constitutional:        (-)hot flashes  Gastrointestinal:  Negative for heartburn.  Musculoskeletal:  Negative for joint pain (left shoulder).  Neurological:  Negative for loss of consciousness (no recent episodes).      Objective:    Physical Exam Constitutional:      General: She is not in acute distress.    Appearance: Normal appearance. She is not ill-appearing.  HENT:     Head: Normocephalic and atraumatic.     Right Ear: External ear normal.     Left Ear: External ear normal.  Eyes:     Extraocular Movements: Extraocular movements intact.     Pupils: Pupils are equal, round, and reactive to light.  Cardiovascular:     Rate and Rhythm: Normal rate and regular rhythm.     Heart sounds: Normal heart sounds. No murmur heard.   No gallop.  Pulmonary:     Effort: Pulmonary effort is normal. No respiratory distress.     Breath sounds: Normal breath sounds. No wheezing or rales.  Skin:    General: Skin is warm and dry.  Neurological:     Mental Status: She is alert and oriented to person, place, and time.  Psychiatric:        Behavior: Behavior normal.    BP 118/73 (BP Location: Right Arm, Patient Position: Sitting, Cuff Size: Small)   Pulse 70   Temp 97.9 F (36.6 C) (Oral)   Resp 16  Wt 155 lb (70.3 kg)   LMP 12/25/2008   SpO2 99%   BMI 26.61 kg/m  Wt Readings from Last 3 Encounters:  11/07/20 155 lb (70.3 kg)  08/02/20 156 lb (70.8 kg)  07/05/20 160 lb (72.6 kg)       Assessment & Plan:   Problem List Items Addressed This Visit       Unprioritized   Insomnia    Reports stable with trazodone 50mg  Continue same.       Essential hypertension    BP Readings from Last 3 Encounters:  11/07/20 118/73  08/02/20 130/69  07/05/20 128/75  Stable on exforge 5-320mg  and hctz 25mg  once daily.       Diabetes type 2, controlled ([place])    Lab Results  Component Value Date   HGBA1C 5.5 04/29/2020    HGBA1C 6.3 12/25/2019   HGBA1C 5.7 (H) 07/17/2019   Lab Results  Component Value Date   MICROALBUR 1.8 08/02/2015   LDLCALC 56 08/02/2020   CREATININE 1.30 (H) 08/02/2020   Wt Readings from Last 3 Encounters:  11/07/20 155 lb (70.3 kg)  08/02/20 156 lb (70.8 kg)  07/05/20 160 lb (72.6 kg)        Relevant Orders   Hemoglobin T1Y   Basic metabolic panel   Depressive disorder    Reports stable. Management per psych.      Other Visit Diagnoses     Vitamin D deficiency    -  Primary   Relevant Orders   VITAMIN D 25 Hydroxy (Vit-D Deficiency, Fractures)        No orders of the defined types were placed in this encounter.   I, Debbrah Alar NP, personally preformed the services described in this documentation.  All medical record entries made by the scribe were at my direction and in my presence.  I have reviewed the chart and discharge instructions (if applicable) and agree that the record reflects my personal performance and is accurate and complete. 11/07/2020   I,Shehryar Baig,acting as a scribe for Nance Pear, NP.,have documented all relevant documentation on the behalf of Nance Pear, NP,as directed by  Nance Pear, NP while in the presence of Nance Pear, NP.   Nance Pear, NP

## 2020-11-07 NOTE — Assessment & Plan Note (Signed)
Reports stable. Management per psych.

## 2020-11-07 NOTE — Telephone Encounter (Signed)
Please call [facility] and request copy of her last pap Marlaine Hind Florene Glen is provider).

## 2020-11-07 NOTE — Telephone Encounter (Signed)
Request faxed to [facility]

## 2020-11-07 NOTE — Assessment & Plan Note (Signed)
Reports stable with trazodone 50mg  Continue same.

## 2020-11-08 ENCOUNTER — Other Ambulatory Visit (HOSPITAL_BASED_OUTPATIENT_CLINIC_OR_DEPARTMENT_OTHER): Payer: Self-pay

## 2020-11-08 ENCOUNTER — Other Ambulatory Visit: Payer: Self-pay

## 2020-11-08 ENCOUNTER — Telehealth: Payer: Self-pay | Admitting: Family

## 2020-11-08 DIAGNOSIS — E876 Hypokalemia: Secondary | ICD-10-CM

## 2020-11-08 DIAGNOSIS — E559 Vitamin D deficiency, unspecified: Secondary | ICD-10-CM

## 2020-11-08 MED ORDER — VITAMIN D (ERGOCALCIFEROL) 1.25 MG (50000 UNIT) PO CAPS
50000.0000 [IU] | ORAL_CAPSULE | ORAL | 0 refills | Status: DC
  Filled 2020-11-08: qty 12, 84d supply, fill #0

## 2020-11-08 MED ORDER — POTASSIUM CHLORIDE CRYS ER 10 MEQ PO TBCR
10.0000 meq | EXTENDED_RELEASE_TABLET | Freq: Two times a day (BID) | ORAL | 5 refills | Status: DC
  Filled 2020-11-08: qty 30, 15d supply, fill #0
  Filled 2020-11-24: qty 30, 15d supply, fill #1
  Filled 2020-12-07: qty 30, 15d supply, fill #2
  Filled 2021-01-01: qty 30, 15d supply, fill #3

## 2020-11-08 NOTE — Telephone Encounter (Signed)
Vitamin D is low. I recommend that she add vit D 50000 iu once weekly (rx sent) and repeat vit D in 12 weeks.   Kidney function is slightly improved.  Sugar is stable.    Potassium is a little low. Add K dur 10 meq once daily. Repeat bmet in 1 week.

## 2020-11-08 NOTE — Telephone Encounter (Signed)
Patient advised of results, provider's comments and new prescription. She will call back to set up lab appointment.

## 2020-11-15 ENCOUNTER — Other Ambulatory Visit (HOSPITAL_BASED_OUTPATIENT_CLINIC_OR_DEPARTMENT_OTHER): Payer: Self-pay

## 2020-11-16 ENCOUNTER — Other Ambulatory Visit (HOSPITAL_BASED_OUTPATIENT_CLINIC_OR_DEPARTMENT_OTHER): Payer: Self-pay

## 2020-11-17 ENCOUNTER — Other Ambulatory Visit (HOSPITAL_BASED_OUTPATIENT_CLINIC_OR_DEPARTMENT_OTHER): Payer: Self-pay

## 2020-11-23 ENCOUNTER — Other Ambulatory Visit (HOSPITAL_BASED_OUTPATIENT_CLINIC_OR_DEPARTMENT_OTHER): Payer: Self-pay

## 2020-11-23 ENCOUNTER — Other Ambulatory Visit (INDEPENDENT_AMBULATORY_CARE_PROVIDER_SITE_OTHER): Payer: 59

## 2020-11-23 ENCOUNTER — Other Ambulatory Visit: Payer: Self-pay | Admitting: Family

## 2020-11-23 ENCOUNTER — Other Ambulatory Visit: Payer: Self-pay

## 2020-11-23 DIAGNOSIS — E876 Hypokalemia: Secondary | ICD-10-CM | POA: Diagnosis not present

## 2020-11-23 MED ORDER — CARVEDILOL 6.25 MG PO TABS
ORAL_TABLET | Freq: Two times a day (BID) | ORAL | 1 refills | Status: DC
  Filled 2020-11-23: qty 180, 90d supply, fill #0
  Filled 2021-02-20: qty 180, 90d supply, fill #1

## 2020-11-24 LAB — BASIC METABOLIC PANEL
BUN: 18 mg/dL (ref 6–23)
CO2: 28 mEq/L (ref 19–32)
Calcium: 10.2 mg/dL (ref 8.4–10.5)
Chloride: 103 mEq/L (ref 96–112)
Creatinine, Ser: 1.29 mg/dL — ABNORMAL HIGH (ref 0.40–1.20)
GFR: 44.47 mL/min — ABNORMAL LOW (ref >60.00)
Glucose, Bld: 92 mg/dL (ref 70–99)
Potassium: 4 mEq/L (ref 3.5–5.1)
Sodium: 139 mEq/L (ref 135–145)

## 2020-11-25 ENCOUNTER — Other Ambulatory Visit (HOSPITAL_BASED_OUTPATIENT_CLINIC_OR_DEPARTMENT_OTHER): Payer: Self-pay

## 2020-12-02 ENCOUNTER — Other Ambulatory Visit (HOSPITAL_BASED_OUTPATIENT_CLINIC_OR_DEPARTMENT_OTHER): Payer: Self-pay

## 2020-12-08 ENCOUNTER — Other Ambulatory Visit (HOSPITAL_BASED_OUTPATIENT_CLINIC_OR_DEPARTMENT_OTHER): Payer: Self-pay

## 2020-12-13 ENCOUNTER — Other Ambulatory Visit (HOSPITAL_BASED_OUTPATIENT_CLINIC_OR_DEPARTMENT_OTHER): Payer: Self-pay

## 2020-12-13 DIAGNOSIS — F3181 Bipolar II disorder: Secondary | ICD-10-CM | POA: Diagnosis not present

## 2020-12-13 MED ORDER — ARIPIPRAZOLE 10 MG PO TABS: 10.0000 mg | ORAL_TABLET | Freq: Every day | ORAL | 1 refills | Status: DC

## 2020-12-13 MED ORDER — LAMOTRIGINE 100 MG PO TABS: 100.0000 mg | ORAL_TABLET | Freq: Two times a day (BID) | ORAL | 1 refills | Status: DC

## 2020-12-13 MED ORDER — TRAZODONE HCL 50 MG PO TABS: 50.0000 mg | ORAL_TABLET | Freq: Every evening | ORAL | 1 refills | Status: DC | PRN

## 2020-12-19 ENCOUNTER — Other Ambulatory Visit (HOSPITAL_BASED_OUTPATIENT_CLINIC_OR_DEPARTMENT_OTHER): Payer: Self-pay

## 2020-12-19 MED ORDER — AMOXICILLIN 500 MG PO CAPS
ORAL_CAPSULE | ORAL | 0 refills | Status: DC
  Filled 2020-12-19: qty 21, 7d supply, fill #0

## 2020-12-19 MED ORDER — CHLORHEXIDINE GLUCONATE 0.12 % MT SOLN
OROMUCOSAL | 0 refills | Status: DC
  Filled 2020-12-19: qty 473, 16d supply, fill #0

## 2021-01-01 ENCOUNTER — Other Ambulatory Visit: Payer: Self-pay | Admitting: Family

## 2021-01-02 ENCOUNTER — Other Ambulatory Visit: Payer: Self-pay | Admitting: Family

## 2021-01-02 ENCOUNTER — Other Ambulatory Visit: Payer: Self-pay

## 2021-01-02 ENCOUNTER — Other Ambulatory Visit (HOSPITAL_BASED_OUTPATIENT_CLINIC_OR_DEPARTMENT_OTHER): Payer: Self-pay

## 2021-01-02 MED ORDER — POTASSIUM CHLORIDE CRYS ER 10 MEQ PO TBCR
10.0000 meq | EXTENDED_RELEASE_TABLET | Freq: Two times a day (BID) | ORAL | 5 refills | Status: DC
  Filled 2021-01-02: qty 30, 15d supply, fill #0

## 2021-01-02 MED ORDER — HYDROCHLOROTHIAZIDE 25 MG PO TABS
25.0000 mg | ORAL_TABLET | Freq: Every day | ORAL | 0 refills | Status: DC
  Filled 2021-01-02: qty 90, 90d supply, fill #0

## 2021-01-23 ENCOUNTER — Other Ambulatory Visit: Payer: Self-pay | Admitting: Family

## 2021-01-23 ENCOUNTER — Other Ambulatory Visit (HOSPITAL_BASED_OUTPATIENT_CLINIC_OR_DEPARTMENT_OTHER): Payer: Self-pay

## 2021-01-24 ENCOUNTER — Other Ambulatory Visit (HOSPITAL_BASED_OUTPATIENT_CLINIC_OR_DEPARTMENT_OTHER): Payer: Self-pay

## 2021-01-24 MED ORDER — POTASSIUM CHLORIDE CRYS ER 10 MEQ PO TBCR
10.0000 meq | EXTENDED_RELEASE_TABLET | Freq: Two times a day (BID) | ORAL | 5 refills | Status: DC
  Filled 2021-01-24: qty 30, 15d supply, fill #0

## 2021-01-24 NOTE — Telephone Encounter (Signed)
Patient scheduled to come in for vitamin D check tomorrow.

## 2021-01-24 NOTE — Telephone Encounter (Signed)
Refused vit D. She needs follow up vit D level at 12 weeks and then hopefully we can change her to OTC vitamin D.

## 2021-01-24 NOTE — Telephone Encounter (Signed)
Future vit d lab was already in chart

## 2021-01-25 ENCOUNTER — Other Ambulatory Visit (INDEPENDENT_AMBULATORY_CARE_PROVIDER_SITE_OTHER): Payer: 59

## 2021-01-25 DIAGNOSIS — E559 Vitamin D deficiency, unspecified: Secondary | ICD-10-CM | POA: Diagnosis not present

## 2021-01-25 LAB — VITAMIN D 25 HYDROXY (VIT D DEFICIENCY, FRACTURES): VITD: 60.69 ng/mL (ref 30.00–100.00)

## 2021-01-27 ENCOUNTER — Telehealth: Payer: Self-pay | Admitting: Family

## 2021-01-27 MED ORDER — VITAMIN D3 75 MCG (3000 UT) PO TABS: 1.0000 | ORAL_TABLET | Freq: Every day | ORAL | Status: DC

## 2021-01-27 NOTE — Telephone Encounter (Signed)
Called patient to advised of vit D level has improved, patient agrees to stop taking the 50000 and agree to vit D 3000 once daily.

## 2021-01-27 NOTE — Telephone Encounter (Signed)
Please advise pt that her vit D level is improved.  I would like her to discontinue the weekly vit D 50000 iu and instead switch to OTC vit D 3000 iu once daily.

## 2021-02-13 ENCOUNTER — Other Ambulatory Visit (HOSPITAL_BASED_OUTPATIENT_CLINIC_OR_DEPARTMENT_OTHER): Payer: Self-pay

## 2021-02-20 ENCOUNTER — Other Ambulatory Visit (HOSPITAL_BASED_OUTPATIENT_CLINIC_OR_DEPARTMENT_OTHER): Payer: Self-pay

## 2021-03-06 ENCOUNTER — Other Ambulatory Visit (HOSPITAL_BASED_OUTPATIENT_CLINIC_OR_DEPARTMENT_OTHER): Payer: Self-pay

## 2021-03-13 ENCOUNTER — Other Ambulatory Visit (HOSPITAL_BASED_OUTPATIENT_CLINIC_OR_DEPARTMENT_OTHER): Payer: Self-pay

## 2021-03-28 ENCOUNTER — Other Ambulatory Visit (HOSPITAL_BASED_OUTPATIENT_CLINIC_OR_DEPARTMENT_OTHER): Payer: Self-pay

## 2021-03-28 DIAGNOSIS — F3181 Bipolar II disorder: Secondary | ICD-10-CM | POA: Diagnosis not present

## 2021-03-28 MED ORDER — ARIPIPRAZOLE 10 MG PO TABS
10.0000 mg | ORAL_TABLET | Freq: Every day | ORAL | 1 refills | Status: DC
  Filled 2021-05-23: qty 90, 90d supply, fill #0
  Filled 2021-08-26: qty 90, 90d supply, fill #1

## 2021-03-28 MED ORDER — LAMOTRIGINE 100 MG PO TABS
100.0000 mg | ORAL_TABLET | Freq: Two times a day (BID) | ORAL | 1 refills | Status: DC
  Filled 2021-06-15: qty 180, 90d supply, fill #0

## 2021-03-28 MED ORDER — TRAZODONE HCL 100 MG PO TABS
100.0000 mg | ORAL_TABLET | Freq: Every evening | ORAL | 2 refills | Status: DC | PRN
  Filled 2021-03-28 – 2021-04-07 (×2): qty 60, 30d supply, fill #0
  Filled 2021-05-25: qty 60, 30d supply, fill #1

## 2021-04-07 ENCOUNTER — Other Ambulatory Visit (HOSPITAL_BASED_OUTPATIENT_CLINIC_OR_DEPARTMENT_OTHER): Payer: Self-pay

## 2021-04-07 ENCOUNTER — Other Ambulatory Visit: Payer: Self-pay | Admitting: Family

## 2021-04-07 MED ORDER — ATORVASTATIN CALCIUM 20 MG PO TABS
ORAL_TABLET | Freq: Every day | ORAL | 1 refills | Status: DC
  Filled 2021-04-07: qty 90, 90d supply, fill #0
  Filled 2021-07-23: qty 90, 90d supply, fill #1

## 2021-04-07 MED ORDER — HYDROCHLOROTHIAZIDE 25 MG PO TABS
25.0000 mg | ORAL_TABLET | Freq: Every day | ORAL | 0 refills | Status: DC
  Filled 2021-04-07: qty 90, 90d supply, fill #0

## 2021-04-24 ENCOUNTER — Other Ambulatory Visit: Payer: Self-pay | Admitting: Family

## 2021-04-25 ENCOUNTER — Other Ambulatory Visit (HOSPITAL_BASED_OUTPATIENT_CLINIC_OR_DEPARTMENT_OTHER): Payer: Self-pay

## 2021-04-25 DIAGNOSIS — I129 Hypertensive chronic kidney disease with stage 1 through stage 4 chronic kidney disease, or unspecified chronic kidney disease: Secondary | ICD-10-CM | POA: Diagnosis not present

## 2021-04-25 DIAGNOSIS — N1831 Chronic kidney disease, stage 3a: Secondary | ICD-10-CM | POA: Diagnosis not present

## 2021-04-25 DIAGNOSIS — N2581 Secondary hyperparathyroidism of renal origin: Secondary | ICD-10-CM | POA: Diagnosis not present

## 2021-04-25 DIAGNOSIS — E785 Hyperlipidemia, unspecified: Secondary | ICD-10-CM | POA: Diagnosis not present

## 2021-04-25 DIAGNOSIS — D631 Anemia in chronic kidney disease: Secondary | ICD-10-CM | POA: Diagnosis not present

## 2021-04-25 DIAGNOSIS — N1832 Chronic kidney disease, stage 3b: Secondary | ICD-10-CM | POA: Diagnosis not present

## 2021-04-25 LAB — COMPREHENSIVE METABOLIC PANEL
Albumin: 4.9 (ref 3.5–5.0)
Calcium: 10.2 (ref 8.7–10.7)
eGFR: 50

## 2021-04-25 LAB — MICROALBUMIN, URINE: Microalb, Ur: 604

## 2021-04-25 LAB — BASIC METABOLIC PANEL
BUN: 13 (ref 4–21)
Creatinine: 1.2 — AB (ref 0.5–1.1)
Glucose: 123
Potassium: 3.1 mEq/L — AB (ref 3.5–5.1)
Sodium: 140 (ref 137–147)

## 2021-04-25 LAB — MICROALBUMIN / CREATININE URINE RATIO: Microalb Creat Ratio: 6

## 2021-04-25 LAB — CBC: RBC: 4.32 (ref 3.87–5.11)

## 2021-04-25 MED ORDER — AMLODIPINE BESYLATE-VALSARTAN 5-320 MG PO TABS
1.0000 | ORAL_TABLET | Freq: Every day | ORAL | 1 refills | Status: DC
  Filled 2021-04-25: qty 90, 90d supply, fill #0
  Filled 2021-07-23: qty 90, 90d supply, fill #1

## 2021-04-26 LAB — CBC AND DIFFERENTIAL
HCT: 40 (ref 36–46)
Hemoglobin: 13.8 (ref 12.0–16.0)
Neutrophils Absolute: 3.9
Platelets: 290 10*3/uL (ref 150–400)
WBC: 7.1

## 2021-05-09 ENCOUNTER — Ambulatory Visit: Payer: 59 | Admitting: Family

## 2021-05-09 VITALS — BP 116/67 | HR 65 | Temp 98.4°F | Resp 16 | Ht 64.0 in | Wt 149.0 lb

## 2021-05-09 DIAGNOSIS — N1832 Chronic kidney disease, stage 3b: Secondary | ICD-10-CM | POA: Diagnosis not present

## 2021-05-09 DIAGNOSIS — F32A Depression, unspecified: Secondary | ICD-10-CM

## 2021-05-09 DIAGNOSIS — I10 Essential (primary) hypertension: Secondary | ICD-10-CM

## 2021-05-09 DIAGNOSIS — E876 Hypokalemia: Secondary | ICD-10-CM | POA: Diagnosis not present

## 2021-05-09 DIAGNOSIS — E782 Mixed hyperlipidemia: Secondary | ICD-10-CM

## 2021-05-09 DIAGNOSIS — N183 Chronic kidney disease, stage 3 unspecified: Secondary | ICD-10-CM

## 2021-05-09 DIAGNOSIS — E1122 Type 2 diabetes mellitus with diabetic chronic kidney disease: Secondary | ICD-10-CM | POA: Diagnosis not present

## 2021-05-09 LAB — LIPID PANEL
Cholesterol: 101 mg/dL (ref 0–200)
HDL: 32.9 mg/dL — ABNORMAL LOW (ref >39.00)
LDL Cholesterol: 48 mg/dL (ref 0–99)
NonHDL: 68.23
Total CHOL/HDL Ratio: 3
Triglycerides: 101 mg/dL (ref 0.0–149.0)
VLDL: 20.2 mg/dL (ref 0.0–40.0)

## 2021-05-09 LAB — BASIC METABOLIC PANEL
BUN: 10 mg/dL (ref 6–23)
CO2: 31 mEq/L (ref 19–32)
Calcium: 9.9 mg/dL (ref 8.4–10.5)
Chloride: 100 mEq/L (ref 96–112)
Creatinine, Ser: 1.19 mg/dL (ref 0.40–1.20)
GFR: 48.83 mL/min — ABNORMAL LOW (ref >60.00)
Glucose, Bld: 90 mg/dL (ref 70–99)
Potassium: 3.6 mEq/L (ref 3.5–5.1)
Sodium: 139 mEq/L (ref 135–145)

## 2021-05-09 LAB — HEMOGLOBIN A1C: Hgb A1c MFr Bld: 5.8 % (ref 4.6–6.5)

## 2021-05-09 NOTE — Patient Instructions (Signed)
Please complete lab work prior to leaving.   

## 2021-05-09 NOTE — Assessment & Plan Note (Signed)
Stable. Works with Pauline Good, Psychiatry. ? ?

## 2021-05-09 NOTE — Assessment & Plan Note (Signed)
Lab Results  ?Component Value Date  ? CHOL 110 08/02/2020  ? HDL 33.40 (L) 08/02/2020  ? [place] 56 08/02/2020  ? TRIG 102.0 08/02/2020  ? CHOLHDL 3 08/02/2020  ? ?Stable on atorvastatin. Continue same.  ?

## 2021-05-09 NOTE — Assessment & Plan Note (Addendum)
Lab Results  ?Component Value Date  ? HGBA1C 5.9 11/07/2020  ? HGBA1C 5.5 04/29/2020  ? HGBA1C 6.3 12/25/2019  ? ?Lab Results  ?Component Value Date  ? MICROALBUR 604 04/25/2021  ? [place] 56 08/02/2020  ? CREATININE 1.2 (A) 04/25/2021  ? ?Wt Readings from Last 3 Encounters:  ?05/09/21 149 lb (67.6 kg)  ?11/07/20 155 lb (70.3 kg)  ?08/02/20 156 lb (70.8 kg)  ? ?She has lost a lot of weight and is exercising regularly. Sugars have remained low. I commended pt on her hard work.  ?

## 2021-05-09 NOTE — Assessment & Plan Note (Signed)
BP Readings from Last 3 Encounters:  ?05/09/21 116/67  ?11/07/20 118/73  ?08/02/20 130/69  ? ?BP at goal. Continue exforge, hctz, carvedilol.  ?

## 2021-05-09 NOTE — Assessment & Plan Note (Signed)
She continues to follow with nephrology.  ?

## 2021-05-09 NOTE — Progress Notes (Signed)
? ?Subjective:  ? ?By signing my name below, I, Carylon Perches, attest that this documentation has been prepared under the direction and in the presence of Debbrah Alar NP, 05/09/2021   ? ? Patient ID: Kaitlyn Harper, female    DOB: 08/17/1960, 61 y.o.   MRN: [id number] ? ?Chief Complaint  ?Patient presents with  ? Hypertension  ?  Here for follow up  ? ? ?HPI ?Patient is in today for an office visit.  ? ?Blood Pressure - As of today's visit, her blood pressure levels are good. She is continuing to take 25 MG of Hydrodiuril, 5 - 320 MG of Exforge and 6.25 MG of Coreg.   ?BP Readings from Last 3 Encounters:  ?05/09/21 116/67  ?11/07/20 118/73  ?08/02/20 130/69  ? ?Potassium - Her potassium levels are deceasing below normal range.  ?Lab Results  ?Component Value Date  ? K 3.1 (A) 04/25/2021  ? ?Depression - She states that her mood is fine. She is continuing to see a specialist.  ? ?Cholesterol - As of today's visit, her cholesterol levels are good. She is currently taking 20 MG of Atorvastatin. ?Lab Results  ?Component Value Date  ? CHOL 110 08/02/2020  ? HDL 33.40 (L) 08/02/2020  ? [place] 56 08/02/2020  ? TRIG 102.0 08/02/2020  ? CHOLHDL 3 08/02/2020  ? ?A1C - Her A1C levels are improving. ?Lab Results  ?Component Value Date  ? HGBA1C 5.9 11/07/2020  ?  ?Weight - She is losing weight. She has been walking and dancing. She also has been eating healthier.  ? ?COVID - 19 - bivalent - She has not received the COVID - 19 bivalent vaccine.  ? ?Health Maintenance Due  ?Topic Date Due  ? COVID-19 Vaccine (5 - Booster for Pfizer series) 12/05/2020  ? HEMOGLOBIN A1C  05/07/2021  ? ? ?Past Medical History:  ?Diagnosis Date  ? Anemia   ? resolved   ? Anxiety   ? Bipolar disorder ([place])   ? Depression   ? Diabetes mellitus without complication ([place])   ? type 2-diet controlled  ? Diverticulitis   ? History of diverticulitis 12/17/2018  ? Hyperglycemia   ? Hyperlipidemia   ? Hypertension   ? NSTEMI (non-ST elevated  myocardial infarction) ([place]) 2018  ? reports she fainted at his sons house and they took her to ED  , troponin was elevated and cardiac cath was completed,  no stent placed nor hx of stent intervention , she reports no casue was ever determined for the sycope episode   ? Syncope 03/18/2016  ? reports no recurrence   ? ? ?Past Surgical History:  ?Procedure Laterality Date  ? APPENDECTOMY  1985  ? BREAST BIOPSY Right 01/13/2014  ? benign  ? CHOLECYSTECTOMY  1985  ? COLONOSCOPY W/ POLYPECTOMY  03/01/2015  ? Dr Oretha Caprice, [facility].  +adenomatous polyps  ? COLONOSCOPY W/ POLYPECTOMY  02/28/2011  ? 4 polyps - 3 TA  ? DILATION AND CURETTAGE OF UTERUS  2005  ? HEMORRHOID SURGERY  2002  ? KNEE SURGERY  1983  ? arthroscopy?  ? LEFT HEART CATH AND CORONARY ANGIOGRAPHY N/A 03/19/2016  ? Procedure: Left Heart Cath and Coronary Angiography;  Surgeon: Peter M Martinique, MD;  Location: [facility];  Service: Cardiovascular;  Laterality: N/A;  ? MOUTH SURGERY  03/08/2016  ? POLYPECTOMY  2006  ? ?anal polyp?  ? TEAR DUCT PROBING  2004  ? TUBAL LIGATION  1985  ? ? ?Family  History  ?Problem Relation Age of Onset  ? Hypertension Father   ? Sarcoidosis Brother   ? Alcohol abuse Brother   ? Cirrhosis Brother   ? Stroke Maternal Grandmother   ? Colon cancer Neg Hx   ? Colon polyps Neg Hx   ? Esophageal cancer Neg Hx   ? Rectal cancer Neg Hx   ? Stomach cancer Neg Hx   ? Pulmonary embolism Neg Hx   ? ? ?Social History  ? ?Socioeconomic History  ? Marital status: Single  ?  Spouse name: Not on file  ? Number of children: 3  ? Years of education: Not on file  ? Highest education level: Not on file  ?Occupational History  ? Occupation: Therapist, sports SECRETARY  ?  Employer: [employer]  ?Tobacco Use  ? Smoking status: Every Day  ?  Packs/day: 0.50  ?  Types: Cigarettes  ?  Last attempt to quit: 07/02/2015  ?  Years since quitting: 5.8  ? Smokeless tobacco: Never  ? Tobacco comments:  ?  10 cigarettes daily; 10-30 reports " one to two  cigarettes every now and again"   ?Substance and Sexual Activity  ? Alcohol use: Yes  ?  Alcohol/week: 0.0 standard drinks  ?  Comment: socially  ? Drug use: No  ? Sexual activity: Yes  ?  Partners: Male  ?  Birth control/protection: Post-menopausal  ?Other Topics Concern  ? Not on file  ?Social History Narrative  ? Caffeine use:  2 drinks  ? Regular exercise: no  ? Lives with her daughter  ? 3 children   ? 2 grandchildren  ? 1 great grandson  ? 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 ?Social Determinants of Health  ? ?Financial Resource Strain: Not on file  ?Food Insecurity: Not on file  ?Transportation Needs: Not on file  ?Physical Activity: Not on file  ?Stress: Not on file  ?Social Connections: Not on file  ?Intimate Partner Violence: Not on file  ? ? ?Outpatient Medications Prior to Visit  ?Medication Sig Dispense Refill  ? acetaminophen (TYLENOL) 325 MG tablet Take 2 tablets (650 mg total) by mouth every 6 (six) hours as needed for mild pain (or Fever >/= 101).    ? amLODipine-valsartan (EXFORGE) 5-320 MG tablet TAKE 1 TABLET BY MOUTH ONCE DAILY 90 tablet 1  ? ARIPiprazole (ABILIFY) 10 MG tablet Take 1 tablet by mouth once a day 90 tablet 1  ? aspirin EC 81 MG tablet Take 81 mg by mouth daily.    ? atorvastatin (LIPITOR) 20 MG tablet TAKE 1 TABLET BY MOUTH ONCE DAILY 90 tablet 1  ? carvedilol (COREG) 6.25 MG tablet TAKE 1 TABLET BY MOUTH TWICE DAILY WITH A MEAL 180 tablet 1  ? chlorhexidine (PERIDEX) 0.12 % solution after brushing, rinse with one capful for one minute twice a day then spit out 473 mL 0  ? Cholecalciferol (VITAMIN D3) 75 MCG (3000 UT) TABS Take 1 tablet by mouth daily. 30 tablet   ? hydrochlorothiazide (HYDRODIURIL) 25 MG tablet Take 1 tablet (25 mg total) by mouth daily. 90 tablet 0  ? lamoTRIgine (LAMICTAL) 100 MG tablet TAKE 1 TABLET BY MOUTH TWICE A DAY 180 tablet 1  ? psyllium (METAMUCIL) 58.6 % powder Take 1 packet by mouth 3 (three) times daily.    ? traZODone (DESYREL) 100 MG tablet  TAKE 1-2 TABLETS BY MOUTH AT BEDTIME AS NEEDED FOR SLEEP 60 tablet 2  ? traZODone (DESYREL)  50 MG tablet TAKE 1-2 TABLETS BY MOUTH AT BEDTIME AS NEEDED FOR SLEEP 180 tablet 1  ? traZODone (DESYREL) 50 MG tablet TAKE 1-2 TABLETS BY MOUTH AT BEDTIME AS NEEDED FOR SLEEP 180 tablet 1  ? amoxicillin (AMOXIL) 500 MG capsule Take 1 capsule by mouth 3 times a day until gone 21 capsule 0  ? ARIPiprazole (ABILIFY) 10 MG tablet Take 1 tablet by mouth once a day 90 tablet 1  ? ARIPiprazole (ABILIFY) 10 MG tablet Take 1 tablet by mouth once a day 90 tablet 1  ? lamoTRIgine (LAMICTAL) 100 MG tablet Take 1 tablet by mouth 2 (two) times daily. 180 tablet 1  ? lamoTRIgine (LAMICTAL) 100 MG tablet TAKE 1 TABLET BY MOUTH TWICE A DAY 180 tablet 1  ? lamoTRIgine (LAMICTAL) 100 MG tablet TAKE 1 TABLET BY MOUTH TWICE A DAY 180 tablet 1  ? ?No facility-administered medications prior to visit.  ? ? ?Allergies  ?Allergen Reactions  ? Sulfonamide Derivatives Hives and Other (See Comments)  ?  Light sensitivity  ? ? ?ROS ? ?See HPI ?   ?Objective:  ?  ?Physical Exam ?Constitutional:   ?   General: She is not in acute distress. ?   Appearance: Normal appearance. She is not ill-appearing.  ?HENT:  ?   Head: Normocephalic and atraumatic.  ?   Right Ear: External ear normal.  ?   Left Ear: External ear normal.  ?Eyes:  ?   Extraocular Movements: Extraocular movements intact.  ?   Pupils: Pupils are equal, round, and reactive to light.  ?Cardiovascular:  ?   Rate and Rhythm: Normal rate and regular rhythm.  ?   Heart sounds: Normal heart sounds. No murmur heard. ?  No gallop.  ?Pulmonary:  ?   Effort: Pulmonary effort is normal. No respiratory distress.  ?   Breath sounds: Normal breath sounds. No wheezing or rales.  ?Skin: ?   General: Skin is warm and dry.  ?Neurological:  ?   Mental Status: She is alert and oriented to person, place, and time.  ?Psychiatric:     ?   Mood and Affect: Mood normal.     ?   Behavior: Behavior normal.     ?    Judgment: Judgment normal.  ? ? ?BP 116/67 (BP Location: Right Arm, Patient Position: Sitting, Cuff Size: Small)   Pulse 65   Temp 98.4 ?F (36.9 ?C) (Oral)   Resp 16   Ht '5\' 4"'$  (1.626 m)   Wt 149 lb (67.6 kg)   LMP 12/25/2008

## 2021-05-11 DIAGNOSIS — E119 Type 2 diabetes mellitus without complications: Secondary | ICD-10-CM | POA: Diagnosis not present

## 2021-05-11 DIAGNOSIS — H25813 Combined forms of age-related cataract, bilateral: Secondary | ICD-10-CM | POA: Diagnosis not present

## 2021-05-11 DIAGNOSIS — H35013 Changes in retinal vascular appearance, bilateral: Secondary | ICD-10-CM | POA: Diagnosis not present

## 2021-05-11 DIAGNOSIS — H524 Presbyopia: Secondary | ICD-10-CM | POA: Diagnosis not present

## 2021-05-11 DIAGNOSIS — H04211 Epiphora due to excess lacrimation, right lacrimal gland: Secondary | ICD-10-CM | POA: Diagnosis not present

## 2021-05-11 LAB — HM DIABETES EYE EXAM

## 2021-05-23 ENCOUNTER — Other Ambulatory Visit (HOSPITAL_BASED_OUTPATIENT_CLINIC_OR_DEPARTMENT_OTHER): Payer: Self-pay

## 2021-05-25 ENCOUNTER — Other Ambulatory Visit: Payer: Self-pay | Admitting: Family

## 2021-05-26 ENCOUNTER — Other Ambulatory Visit (HOSPITAL_BASED_OUTPATIENT_CLINIC_OR_DEPARTMENT_OTHER): Payer: Self-pay

## 2021-05-26 MED ORDER — CARVEDILOL 6.25 MG PO TABS
ORAL_TABLET | Freq: Two times a day (BID) | ORAL | 1 refills | Status: DC
  Filled 2021-05-26: qty 180, 90d supply, fill #0
  Filled 2021-08-26: qty 180, 90d supply, fill #1

## 2021-06-15 ENCOUNTER — Other Ambulatory Visit (HOSPITAL_BASED_OUTPATIENT_CLINIC_OR_DEPARTMENT_OTHER): Payer: Self-pay

## 2021-06-27 ENCOUNTER — Other Ambulatory Visit (HOSPITAL_BASED_OUTPATIENT_CLINIC_OR_DEPARTMENT_OTHER): Payer: Self-pay

## 2021-06-27 DIAGNOSIS — F3181 Bipolar II disorder: Secondary | ICD-10-CM | POA: Diagnosis not present

## 2021-06-27 MED ORDER — ARIPIPRAZOLE 10 MG PO TABS
10.0000 mg | ORAL_TABLET | Freq: Every day | ORAL | 1 refills | Status: DC
  Filled 2021-06-27 – 2021-11-27 (×2): qty 90, 90d supply, fill #0

## 2021-06-27 MED ORDER — TRAZODONE HCL 100 MG PO TABS
100.0000 mg | ORAL_TABLET | Freq: Every evening | ORAL | 5 refills | Status: DC | PRN
  Filled 2021-06-27 – 2021-08-01 (×2): qty 60, 30d supply, fill #0
  Filled 2021-10-09: qty 60, 30d supply, fill #1
  Filled 2021-11-29: qty 60, 30d supply, fill #2
  Filled 2022-02-14: qty 60, 30d supply, fill #3
  Filled 2022-04-10: qty 60, 30d supply, fill #4

## 2021-06-27 MED ORDER — LAMOTRIGINE 100 MG PO TABS
100.0000 mg | ORAL_TABLET | Freq: Two times a day (BID) | ORAL | 1 refills | Status: DC
  Filled 2021-06-27 – 2021-09-08 (×2): qty 180, 90d supply, fill #0
  Filled 2021-12-13 – 2022-01-10 (×2): qty 180, 90d supply, fill #1

## 2021-07-06 ENCOUNTER — Other Ambulatory Visit (HOSPITAL_BASED_OUTPATIENT_CLINIC_OR_DEPARTMENT_OTHER): Payer: Self-pay

## 2021-07-11 ENCOUNTER — Other Ambulatory Visit (HOSPITAL_BASED_OUTPATIENT_CLINIC_OR_DEPARTMENT_OTHER): Payer: Self-pay

## 2021-07-11 ENCOUNTER — Other Ambulatory Visit: Payer: Self-pay | Admitting: Family

## 2021-07-11 MED ORDER — HYDROCHLOROTHIAZIDE 25 MG PO TABS
25.0000 mg | ORAL_TABLET | Freq: Every day | ORAL | 1 refills | Status: DC
  Filled 2021-07-11: qty 90, 90d supply, fill #0
  Filled 2021-10-09: qty 90, 90d supply, fill #1

## 2021-07-24 ENCOUNTER — Other Ambulatory Visit (HOSPITAL_BASED_OUTPATIENT_CLINIC_OR_DEPARTMENT_OTHER): Payer: Self-pay

## 2021-07-26 ENCOUNTER — Encounter: Payer: Self-pay | Admitting: Surgery

## 2021-07-26 ENCOUNTER — Ambulatory Visit: Payer: Self-pay

## 2021-07-26 ENCOUNTER — Ambulatory Visit: Payer: 59 | Admitting: Surgery

## 2021-07-26 VITALS — BP 146/83 | HR 69 | Ht 64.0 in | Wt 149.0 lb

## 2021-07-26 DIAGNOSIS — M25521 Pain in right elbow: Secondary | ICD-10-CM | POA: Diagnosis not present

## 2021-07-26 DIAGNOSIS — M7021 Olecranon bursitis, right elbow: Secondary | ICD-10-CM

## 2021-07-26 MED ORDER — METHYLPREDNISOLONE ACETATE 40 MG/ML IJ SUSP
20.0000 mg | INTRAMUSCULAR | Status: AC | PRN
  Administered 2021-07-26: 20 mg via INTRA_ARTICULAR

## 2021-07-26 MED ORDER — LIDOCAINE HCL 1 % IJ SOLN
1.0000 mL | INTRAMUSCULAR | Status: AC | PRN
  Administered 2021-07-26: 1 mL

## 2021-07-26 MED ORDER — BUPIVACAINE HCL 0.5 % IJ SOLN
1.0000 mL | INTRAMUSCULAR | Status: AC | PRN
  Administered 2021-07-26: 1 mL via INTRA_ARTICULAR

## 2021-07-26 NOTE — Progress Notes (Signed)
Office Visit Note   Patient: Kaitlyn Harper           Date of Birth: 1960-07-05           MRN: [id number] Visit Date: 07/26/2021              Requested by: Debbrah Alar, NP [address] [place],  [place] [address] PCP: Debbrah Alar, NP   Assessment & Plan: Visit Diagnoses:  1. Pain in right elbow   2. Olecranon bursitis, right elbow     Plan: Recommended aspiration and injection.  After patient consent right elbow was prepped with Betadine and about 11 cc of serosanguineous fluid was aspirated from the olecranon bursa and then Marcaine/Depo-Medrol 1 to 1/2 injection performed.  Compression dressing applied.  Advised patient to wear this for 2 days and then she can remove.  If she is on the unit and I see her on Friday I will take a look at her elbow to see how this is doing.  I will have her follow-up as needed.  All questions answered.  Follow-Up Instructions: No follow-ups on file.   Orders:  Orders Placed This Encounter  Procedures   Medium Joint Inj   XR Elbow 2 Views Right   No orders of the defined types were placed in this encounter.     Procedures: Medium Joint Inj: R olecranon bursa on 07/26/2021 11:49 AM Details: posterior approach Medications: 1 mL lidocaine 1 %; 1 mL bupivacaine 0.5 %; 20 mg methylPREDNISolone acetate 40 MG/ML Aspirate: 11 mL blood-tinged Outcome: tolerated well, no immediate complications Consent was given by the patient.       Clinical Data: No additional findings.   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  Patient saw me in the hall couple days ago asked me about the swelling in her elbow.  I did a brief evaluation and thought that this was from olecranon bursitis.  States that she was at home about a week ago when she bumped the back of her  elbow on the door frame.  Shortly after she started having swelling of the olecranon bursa.  Not currently have any pain. Review of Systems No current cardiopulmonary GI/GU issues  Objective: Vital Signs: BP (!) 146/83   Pulse 69   Ht '5\' 4"'$  (1.626 m)   Wt 149 lb (67.6 kg)   LMP 12/25/2008   BMI 25.58 kg/m   Physical Exam HENT:     Head: Normocephalic and atraumatic.  Eyes:     Extraocular Movements: Extraocular movements intact.  Pulmonary:     Effort: No respiratory distress.  Musculoskeletal:     Comments: Right elbow she has good range of motion.  She does have mild to moderate swelling of the olecranon bursa.  No signs of infection.  No drainage.  She does have some tenderness over the olecranon.  Neurological:     Mental Status: She is alert and oriented to person, place, and time.  Psychiatric:        Mood and Affect: Mood normal.     Ortho Exam  Specialty Comments:  No specialty comments available.  Imaging: No results found.   PMFS History: Patient Active Problem List   Diagnosis Date Noted   Chronic renal failure, stage 3b ([place]) 08/02/2020   Bipolar affective  disorder, remission status unspecified ([place]) 08/02/2020   Uterine leiomyoma 10/31/2018   Depressive disorder 10/20/2018   Sigmoid diverticulitis with abscess s/p robotic LAR rectosigmoid resection 12/17/2018 10/20/2018   AV block, 1st degree 10/20/2018   History of adenomatous polyp of colon 10/20/2018   Anemia    Anxiety    Urogenital infection by trichomonas vaginalis 06/13/2017   Insomnia 06/11/2016   NSTEMI (non-ST elevated myocardial infarction) ([place]) 03/19/2016   Atypical squamous cells of undetermined significance (ASCUS) on Papanicolaou smear of cervix 12/23/2014   Seasonal allergies 05/23/2011   Preventative health care 01/29/2011   Vitamin D deficiency 12/26/2010   Hyperlipidemia 12/26/2010   Diabetes type 2, controlled ([place]) 12/26/2010   Essential hypertension 08/19/2008   Past  Medical History:  Diagnosis Date   Anemia    resolved    Anxiety    Bipolar disorder ([place])    Depression    Diabetes mellitus without complication ([place])    type 2-diet controlled   Diverticulitis    History of diverticulitis 12/17/2018   Hyperglycemia    Hyperlipidemia    Hypertension    NSTEMI (non-ST elevated myocardial infarction) ([place]) 2018   reports she fainted at his sons house and they took her to ED  , troponin was elevated and cardiac cath was completed,  no stent placed nor hx of stent intervention , she reports no casue was ever determined for the sycope episode    Syncope 03/18/2016   reports no recurrence     Family History  Problem Relation Age of Onset   Hypertension Father    Sarcoidosis Brother    Alcohol abuse Brother    Cirrhosis Brother    Stroke Maternal Grandmother    Colon cancer Neg Hx    Colon polyps Neg Hx    Esophageal cancer Neg Hx    Rectal cancer Neg Hx    Stomach cancer Neg Hx    Pulmonary embolism Neg Hx     Past Surgical History:  Procedure Laterality Date   APPENDECTOMY  1985   BREAST BIOPSY Right 01/13/2014   benign   CHOLECYSTECTOMY  1985   COLONOSCOPY W/ POLYPECTOMY  03/01/2015   Dr Oretha Caprice,  GI.  +adenomatous polyps   COLONOSCOPY W/ POLYPECTOMY  02/28/2011   4 polyps - 3 TA   DILATION AND CURETTAGE OF UTERUS  2005   HEMORRHOID SURGERY  2002   KNEE SURGERY  1983   arthroscopy?   LEFT HEART CATH AND CORONARY ANGIOGRAPHY N/A 03/19/2016   Procedure: Left Heart Cath and Coronary Angiography;  Surgeon: Peter M Martinique, MD;  Location: [facility];  Service: Cardiovascular;  Laterality: N/A;   MOUTH SURGERY  03/08/2016   POLYPECTOMY  2006   ?anal polyp?   TEAR DUCT PROBING  2004   TUBAL LIGATION  1985   Social History   Occupational History   Occupation: Water quality scientist: [employer]  Tobacco Use   Smoking status: Every Day    Packs/day: 0.50    Types: Cigarettes    Last attempt to quit:  07/02/2015    Years since quitting: 6.0   Smokeless tobacco: Never   Tobacco comments:    10 cigarettes daily; 10-30 reports " one to two cigarettes every now and again"   Substance and Sexual Activity   Alcohol use: Yes    Alcohol/week: 0.0 standard drinks of alcohol    Comment: socially   Drug use: No   Sexual activity:  Yes    Partners: Male    Birth control/protection: Post-menopausal

## 2021-07-31 ENCOUNTER — Other Ambulatory Visit (HOSPITAL_BASED_OUTPATIENT_CLINIC_OR_DEPARTMENT_OTHER): Payer: Self-pay

## 2021-08-02 ENCOUNTER — Other Ambulatory Visit (HOSPITAL_BASED_OUTPATIENT_CLINIC_OR_DEPARTMENT_OTHER): Payer: Self-pay

## 2021-08-08 ENCOUNTER — Other Ambulatory Visit: Payer: Self-pay

## 2021-08-08 MED ORDER — PREVIDENT 0.2 % MT SOLN
OROMUCOSAL | 5 refills | Status: DC
Start: 2021-08-08 — End: 2022-05-21
  Filled 2021-08-08 – 2021-08-23 (×2): qty 473, 30d supply, fill #0

## 2021-08-09 ENCOUNTER — Other Ambulatory Visit: Payer: Self-pay

## 2021-08-23 ENCOUNTER — Other Ambulatory Visit (HOSPITAL_BASED_OUTPATIENT_CLINIC_OR_DEPARTMENT_OTHER): Payer: Self-pay

## 2021-08-23 ENCOUNTER — Other Ambulatory Visit: Payer: Self-pay

## 2021-08-24 ENCOUNTER — Other Ambulatory Visit (HOSPITAL_BASED_OUTPATIENT_CLINIC_OR_DEPARTMENT_OTHER): Payer: Self-pay

## 2021-08-28 ENCOUNTER — Other Ambulatory Visit (HOSPITAL_BASED_OUTPATIENT_CLINIC_OR_DEPARTMENT_OTHER): Payer: Self-pay

## 2021-09-08 ENCOUNTER — Other Ambulatory Visit (HOSPITAL_BASED_OUTPATIENT_CLINIC_OR_DEPARTMENT_OTHER): Payer: Self-pay

## 2021-09-11 ENCOUNTER — Telehealth: Payer: 59 | Admitting: Nurse Practitioner

## 2021-09-11 ENCOUNTER — Other Ambulatory Visit (HOSPITAL_BASED_OUTPATIENT_CLINIC_OR_DEPARTMENT_OTHER): Payer: Self-pay

## 2021-09-11 DIAGNOSIS — N3000 Acute cystitis without hematuria: Secondary | ICD-10-CM

## 2021-09-11 MED ORDER — CEPHALEXIN 500 MG PO CAPS
500.0000 mg | ORAL_CAPSULE | Freq: Two times a day (BID) | ORAL | 0 refills | Status: AC
  Filled 2021-09-11: qty 14, 7d supply, fill #0

## 2021-09-11 MED ORDER — PHENAZOPYRIDINE HCL 100 MG PO TABS
100.0000 mg | ORAL_TABLET | Freq: Two times a day (BID) | ORAL | 0 refills | Status: AC | PRN
  Filled 2021-09-11: qty 4, 2d supply, fill #0

## 2021-09-11 NOTE — Progress Notes (Addendum)
Virtual Visit Consent   [name], you are scheduled for a virtual visit with a [facility] provider today. Just as with appointments in the office, your consent must be obtained to participate. Your consent will be active for this visit and any virtual visit you may have with one of our providers in the next 365 days. If you have a MyChart account, a copy of this consent can be sent to you electronically.  As this is a virtual visit, video technology does not allow for your provider to perform a traditional examination. This may limit your provider's ability to fully assess your condition. If your provider identifies any concerns that need to be evaluated in person or the need to arrange testing (such as labs, EKG, etc.), we will make arrangements to do so. Although advances in technology are sophisticated, we cannot ensure that it will always work on either your end or our end. If the connection with a video visit is poor, the visit may have to be switched to a telephone visit. With either a video or telephone visit, we are not always able to ensure that we have a secure connection.  By engaging in this virtual visit, you consent to the provision of healthcare and authorize for your insurance to be billed (if applicable) for the services provided during this visit. Depending on your insurance coverage, you may receive a charge related to this service.  I need to obtain your verbal consent now. Are you willing to proceed with your visit today? [name] has provided verbal consent on 09/11/2021 for a virtual visit (video or telephone). [name], FNP  Date: 09/11/2021 11:19 AM  Virtual Visit via Video Note   I, [name], connected with  Kaitlyn Harper  ([id number], 12/06/1960) on 09/11/21 at 11:45 AM EDT by a video-enabled telemedicine application and verified that I am speaking with the correct person using two identifiers.  Location: Patient: Virtual Visit Location  Patient: Other: =work  Provider: Virtual Visit Location Provider: Home Office   I discussed the limitations of evaluation and management by telemedicine and the availability of in person appointments. The patient expressed understanding and agreed to proceed.    History of Present Illness: Kaitlyn Harper is a 61 y.o. who identifies as a female who was assigned female at birth, and is being seen today with complaints of urinary frequency, burning with urination and noted cloudy urine.   It has been years since her last UTI Denies complicated UTIs in the past  Denies fever back pain or abdominal pain Denies N/V    She has not taken anything OTC  Symptom onset was 3 days ago   Problems:  Patient Active Problem List   Diagnosis Date Noted   Chronic renal failure, stage 3b ([place]) 08/02/2020   Bipolar affective disorder, remission status unspecified ([place]) 08/02/2020   Uterine leiomyoma 10/31/2018   Depressive disorder 10/20/2018   Sigmoid diverticulitis with abscess s/p robotic LAR rectosigmoid resection 12/17/2018 10/20/2018   AV block, 1st degree 10/20/2018   History of adenomatous polyp of colon 10/20/2018   Anemia    Anxiety    Urogenital infection by trichomonas vaginalis 06/13/2017   Insomnia 06/11/2016   NSTEMI (non-ST elevated myocardial infarction) ([place]) 03/19/2016   Atypical squamous cells of undetermined significance (ASCUS) on Papanicolaou smear of cervix 12/23/2014   Seasonal allergies 05/23/2011   Preventative health care 01/29/2011   Vitamin D deficiency 12/26/2010   Hyperlipidemia 12/26/2010   Diabetes type  2, controlled ([place]) 12/26/2010   Essential hypertension 08/19/2008    Allergies:  Allergies  Allergen Reactions   Sulfonamide Derivatives Hives and Other (See Comments)    Light sensitivity   Medications:  Current Outpatient Medications:    acetaminophen (TYLENOL) 325 MG tablet, Take 2 tablets (650 mg total) by mouth every 6 (six) hours as needed for mild  pain (or Fever >/= 101)., Disp: , Rfl:    amLODipine-valsartan (EXFORGE) 5-320 MG tablet, TAKE 1 TABLET BY MOUTH ONCE DAILY, Disp: 90 tablet, Rfl: 1   ARIPiprazole (ABILIFY) 10 MG tablet, Take 1 tablet by mouth once a day, Disp: 90 tablet, Rfl: 1   ARIPiprazole (ABILIFY) 10 MG tablet, Take 1 tablet by mouth once a day, Disp: 90 tablet, Rfl: 1   aspirin EC 81 MG tablet, Take 81 mg by mouth daily., Disp: , Rfl:    atorvastatin (LIPITOR) 20 MG tablet, TAKE 1 TABLET BY MOUTH ONCE DAILY, Disp: 90 tablet, Rfl: 1   carvedilol (COREG) 6.25 MG tablet, TAKE 1 TABLET BY MOUTH TWICE DAILY WITH A MEAL, Disp: 180 tablet, Rfl: 1   chlorhexidine (PERIDEX) 0.12 % solution, after brushing, rinse with one capful for one minute twice a day then spit out, Disp: 473 mL, Rfl: 0   Cholecalciferol (VITAMIN D3) 75 MCG (3000 UT) TABS, Take 1 tablet by mouth daily., Disp: 30 tablet, Rfl:    hydrochlorothiazide (HYDRODIURIL) 25 MG tablet, Take 1 tablet (25 mg total) by mouth daily., Disp: 90 tablet, Rfl: 1   lamoTRIgine (LAMICTAL) 100 MG tablet, TAKE 1 TABLET BY MOUTH TWICE A DAY, Disp: 180 tablet, Rfl: 1   lamoTRIgine (LAMICTAL) 100 MG tablet, TAKE 1 TABLET BY MOUTH TWICE A DAY, Disp: 180 tablet, Rfl: 1   psyllium (METAMUCIL) 58.6 % powder, Take 1 packet by mouth 3 (three) times daily., Disp: , Rfl:    SODIUM FLUORIDE, DENTAL RINSE, (PREVIDENT) 0.2 % SOLN, USE AS AN ORAL RINSE, AS DIRECTED ON PACKAGE, Disp: 473 mL, Rfl: 5   traZODone (DESYREL) 100 MG tablet, TAKE 1-2 TABLETS BY MOUTH AT BEDTIME AS NEEDED FOR SLEEP, Disp: 60 tablet, Rfl: 2   traZODone (DESYREL) 100 MG tablet, TAKE 1-2 TABLETS BY MOUTH AT BEDTIME AS NEEDED FOR SLEEP, Disp: 60 tablet, Rfl: 5  Observations/Objective: Patient is well-developed, well-nourished in no acute distress.  Resting comfortably at work   Head is normocephalic, atraumatic.  No labored breathing.  Speech is clear and coherent with logical content.  Patient is alert and oriented at  baseline.    Assessment and Plan: 1. Acute cystitis without hematuria  - cephALEXin (KEFLEX) 500 MG capsule; Take 1 capsule (500 mg total) by mouth 2 (two) times daily for 7 days.  Dispense: 14 capsule; Refill: 0 - phenazopyridine (PYRIDIUM) 100 MG tablet; Take 1 tablet (100 mg total) by mouth 2 (two) times daily as needed for up to 2 days for pain.  Dispense: 4 tablet; Refill: 0     Follow Up Instructions: I discussed the assessment and treatment plan with the patient. The patient was provided an opportunity to ask questions and all were answered. The patient agreed with the plan and demonstrated an understanding of the instructions.  A copy of instructions were sent to the patient via MyChart unless otherwise noted below.    The patient was advised to call back or seek an in-person evaluation if the symptoms worsen or if the condition fails to improve as anticipated.  Time:  I spent 15 minutes with the  patient via telehealth technology discussing the above problems/concerns.    [name], FNP

## 2021-09-25 DIAGNOSIS — I214 Non-ST elevation (NSTEMI) myocardial infarction: Secondary | ICD-10-CM | POA: Insufficient documentation

## 2021-09-25 DIAGNOSIS — L658 Other specified nonscarring hair loss: Secondary | ICD-10-CM | POA: Insufficient documentation

## 2021-09-25 DIAGNOSIS — N926 Irregular menstruation, unspecified: Secondary | ICD-10-CM | POA: Insufficient documentation

## 2021-10-09 ENCOUNTER — Other Ambulatory Visit (HOSPITAL_BASED_OUTPATIENT_CLINIC_OR_DEPARTMENT_OTHER): Payer: Self-pay

## 2021-10-09 DIAGNOSIS — Z124 Encounter for screening for malignant neoplasm of cervix: Secondary | ICD-10-CM | POA: Diagnosis not present

## 2021-10-09 DIAGNOSIS — Z1211 Encounter for screening for malignant neoplasm of colon: Secondary | ICD-10-CM | POA: Diagnosis not present

## 2021-10-09 DIAGNOSIS — Z1239 Encounter for other screening for malignant neoplasm of breast: Secondary | ICD-10-CM | POA: Diagnosis not present

## 2021-10-09 DIAGNOSIS — Z6824 Body mass index (BMI) 24.0-24.9, adult: Secondary | ICD-10-CM | POA: Diagnosis not present

## 2021-10-09 DIAGNOSIS — Z01419 Encounter for gynecological examination (general) (routine) without abnormal findings: Secondary | ICD-10-CM | POA: Diagnosis not present

## 2021-10-19 LAB — HM PAP SMEAR: HM Pap smear: NEGATIVE

## 2021-10-24 ENCOUNTER — Other Ambulatory Visit: Payer: Self-pay | Admitting: Family

## 2021-10-25 ENCOUNTER — Other Ambulatory Visit (HOSPITAL_BASED_OUTPATIENT_CLINIC_OR_DEPARTMENT_OTHER): Payer: Self-pay

## 2021-10-25 MED ORDER — ATORVASTATIN CALCIUM 20 MG PO TABS
20.0000 mg | ORAL_TABLET | Freq: Every day | ORAL | 0 refills | Status: DC
  Filled 2021-10-25: qty 90, 90d supply, fill #0

## 2021-10-25 MED ORDER — AMLODIPINE BESYLATE-VALSARTAN 5-320 MG PO TABS
1.0000 | ORAL_TABLET | Freq: Every day | ORAL | 0 refills | Status: DC
  Filled 2021-10-25: qty 90, 90d supply, fill #0

## 2021-11-10 ENCOUNTER — Ambulatory Visit: Payer: 59 | Admitting: Family

## 2021-11-10 ENCOUNTER — Other Ambulatory Visit (HOSPITAL_BASED_OUTPATIENT_CLINIC_OR_DEPARTMENT_OTHER): Payer: Self-pay

## 2021-11-10 VITALS — BP 139/82 | HR 77 | Temp 98.2°F | Resp 16 | Wt 146.0 lb

## 2021-11-10 DIAGNOSIS — Z23 Encounter for immunization: Secondary | ICD-10-CM

## 2021-11-10 DIAGNOSIS — N183 Chronic kidney disease, stage 3 unspecified: Secondary | ICD-10-CM

## 2021-11-10 DIAGNOSIS — F32A Depression, unspecified: Secondary | ICD-10-CM

## 2021-11-10 DIAGNOSIS — I10 Essential (primary) hypertension: Secondary | ICD-10-CM | POA: Diagnosis not present

## 2021-11-10 DIAGNOSIS — E1122 Type 2 diabetes mellitus with diabetic chronic kidney disease: Secondary | ICD-10-CM

## 2021-11-10 DIAGNOSIS — G4700 Insomnia, unspecified: Secondary | ICD-10-CM | POA: Diagnosis not present

## 2021-11-10 DIAGNOSIS — N1832 Chronic kidney disease, stage 3b: Secondary | ICD-10-CM

## 2021-11-10 DIAGNOSIS — E782 Mixed hyperlipidemia: Secondary | ICD-10-CM

## 2021-11-10 MED ORDER — CARVEDILOL 6.25 MG PO TABS
6.2500 mg | ORAL_TABLET | Freq: Two times a day (BID) | ORAL | 1 refills | Status: DC
  Filled 2021-11-10: qty 180, fill #0
  Filled 2021-11-27: qty 180, 90d supply, fill #0
  Filled 2022-03-11 – 2022-03-21 (×2): qty 180, 90d supply, fill #1

## 2021-11-10 MED ORDER — HYDROCHLOROTHIAZIDE 25 MG PO TABS
25.0000 mg | ORAL_TABLET | Freq: Every day | ORAL | 1 refills | Status: DC
  Filled 2021-11-10 – 2022-01-05 (×2): qty 90, 90d supply, fill #0
  Filled 2022-04-10: qty 90, 90d supply, fill #1

## 2021-11-10 MED ORDER — AMLODIPINE BESYLATE-VALSARTAN 5-320 MG PO TABS
1.0000 | ORAL_TABLET | Freq: Every day | ORAL | 0 refills | Status: DC
  Filled 2021-11-10 – 2022-02-12 (×2): qty 90, 90d supply, fill #0

## 2021-11-10 MED ORDER — ATORVASTATIN CALCIUM 20 MG PO TABS
20.0000 mg | ORAL_TABLET | Freq: Every day | ORAL | 0 refills | Status: DC
  Filled 2021-11-10 – 2022-02-12 (×2): qty 90, 90d supply, fill #0

## 2021-11-10 NOTE — Assessment & Plan Note (Signed)
Lab Results  Component Value Date   CHOL 101 05/09/2021   HDL 32.90 (L) 05/09/2021   LDLCALC 48 05/09/2021   TRIG 101.0 05/09/2021   CHOLHDL 3 05/09/2021   LDL at goal Continue carvedilol.

## 2021-11-10 NOTE — Assessment & Plan Note (Signed)
Follows with nephrology.

## 2021-11-10 NOTE — Assessment & Plan Note (Signed)
Stable, continues with psychiatry.

## 2021-11-10 NOTE — Assessment & Plan Note (Signed)
BP Readings from Last 3 Encounters:  11/10/21 139/82  07/26/21 (!) 146/83  05/09/21 116/67   Stable on carvedilol, exforge and coreg. Continue same.

## 2021-11-10 NOTE — Progress Notes (Signed)
Subjective:   By signing my name below, I, Carylon Perches, attest that this documentation has been prepared under the direction and in the presence of Jeri Lager' Suvillivan, NP 11/10/2021      Patient ID: Kaitlyn Harper, female    DOB: 02/02/1961, 61 y.o.   MRN: [id number]  Chief Complaint  Patient presents with   Hypertension    Here for follow up    HPI Patient is in today for an office visit.  Refills: She is requesting a refill of 25 Mg of Hydrochlorothiazide, 6.25 Mg of Carvedilol, 20 Mg of Lipitor, 5 - 320 Mg of Exforge.   Blood Pressure: Her blood pressure is normal. She is currently taking 6.25 Mg of Carvedilol, 25 Mg of Hydrochlorothiazide, 5-320 Mg of Exforge. BP Readings from Last 3 Encounters:  11/10/21 139/82  07/26/21 (!) 146/83  05/09/21 116/67   Pulse Readings from Last 3 Encounters:  11/10/21 77  07/26/21 69  05/09/21 65   Mood: She reports that her mood is fine. She is regularly following up with a psychiatrist   Kidney: She is regularly following up with her kidney specialist   A1C: Her A1C levels are normal. She is maintaining a healthy diet and exercising regularly.  Lab Results  Component Value Date   HGBA1C 5.8 05/09/2021   Cholesterol: Her cholesterol levels are normal. However, her HDL levels are elevating  Lab Results  Component Value Date   CHOL 101 05/09/2021   HDL 32.90 (L) 05/09/2021   LDLCALC 48 05/09/2021   TRIG 101.0 05/09/2021   CHOLHDL 3 05/09/2021   Sleeping: She reports that she is sleeping fine. She is currently taking 100 Mg of Trazodone.   Immunizations: She is interested in receiving her influenza vaccine during today's visit.   Smoking: She is currently smoking about half a pack a day.   Health Maintenance Due  Topic Date Due   COVID-19 Vaccine (5 - Pfizer series) 12/05/2020   FOOT EXAM  08/02/2021   INFLUENZA VACCINE  09/12/2021   PAP SMEAR-Modifier  09/17/2021   HEMOGLOBIN A1C  11/09/2021    Past Medical  History:  Diagnosis Date   Anemia    resolved    Anxiety    Bipolar disorder ([place])    Depression    Diabetes mellitus without complication ([place])    type 2-diet controlled   Diverticulitis    History of diverticulitis 12/17/2018   Hyperglycemia    Hyperlipidemia    Hypertension    NSTEMI (non-ST elevated myocardial infarction) ([place]) 2018   reports she fainted at his sons house and they took her to ED  , troponin was elevated and cardiac cath was completed,  no stent placed nor hx of stent intervention , she reports no casue was ever determined for the sycope episode    Syncope 03/18/2016   reports no recurrence     Past Surgical History:  Procedure Laterality Date   APPENDECTOMY  1985   BREAST BIOPSY Right 01/13/2014   benign   CHOLECYSTECTOMY  1985   COLONOSCOPY W/ POLYPECTOMY  03/01/2015   Dr Oretha Caprice, [facility].  +adenomatous polyps   COLONOSCOPY W/ POLYPECTOMY  02/28/2011   4 polyps - 3 TA   DILATION AND CURETTAGE OF UTERUS  2005   HEMORRHOID SURGERY  2002   KNEE SURGERY  1983   arthroscopy?   LEFT HEART CATH AND CORONARY ANGIOGRAPHY N/A 03/19/2016   Procedure: Left Heart Cath and Coronary Angiography;  Surgeon: Ander Slade  [place], MD;  Location: [facility];  Service: Cardiovascular;  Laterality: N/A;   MOUTH SURGERY  03/08/2016   POLYPECTOMY  2006   ?anal polyp?   TEAR DUCT PROBING  2004   TUBAL LIGATION  1985    Family History  Problem Relation Age of Onset   Hypertension Father    Sarcoidosis Brother    Alcohol abuse Brother    Cirrhosis Brother    Stroke Maternal Grandmother    Colon cancer Neg Hx    Colon polyps Neg Hx    Esophageal cancer Neg Hx    Rectal cancer Neg Hx    Stomach cancer Neg Hx    Pulmonary embolism Neg Hx     Social History   Socioeconomic History   Marital status: Single    Spouse name: Not on file   Number of children: 3   Years of education: Not on file   Highest education level: Not on file  Occupational History    Occupation: Teacher, music    Employer: [employer]  Tobacco Use   Smoking status: Every Day    Packs/day: 0.50    Types: Cigarettes    Last attempt to quit: 07/02/2015    Years since quitting: 6.3   Smokeless tobacco: Never   Tobacco comments:    10 cigarettes daily; 10-30 reports " one to two cigarettes every now and again"   Substance and Sexual Activity   Alcohol use: Yes    Alcohol/week: 0.0 standard drinks of alcohol    Comment: socially   Drug use: No   Sexual activity: Yes    Partners: Male    Birth control/protection: Post-menopausal  Other Topics Concern   Not on file  Social History Narrative   Caffeine use:  2 drinks   Regular exercise: no   Lives with her daughter   3 children    2 grandchildren   17 great grandson   Works as a Biomedical engineer at [employer] Strain: Not on [employer] Insecurity: Not on file  Transportation Needs: Not on file  Physical Activity: Not on file  Stress: Not on file  Social Connections: Not on file  Intimate Partner Violence: Not on file    Outpatient Medications Prior to Visit  Medication Sig Dispense Refill   ARIPiprazole (ABILIFY) 10 MG tablet Take 1 tablet by mouth once a day 90 tablet 1   ARIPiprazole (ABILIFY) 10 MG tablet Take 1 tablet by mouth once a day 90 tablet 1   aspirin EC 81 MG tablet Take 81 mg by mouth daily.     lamoTRIgine (LAMICTAL) 100 MG tablet TAKE 1 TABLET BY MOUTH TWICE A DAY 180 tablet 1   lamoTRIgine (LAMICTAL) 100 MG tablet TAKE 1 TABLET BY MOUTH TWICE A DAY 180 tablet 1   psyllium (METAMUCIL) 58.6 % powder Take 1 packet by mouth 3 (three) times daily.     SODIUM FLUORIDE, DENTAL RINSE, (PREVIDENT) 0.2 % SOLN USE AS AN ORAL RINSE, AS DIRECTED ON PACKAGE 473 mL 5   traZODone (DESYREL) 100 MG tablet TAKE 1-2 TABLETS BY MOUTH AT BEDTIME AS NEEDED FOR SLEEP 60 tablet 2   traZODone (DESYREL) 100 MG tablet TAKE 1-2 TABLETS BY MOUTH AT BEDTIME AS NEEDED  FOR SLEEP 60 tablet 5   amLODipine-valsartan (EXFORGE) 5-320 MG tablet Take 1 tablet by mouth daily. 90 tablet 0   atorvastatin (LIPITOR) 20 MG tablet Take  1 tablet (20 mg total) by mouth daily. 90 tablet 0   carvedilol (COREG) 6.25 MG tablet TAKE 1 TABLET BY MOUTH TWICE DAILY WITH A MEAL 180 tablet 1   hydrochlorothiazide (HYDRODIURIL) 25 MG tablet Take 1 tablet (25 mg total) by mouth daily. 90 tablet 1   acetaminophen (TYLENOL) 325 MG tablet Take 2 tablets (650 mg total) by mouth every 6 (six) hours as needed for mild pain (or Fever >/= 101).     chlorhexidine (PERIDEX) 0.12 % solution after brushing, rinse with one capful for one minute twice a day then spit out 473 mL 0   Cholecalciferol (VITAMIN D3) 75 MCG (3000 UT) TABS Take 1 tablet by mouth daily. 30 tablet    No facility-administered medications prior to visit.    Allergies  Allergen Reactions   Sulfonamide Derivatives Hives and Other (See Comments)    Light sensitivity    ROS     Objective:    Physical Exam Constitutional:      General: She is not in acute distress.    Appearance: Normal appearance. She is not ill-appearing.  HENT:     Head: Normocephalic and atraumatic.     Right Ear: External ear normal.     Left Ear: External ear normal.  Eyes:     Extraocular Movements: Extraocular movements intact.     Pupils: Pupils are equal, round, and reactive to light.  Cardiovascular:     Rate and Rhythm: Normal rate and regular rhythm.     Pulses:          Dorsalis pedis pulses are 2+ on the right side and 2+ on the left side.       Posterior tibial pulses are 2+ on the right side and 2+ on the left side.     Heart sounds: Normal heart sounds. No murmur heard.    No gallop.  Pulmonary:     Effort: Pulmonary effort is normal. No respiratory distress.     Breath sounds: Normal breath sounds. No wheezing or rales.  Skin:    General: Skin is warm and dry.  Neurological:     Mental Status: She is alert and oriented to  person, place, and time.  Psychiatric:        Mood and Affect: Mood normal.        Behavior: Behavior normal.        Judgment: Judgment normal.     BP 139/82 (BP Location: Right Arm, Patient Position: Sitting, Cuff Size: Small)   Pulse 77   Temp 98.2 F (36.8 C) (Oral)   Resp 16   Wt 146 lb (66.2 kg)   LMP 12/25/2008   SpO2 100%   BMI 25.06 kg/m  Wt Readings from Last 3 Encounters:  11/10/21 146 lb (66.2 kg)  07/26/21 149 lb (67.6 kg)  05/09/21 149 lb (67.6 kg)   Diabetic Foot Exam - Simple   No data filed        Assessment & Plan:   Problem List Items Addressed This Visit       Unprioritized   Hyperlipidemia (Chronic)    Lab Results  Component Value Date   CHOL 101 05/09/2021   HDL 32.90 (L) 05/09/2021   LDLCALC 48 05/09/2021   TRIG 101.0 05/09/2021   CHOLHDL 3 05/09/2021  LDL at goal Continue carvedilol.       Relevant Medications   hydrochlorothiazide (HYDRODIURIL) 25 MG tablet   carvedilol (COREG) 6.25 MG tablet   atorvastatin (LIPITOR) 20  MG tablet   amLODipine-valsartan (EXFORGE) 5-320 MG tablet   Insomnia    Reports sleep is stable. She uses trazodone HS.       Essential hypertension    BP Readings from Last 3 Encounters:  11/10/21 139/82  07/26/21 (!) 146/83  05/09/21 116/67  Stable on carvedilol, exforge and coreg. Continue same.       Relevant Medications   hydrochlorothiazide (HYDRODIURIL) 25 MG tablet   carvedilol (COREG) 6.25 MG tablet   atorvastatin (LIPITOR) 20 MG tablet   amLODipine-valsartan (EXFORGE) 5-320 MG tablet   Diabetes type 2, controlled ([place])    Lab Results  Component Value Date   HGBA1C 5.8 05/09/2021   HGBA1C 5.9 11/07/2020   HGBA1C 5.5 04/29/2020   Lab Results  Component Value Date   MICROALBUR 604 04/25/2021   LDLCALC 48 05/09/2021   CREATININE 1.19 05/09/2021  Managing with diet, exercise, weight control.       Relevant Medications   atorvastatin (LIPITOR) 20 MG tablet   amLODipine-valsartan (EXFORGE)  5-320 MG tablet   Other Relevant Orders   Basic metabolic panel   Hemoglobin A1c   Depressive disorder    Stable, continues with psychiatry.       Chronic renal failure, stage 3b ([place])    Follows with nephrology.       Relevant Orders   Basic metabolic panel   Other Visit Diagnoses     Needs flu shot    -  Primary   Relevant Orders   Flu Vaccine QUAD 6+ mos PF IM (Fluarix Quad PF)      Meds ordered this encounter  Medications   hydrochlorothiazide (HYDRODIURIL) 25 MG tablet    Sig: Take 1 tablet (25 mg total) by mouth daily.    Dispense:  90 tablet    Refill:  1    Order Specific Question:   Supervising Provider    Answer:   Penni Homans A [[id number]]   carvedilol (COREG) 6.25 MG tablet    Sig: TAKE 1 TABLET BY MOUTH TWICE DAILY WITH A MEAL    Dispense:  180 tablet    Refill:  1    Order Specific Question:   Supervising Provider    Answer:   Penni Homans A [[id number]]   atorvastatin (LIPITOR) 20 MG tablet    Sig: Take 1 tablet (20 mg total) by mouth daily.    Dispense:  90 tablet    Refill:  0    Order Specific Question:   Supervising Provider    Answer:   Penni Homans A [[id number]]   amLODipine-valsartan (EXFORGE) 5-320 MG tablet    Sig: Take 1 tablet by mouth daily.    Dispense:  90 tablet    Refill:  0    Order Specific Question:   Supervising Provider    Answer:   Penni Homans A [[id number]]    I, Nance Pear, NP, personally preformed the services described in this documentation.  All medical record entries made by the scribe were at my direction and in my presence.  I have reviewed the chart and discharge instructions (if applicable) and agree that the record reflects my personal performance and is accurate and complete. 11/10/2021   I,Amber Collins,acting as a scribe for Nance Pear, NP.,have documented all relevant documentation on the behalf of Nance Pear, NP,as directed by  Nance Pear, NP while in the presence of Nance Pear, NP.      Nance Pear, NP

## 2021-11-10 NOTE — Assessment & Plan Note (Signed)
Reports sleep is stable. She uses trazodone HS.

## 2021-11-10 NOTE — Assessment & Plan Note (Signed)
Lab Results  Component Value Date   HGBA1C 5.8 05/09/2021   HGBA1C 5.9 11/07/2020   HGBA1C 5.5 04/29/2020   Lab Results  Component Value Date   MICROALBUR 604 04/25/2021   LDLCALC 48 05/09/2021   CREATININE 1.19 05/09/2021   Managing with diet, exercise, weight control.

## 2021-11-11 LAB — BASIC METABOLIC PANEL
BUN/Creatinine Ratio: 14 (calc) (ref 6–22)
BUN: 24 mg/dL (ref 7–25)
CO2: 24 mmol/L (ref 20–32)
Calcium: 9.7 mg/dL (ref 8.6–10.4)
Chloride: 104 mmol/L (ref 98–110)
Creat: 1.69 mg/dL — ABNORMAL HIGH (ref 0.50–1.05)
Glucose, Bld: 94 mg/dL (ref 65–99)
Potassium: 3.4 mmol/L — ABNORMAL LOW (ref 3.5–5.3)
Sodium: 140 mmol/L (ref 135–146)

## 2021-11-11 LAB — HEMOGLOBIN A1C
Hgb A1c MFr Bld: 5.2 % of total Hgb (ref <5.7)
Mean Plasma Glucose: 103 mg/dL
eAG (mmol/L): 5.7 mmol/L

## 2021-11-13 ENCOUNTER — Telehealth: Payer: Self-pay | Admitting: Family

## 2021-11-13 NOTE — Telephone Encounter (Signed)
Patient called to go over labs and to get a copy of her flu shot to be emailed to health at work. Please advise.

## 2021-11-13 NOTE — Telephone Encounter (Signed)
Please advise pt that her kidney function has decreased since last visit. Is she taking any NSAIDS? If so, please discontinue.  Drink plenty of fluids. Repeat bmet in 1 week, dx renal insufficiency.

## 2021-11-14 ENCOUNTER — Other Ambulatory Visit: Payer: Self-pay

## 2021-11-14 DIAGNOSIS — N289 Disorder of kidney and ureter, unspecified: Secondary | ICD-10-CM

## 2021-11-14 NOTE — Telephone Encounter (Signed)
Pt is aware of lab results and will email flu vaccine.

## 2021-11-20 ENCOUNTER — Other Ambulatory Visit: Payer: Self-pay | Admitting: Family

## 2021-11-20 DIAGNOSIS — Z1231 Encounter for screening mammogram for malignant neoplasm of breast: Secondary | ICD-10-CM

## 2021-11-21 ENCOUNTER — Other Ambulatory Visit (INDEPENDENT_AMBULATORY_CARE_PROVIDER_SITE_OTHER): Payer: 59

## 2021-11-21 DIAGNOSIS — N289 Disorder of kidney and ureter, unspecified: Secondary | ICD-10-CM | POA: Diagnosis not present

## 2021-11-21 LAB — BASIC METABOLIC PANEL
BUN: 13 mg/dL (ref 6–23)
CO2: 27 mEq/L (ref 19–32)
Calcium: 9.9 mg/dL (ref 8.4–10.5)
Chloride: 101 mEq/L (ref 96–112)
Creatinine, Ser: 1.18 mg/dL (ref 0.40–1.20)
GFR: 49.15 mL/min — ABNORMAL LOW (ref >60.00)
Glucose, Bld: 114 mg/dL — ABNORMAL HIGH (ref 70–99)
Potassium: 3.2 mEq/L — ABNORMAL LOW (ref 3.5–5.1)
Sodium: 138 mEq/L (ref 135–145)

## 2021-11-22 ENCOUNTER — Telehealth: Payer: Self-pay | Admitting: Family

## 2021-11-22 ENCOUNTER — Other Ambulatory Visit (HOSPITAL_BASED_OUTPATIENT_CLINIC_OR_DEPARTMENT_OTHER): Payer: Self-pay

## 2021-11-22 DIAGNOSIS — E876 Hypokalemia: Secondary | ICD-10-CM

## 2021-11-22 MED ORDER — POTASSIUM CHLORIDE CRYS ER 20 MEQ PO TBCR
20.0000 meq | EXTENDED_RELEASE_TABLET | Freq: Every day | ORAL | 3 refills | Status: DC
  Filled 2021-11-22: qty 30, 30d supply, fill #0

## 2021-11-22 NOTE — Telephone Encounter (Signed)
Potassium is low. Please start kdur 84mQ once daily. Repeat bmet in 1 week, dx hypokalemia.

## 2021-11-22 NOTE — Telephone Encounter (Signed)
Patient advised of results and new rx. She was scheduled to come back 11/30/21 for bmet

## 2021-11-22 NOTE — Telephone Encounter (Signed)
Opened in error

## 2021-11-27 ENCOUNTER — Other Ambulatory Visit (HOSPITAL_BASED_OUTPATIENT_CLINIC_OR_DEPARTMENT_OTHER): Payer: Self-pay

## 2021-11-28 ENCOUNTER — Other Ambulatory Visit (HOSPITAL_BASED_OUTPATIENT_CLINIC_OR_DEPARTMENT_OTHER): Payer: Self-pay

## 2021-11-29 ENCOUNTER — Other Ambulatory Visit (HOSPITAL_BASED_OUTPATIENT_CLINIC_OR_DEPARTMENT_OTHER): Payer: Self-pay

## 2021-11-30 ENCOUNTER — Other Ambulatory Visit (INDEPENDENT_AMBULATORY_CARE_PROVIDER_SITE_OTHER): Payer: 59

## 2021-11-30 DIAGNOSIS — E876 Hypokalemia: Secondary | ICD-10-CM | POA: Diagnosis not present

## 2021-12-01 LAB — BASIC METABOLIC PANEL
BUN: 13 mg/dL (ref 6–23)
CO2: 25 mEq/L (ref 19–32)
Calcium: 9.7 mg/dL (ref 8.4–10.5)
Chloride: 104 mEq/L (ref 96–112)
Creatinine, Ser: 1.15 mg/dL (ref 0.40–1.20)
GFR: 50.68 mL/min — ABNORMAL LOW (ref >60.00)
Glucose, Bld: 97 mg/dL (ref 70–99)
Potassium: 4 mEq/L (ref 3.5–5.1)
Sodium: 139 mEq/L (ref 135–145)

## 2021-12-05 ENCOUNTER — Other Ambulatory Visit (HOSPITAL_BASED_OUTPATIENT_CLINIC_OR_DEPARTMENT_OTHER): Payer: Self-pay

## 2021-12-05 ENCOUNTER — Telehealth: Payer: 59 | Admitting: Physician Assistant

## 2021-12-05 DIAGNOSIS — R3989 Other symptoms and signs involving the genitourinary system: Secondary | ICD-10-CM | POA: Diagnosis not present

## 2021-12-05 MED ORDER — CEPHALEXIN 500 MG PO CAPS
500.0000 mg | ORAL_CAPSULE | Freq: Two times a day (BID) | ORAL | 0 refills | Status: DC
  Filled 2021-12-05: qty 14, 7d supply, fill #0

## 2021-12-05 NOTE — Progress Notes (Signed)

## 2021-12-14 ENCOUNTER — Other Ambulatory Visit (HOSPITAL_BASED_OUTPATIENT_CLINIC_OR_DEPARTMENT_OTHER): Payer: Self-pay

## 2021-12-20 ENCOUNTER — Ambulatory Visit
Admission: RE | Admit: 2021-12-20 | Discharge: 2021-12-20 | Disposition: A | Discharge status: Home / Self Care | Payer: 59 | Source: Ambulatory Visit | Attending: Family | Admitting: Family

## 2021-12-20 DIAGNOSIS — Z1231 Encounter for screening mammogram for malignant neoplasm of breast: Secondary | ICD-10-CM | POA: Diagnosis not present

## 2021-12-20 LAB — HM MAMMOGRAPHY

## 2021-12-26 ENCOUNTER — Other Ambulatory Visit (HOSPITAL_BASED_OUTPATIENT_CLINIC_OR_DEPARTMENT_OTHER): Payer: Self-pay

## 2021-12-26 DIAGNOSIS — F3181 Bipolar II disorder: Secondary | ICD-10-CM | POA: Diagnosis not present

## 2022-01-05 ENCOUNTER — Other Ambulatory Visit (HOSPITAL_BASED_OUTPATIENT_CLINIC_OR_DEPARTMENT_OTHER): Payer: Self-pay

## 2022-01-10 ENCOUNTER — Other Ambulatory Visit (HOSPITAL_BASED_OUTPATIENT_CLINIC_OR_DEPARTMENT_OTHER): Payer: Self-pay

## 2022-01-29 ENCOUNTER — Telehealth: Payer: 59 | Admitting: Family Medicine

## 2022-01-29 DIAGNOSIS — R319 Hematuria, unspecified: Secondary | ICD-10-CM

## 2022-01-29 NOTE — Progress Notes (Signed)
Blood in urine- UTI in last 3 months  Would benefit from Urine sample collection

## 2022-01-30 ENCOUNTER — Encounter: Payer: Self-pay | Admitting: Family Medicine

## 2022-01-30 ENCOUNTER — Other Ambulatory Visit (HOSPITAL_BASED_OUTPATIENT_CLINIC_OR_DEPARTMENT_OTHER): Payer: Self-pay

## 2022-01-30 ENCOUNTER — Ambulatory Visit (INDEPENDENT_AMBULATORY_CARE_PROVIDER_SITE_OTHER): Payer: 59 | Admitting: Family Medicine

## 2022-01-30 VITALS — BP 120/71 | HR 82 | Temp 97.9°F | Resp 18 | Ht 64.0 in | Wt 145.0 lb

## 2022-01-30 DIAGNOSIS — R319 Hematuria, unspecified: Secondary | ICD-10-CM

## 2022-01-30 DIAGNOSIS — R300 Dysuria: Secondary | ICD-10-CM

## 2022-01-30 LAB — URINALYSIS, MICROSCOPIC ONLY

## 2022-01-30 LAB — POC URINALSYSI DIPSTICK (AUTOMATED)
Glucose, UA: POSITIVE — AB
Ketones, UA: 15
Nitrite, UA: POSITIVE
Protein, UA: POSITIVE — AB
Spec Grav, UA: 1.025 (ref 1.010–1.025)
Urobilinogen, UA: 0.2 E.U./dL
pH, UA: 6 (ref 5.0–8.0)

## 2022-01-30 MED ORDER — CEPHALEXIN 500 MG PO CAPS
500.0000 mg | ORAL_CAPSULE | Freq: Four times a day (QID) | ORAL | 0 refills | Status: AC
  Filled 2022-01-30: qty 20, 5d supply, fill #0

## 2022-01-30 NOTE — Patient Instructions (Signed)
Increase fluid intake to a minimum of 8, 8 ounce glasses of water per day to help flush the kidneys and bladder.   Antibiotic prescribed - if we need to change based on the culture we will let you know  If you begin to have worsening symptoms, low back pain, fevers, chills, nausea, vomiting, diarrhea, or decreased urine, please let us know.  Please finish all of your antibiotic even if you begin to feel better before you have completed the full course. It takes the full dose to be completely effective against the bacteria present. Stopping the antibiotic early can lead to the bacteria becoming resistant to the antibiotic and puts you at risk of the antibiotic not working for you in the future.

## 2022-01-30 NOTE — Progress Notes (Signed)
Acute Office Visit  Subjective:     Patient ID: Kaitlyn Harper, female    DOB: 02-Jul-1960, 61 y.o.   MRN: [id number]  Chief Complaint  Patient presents with   Urinary Tract Infection    Last treated for a UTI in October. This infection started this past Friday. She has taken some Azo and noticed Hematuria.        Urinary Tract Infection: Patient complains of burning with urination, foul smelling urine, frequency, and hematuria She has had symptoms for 4 days. Patient denies back pain, congestion, cough, fever, headache, rhinitis, sorethroat, stomach ache, and vaginal discharge. Patient does not have a history of recurrent UTI.  Patient does not have a history of pyelonephritis.   Reports she was treated for a UTI in October with Keflex and that resolved her symptoms.  She is sexually active, but denies any concerns for STDs No vaginal symptoms. Admits to not staying well hydrated and tending to hold her urine for long periods of time while at work.       ROS All review of systems negative except what is listed in the HPI      Objective:    BP 120/71 (BP Location: Left Arm, Patient Position: Sitting, Cuff Size: Normal)   Pulse 82   Temp 97.9 F (36.6 C) (Oral)   Resp 18   Ht '5\' 4"'$  (1.626 m)   Wt 145 lb (65.8 kg)   LMP 12/25/2008   BMI 24.89 kg/m    Physical Exam Vitals reviewed.  Constitutional:      Appearance: Normal appearance.  Cardiovascular:     Rate and Rhythm: Normal rate and regular rhythm.     Pulses: Normal pulses.     Heart sounds: Normal heart sounds.  Pulmonary:     Effort: Pulmonary effort is normal.     Breath sounds: Normal breath sounds.  Abdominal:     General: Abdomen is flat. Bowel sounds are normal. There is no distension.     Palpations: Abdomen is soft.     Tenderness: There is no abdominal tenderness. There is no right CVA tenderness, left CVA tenderness or guarding.  Skin:    General: Skin is warm and dry.  Neurological:      Mental Status: She is alert and oriented to person, place, and time.  Psychiatric:        Mood and Affect: Mood normal.        Behavior: Behavior normal.        Thought Content: Thought content normal.        Judgment: Judgment normal.      Results for orders placed or performed in visit on 01/30/22  POCT Urinalysis Dipstick (Automated)  Result Value Ref Range   Color, UA orange    Clarity, UA cloudy    Glucose, UA Positive (A) Negative   Bilirubin, UA large    Ketones, UA 15    Spec Grav, UA 1.025 1.010 - 1.025   Blood, UA moderate    pH, UA 6.0 5.0 - 8.0   Protein, UA Positive (A) Negative   Urobilinogen, UA 0.2 0.2 or 1.0 E.U./dL   Nitrite, UA positive    Leukocytes, UA Moderate (2+) (A) Negative        Assessment & Plan:   1. Dysuria 2. Hematuria, unspecified type UA abnormal today and suggestive of UTI. Adding microscopic eval and culture.  Starting Keflex, but will change based on culture if necessary. Discussed supportive measures and  UTI prevention Patient aware of signs/symptoms requiring further/urgent evaluation.    CrCl based on October labs is 52 mL/min - avoid Macrobid Sulfa drug allergy   - Urine Culture - POCT Urinalysis Dipstick (Automated) - Urine Microscopic Only - cephALEXin (KEFLEX) 500 MG capsule; Take 1 capsule (500 mg total) by mouth 4 (four) times daily for 5 days.  Dispense: 20 capsule; Refill: 0      Meds ordered this encounter  Medications   cephALEXin (KEFLEX) 500 MG capsule    Sig: Take 1 capsule (500 mg total) by mouth 4 (four) times daily for 5 days.    Dispense:  20 capsule    Refill:  0    Order Specific Question:   Supervising Provider    Answer:   Penni Homans A [[id number]]    Return if symptoms worsen or fail to improve.  Terrilyn Saver, NP

## 2022-02-01 ENCOUNTER — Other Ambulatory Visit (INDEPENDENT_AMBULATORY_CARE_PROVIDER_SITE_OTHER): Payer: 59

## 2022-02-01 ENCOUNTER — Telehealth: Payer: Self-pay | Admitting: Family

## 2022-02-01 ENCOUNTER — Encounter: Payer: Self-pay | Admitting: Family Medicine

## 2022-02-01 DIAGNOSIS — R319 Hematuria, unspecified: Secondary | ICD-10-CM

## 2022-02-01 DIAGNOSIS — R109 Unspecified abdominal pain: Secondary | ICD-10-CM

## 2022-02-01 DIAGNOSIS — R310 Gross hematuria: Secondary | ICD-10-CM

## 2022-02-01 LAB — URINE CULTURE
MICRO NUMBER:: 14334650
Result:: NO GROWTH
SPECIMEN QUALITY:: ADEQUATE

## 2022-02-01 NOTE — Telephone Encounter (Signed)
Pt called to schedule follow up labs per provider advice. Pt was scheduled for later today (12.21.23) but does need new orders for today's visit. Please Advise.

## 2022-02-02 ENCOUNTER — Ambulatory Visit (HOSPITAL_BASED_OUTPATIENT_CLINIC_OR_DEPARTMENT_OTHER)
Admission: RE | Admit: 2022-02-02 | Discharge: 2022-02-02 | Disposition: A | Discharge status: Home / Self Care | Payer: 59 | Source: Ambulatory Visit | Attending: Family Medicine | Admitting: Family Medicine

## 2022-02-02 DIAGNOSIS — R319 Hematuria, unspecified: Secondary | ICD-10-CM | POA: Insufficient documentation

## 2022-02-02 DIAGNOSIS — N3289 Other specified disorders of bladder: Secondary | ICD-10-CM | POA: Diagnosis not present

## 2022-02-02 DIAGNOSIS — N281 Cyst of kidney, acquired: Secondary | ICD-10-CM | POA: Diagnosis not present

## 2022-02-02 DIAGNOSIS — R109 Unspecified abdominal pain: Secondary | ICD-10-CM | POA: Diagnosis not present

## 2022-02-02 DIAGNOSIS — I700 Atherosclerosis of aorta: Secondary | ICD-10-CM | POA: Diagnosis not present

## 2022-02-02 LAB — URINALYSIS, MICROSCOPIC ONLY

## 2022-02-06 ENCOUNTER — Other Ambulatory Visit (HOSPITAL_BASED_OUTPATIENT_CLINIC_OR_DEPARTMENT_OTHER): Payer: Self-pay

## 2022-02-06 MED ORDER — PHENAZOPYRIDINE HCL 200 MG PO TABS
200.0000 mg | ORAL_TABLET | Freq: Three times a day (TID) | ORAL | 0 refills | Status: DC | PRN
  Filled 2022-02-06: qty 10, 4d supply, fill #0

## 2022-02-06 NOTE — Addendum Note (Signed)
Addended by: Caleen Jobs B on: 02/06/2022 08:06 AM   Modules accepted: Orders

## 2022-02-07 ENCOUNTER — Other Ambulatory Visit (HOSPITAL_BASED_OUTPATIENT_CLINIC_OR_DEPARTMENT_OTHER): Payer: Self-pay

## 2022-02-07 ENCOUNTER — Encounter: Payer: Self-pay | Admitting: Family Medicine

## 2022-02-12 ENCOUNTER — Other Ambulatory Visit (HOSPITAL_BASED_OUTPATIENT_CLINIC_OR_DEPARTMENT_OTHER): Payer: Self-pay

## 2022-02-13 ENCOUNTER — Other Ambulatory Visit (HOSPITAL_BASED_OUTPATIENT_CLINIC_OR_DEPARTMENT_OTHER): Payer: Self-pay

## 2022-03-01 ENCOUNTER — Other Ambulatory Visit (HOSPITAL_BASED_OUTPATIENT_CLINIC_OR_DEPARTMENT_OTHER): Payer: Self-pay

## 2022-03-01 MED ORDER — ARIPIPRAZOLE 10 MG PO TABS
10.0000 mg | ORAL_TABLET | Freq: Every day | ORAL | 1 refills | Status: DC
  Filled 2022-03-01: qty 90, 90d supply, fill #0
  Filled 2022-03-11 – 2022-05-29 (×4): qty 90, 90d supply, fill #1

## 2022-03-01 MED ORDER — LAMOTRIGINE 100 MG PO TABS
100.0000 mg | ORAL_TABLET | Freq: Two times a day (BID) | ORAL | 0 refills | Status: DC
  Filled 2022-03-01 – 2022-04-10 (×2): qty 180, 90d supply, fill #0

## 2022-03-01 MED ORDER — TRAZODONE HCL 100 MG PO TABS
100.0000 mg | ORAL_TABLET | Freq: Every evening | ORAL | 2 refills | Status: DC | PRN
  Filled 2022-03-01: qty 60, 30d supply, fill #0

## 2022-03-12 ENCOUNTER — Other Ambulatory Visit: Payer: Self-pay

## 2022-03-12 ENCOUNTER — Other Ambulatory Visit (HOSPITAL_BASED_OUTPATIENT_CLINIC_OR_DEPARTMENT_OTHER): Payer: Self-pay

## 2022-03-15 ENCOUNTER — Other Ambulatory Visit: Payer: Self-pay

## 2022-03-19 ENCOUNTER — Other Ambulatory Visit (HOSPITAL_BASED_OUTPATIENT_CLINIC_OR_DEPARTMENT_OTHER): Payer: Self-pay

## 2022-03-21 ENCOUNTER — Other Ambulatory Visit (HOSPITAL_BASED_OUTPATIENT_CLINIC_OR_DEPARTMENT_OTHER): Payer: Self-pay

## 2022-04-02 ENCOUNTER — Other Ambulatory Visit (HOSPITAL_BASED_OUTPATIENT_CLINIC_OR_DEPARTMENT_OTHER): Payer: Self-pay

## 2022-04-02 MED ORDER — PREVIDENT 0.2 % MT SOLN
OROMUCOSAL | 5 refills | Status: DC
  Filled 2022-04-02: qty 473, 30d supply, fill #0

## 2022-04-04 ENCOUNTER — Encounter: Payer: Self-pay | Admitting: Gastroenterology

## 2022-04-11 ENCOUNTER — Other Ambulatory Visit: Payer: Self-pay

## 2022-04-11 ENCOUNTER — Other Ambulatory Visit (HOSPITAL_BASED_OUTPATIENT_CLINIC_OR_DEPARTMENT_OTHER): Payer: Self-pay

## 2022-04-12 ENCOUNTER — Other Ambulatory Visit (HOSPITAL_BASED_OUTPATIENT_CLINIC_OR_DEPARTMENT_OTHER): Payer: Self-pay

## 2022-04-25 ENCOUNTER — Encounter: Payer: Self-pay | Admitting: Gastroenterology

## 2022-04-25 DIAGNOSIS — R319 Hematuria, unspecified: Secondary | ICD-10-CM | POA: Diagnosis not present

## 2022-04-25 DIAGNOSIS — D631 Anemia in chronic kidney disease: Secondary | ICD-10-CM | POA: Diagnosis not present

## 2022-04-25 DIAGNOSIS — I129 Hypertensive chronic kidney disease with stage 1 through stage 4 chronic kidney disease, or unspecified chronic kidney disease: Secondary | ICD-10-CM | POA: Diagnosis not present

## 2022-04-25 DIAGNOSIS — N1831 Chronic kidney disease, stage 3a: Secondary | ICD-10-CM | POA: Diagnosis not present

## 2022-04-25 DIAGNOSIS — E1129 Type 2 diabetes mellitus with other diabetic kidney complication: Secondary | ICD-10-CM | POA: Diagnosis not present

## 2022-05-09 ENCOUNTER — Other Ambulatory Visit: Payer: Self-pay | Admitting: Family

## 2022-05-09 ENCOUNTER — Other Ambulatory Visit (HOSPITAL_BASED_OUTPATIENT_CLINIC_OR_DEPARTMENT_OTHER): Payer: Self-pay

## 2022-05-09 MED ORDER — ATORVASTATIN CALCIUM 20 MG PO TABS
20.0000 mg | ORAL_TABLET | Freq: Every day | ORAL | 0 refills | Status: DC
  Filled 2022-05-09 – 2022-05-10 (×2): qty 90, 90d supply, fill #0

## 2022-05-09 MED ORDER — AMLODIPINE BESYLATE-VALSARTAN 5-320 MG PO TABS
1.0000 | ORAL_TABLET | Freq: Every day | ORAL | 0 refills | Status: DC
  Filled 2022-05-09 – 2022-05-10 (×2): qty 90, 90d supply, fill #0

## 2022-05-10 ENCOUNTER — Other Ambulatory Visit: Payer: Self-pay

## 2022-05-10 ENCOUNTER — Other Ambulatory Visit (HOSPITAL_BASED_OUTPATIENT_CLINIC_OR_DEPARTMENT_OTHER): Payer: Self-pay

## 2022-05-10 DIAGNOSIS — H25813 Combined forms of age-related cataract, bilateral: Secondary | ICD-10-CM | POA: Diagnosis not present

## 2022-05-10 DIAGNOSIS — E119 Type 2 diabetes mellitus without complications: Secondary | ICD-10-CM | POA: Diagnosis not present

## 2022-05-10 DIAGNOSIS — H35033 Hypertensive retinopathy, bilateral: Secondary | ICD-10-CM | POA: Diagnosis not present

## 2022-05-10 DIAGNOSIS — H524 Presbyopia: Secondary | ICD-10-CM | POA: Diagnosis not present

## 2022-05-10 LAB — HM DIABETES EYE EXAM

## 2022-05-11 ENCOUNTER — Ambulatory Visit: Payer: 59 | Admitting: Family

## 2022-05-18 ENCOUNTER — Ambulatory Visit (AMBULATORY_SURGERY_CENTER): Payer: Commercial Managed Care - PPO

## 2022-05-18 ENCOUNTER — Other Ambulatory Visit (HOSPITAL_BASED_OUTPATIENT_CLINIC_OR_DEPARTMENT_OTHER): Payer: Self-pay

## 2022-05-18 VITALS — Ht 64.0 in | Wt 150.0 lb

## 2022-05-18 DIAGNOSIS — Z6829 Body mass index (BMI) 29.0-29.9, adult: Secondary | ICD-10-CM | POA: Insufficient documentation

## 2022-05-18 DIAGNOSIS — Z86010 Personal history of colonic polyps: Secondary | ICD-10-CM

## 2022-05-18 MED ORDER — NA SULFATE-K SULFATE-MG SULF 17.5-3.13-1.6 GM/177ML PO SOLN
1.0000 | Freq: Once | ORAL | 0 refills | Status: AC
  Filled 2022-05-18: qty 354, 1d supply, fill #0

## 2022-05-18 NOTE — Progress Notes (Signed)
No egg or soy allergy known to patient  No issues known to pt with past sedation with any surgeries or procedures Patient denies ever being told they had issues or difficulty with intubation  No FH of Malignant Hyperthermia Pt is not on diet pills Pt is not on  home 02  Pt is not on blood thinners  Pt denies issues with constipation  No A fib or A flutter Have any cardiac testing pending--no Pt instructed to use Singlecare.com or GoodRx for a price reduction on prep   

## 2022-05-21 ENCOUNTER — Ambulatory Visit: Payer: Commercial Managed Care - PPO | Admitting: Family

## 2022-05-21 VITALS — BP 106/60 | HR 74 | Temp 97.8°F | Resp 16 | Wt 156.0 lb

## 2022-05-21 DIAGNOSIS — E1122 Type 2 diabetes mellitus with diabetic chronic kidney disease: Secondary | ICD-10-CM | POA: Diagnosis not present

## 2022-05-21 DIAGNOSIS — N183 Chronic kidney disease, stage 3 unspecified: Secondary | ICD-10-CM | POA: Diagnosis not present

## 2022-05-21 DIAGNOSIS — J302 Other seasonal allergic rhinitis: Secondary | ICD-10-CM

## 2022-05-21 DIAGNOSIS — I10 Essential (primary) hypertension: Secondary | ICD-10-CM | POA: Diagnosis not present

## 2022-05-21 DIAGNOSIS — Z1211 Encounter for screening for malignant neoplasm of colon: Secondary | ICD-10-CM

## 2022-05-21 DIAGNOSIS — F32A Depression, unspecified: Secondary | ICD-10-CM

## 2022-05-21 LAB — COMPREHENSIVE METABOLIC PANEL
ALT: 21 U/L (ref 0–35)
AST: 15 U/L (ref 0–37)
Albumin: 4.4 g/dL (ref 3.5–5.2)
Alkaline Phosphatase: 80 U/L (ref 39–117)
BUN: 15 mg/dL (ref 6–23)
CO2: 27 mEq/L (ref 19–32)
Calcium: 9.5 mg/dL (ref 8.4–10.5)
Chloride: 104 mEq/L (ref 96–112)
Creatinine, Ser: 1.34 mg/dL — ABNORMAL HIGH (ref 0.40–1.20)
GFR: 42.04 mL/min — ABNORMAL LOW (ref >60.00)
Glucose, Bld: 105 mg/dL — ABNORMAL HIGH (ref 70–99)
Potassium: 3.8 mEq/L (ref 3.5–5.1)
Sodium: 138 mEq/L (ref 135–145)
Total Bilirubin: 0.4 mg/dL (ref 0.2–1.2)
Total Protein: 7.2 g/dL (ref 6.0–8.3)

## 2022-05-21 LAB — MICROALBUMIN / CREATININE URINE RATIO
Creatinine,U: 199.6 mg/dL
Microalb Creat Ratio: 0.9 mg/g (ref 0.0–30.0)
Microalb, Ur: 1.7 mg/dL (ref 0.0–1.9)

## 2022-05-21 LAB — HEMOGLOBIN A1C: Hgb A1c MFr Bld: 5.6 % (ref 4.6–6.5)

## 2022-05-21 NOTE — Assessment & Plan Note (Signed)
Stable.  Monitor.  

## 2022-05-21 NOTE — Assessment & Plan Note (Signed)
Appears overtreated. D/c hctz- she will send [place] some updated readings from home later this week.

## 2022-05-21 NOTE — Assessment & Plan Note (Signed)
Reports stable mood- management per psych.

## 2022-05-21 NOTE — Progress Notes (Signed)
Subjective:   By signing my name below, I, Barrett Shell, attest that this documentation has been prepared under the direction and in the presence of Sandford Craze, NP. 05/21/2022   Patient ID: Kaitlyn Harper, female    DOB: 11-Jul-1960, 62 y.o.   MRN: [id number]  Chief Complaint  Patient presents with   Hypertension    Here for follow up   Diabetes    Here for follow up    HPI Patient is in today for a follow-up appointment.   Blood pressure: Her blood pressure is stable during this visit. She is taking 320 mg amlodipine-valsartan daily PO, 25 mg hydrochlorothiazide daily PO, and 6.25 mg carvedilol daily PO to manage it. BP Readings from Last 3 Encounters:  05/21/22 106/60  01/30/22 120/71  11/10/21 139/82   Pulse Readings from Last 3 Encounters:  05/21/22 74  01/30/22 82  11/10/21 77    Mood: Her mood has been stable. She is followed by psychiatry.   Colonoscopy: Last completed on 03/01/2015. Two 3-5 mm polyps found in the descending and ascending colon, otherwise results are normal. Repeat in 5 years.   Pap smear: Last pap smear completed on 09/18/2018. Results are normal. Repeat in 3 years.   Past Medical History:  Diagnosis Date   Anemia    resolved    Anxiety    Bipolar disorder    Chronic kidney disease stage 3    Depression    Diabetes mellitus without complication    type 2-diet controlled   Diverticulitis    History of diverticulitis 12/17/2018   Hyperglycemia    Hyperlipidemia    Hypertension    NSTEMI (non-ST elevated myocardial infarction) 2018   reports she fainted at his sons house and they took her to ED  , troponin was elevated and cardiac cath was completed,  no stent placed nor hx of stent intervention , she reports no casue was ever determined for the sycope episode    Syncope 03/18/2016   reports no recurrence     Past Surgical History:  Procedure Laterality Date   APPENDECTOMY  1985   BREAST BIOPSY Right 01/13/2014   benign    CHOLECYSTECTOMY  1985   COLONOSCOPY W/ POLYPECTOMY  03/01/2015   Dr Wendall Papa, [facility].  +adenomatous polyps   COLONOSCOPY W/ POLYPECTOMY  02/28/2011   4 polyps - 3 TA   DILATION AND CURETTAGE OF UTERUS  2005   HEMORRHOID SURGERY  2002   KNEE SURGERY  1983   arthroscopy?   LEFT HEART CATH AND CORONARY ANGIOGRAPHY N/A 03/19/2016   Procedure: Left Heart Cath and Coronary Angiography;  Surgeon: Peter M Swaziland, MD;  Location: [facility] INVASIVE CV LAB;  Service: Cardiovascular;  Laterality: N/A;   MOUTH SURGERY  03/08/2016   POLYPECTOMY  2006   ?anal polyp?   TEAR DUCT PROBING  2004   TUBAL LIGATION  1985    Family History  Problem Relation Age of Onset   Hypertension Father    Sarcoidosis Brother    Alcohol abuse Brother    Cirrhosis Brother    Stroke Maternal Grandmother    Colon cancer Neg Hx    Colon polyps Neg Hx    Esophageal cancer Neg Hx    Rectal cancer Neg Hx    Stomach cancer Neg Hx    Pulmonary embolism Neg Hx     Social History   Socioeconomic History   Marital status: Single    Spouse name: Not on  file   Number of children: 3   Years of education: Not on file   Highest education level: Some college, no degree  Occupational History   Occupation: Armed forces operational officer: [employer]  Tobacco Use   Smoking status: Every Day    Packs/day: .5    Types: Cigarettes    Last attempt to quit: 07/02/2015    Years since quitting: 6.8   Smokeless tobacco: Never   Tobacco comments:    10 cigarettes daily; 10-30 reports " one to two cigarettes every now and again"   Vaping Use   Vaping Use: Never used  Substance and Sexual Activity   Alcohol use: Yes    Alcohol/week: 0.0 standard drinks of alcohol    Comment: socially   Drug use: No   Sexual activity: Yes    Partners: Male    Birth control/protection: Post-menopausal  Other Topics Concern   Not on file  Social History Narrative   Caffeine use:  2 drinks   Regular exercise: no   Lives with her daughter    3 children    2 grandchildren   1 great grandson   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  (05/18/2022)   Overall Financial Resource Strain (CARDIA)    Difficulty of Paying Living Expenses: Not very hard  Food Insecurity: Food Insecurity Present (05/18/2022)   Hunger Vital Sign    Worried About Running Out of Food in the Last Year: Sometimes true    Ran Out of Food in the Last Year: Never true  Transportation Needs: No Transportation Needs (05/18/2022)   PRAPARE - Administrator, Civil Service (Medical): No    Lack of Transportation (Non-Medical): No  Physical Activity: Insufficiently Active (05/18/2022)   Exercise Vital Sign    Days of Exercise per Week: 2 days    Minutes of Exercise per Session: 60 min  Stress: No Stress Concern Present (05/18/2022)   [employer] of Occupational Health - Occupational Stress Questionnaire    Feeling of Stress : Not at all  Social Connections: Moderately Integrated (05/18/2022)   Social Connection and Isolation Panel [NHANES]    Frequency of Communication with Friends and Family: More than three times a week    Frequency of Social Gatherings with Friends and Family: Once a week    Attends Religious Services: 1 to 4 times per year    Active Member of [employer] or Organizations: Yes    Attends Banker Meetings: 1 to 4 times per year    Marital Status: Divorced  Catering manager Violence: Not on file    Outpatient Medications Prior to Visit  Medication Sig Dispense Refill   amLODipine-valsartan (EXFORGE) 5-320 MG tablet Take 1 tablet by mouth daily. 90 tablet 0   ARIPiprazole (ABILIFY) 10 MG tablet Take 1 tablet (10 mg total) by mouth daily. 90 tablet 1   aspirin EC 81 MG tablet Take 81 mg by mouth daily.     atorvastatin (LIPITOR) 20 MG tablet Take 1 tablet (20 mg total) by mouth daily. 90 tablet 0   carvedilol (COREG) 6.25 MG tablet Take 1 tablet (6.25 mg  total) by mouth 2 (two) times daily with a meal. 180 tablet 1   lamoTRIgine (LAMICTAL) 100 MG tablet Take 1 tablet (100 mg total) by mouth in the morning and at bedtime. 180 tablet 0   phenazopyridine (PYRIDIUM) 200  MG tablet Take 1 tablet (200 mg total) by mouth 3 (three) times daily as needed for pain. 10 tablet 0   psyllium (METAMUCIL) 58.6 % powder Take 1 packet by mouth 3 (three) times daily.     SODIUM FLUORIDE, DENTAL RINSE, (PREVIDENT) 0.2 % SOLN USE AS AN ORAL RINSE, AS DIRECTED ON PACKAGE. 473 mL 5   traZODone (DESYREL) 100 MG tablet TAKE 1-2 TABLETS BY MOUTH AT BEDTIME AS NEEDED FOR SLEEP 60 tablet 5   traZODone (DESYREL) 100 MG tablet Take 1-2 tablets (100-200 mg total) by mouth at bedtime as needed for sleep. 60 tablet 2   hydrochlorothiazide (HYDRODIURIL) 25 MG tablet Take 1 tablet (25 mg total) by mouth daily. 90 tablet 1   ARIPiprazole (ABILIFY) 10 MG tablet Take 1 tablet by mouth once a day 90 tablet 1   potassium chloride SA (KLOR-CON M) 20 MEQ tablet Take 1 tablet (20 mEq total) by mouth daily. 30 tablet 3   SODIUM FLUORIDE, DENTAL RINSE, (PREVIDENT) 0.2 % SOLN USE AS AN ORAL RINSE, AS DIRECTED ON PACKAGE 473 mL 5   No facility-administered medications prior to visit.    Allergies  Allergen Reactions   Sulfonamide Derivatives Hives and Other (See Comments)    Light sensitivity    ROS See HPI    Objective:    Physical Exam Constitutional:      General: She is not in acute distress.    Appearance: Normal appearance.  HENT:     Head: Normocephalic and atraumatic.     Right Ear: External ear normal.     Left Ear: External ear normal.  Eyes:     Extraocular Movements: Extraocular movements intact.     Pupils: Pupils are equal, round, and reactive to light.  Cardiovascular:     Rate and Rhythm: Normal rate and regular rhythm.     Heart sounds: Normal heart sounds. No murmur heard.    No gallop.  Pulmonary:     Effort: Pulmonary effort is normal. No respiratory  distress.     Breath sounds: Normal breath sounds. No wheezing or rales.  Skin:    General: Skin is warm.  Neurological:     Mental Status: She is alert and oriented to person, place, and time.  Psychiatric:        Judgment: Judgment normal.     BP 106/60 (BP Location: Right Arm, Patient Position: Sitting, Cuff Size: Small)   Pulse 74   Temp 97.8 F (36.6 C) (Oral)   Resp 16   Wt 156 lb (70.8 kg)   LMP 12/25/2008   SpO2 100%   BMI 26.78 kg/m  Wt Readings from Last 3 Encounters:  05/21/22 156 lb (70.8 kg)  05/18/22 150 lb (68 kg)  01/30/22 145 lb (65.8 kg)       Assessment & Plan:  Essential hypertension Assessment & Plan: Appears overtreated. D/c hctz- she will send [place] some updated readings from home later this week.    Seasonal allergies Assessment & Plan: Stable. Monitor.    Controlled type 2 diabetes mellitus with stage 3 chronic kidney disease, without long-term current use of insulin Assessment & Plan: Lab Results  Component Value Date   HGBA1C 5.2 11/10/2021   HGBA1C 5.8 05/09/2021   HGBA1C 5.9 11/07/2020   Lab Results  Component Value Date   MICROALBUR 604 04/25/2021   LDLCALC 48 05/09/2021   CREATININE 1.15 11/30/2021   Sugars have been great since she lost weight.   Orders: -  Hemoglobin A1c -     Microalbumin / creatinine urine ratio -     Comprehensive metabolic panel  Screening for colon cancer -     Ambulatory referral to Gastroenterology  Depressive disorder Assessment & Plan: Reports stable mood- management per psych.      I, Lemont FillersMelissa S O'Sullivan, NP, personally preformed the services described in this documentation.  All medical record entries made by the scribe were at my direction and in my presence.  I have reviewed the chart and discharge instructions (if applicable) and agree that the record reflects my personal performance and is accurate and complete. 05/21/2022  Lemont FillersMelissa S O'Sullivan, NP   [facility],Kennedy C Lynn,acting as a  scribe for Lemont FillersMelissa S O'Sullivan, NP.,have documented all relevant documentation on the behalf of Lemont FillersMelissa S O'Sullivan, NP,as directed by  Lemont FillersMelissa S O'Sullivan, NP while in the presence of Lemont FillersMelissa S O'Sullivan, NP.

## 2022-05-21 NOTE — Assessment & Plan Note (Addendum)
Lab Results  Component Value Date   HGBA1C 5.2 11/10/2021   HGBA1C 5.8 05/09/2021   HGBA1C 5.9 11/07/2020   Lab Results  Component Value Date   MICROALBUR 604 04/25/2021   LDLCALC 48 05/09/2021   CREATININE 1.15 11/30/2021   Sugars have been great since she lost weight.

## 2022-05-29 ENCOUNTER — Encounter: Payer: Self-pay | Admitting: Family

## 2022-05-29 MED ORDER — CARVEDILOL 6.25 MG PO TABS: 3.1250 mg | ORAL_TABLET | Freq: Two times a day (BID) | ORAL | 1 refills | Status: DC

## 2022-05-30 ENCOUNTER — Encounter: Payer: Self-pay | Admitting: Gastroenterology

## 2022-06-04 ENCOUNTER — Other Ambulatory Visit (HOSPITAL_BASED_OUTPATIENT_CLINIC_OR_DEPARTMENT_OTHER): Payer: Self-pay

## 2022-06-13 ENCOUNTER — Encounter: Payer: Self-pay | Admitting: Gastroenterology

## 2022-06-13 ENCOUNTER — Ambulatory Visit (AMBULATORY_SURGERY_CENTER): Payer: Commercial Managed Care - PPO | Admitting: Gastroenterology

## 2022-06-13 VITALS — BP 134/72 | HR 60 | Temp 97.1°F | Resp 9 | Ht 64.0 in | Wt 150.0 lb

## 2022-06-13 DIAGNOSIS — Z1211 Encounter for screening for malignant neoplasm of colon: Secondary | ICD-10-CM | POA: Diagnosis not present

## 2022-06-13 DIAGNOSIS — N183 Chronic kidney disease, stage 3 unspecified: Secondary | ICD-10-CM | POA: Diagnosis not present

## 2022-06-13 DIAGNOSIS — Z86010 Personal history of colonic polyps: Secondary | ICD-10-CM

## 2022-06-13 DIAGNOSIS — D123 Benign neoplasm of transverse colon: Secondary | ICD-10-CM | POA: Diagnosis not present

## 2022-06-13 DIAGNOSIS — F319 Bipolar disorder, unspecified: Secondary | ICD-10-CM | POA: Diagnosis not present

## 2022-06-13 DIAGNOSIS — D127 Benign neoplasm of rectosigmoid junction: Secondary | ICD-10-CM | POA: Diagnosis not present

## 2022-06-13 DIAGNOSIS — D128 Benign neoplasm of rectum: Secondary | ICD-10-CM

## 2022-06-13 DIAGNOSIS — F419 Anxiety disorder, unspecified: Secondary | ICD-10-CM | POA: Diagnosis not present

## 2022-06-13 DIAGNOSIS — Z09 Encounter for follow-up examination after completed treatment for conditions other than malignant neoplasm: Secondary | ICD-10-CM | POA: Diagnosis not present

## 2022-06-13 DIAGNOSIS — D125 Benign neoplasm of sigmoid colon: Secondary | ICD-10-CM | POA: Diagnosis not present

## 2022-06-13 DIAGNOSIS — K635 Polyp of colon: Secondary | ICD-10-CM | POA: Diagnosis not present

## 2022-06-13 DIAGNOSIS — I10 Essential (primary) hypertension: Secondary | ICD-10-CM | POA: Diagnosis not present

## 2022-06-13 MED ORDER — SODIUM CHLORIDE 0.9 % IV SOLN: 500.0000 mL | Freq: Once | INTRAVENOUS | Status: DC

## 2022-06-13 NOTE — Patient Instructions (Signed)
Follow a high fiber diet. Resume previous medications. Awaiting pathology results. Repeat Colonoscopy date to be determined based on pathology results. Handouts provided on: Colon polyps, High fiber diet and Hemorrhoids.  YOU HAD AN ENDOSCOPIC PROCEDURE TODAY AT THE [facility]:   Refer to the procedure report that was given to you for any specific questions about what was found during the examination.  If the procedure report does not answer your questions, please call your gastroenterologist to clarify.  If you requested that your care partner not be given the details of your procedure findings, then the procedure report has been included in a sealed envelope for you to review at your convenience later.  YOU SHOULD EXPECT: Some feelings of bloating in the abdomen. Passage of more gas than usual.  Walking can help get rid of the air that was put into your GI tract during the procedure and reduce the bloating. If you had a lower endoscopy (such as a colonoscopy or flexible sigmoidoscopy) you may notice spotting of blood in your stool or on the toilet paper. If you underwent a bowel prep for your procedure, you may not have a normal bowel movement for a few days.  Please Note:  You might notice some irritation and congestion in your nose or some drainage.  This is from the oxygen used during your procedure.  There is no need for concern and it should clear up in a day or so.  SYMPTOMS TO REPORT IMMEDIATELY:  Following lower endoscopy (colonoscopy or flexible sigmoidoscopy):  Excessive amounts of blood in the stool  Significant tenderness or worsening of abdominal pains  Swelling of the abdomen that is new, acute  Fever of 100F or higher  For urgent or emergent issues, a gastroenterologist can be reached at any hour by calling [phone]. Do not use MyChart messaging for urgent concerns.    DIET:  We do recommend a small meal at first, but then you may proceed to your regular  diet.  Drink plenty of fluids but you should avoid alcoholic beverages for 24 hours.  ACTIVITY:  You should plan to take it easy for the rest of today and you should NOT DRIVE or use heavy machinery until tomorrow (because of the sedation medicines used during the test).    FOLLOW UP: Our staff will call the number listed on your records the next business day following your procedure.  We will call around 7:15- 8:00 am to check on you and address any questions or concerns that you may have regarding the information given to you following your procedure. If we do not reach you, we will leave a message.     If any biopsies were taken you will be contacted by phone or by letter within the next 1-3 weeks.  Please call us at [phone] if you have not heard about the biopsies in 3 weeks.    SIGNATURES/CONFIDENTIALITY: You and/or your care partner have signed paperwork which will be entered into your electronic medical record.  These signatures attest to the fact that that the information above on your After Visit Summary has been reviewed and is understood.  Full responsibility of the confidentiality of this discharge information lies with you and/or your care-partner.

## 2022-06-13 NOTE — Progress Notes (Unsigned)
Called to room to assist during endoscopic procedure.  Patient ID and intended procedure confirmed with present staff. Received instructions for my participation in the procedure from the performing physician.  

## 2022-06-13 NOTE — Progress Notes (Unsigned)
GASTROENTEROLOGY PROCEDURE H&P NOTE   Primary Care Physician: Sandford Craze, NP  HPI: Kaitlyn Harper is a 62 y.o. female who presents for Colonoscopy for surveillance.  Past Medical History:  Diagnosis Date   Anemia    resolved    Anxiety    Bipolar disorder (HCC)    Chronic kidney disease stage 3    Depression    Diabetes mellitus without complication (HCC)    type 2-diet controlled   Diverticulitis    History of diverticulitis 12/17/2018   Hyperglycemia    Hyperlipidemia    Hypertension    NSTEMI (non-ST elevated myocardial infarction) (HCC) 2018   reports she fainted at his sons house and they took her to ED  , troponin was elevated and cardiac cath was completed,  no stent placed nor hx of stent intervention , she reports no casue was ever determined for the sycope episode    Syncope 03/18/2016   reports no recurrence    Past Surgical History:  Procedure Laterality Date   APPENDECTOMY  1985   BREAST BIOPSY Right 01/13/2014   benign   CHOLECYSTECTOMY  1985   COLONOSCOPY W/ POLYPECTOMY  03/01/2015   Dr Wendall Papa, [facility].  +adenomatous polyps   COLONOSCOPY W/ POLYPECTOMY  02/28/2011   4 polyps - 3 TA   DILATION AND CURETTAGE OF UTERUS  2005   HEMORRHOID SURGERY  2002   KNEE SURGERY  1983   arthroscopy?   LEFT HEART CATH AND CORONARY ANGIOGRAPHY N/A 03/19/2016   Procedure: Left Heart Cath and Coronary Angiography;  Surgeon: Peter M Swaziland, MD;  Location: [facility] INVASIVE CV LAB;  Service: Cardiovascular;  Laterality: N/A;   MOUTH SURGERY  03/08/2016   POLYPECTOMY  2006   ?anal polyp?   TEAR DUCT PROBING  2004   TUBAL LIGATION  1985   Current Outpatient Medications  Medication Sig Dispense Refill   amLODipine-valsartan (EXFORGE) 5-320 MG tablet Take 1 tablet by mouth daily. 90 tablet 0   ARIPiprazole (ABILIFY) 10 MG tablet Take 1 tablet (10 mg total) by mouth daily. 90 tablet 1   aspirin EC 81 MG tablet Take 81 mg by mouth daily.     atorvastatin  (LIPITOR) 20 MG tablet Take 1 tablet (20 mg total) by mouth daily. 90 tablet 0   carvedilol (COREG) 6.25 MG tablet Take 0.5 tablets (3.125 mg total) by mouth 2 (two) times daily with a meal. 180 tablet 1   lamoTRIgine (LAMICTAL) 100 MG tablet Take 1 tablet (100 mg total) by mouth in the morning and at bedtime. 180 tablet 0   traZODone (DESYREL) 100 MG tablet TAKE 1-2 TABLETS BY MOUTH AT BEDTIME AS NEEDED FOR SLEEP 60 tablet 5   phenazopyridine (PYRIDIUM) 200 MG tablet Take 1 tablet (200 mg total) by mouth 3 (three) times daily as needed for pain. 10 tablet 0   psyllium (METAMUCIL) 58.6 % powder Take 1 packet by mouth 3 (three) times daily.     SODIUM FLUORIDE, DENTAL RINSE, (PREVIDENT) 0.2 % SOLN USE AS AN ORAL RINSE, AS DIRECTED ON PACKAGE. 473 mL 5   traZODone (DESYREL) 100 MG tablet Take 1-2 tablets (100-200 mg total) by mouth at bedtime as needed for sleep. 60 tablet 2   Current Facility-Administered Medications  Medication Dose Route Frequency Provider Last Rate Last Admin   0.9 %  sodium chloride infusion  500 mL Intravenous Once Mansouraty, Netty Starring., MD        Current Outpatient Medications:  amLODipine-valsartan (EXFORGE) 5-320 MG tablet, Take 1 tablet by mouth daily., Disp: 90 tablet, Rfl: 0   ARIPiprazole (ABILIFY) 10 MG tablet, Take 1 tablet (10 mg total) by mouth daily., Disp: 90 tablet, Rfl: 1   aspirin EC 81 MG tablet, Take 81 mg by mouth daily., Disp: , Rfl:    atorvastatin (LIPITOR) 20 MG tablet, Take 1 tablet (20 mg total) by mouth daily., Disp: 90 tablet, Rfl: 0   carvedilol (COREG) 6.25 MG tablet, Take 0.5 tablets (3.125 mg total) by mouth 2 (two) times daily with a meal., Disp: 180 tablet, Rfl: 1   lamoTRIgine (LAMICTAL) 100 MG tablet, Take 1 tablet (100 mg total) by mouth in the morning and at bedtime., Disp: 180 tablet, Rfl: 0   traZODone (DESYREL) 100 MG tablet, TAKE 1-2 TABLETS BY MOUTH AT BEDTIME AS NEEDED FOR SLEEP, Disp: 60 tablet, Rfl: 5   phenazopyridine  (PYRIDIUM) 200 MG tablet, Take 1 tablet (200 mg total) by mouth 3 (three) times daily as needed for pain., Disp: 10 tablet, Rfl: 0   psyllium (METAMUCIL) 58.6 % powder, Take 1 packet by mouth 3 (three) times daily., Disp: , Rfl:    SODIUM FLUORIDE, DENTAL RINSE, (PREVIDENT) 0.2 % SOLN, USE AS AN ORAL RINSE, AS DIRECTED ON PACKAGE., Disp: 473 mL, Rfl: 5   traZODone (DESYREL) 100 MG tablet, Take 1-2 tablets (100-200 mg total) by mouth at bedtime as needed for sleep., Disp: 60 tablet, Rfl: 2  Current Facility-Administered Medications:    0.9 %  sodium chloride infusion, 500 mL, Intravenous, Once, Mansouraty, Netty Starring., MD Allergies  Allergen Reactions   Sulfonamide Derivatives Hives and Other (See Comments)    Light sensitivity   Family History  Problem Relation Age of Onset   Hypertension Father    Sarcoidosis Brother    Alcohol abuse Brother    Cirrhosis Brother    Stroke Maternal Grandmother    Colon cancer Neg Hx    Colon polyps Neg Hx    Esophageal cancer Neg Hx    Rectal cancer Neg Hx    Stomach cancer Neg Hx    Pulmonary embolism Neg Hx    Social History   Socioeconomic History   Marital status: Single    Spouse name: Not on file   Number of children: 3   Years of education: Not on file   Highest education level: Some college, no degree  Occupational History   Occupation: Armed forces operational officer: [employer]  Tobacco Use   Smoking status: Every Day    Packs/day: .5    Types: Cigarettes    Last attempt to quit: 07/02/2015    Years since quitting: 6.9   Smokeless tobacco: Never   Tobacco comments:    10 cigarettes daily; 10-30 reports " one to two cigarettes every now and again"   Vaping Use   Vaping Use: Never used  Substance and Sexual Activity   Alcohol use: Yes    Alcohol/week: 0.0 standard drinks of alcohol    Comment: socially   Drug use: No   Sexual activity: Yes    Partners: Male    Birth control/protection: Post-menopausal  Other Topics  Concern   Not on file  Social History Narrative   Caffeine use:  2 drinks   Regular exercise: no   Lives with her daughter   3 children    2 grandchildren   1 great grandson   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[employer]  Social Determinants of Health   Financial Resource Strain: Low Risk  (05/18/2022)   Overall Financial Resource Strain (CARDIA)    Difficulty of Paying Living Expenses: Not very hard  Food Insecurity: Food Insecurity Present (05/18/2022)   Hunger Vital Sign    Worried About Running Out of Food in the Last Year: Sometimes true    Ran Out of Food in the Last Year: Never true  Transportation Needs: No Transportation Needs (05/18/2022)   PRAPARE - Administrator, Civil Service (Medical): No    Lack of Transportation (Non-Medical): No  Physical Activity: Insufficiently Active (05/18/2022)   Exercise Vital Sign    Days of Exercise per Week: 2 days    Minutes of Exercise per Session: 60 min  Stress: No Stress Concern Present (05/18/2022)   [employer] of Occupational Health - Occupational Stress Questionnaire    Feeling of Stress : Not at all  Social Connections: Moderately Integrated (05/18/2022)   Social Connection and Isolation Panel [NHANES]    Frequency of Communication with Friends and Family: More than three times a week    Frequency of Social Gatherings with Friends and Family: Once a week    Attends Religious Services: 1 to 4 times per year    Active Member of [employer] or Organizations: Yes    Attends Banker Meetings: 1 to 4 times per year    Marital Status: Divorced  Catering manager Violence: Not on file    Physical Exam: Today's Vitals   06/13/22 1037  BP: 136/82  Pulse: 73  Temp: (!) 97.1 F (36.2 C)  SpO2: 100%  Weight: 150 lb (68 kg)  Height: 5\' 4"  (1.626 m)   Body mass index is 25.75 kg/m. GEN: NAD EYE: Sclerae anicteric ENT: MMM CV: Non-tachycardic GI: Soft, NT/ND NEURO:  Alert & Oriented x 3  Lab Results: No results  for input(s): "WBC", "HGB", "HCT", "PLT" in the last 72 hours. BMET No results for input(s): "NA", "K", "CL", "CO2", "GLUCOSE", "BUN", "CREATININE", "CALCIUM" in the last 72 hours. LFT No results for input(s): "PROT", "ALBUMIN", "AST", "ALT", "ALKPHOS", "BILITOT", "BILIDIR", "IBILI" in the last 72 hours. PT/INR No results for input(s): "LABPROT", "INR" in the last 72 hours.   Impression / Plan: This is a 62 y.o.female who presents for Colonoscopy for surveillance.  The risks and benefits of endoscopic evaluation/treatment were discussed with the patient and/or family; these include but are not limited to the risk of perforation, infection, bleeding, missed lesions, lack of diagnosis, severe illness requiring hospitalization, as well as anesthesia and sedation related illnesses.  The patient's history has been reviewed, patient examined, no change in status, and deemed stable for procedure.  The patient and/or family is agreeable to proceed.    Corliss Parish, MD  Gastroenterology Advanced Endoscopy Office # [phone]

## 2022-06-13 NOTE — Progress Notes (Unsigned)
Uneventful anesthetic. Report to pacu rn. Vss. Care resumed by rn. 

## 2022-06-13 NOTE — Op Note (Signed)
[facility] Patient Name: Kaitlyn Harper Procedure Date: 06/13/2022 10:56 AM MRN: [id number] Endoscopist: Corliss Parish , MD, [id number] Age: 62 Referring MD:  Date of Birth: 23-Mar-1960 Gender: Female Account #: [id number] Procedure:                Colonoscopy Indications:              Surveillance: Personal history of adenomatous                            polyps on last colonoscopy > 5 years ago Medicines:                Monitored Anesthesia Care Procedure:                Pre-Anesthesia Assessment:                           - Prior to the procedure, a History and Physical                            was performed, and patient medications and                            allergies were reviewed. The patient's tolerance of                            previous anesthesia was also reviewed. The risks                            and benefits of the procedure and the sedation                            options and risks were discussed with the patient.                            All questions were answered, and informed consent                            was obtained. Prior Anticoagulants: The patient has                            taken no anticoagulant or antiplatelet agents                            except for aspirin. ASA Grade Assessment: II - A                            patient with mild systemic disease. After reviewing                            the risks and benefits, the patient was deemed in                            satisfactory condition to undergo the procedure.  After obtaining informed consent, the colonoscope                            was passed under direct vision. Throughout the                            procedure, the patient's blood pressure, pulse, and                            oxygen saturations were monitored continuously. The                            Olympus CF-HQ190L ([id number]) Colonoscope was                             introduced through the anus and advanced to the 3                            cm into the ileum. The colonoscopy was performed                            without difficulty. The patient tolerated the                            procedure. The quality of the bowel preparation was                            adequate. The terminal ileum, ileocecal valve,                            appendiceal orifice, and rectum were photographed. Scope In: 11:10:05 AM Scope Out: 11:31:20 AM Scope Withdrawal Time: 0 hours 17 minutes 5 seconds  Total Procedure Duration: 0 hours 21 minutes 15 seconds  Findings:                 The digital rectal exam findings include                            hemorrhoids. Pertinent negatives include no                            palpable rectal lesions.                           There was evidence of a prior end-to-end                            colo-colonic anastomosis in the proximal rectum                            (approximately 11 to 12 cm). This was patent and                            was characterized by healthy appearing mucosa and  visible suture. The anastomosis was traversed.                            Removal of the suture was accomplished with a                            regular forceps.                           Ten sessile polyps were found in the rectum (2),                            sigmoid colon (2), splenic flexure (1) and                            transverse colon (5). The polyps were 2 to 6 mm in                            size. These polyps were removed with a cold snare.                            Resection and retrieval were complete.                           Normal mucosa was found in the entire colon                            otherwise.                           Non-bleeding non-thrombosed external and internal                            hemorrhoids were found during retroflexion, during                             perianal exam and during digital exam. The                            hemorrhoids were Grade II (internal hemorrhoids                            that prolapse but reduce spontaneously). Complications:            No immediate complications. Estimated Blood Loss:     Estimated blood loss was minimal. Impression:               - Hemorrhoids found on digital rectal exam.                           - Patent end-to-end colo-colonic anastomosis,                            characterized by healthy appearing mucosa and  visible sutures.                           - Ten, 2 to 6 mm polyps in the rectum, in the                            sigmoid colon, at the splenic flexure and in the                            transverse colon, removed with a cold snare.                            Resected and retrieved.                           - Normal mucosa in the entire examined colon                            otherwise.                           - Non-bleeding non-thrombosed external and internal                            hemorrhoids. Recommendation:           - The patient will be observed post-procedure,                            until all discharge criteria are met.                           - Discharge patient to home.                           - Patient has a contact number available for                            emergencies. The signs and symptoms of potential                            delayed complications were discussed with the                            patient. Return to normal activities tomorrow.                            Written discharge instructions were provided to the                            patient.                           - High fiber diet.                           - Use FiberCon 1-2  tablets PO daily.                           - Continue present medications.                           - Await pathology results.                           - Repeat  colonoscopy in 1 year for surveillance                            based on pathology results if all the polyps                            returned adenomatous, more likely 3 years for                            surveillance however.                           - The findings and recommendations were discussed                            with the patient.                           - The findings and recommendations were discussed                            with the patient's family. Corliss Parish, MD 06/13/2022 11:39:58 AM

## 2022-06-13 NOTE — Progress Notes (Unsigned)
Pt's states no medical or surgical changes since previsit or office visit. 

## 2022-06-14 ENCOUNTER — Telehealth: Payer: Self-pay

## 2022-06-14 NOTE — Telephone Encounter (Signed)
Post procedure follow up call, no answer 

## 2022-06-19 ENCOUNTER — Encounter: Payer: Self-pay | Admitting: Gastroenterology

## 2022-06-26 ENCOUNTER — Other Ambulatory Visit (HOSPITAL_BASED_OUTPATIENT_CLINIC_OR_DEPARTMENT_OTHER): Payer: Self-pay

## 2022-06-26 MED ORDER — TRAZODONE HCL 100 MG PO TABS
100.0000 mg | ORAL_TABLET | Freq: Every evening | ORAL | 2 refills | Status: DC | PRN
  Filled 2022-06-26: qty 60, 30d supply, fill #0
  Filled 2022-08-09: qty 60, 30d supply, fill #1

## 2022-06-26 MED ORDER — ARIPIPRAZOLE 10 MG PO TABS
10.0000 mg | ORAL_TABLET | Freq: Every day | ORAL | 1 refills | Status: DC
  Filled 2022-06-26 – 2022-08-28 (×2): qty 90, 90d supply, fill #0
  Filled 2022-11-26: qty 90, 90d supply, fill #1

## 2022-06-26 MED ORDER — LAMOTRIGINE 100 MG PO TABS
100.0000 mg | ORAL_TABLET | Freq: Two times a day (BID) | ORAL | 1 refills | Status: DC
  Filled 2022-06-26: qty 180, 90d supply, fill #0
  Filled 2022-10-02: qty 180, 90d supply, fill #1

## 2022-07-06 ENCOUNTER — Encounter: Payer: Self-pay | Admitting: Family

## 2022-07-06 ENCOUNTER — Other Ambulatory Visit (HOSPITAL_BASED_OUTPATIENT_CLINIC_OR_DEPARTMENT_OTHER): Payer: Self-pay

## 2022-07-06 ENCOUNTER — Other Ambulatory Visit: Payer: Self-pay

## 2022-07-06 MED ORDER — CARVEDILOL 6.25 MG PO TABS
3.1250 mg | ORAL_TABLET | Freq: Two times a day (BID) | ORAL | 1 refills | Status: DC
  Filled 2022-07-06: qty 90, 90d supply, fill #0
  Filled 2022-10-06: qty 90, 90d supply, fill #1

## 2022-07-06 NOTE — Telephone Encounter (Signed)
Confirmed with pt and her rx is ready for pick up at the [facility] pharmacy.

## 2022-08-07 ENCOUNTER — Other Ambulatory Visit (HOSPITAL_BASED_OUTPATIENT_CLINIC_OR_DEPARTMENT_OTHER): Payer: Self-pay

## 2022-08-07 ENCOUNTER — Other Ambulatory Visit: Payer: Self-pay | Admitting: Family

## 2022-08-07 MED ORDER — ATORVASTATIN CALCIUM 20 MG PO TABS
20.0000 mg | ORAL_TABLET | Freq: Every day | ORAL | 0 refills | Status: DC
  Filled 2022-08-07: qty 90, 90d supply, fill #0

## 2022-08-07 MED ORDER — AMLODIPINE BESYLATE-VALSARTAN 5-320 MG PO TABS
1.0000 | ORAL_TABLET | Freq: Every day | ORAL | 0 refills | Status: DC
  Filled 2022-08-07: qty 90, 90d supply, fill #0

## 2022-08-21 ENCOUNTER — Telehealth: Payer: Self-pay | Admitting: Family

## 2022-08-21 ENCOUNTER — Ambulatory Visit: Payer: Commercial Managed Care - PPO | Admitting: Family

## 2022-08-21 VITALS — BP 138/66 | HR 73 | Temp 98.4°F | Resp 18 | Ht 64.0 in | Wt 153.4 lb

## 2022-08-21 DIAGNOSIS — N183 Chronic kidney disease, stage 3 unspecified: Secondary | ICD-10-CM

## 2022-08-21 DIAGNOSIS — I10 Essential (primary) hypertension: Secondary | ICD-10-CM | POA: Diagnosis not present

## 2022-08-21 DIAGNOSIS — E559 Vitamin D deficiency, unspecified: Secondary | ICD-10-CM | POA: Diagnosis not present

## 2022-08-21 DIAGNOSIS — I252 Old myocardial infarction: Secondary | ICD-10-CM | POA: Diagnosis not present

## 2022-08-21 DIAGNOSIS — E782 Mixed hyperlipidemia: Secondary | ICD-10-CM

## 2022-08-21 DIAGNOSIS — N1831 Chronic kidney disease, stage 3a: Secondary | ICD-10-CM

## 2022-08-21 DIAGNOSIS — E1122 Type 2 diabetes mellitus with diabetic chronic kidney disease: Secondary | ICD-10-CM | POA: Diagnosis not present

## 2022-08-21 NOTE — Assessment & Plan Note (Signed)
Blood pressure controlled at 138/66 on current regimen of Exforge (amlodipine/valsartan) and carvedilol. -Continue current antihypertensive regimen.

## 2022-08-21 NOTE — Telephone Encounter (Signed)
Request faxed

## 2022-08-21 NOTE — Progress Notes (Signed)
Subjective:     Patient ID: Kaitlyn Harper, female    DOB: February 01, 1961, 61 y.o.   MRN: [id number]  Chief Complaint  Patient presents with   3 month follow up    Concerns/ questions: pt says she never received the Zepboud. We will remove from her list today.      HPI  Discussed the use of AI scribe software for clinical note transcription with the patient, who gave verbal consent to proceed.  History of Present Illness   The patient, with a history of hypertension, hyperlipidemia, and chronic kidney disease, presents for a routine follow-up. She reports adherence to her current medication regimen, including amlodipine/valsartan and carvedilol for hypertension, and atorvastatin for hyperlipidemia. She denies any new symptoms or concerns.  The patient also has a history of Bipolar disorder, for which she is followed by a psychiatrist, and reports that her mood has been stable. She is also followed by a nephrologist for her kidney disease and a GYN- Henreitta Leber NP.   The patient does not currently take any vitamin D supplements. She recently had a colonoscopy in May. She also mentions a recent pap smear, performed at an outside facility.          Health Maintenance Due  Topic Date Due   PAP SMEAR-Modifier  09/17/2021   COVID-19 Vaccine (6 - 2023-24 season) 10/13/2021    Past Medical History:  Diagnosis Date   Anemia    resolved    Anxiety    Bipolar disorder (HCC)    Chronic kidney disease stage 3    Depression    Diabetes mellitus without complication (HCC)    type 2-diet controlled   Diverticulitis    History of diverticulitis 12/17/2018   Hyperglycemia    Hyperlipidemia    Hypertension    NSTEMI (non-ST elevated myocardial infarction) (HCC) 2018   reports she fainted at his sons house and they took her to ED  , troponin was elevated and cardiac cath was completed,  no stent placed nor hx of stent intervention , she reports no casue was ever determined for the  sycope episode    Syncope 03/18/2016   reports no recurrence     Past Surgical History:  Procedure Laterality Date   APPENDECTOMY  1985   BREAST BIOPSY Right 01/13/2014   benign   CHOLECYSTECTOMY  1985   COLONOSCOPY W/ POLYPECTOMY  03/01/2015   Dr Wendall Papa, [facility].  +adenomatous polyps   COLONOSCOPY W/ POLYPECTOMY  02/28/2011   4 polyps - 3 TA   DILATION AND CURETTAGE OF UTERUS  2005   HEMORRHOID SURGERY  2002   KNEE SURGERY  1983   arthroscopy?   LEFT HEART CATH AND CORONARY ANGIOGRAPHY N/A 03/19/2016   Procedure: Left Heart Cath and Coronary Angiography;  Surgeon: Peter M Swaziland, MD;  Location: [facility] INVASIVE CV LAB;  Service: Cardiovascular;  Laterality: N/A;   MOUTH SURGERY  03/08/2016   POLYPECTOMY  2006   ?anal polyp?   TEAR DUCT PROBING  2004   TUBAL LIGATION  1985    Family History  Problem Relation Age of Onset   Hypertension Father    Sarcoidosis Brother    Alcohol abuse Brother    Cirrhosis Brother    Stroke Maternal Grandmother    Colon cancer Neg Hx    Colon polyps Neg Hx    Esophageal cancer Neg Hx    Rectal cancer Neg Hx    Stomach cancer Neg Hx  Pulmonary embolism Neg Hx     Social History   Socioeconomic History   Marital status: Single    Spouse name: Not on file   Number of children: 3   Years of education: Not on file   Highest education level: Some college, no degree  Occupational History   Occupation: Armed forces operational officer: [employer]  Tobacco Use   Smoking status: Every Day    Packs/day: .5    Types: Cigarettes    Last attempt to quit: 07/02/2015    Years since quitting: 7.1   Smokeless tobacco: Never   Tobacco comments:    10 cigarettes daily; 10-30 reports " one to two cigarettes every now and again"   Vaping Use   Vaping Use: Never used  Substance and Sexual Activity   Alcohol use: Yes    Alcohol/week: 0.0 standard drinks of alcohol    Comment: socially   Drug use: No   Sexual activity: Yes    Partners:  Male    Birth control/protection: Post-menopausal  Other Topics Concern   Not on file  Social History Narrative   Caffeine use:  2 drinks   Regular exercise: no   Lives with her daughter   3 children    2 grandchildren   1 great grandson   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  (05/18/2022)   Overall Financial Resource Strain (CARDIA)    Difficulty of Paying Living Expenses: Not very hard  Food Insecurity: Food Insecurity Present (05/18/2022)   Hunger Vital Sign    Worried About Running Out of Food in the Last Year: Sometimes true    Ran Out of Food in the Last Year: Never true  Transportation Needs: No Transportation Needs (05/18/2022)   PRAPARE - Administrator, Civil Service (Medical): No    Lack of Transportation (Non-Medical): No  Physical Activity: Insufficiently Active (05/18/2022)   Exercise Vital Sign    Days of Exercise per Week: 2 days    Minutes of Exercise per Session: 60 min  Stress: No Stress Concern Present (05/18/2022)   [employer] of Occupational Health - Occupational Stress Questionnaire    Feeling of Stress : Not at all  Social Connections: Moderately Integrated (05/18/2022)   Social Connection and Isolation Panel [NHANES]    Frequency of Communication with Friends and Family: More than three times a week    Frequency of Social Gatherings with Friends and Family: Once a week    Attends Religious Services: 1 to 4 times per year    Active Member of [employer] or Organizations: Yes    Attends Banker Meetings: 1 to 4 times per year    Marital Status: Divorced  Catering manager Violence: Not on file    Outpatient Medications Prior to Visit  Medication Sig Dispense Refill   amLODipine-valsartan (EXFORGE) 5-320 MG tablet Take 1 tablet by mouth daily. 90 tablet 0   ARIPiprazole (ABILIFY) 10 MG tablet Take 1 tablet (10 mg total) by mouth daily. 90 tablet 1   aspirin EC 81 MG  tablet Take 81 mg by mouth daily.     atorvastatin (LIPITOR) 20 MG tablet Take 1 tablet (20 mg total) by mouth daily. 90 tablet 0   carvedilol (COREG) 6.25 MG tablet Take 0.5 tablets (3.125 mg total) by mouth 2 (two) times daily with a meal. 180 tablet 1   lamoTRIgine (  LAMICTAL) 100 MG tablet Take 1 tablet (100 mg total) by mouth in the morning and at bedtime. 180 tablet 1   psyllium (METAMUCIL) 58.6 % powder Take 1 packet by mouth 3 (three) times daily.     SODIUM FLUORIDE, DENTAL RINSE, (PREVIDENT) 0.2 % SOLN USE AS AN ORAL RINSE, AS DIRECTED ON PACKAGE. 473 mL 5   traZODone (DESYREL) 100 MG tablet TAKE 1-2 TABLETS BY MOUTH AT BEDTIME AS NEEDED FOR SLEEP 60 tablet 5   phenazopyridine (PYRIDIUM) 200 MG tablet Take 1 tablet (200 mg total) by mouth 3 (three) times daily as needed for pain. (Patient not taking: Reported on 08/21/2022) 10 tablet 0   traZODone (DESYREL) 100 MG tablet Take 1-2 tablets (100-200 mg total) by mouth at bedtime as needed for sleep. (Patient not taking: Reported on 08/21/2022) 60 tablet 2   traZODone (DESYREL) 100 MG tablet Take 1-2 tablets (100-200 mg total) by mouth at bedtime as needed for sleep. (Patient not taking: Reported on 08/21/2022) 60 tablet 2   No facility-administered medications prior to visit.    Allergies  Allergen Reactions   Sulfonamide Derivatives Hives and Other (See Comments)    Light sensitivity    ROS See HPI    Objective:    Physical Exam Constitutional:      General: She is not in acute distress.    Appearance: Normal appearance. She is well-developed.  HENT:     Head: Normocephalic and atraumatic.     Right Ear: External ear normal.     Left Ear: External ear normal.  Eyes:     General: No scleral icterus. Neck:     Thyroid: No thyromegaly.  Cardiovascular:     Rate and Rhythm: Normal rate and regular rhythm.     Heart sounds: Normal heart sounds. No murmur heard. Pulmonary:     Effort: Pulmonary effort is normal. No respiratory  distress.     Breath sounds: Normal breath sounds. No wheezing.  Musculoskeletal:     Cervical back: Neck supple.  Skin:    General: Skin is warm and dry.  Neurological:     Mental Status: She is alert and oriented to person, place, and time.  Psychiatric:        Mood and Affect: Mood normal.        Behavior: Behavior normal.        Thought Content: Thought content normal.        Judgment: Judgment normal.      BP 138/66 (BP Location: Left Arm, Patient Position: Sitting, Cuff Size: Normal)   Pulse 73   Temp 98.4 F (36.9 C) (Oral)   Resp 18   Ht 5\' 4"  (1.626 m)   Wt 153 lb 6.4 oz (69.6 kg)   LMP 12/25/2008   SpO2 100%   BMI 26.33 kg/m  Wt Readings from Last 3 Encounters:  08/21/22 153 lb 6.4 oz (69.6 kg)  06/13/22 150 lb (68 kg)  05/21/22 156 lb (70.8 kg)       Assessment & Plan:   Problem List Items Addressed This Visit       Unprioritized   Hyperlipidemia - Primary (Chronic)    On atorvastatin, cholesterol not checked in a year. -Continue atorvastatin. -Order lipid panel.      Relevant Orders   Lipid panel   Comp Met (CMET)   Vitamin D deficiency     Not currently on supplementation, last checked a while ago. -Order Vitamin D level.  Relevant Orders   VITAMIN D 25 Hydroxy (Vit-D Deficiency, Fractures)   Hx of myocardial infarction    She has not seen cardiology in some time. Will arrange a follow up visit.       Relevant Orders   Ambulatory referral to Cardiology   Essential hypertension     Blood pressure controlled at 138/66 on current regimen of Exforge (amlodipine/valsartan) and carvedilol. -Continue current antihypertensive regimen.      Diabetes type 2, controlled (HCC)     Not currently on supplementation. Has not been checked recently.  -Order Vitamin D level.      CKD stage 3a, GFR 45-59 ml/min [facility])    Patient follows with nephrology at [facility]. -Order renal function tests. -Continue to avoid nephrotoxic  medications like Motrin and Aleve.       I have discontinued Lennyn A. Saxby "Gwen"'s phenazopyridine. I am also having her maintain her aspirin EC, psyllium, traZODone, PreviDent, ARIPiprazole, lamoTRIgine, carvedilol, amLODipine-valsartan, and atorvastatin.  No orders of the defined types were placed in this encounter.

## 2022-08-21 NOTE — Telephone Encounter (Signed)
Please call Kaitlyn Harper's office to request pap results.

## 2022-08-21 NOTE — Assessment & Plan Note (Signed)
Patient follows with nephrology at [facility]. -Order renal function tests. -Continue to avoid nephrotoxic medications like Motrin and Aleve.

## 2022-08-21 NOTE — Assessment & Plan Note (Signed)
Not currently on supplementation. Has not been checked recently.  -Order Vitamin D level.

## 2022-08-21 NOTE — Assessment & Plan Note (Signed)
Not currently on supplementation, last checked a while ago. -Order Vitamin D level.

## 2022-08-21 NOTE — Assessment & Plan Note (Signed)
She has not seen cardiology in some time. Will arrange a follow up visit.

## 2022-08-21 NOTE — Patient Instructions (Signed)
VISIT SUMMARY:  During your recent visit, we discussed your ongoing management of hypertension, hyperlipidemia, and chronic kidney disease. You reported that you have been taking your medications as prescribed and have not experienced any new symptoms or concerns. We also touched on your psychiatric health and your follow-ups with your nephrologist and oncologist.  YOUR PLAN:  -HYPERTENSION: Your blood pressure is well-controlled with your current medications. Hypertension is a condition where the force of the blood against the artery walls is too high.  -HYPERLIPIDEMIA: You are currently on atorvastatin for high cholesterol. Hyperlipidemia is a condition where there are high levels of fats (lipids) in the blood.  -VITAMIN D DEFICIENCY: You are not currently taking any vitamin D supplements. Vitamin D deficiency is a condition where the level of vitamin D in your body is too low.  -RENAL DISEASE: You are being followed by a nephrologist for your kidney disease. Renal disease is a condition where the kidneys do not function properly.  -CARDIOVASCULAR DISEASE: You have not seen your cardiologist in a while. Cardiovascular disease is a class of diseases that involve the heart or blood vessels.  INSTRUCTIONS:  Please continue taking your current medications for hypertension and hyperlipidemia. We will be ordering a lipid panel to check your cholesterol levels and a Vitamin D level test. We will also order renal function tests to monitor your kidney disease. Please avoid taking medications like Motrin and Aleve as they can harm your kidneys. We will schedule an appointment for you with Dr. Antoine Poche, your cardiologist, for follow up. Please continue your follow-ups with your psychiatrist and GYN.

## 2022-08-21 NOTE — Assessment & Plan Note (Signed)
On atorvastatin, cholesterol not checked in a year. -Continue atorvastatin. -Order lipid panel.

## 2022-08-22 ENCOUNTER — Other Ambulatory Visit (HOSPITAL_BASED_OUTPATIENT_CLINIC_OR_DEPARTMENT_OTHER): Payer: Self-pay

## 2022-08-22 ENCOUNTER — Telehealth: Payer: Self-pay | Admitting: Family

## 2022-08-22 DIAGNOSIS — E559 Vitamin D deficiency, unspecified: Secondary | ICD-10-CM

## 2022-08-22 LAB — COMPREHENSIVE METABOLIC PANEL
ALT: 17 U/L (ref 0–35)
AST: 16 U/L (ref 0–37)
Albumin: 4.4 g/dL (ref 3.5–5.2)
Alkaline Phosphatase: 86 U/L (ref 39–117)
BUN: 10 mg/dL (ref 6–23)
CO2: 27 mEq/L (ref 19–32)
Calcium: 9.8 mg/dL (ref 8.4–10.5)
Chloride: 105 mEq/L (ref 96–112)
Creatinine, Ser: 1.19 mg/dL (ref 0.40–1.20)
GFR: 48.4 mL/min — ABNORMAL LOW (ref >60.00)
Glucose, Bld: 119 mg/dL — ABNORMAL HIGH (ref 70–99)
Potassium: 3.7 mEq/L (ref 3.5–5.1)
Sodium: 140 mEq/L (ref 135–145)
Total Bilirubin: 0.5 mg/dL (ref 0.2–1.2)
Total Protein: 7.5 g/dL (ref 6.0–8.3)

## 2022-08-22 LAB — LIPID PANEL
Cholesterol: 104 mg/dL (ref 0–200)
HDL: 33.6 mg/dL — ABNORMAL LOW (ref >39.00)
LDL Cholesterol: 52 mg/dL (ref 0–99)
NonHDL: 70.63
Total CHOL/HDL Ratio: 3
Triglycerides: 95 mg/dL (ref 0.0–149.0)
VLDL: 19 mg/dL (ref 0.0–40.0)

## 2022-08-22 LAB — VITAMIN D 25 HYDROXY (VIT D DEFICIENCY, FRACTURES): VITD: 17.03 ng/mL — ABNORMAL LOW (ref 30.00–100.00)

## 2022-08-22 MED ORDER — VITAMIN D (ERGOCALCIFEROL) 1.25 MG (50000 UNIT) PO CAPS
50000.0000 [IU] | ORAL_CAPSULE | ORAL | 0 refills | Status: DC
Start: 2022-08-22 — End: 2022-11-28
  Filled 2022-08-22: qty 12, 84d supply, fill #0

## 2022-08-22 NOTE — Telephone Encounter (Signed)
Kidney function is stable.  Vitamin D level is low.  Advise patient to begin vit D 50000 units once weekly for 12 weeks, then repeat vit D level (dx Vit D deficiency).

## 2022-08-28 ENCOUNTER — Other Ambulatory Visit (HOSPITAL_BASED_OUTPATIENT_CLINIC_OR_DEPARTMENT_OTHER): Payer: Self-pay

## 2022-09-03 NOTE — Telephone Encounter (Signed)
Were you able to get in touch with her?

## 2022-09-04 NOTE — Telephone Encounter (Signed)
Called patient but no answer, left voice mail for patient to call back.   

## 2022-09-04 NOTE — Telephone Encounter (Signed)
Patient notified of information in this message, she picked up vitamin D and taking once a week. She is already scheduled to return in 3 months for follow up

## 2022-09-07 ENCOUNTER — Other Ambulatory Visit (HOSPITAL_BASED_OUTPATIENT_CLINIC_OR_DEPARTMENT_OTHER): Payer: Self-pay

## 2022-10-02 ENCOUNTER — Other Ambulatory Visit: Payer: Self-pay

## 2022-10-11 ENCOUNTER — Other Ambulatory Visit (HOSPITAL_BASED_OUTPATIENT_CLINIC_OR_DEPARTMENT_OTHER): Payer: Self-pay

## 2022-10-11 MED ORDER — TRAZODONE HCL 100 MG PO TABS
100.0000 mg | ORAL_TABLET | Freq: Every evening | ORAL | 0 refills | Status: DC | PRN
  Filled 2022-10-11: qty 180, 90d supply, fill #0

## 2022-10-26 DIAGNOSIS — Z01419 Encounter for gynecological examination (general) (routine) without abnormal findings: Secondary | ICD-10-CM | POA: Diagnosis not present

## 2022-10-26 DIAGNOSIS — Z1331 Encounter for screening for depression: Secondary | ICD-10-CM | POA: Diagnosis not present

## 2022-10-26 DIAGNOSIS — Z1239 Encounter for other screening for malignant neoplasm of breast: Secondary | ICD-10-CM | POA: Diagnosis not present

## 2022-10-26 DIAGNOSIS — Z1211 Encounter for screening for malignant neoplasm of colon: Secondary | ICD-10-CM | POA: Diagnosis not present

## 2022-10-26 DIAGNOSIS — Z124 Encounter for screening for malignant neoplasm of cervix: Secondary | ICD-10-CM | POA: Diagnosis not present

## 2022-10-26 DIAGNOSIS — Z139 Encounter for screening, unspecified: Secondary | ICD-10-CM | POA: Diagnosis not present

## 2022-10-26 DIAGNOSIS — I10 Essential (primary) hypertension: Secondary | ICD-10-CM | POA: Diagnosis not present

## 2022-10-30 DIAGNOSIS — R931 Abnormal findings on diagnostic imaging of heart and coronary circulation: Secondary | ICD-10-CM | POA: Insufficient documentation

## 2022-10-30 NOTE — Progress Notes (Unsigned)
Cardiology Office Note   Date:  10/31/2022   ID:  Kaitlyn Harper, DOB 1960-10-12, MRN [id number]  PCP:  Kaitlyn Craze, NP  Cardiologist:   None Referring:  Kaitlyn Craze, NP  Chief Complaint  Patient presents with   Heart Murmur      History of Present Illness: Kaitlyn Harper is a 62 y.o. female who presents for evaluation of murmur.  I last saw her in 2018.  She had syncope at that time.  She also had an elevated troponin.  Cath in 2018 demonstrated normal coronaries.  Echo demonstrated septal hypertrophy.  This is very mild.  She returns for evaluation of this.  Also her EKG demonstrates some poor anterior R wave progression nondiagnostic inferior Q waves.  She is actually done quite well.  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  The patient denies any new symptoms such as chest discomfort, neck or arm discomfort. There has been no new shortness of breath, PND or orthopnea. There have been no reported palpitations, presyncope or syncope.    Past Medical History:  Diagnosis Date   Anemia    resolved    Anxiety    Bipolar disorder (HCC)    Chronic kidney disease stage 3    Depression    Diabetes mellitus without complication (HCC)    type 2-diet controlled   Diverticulitis    History of diverticulitis 12/17/2018   Hyperglycemia    Hyperlipidemia    Hypertension    NSTEMI (non-ST elevated myocardial infarction) (HCC) 2018   reports she fainted at his sons house and they took her to ED  , troponin was elevated and cardiac cath was completed,  no stent placed nor hx of stent intervention , she reports no casue was ever determined for the sycope episode    Syncope 03/18/2016   reports no recurrence     Past Surgical History:  Procedure Laterality Date   APPENDECTOMY  1985   BREAST BIOPSY Right 01/13/2014   benign   CHOLECYSTECTOMY  1985   COLONOSCOPY W/ POLYPECTOMY   03/01/2015   Dr Kaitlyn Harper, [facility].  +adenomatous polyps   COLONOSCOPY W/ POLYPECTOMY  02/28/2011   4 polyps - 3 TA   DILATION AND CURETTAGE OF UTERUS  2005   HEMORRHOID SURGERY  2002   KNEE SURGERY  1983   arthroscopy?   LEFT HEART CATH AND CORONARY ANGIOGRAPHY N/A 03/19/2016   Procedure: Left Heart Cath and Coronary Angiography;  Surgeon: Peter M Swaziland, MD;  Location: [facility] INVASIVE CV LAB;  Service: Cardiovascular;  Laterality: N/A;   MOUTH SURGERY  03/08/2016   POLYPECTOMY  2006   ?anal polyp?   TEAR DUCT PROBING  2004   TUBAL LIGATION  1985     Current Outpatient Medications  Medication Sig Dispense Refill   amLODipine-valsartan (EXFORGE) 5-320 MG tablet Take 1 tablet by mouth daily. 90 tablet 0   ARIPiprazole (ABILIFY) 10 MG tablet Take 1 tablet (10 mg total) by mouth daily. 90 tablet 1   aspirin EC 81 MG tablet Take 81 mg by mouth daily.     atorvastatin (LIPITOR) 20 MG tablet Take 1 tablet (20 mg total) by mouth daily. 90 tablet 0   lamoTRIgine (LAMICTAL) 100 MG tablet Take 1 tablet (100 mg total) by mouth in the morning and at bedtime. 180 tablet 1   psyllium (METAMUCIL) 58.6 % powder Take 1  packet by mouth 3 (three) times daily.     SODIUM FLUORIDE, DENTAL RINSE, (PREVIDENT) 0.2 % SOLN USE AS AN ORAL RINSE, AS DIRECTED ON PACKAGE. 473 mL 5   traZODone (DESYREL) 100 MG tablet Take 1-2 tablets (100-200 mg total) by mouth at bedtime as needed. 180 tablet 0   Vitamin D, Ergocalciferol, (DRISDOL) 1.25 MG (50000 UNIT) CAPS capsule Take 1 capsule (50,000 Units total) by mouth every 7 (seven) days. 12 capsule 0   carvedilol (COREG) 3.125 MG tablet Take 1 tablet (3.125 mg total) by mouth 2 (two) times daily with a meal. 180 tablet 3   No current facility-administered medications for this visit.    Allergies:   Sulfonamide derivatives    Social History:  The patient  reports that she has been smoking cigarettes. She has never used smokeless tobacco. She reports current alcohol  use. She reports that she does not use drugs.   Family History:  The patient's family history includes Alcohol abuse in her brother; Cirrhosis in her brother; Hypertension in her father; Sarcoidosis in her brother; Stroke in her maternal grandmother.    ROS:  Please see the history of present illness.   Otherwise, review of systems are positive for none.   All other systems are reviewed and negative.    PHYSICAL EXAM: VS:  BP (!) 160/84 (BP Location: Left Arm, Patient Position: Sitting, Cuff Size: Normal)   Pulse 76   Ht 5\' 4"  (1.626 m)   Wt 157 lb (71.2 kg)   LMP 12/25/2008   SpO2 98%   BMI 26.95 kg/m  , BMI Body mass index is 26.95 kg/m. GENERAL:  Well appearing HEENT:  Pupils equal round and reactive, fundi not visualized, oral mucosa unremarkable NECK:  No jugular venous distention, waveform within normal limits, carotid upstroke brisk and symmetric, no bruits, no thyromegaly LYMPHATICS:  No cervical, inguinal adenopathy LUNGS:  Clear to auscultation bilaterally BACK:  No CVA tenderness CHEST:  Unremarkable HEART:  PMI not displaced or sustained,S1 and S2 within normal limits, no S3, no S4, no clicks, no rubs, 2 out of 6 apical systolic murmur radiating slightly at the aortic outflow tract and increasing slightly with the strain phase of Valsalva and upright posture, no diastolic murmurs ABD:  Flat, positive bowel sounds normal in frequency in pitch, no bruits, no rebound, no guarding, no midline pulsatile mass, no hepatomegaly, no splenomegaly EXT:  2 plus pulses throughout, no edema, no cyanosis no clubbing SKIN:  No rashes no nodules NEURO:  Cranial nerves II through XII grossly intact, motor grossly intact throughout [facility]:  Cognitively intact, oriented to person place and time    EKG:  EKG Interpretation Date/Time:  Wednesday October 31 2022 14:34:22 EDT Ventricular Rate:  76 PR Interval:  224 QRS Duration:  82 QT Interval:  388 QTC Calculation: 436 R  Axis:   -25  Text Interpretation: Sinus rhythm with 1st degree A-V block Possible Left atrial enlargement Low voltage QRS When compared with ECG of 12-Dec-2018 14:19, No significant change since last tracing Confirmed by Rollene Rotunda ([id number]) on 10/31/2022 2:43:19 PM       Recent Labs: 08/21/2022: ALT 17; BUN 10; Creatinine, Ser 1.19; Potassium 3.7; Sodium 140    Lipid Panel    Component Value Date/Time   CHOL 104 08/21/2022 1436   TRIG 95.0 08/21/2022 1436   HDL 33.60 (L) 08/21/2022 1436   CHOLHDL 3 08/21/2022 1436   VLDL 19.0 08/21/2022 1436   LDLCALC 52 08/21/2022 1436  LDLCALC 76 07/17/2019 1536      Wt Readings from Last 3 Encounters:  10/31/22 157 lb (71.2 kg)  08/21/22 153 lb 6.4 oz (69.6 kg)  06/13/22 150 lb (68 kg)      Other studies Reviewed: Additional studies/ records that were reviewed today include: Previous echo. Review of the above records demonstrates:  Please see elsewhere in the note.     ASSESSMENT AND PLAN:  Murmur: I suspect she might have had some progression of her septal hypertrophy and has a slight dynamic gradient.  I do not think this is clinically causing any problems but I would like to screen this with another echocardiogram with follow-up MRI as needed and other testing as needed such as genetics.  She and I discussed this briefly.  HTN: Her blood pressure is elevated.  I will give her a BP diary.  She is to return this to me in two weeks.   Of note she reported that her blood pressure was running too low and she is only been taking her carvedilol once daily and actually has been cutting it in half.  I am going to rewrite the prescription to 3.125 mg twice daily.  Septal hypertrophy:  This will be evaluated as above.    Current medicines are reviewed at length with the patient today.  The patient does not have concerns regarding medicines.  The following changes have been made:  no change  Labs/ tests ordered today include:   Orders  Placed This Encounter  Procedures   EKG 12-Lead   ECHOCARDIOGRAM COMPLETE     Disposition:   FU with with me in a couple of years.  She will return her BP diary.      Signed, Rollene Rotunda, MD  10/31/2022 3:05 PM    [facility]

## 2022-10-31 ENCOUNTER — Encounter: Payer: Self-pay | Admitting: Cardiology

## 2022-10-31 ENCOUNTER — Ambulatory Visit: Payer: Commercial Managed Care - PPO | Attending: Cardiology | Admitting: Cardiology

## 2022-10-31 ENCOUNTER — Other Ambulatory Visit (HOSPITAL_BASED_OUTPATIENT_CLINIC_OR_DEPARTMENT_OTHER): Payer: Self-pay

## 2022-10-31 VITALS — BP 160/84 | HR 76 | Ht 64.0 in | Wt 157.0 lb

## 2022-10-31 DIAGNOSIS — I252 Old myocardial infarction: Secondary | ICD-10-CM | POA: Diagnosis not present

## 2022-10-31 DIAGNOSIS — R931 Abnormal findings on diagnostic imaging of heart and coronary circulation: Secondary | ICD-10-CM

## 2022-10-31 DIAGNOSIS — R55 Syncope and collapse: Secondary | ICD-10-CM

## 2022-10-31 DIAGNOSIS — I10 Essential (primary) hypertension: Secondary | ICD-10-CM

## 2022-10-31 DIAGNOSIS — R011 Cardiac murmur, unspecified: Secondary | ICD-10-CM

## 2022-10-31 MED ORDER — CARVEDILOL 3.125 MG PO TABS
3.1250 mg | ORAL_TABLET | Freq: Two times a day (BID) | ORAL | 3 refills | Status: DC
  Filled 2022-10-31: qty 180, 90d supply, fill #0
  Filled 2023-03-16: qty 180, 90d supply, fill #1

## 2022-10-31 NOTE — Patient Instructions (Signed)
Medication Instructions:  NO CHANGES   Testing/Procedures: Your physician has requested that you have an echocardiogram. Echocardiography is a painless test that uses sound waves to create images of your heart. It provides your doctor with information about the size and shape of your heart and how well your heart's chambers and valves are working. This procedure takes approximately one hour. There are no restrictions for this procedure. Please do NOT wear cologne, perfume, aftershave, or lotions (deodorant is allowed). Please arrive 15 minutes prior to your appointment time.  Locations:  [facility] - [address] 3rd Floor [facility]  - [address]     Follow-Up: At [facility], you and your health needs are our priority.  As part of our continuing mission to provide you with exceptional heart care, we have created designated Provider Care Teams.  These Care Teams include your primary Cardiologist (physician) and Advanced Practice Providers (APPs -  Physician Assistants and Nurse Practitioners) who all work together to provide you with the care you need, when you need it.  We recommend signing up for the patient portal called "MyChart".  Sign up information is provided on this After Visit Summary.  MyChart is used to connect with patients for Virtual Visits (Telemedicine).  Patients are able to view lab/test results, encounter notes, upcoming appointments, etc.  Non-urgent messages can be sent to your provider as well.   To learn more about what you can do with MyChart, go to [email or web address].    Your next appointment:   3 years with Dr. Antoine Poche

## 2022-11-06 ENCOUNTER — Other Ambulatory Visit (HOSPITAL_BASED_OUTPATIENT_CLINIC_OR_DEPARTMENT_OTHER): Payer: Self-pay

## 2022-11-06 ENCOUNTER — Other Ambulatory Visit: Payer: Self-pay | Admitting: Family

## 2022-11-06 MED ORDER — ATORVASTATIN CALCIUM 20 MG PO TABS
20.0000 mg | ORAL_TABLET | Freq: Every day | ORAL | 0 refills | Status: DC
  Filled 2022-11-06: qty 90, 90d supply, fill #0

## 2022-11-06 MED ORDER — AMLODIPINE BESYLATE-VALSARTAN 5-320 MG PO TABS
1.0000 | ORAL_TABLET | Freq: Every day | ORAL | 0 refills | Status: DC
  Filled 2022-11-06: qty 90, 90d supply, fill #0

## 2022-11-23 ENCOUNTER — Ambulatory Visit: Payer: Commercial Managed Care - PPO | Admitting: Family

## 2022-11-27 ENCOUNTER — Ambulatory Visit: Payer: Commercial Managed Care - PPO | Admitting: Family

## 2022-11-27 VITALS — BP 120/88 | HR 75 | Temp 98.3°F | Resp 16 | Ht 64.0 in | Wt 160.0 lb

## 2022-11-27 DIAGNOSIS — E559 Vitamin D deficiency, unspecified: Secondary | ICD-10-CM | POA: Diagnosis not present

## 2022-11-27 DIAGNOSIS — J302 Other seasonal allergic rhinitis: Secondary | ICD-10-CM

## 2022-11-27 DIAGNOSIS — E1122 Type 2 diabetes mellitus with diabetic chronic kidney disease: Secondary | ICD-10-CM

## 2022-11-27 DIAGNOSIS — I252 Old myocardial infarction: Secondary | ICD-10-CM

## 2022-11-27 DIAGNOSIS — I10 Essential (primary) hypertension: Secondary | ICD-10-CM

## 2022-11-27 DIAGNOSIS — E782 Mixed hyperlipidemia: Secondary | ICD-10-CM

## 2022-11-27 DIAGNOSIS — Z23 Encounter for immunization: Secondary | ICD-10-CM | POA: Diagnosis not present

## 2022-11-27 DIAGNOSIS — N1831 Chronic kidney disease, stage 3a: Secondary | ICD-10-CM

## 2022-11-27 DIAGNOSIS — N183 Chronic kidney disease, stage 3 unspecified: Secondary | ICD-10-CM

## 2022-11-27 NOTE — Assessment & Plan Note (Signed)
Stable.  Monitor.  

## 2022-11-27 NOTE — Assessment & Plan Note (Signed)
Lab Results  Component Value Date   CHOL 104 08/21/2022   HDL 33.60 (L) 08/21/2022   LDLCALC 52 08/21/2022   TRIG 95.0 08/21/2022   CHOLHDL 3 08/21/2022   LDL at goal on atorvastatin, continue same.

## 2022-11-27 NOTE — Assessment & Plan Note (Signed)
Completed a 12 week course of vit D 50000 international units. Update level.

## 2022-11-27 NOTE — Patient Instructions (Signed)
VISIT SUMMARY:  During your recent visit, we discussed your ongoing health conditions including hypertension, diabetes, and hyperlipidemia. We also talked about your mental health, vitamin D deficiency, cardiac and renal health, and your tobacco use. Your blood pressure was initially high but improved with rechecking. Your diabetes is well-controlled, and your cholesterol levels are good with your current medication. We also discussed your smoking habits and potential strategies to quit.  YOUR PLAN:  -HYPERTENSION: Hypertension is high blood pressure. We will continue with your current medications (amlodipine, valsartan, and carvedilol) as they seem to be working well.  -MENTAL HEALTH: Your mental health is stable under the care of your psychiatrist, Deatra Robinson. No changes are needed at this time.  -DIABETES: Diabetes is a condition where your blood sugar levels are too high. Your recent A1C test shows good control of your diabetes. We will check your A1C levels again today.  -VITAMIN D DEFICIENCY: Vitamin D deficiency means your body doesn't have enough vitamin D, which is important for bone health. We will check your Vitamin D levels today.  -HYPERLIPIDEMIA: Hyperlipidemia is high cholesterol. Your cholesterol is well-controlled on atorvastatin, so we will continue this medication.  -CARDIAC HEALTH: You are under the care of Dr. Rutha Bouchard for your heart health and are scheduled for an echocardiogram. No changes are needed at this time.  -RENAL HEALTH: You are under the care of [facility] for your kidney health. No changes are needed at this time.  -TOBACCO USE: You reported smoking about a third of a pack a day. We discussed reaching out to your psychiatrist about restarting Wellbutrin, a medication that has helped you in the past. We also discussed the potential use of Chantix, but due to potential mood side effects, we would recommend you consider wellbutrin first under the  direction of your psychiatrist.  INSTRUCTIONS:  Today, we will administer your influenza vaccine and check your kidney function. We will also check your A1C and Vitamin D levels. Please continue with your current medications and reach out to your psychiatrist about potentially restarting Wellbutrin to help with smoking cessation. We will follow up in 4 months.

## 2022-11-27 NOTE — Assessment & Plan Note (Signed)
Patient is followed by cardiology- Dr. Antoine Poche and has an upcoming 2D echo scheduled.

## 2022-11-27 NOTE — Progress Notes (Signed)
Subjective:     Patient ID: Kaitlyn Harper, female    DOB: 07/15/1960, 62 y.o.   MRN: [id number]  Chief Complaint  Patient presents with   Hypertension    Here for follow up    Hypertension    Discussed the use of AI scribe software for clinical note transcription with the patient, who gave verbal consent to proceed.  History of Present Illness   The patient, with a history of hypertension, hyperlipidemia, and diabetes, presents for a routine follow-up. She reports that her blood pressure was slightly elevated at 158/85, and she is currently taking amlodipine, valsartan, and carvedilol for management.  In terms of mental health, the patient reports stability and is currently under the care of a psychiatrist, Deatra Robinson. Her most recent A1c was excellent at 5.6, indicating good control of her diabetes.  The patient works part-time in [employer] and reports that her seasonal allergies are currently well-managed. She has received her flu shot at work and her cholesterol is well-controlled on atorvastatin.  The patient admits to smoking about a third of a pack a day, which has been an ongoing issue. She has tried nicotine patches and Wellbutrin in the past to quit, with some success with Wellbutrin.  Finally, the patient reports good sleep and no current need for medication refills.       BP Readings from Last 3 Encounters:  11/27/22 120/88  10/31/22 (!) 160/84  08/21/22 138/66      Health Maintenance Due  Topic Date Due   INFLUENZA VACCINE  09/13/2022   COVID-19 Vaccine (6 - 2023-24 season) 10/14/2022   HEMOGLOBIN A1C  11/20/2022    Past Medical History:  Diagnosis Date   Anemia    resolved    Anxiety    Bipolar disorder (HCC)    Chronic kidney disease stage 3    Depression    Diabetes mellitus without complication (HCC)    type 2-diet controlled   Diverticulitis    History of diverticulitis 12/17/2018   Hyperglycemia    Hyperlipidemia    Hypertension     NSTEMI (non-ST elevated myocardial infarction) (HCC) 2018   reports she fainted at his sons house and they took her to ED  , troponin was elevated and cardiac cath was completed,  no stent placed nor hx of stent intervention , she reports no casue was ever determined for the sycope episode    Syncope 03/18/2016   reports no recurrence     Past Surgical History:  Procedure Laterality Date   APPENDECTOMY  1985   BREAST BIOPSY Right 01/13/2014   benign   CHOLECYSTECTOMY  1985   COLONOSCOPY W/ POLYPECTOMY  03/01/2015   Dr Wendall Papa, [facility].  +adenomatous polyps   COLONOSCOPY W/ POLYPECTOMY  02/28/2011   4 polyps - 3 TA   DILATION AND CURETTAGE OF UTERUS  2005   HEMORRHOID SURGERY  2002   KNEE SURGERY  1983   arthroscopy?   LEFT HEART CATH AND CORONARY ANGIOGRAPHY N/A 03/19/2016   Procedure: Left Heart Cath and Coronary Angiography;  Surgeon: Peter M Swaziland, MD;  Location: [facility] INVASIVE CV LAB;  Service: Cardiovascular;  Laterality: N/A;   MOUTH SURGERY  03/08/2016   POLYPECTOMY  2006   ?anal polyp?   TEAR DUCT PROBING  2004   TUBAL LIGATION  1985    Family History  Problem Relation Age of Onset   Hypertension Father    Sarcoidosis Brother    Alcohol abuse Brother  Cirrhosis Brother    Stroke Maternal Grandmother    Colon cancer Neg Hx    Colon polyps Neg Hx    Esophageal cancer Neg Hx    Rectal cancer Neg Hx    Stomach cancer Neg Hx    Pulmonary embolism Neg Hx     Social History   Socioeconomic History   Marital status: Single    Spouse name: Not on file   Number of children: 3   Years of education: Not on file   Highest education level: Some college, no degree  Occupational History   Occupation: Armed forces operational officer: [employer]  Tobacco Use   Smoking status: Some Days    Current packs/day: 0.00    Types: Cigarettes    Last attempt to quit: 07/02/2015    Years since quitting: 7.4   Smokeless tobacco: Never   Tobacco comments:    10  cigarettes daily; 10-30 reports " one to two cigarettes every now and again"     10/31/22 Patient states that she smokes about a pack of cigarettes a week.   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  (11/26/2022)   Overall Financial Resource Strain (CARDIA)    Difficulty of Paying Living Expenses: Not very hard  Food Insecurity: No Food Insecurity (11/26/2022)   Hunger Vital Sign    Worried About Running Out of Food in the Last Year: Never true    Ran Out of Food in the Last Year: Never true  Transportation Needs: No Transportation Needs (11/26/2022)   PRAPARE - Administrator, Civil Service (Medical): No    Lack of Transportation (Non-Medical): No  Physical Activity: Insufficiently Active (11/26/2022)   Exercise Vital Sign    Days of Exercise per Week: 3 days    Minutes of Exercise per Session: 30 min  Stress: No Stress Concern Present (11/26/2022)   [employer] of Occupational Health - Occupational Stress Questionnaire    Feeling of Stress : Not at all  Social Connections: Moderately Integrated (11/26/2022)   Social Connection and Isolation Panel [NHANES]    Frequency of Communication with Friends and Family: More than three times a week    Frequency of Social Gatherings with Friends and Family: Once a week    Attends Religious Services: 1 to 4 times per year    Active Member of [employer] or Organizations: No    Attends Engineer, structural: 1 to 4 times per year    Marital  Status: Divorced  Catering manager Violence: Not on file    Outpatient Medications Prior to Visit  Medication Sig Dispense Refill   amLODipine-valsartan (EXFORGE) 5-320 MG tablet Take 1 tablet by mouth daily. 90 tablet 0   ARIPiprazole (ABILIFY) 10 MG tablet Take 1 tablet (10 mg total) by mouth daily. 90 tablet 1   aspirin EC 81 MG tablet  Take 81 mg by mouth daily.     atorvastatin (LIPITOR) 20 MG tablet Take 1 tablet (20 mg total) by mouth daily. 90 tablet 0   carvedilol (COREG) 3.125 MG tablet Take 1 tablet (3.125 mg total) by mouth 2 (two) times daily with a meal. 180 tablet 3   lamoTRIgine (LAMICTAL) 100 MG tablet Take 1 tablet (100 mg total) by mouth in the morning and at bedtime. 180 tablet 1   psyllium (METAMUCIL) 58.6 % powder Take 1 packet by mouth 3 (three) times daily.     SODIUM FLUORIDE, DENTAL RINSE, (PREVIDENT) 0.2 % SOLN USE AS AN ORAL RINSE, AS DIRECTED ON PACKAGE. 473 mL 5   traZODone (DESYREL) 100 MG tablet Take 1-2 tablets (100-200 mg total) by mouth at bedtime as needed. 180 tablet 0   Vitamin D, Ergocalciferol, (DRISDOL) 1.25 MG (50000 UNIT) CAPS capsule Take 1 capsule (50,000 Units total) by mouth every 7 (seven) days. 12 capsule 0   No facility-administered medications prior to visit.    Allergies  Allergen Reactions   Sulfonamide Derivatives Hives and Other (See Comments)    Light sensitivity    ROS     Objective:    Physical Exam Constitutional:      General: She is not in acute distress.    Appearance: Normal appearance. She is well-developed.  HENT:     Head: Normocephalic and atraumatic.     Right Ear: External ear normal.     Left Ear: External ear normal.  Eyes:     General: No scleral icterus. Neck:     Thyroid: No thyromegaly.  Cardiovascular:     Rate and Rhythm: Normal rate and regular rhythm.     Heart sounds: Normal heart sounds. No murmur heard. Pulmonary:     Effort: Pulmonary effort is normal. No respiratory distress.     Breath  sounds: Normal breath sounds. No wheezing.  Musculoskeletal:     Cervical back: Neck supple.  Skin:    General: Skin is warm and dry.  Neurological:     Mental Status: She is alert and oriented to person, place, and time.  Psychiatric:        Mood and Affect: Mood normal.        Behavior: Behavior normal.        Thought Content: Thought content normal.        Judgment: Judgment normal.      BP 120/88   Pulse 75   Temp 98.3 F (36.8 C) (Oral)   Resp 16   Ht 5\' 4"  (1.626 m)   Wt 160 lb (72.6 kg)   LMP 12/25/2008   SpO2 100%   BMI 27.46 kg/m  Wt Readings from Last 3 Encounters:  11/27/22 160 lb (72.6 kg)  10/31/22 157 lb (71.2 kg)  08/21/22 153 lb 6.4 oz (69.6 kg)       Assessment & Plan:   Problem List Items Addressed This Visit       Unprioritized   Hyperlipidemia (Chronic)    Lab Results  Component Value Date   CHOL 104 08/21/2022   HDL 33.60 (L) 08/21/2022   LDLCALC 52 08/21/2022   TRIG 95.0 08/21/2022   CHOLHDL 3 08/21/2022   LDL at goal on atorvastatin, continue same.       Vitamin D deficiency    Completed a 12 week course of vit D 50000 international units. Update level.       Relevant Orders   Vitamin D (25 hydroxy)  Seasonal allergies    Stable.  Monitor.       Hx of myocardial infarction    Patient is followed by cardiology- Dr. Antoine Poche and has an upcoming 2D echo scheduled.       Essential hypertension    Initial bp elevated, repeat bp WNL. Continue exforge/carvedilol.       Diabetes type 2, controlled (HCC) - Primary    Lab Results  Component Value Date   HGBA1C 5.6 05/21/2022   HGBA1C 5.2 11/10/2021   HGBA1C 5.8 05/09/2021   Lab Results  Component Value Date   MICROALBUR 1.7 05/21/2022   LDLCALC 52 08/21/2022   CREATININE 1.19 08/21/2022   Wt Readings from Last 3 Encounters:  11/27/22 160 lb (72.6 kg)  10/31/22 157 lb (71.2 kg)  08/21/22 153 lb 6.4 oz (69.6 kg)   Diet controlled.       Relevant Orders   Basic  Metabolic Panel (BMET)   HgB A1c   CKD stage 3a, GFR 45-59 ml/min (HCC)    Continues to follow with [facility].        I am having Britney A. Caya "Gwen" maintain her aspirin EC, psyllium, PreviDent, ARIPiprazole, lamoTRIgine, Vitamin D (Ergocalciferol), traZODone, carvedilol, amLODipine-valsartan, and atorvastatin.  No orders of the defined types were placed in this encounter.

## 2022-11-27 NOTE — Assessment & Plan Note (Signed)
Initial bp elevated, repeat bp WNL. Continue exforge/carvedilol.

## 2022-11-27 NOTE — Assessment & Plan Note (Addendum)
Lab Results  Component Value Date   HGBA1C 5.6 05/21/2022   HGBA1C 5.2 11/10/2021   HGBA1C 5.8 05/09/2021   Lab Results  Component Value Date   MICROALBUR 1.7 05/21/2022   LDLCALC 52 08/21/2022   CREATININE 1.19 08/21/2022   Wt Readings from Last 3 Encounters:  11/27/22 160 lb (72.6 kg)  10/31/22 157 lb (71.2 kg)  08/21/22 153 lb 6.4 oz (69.6 kg)   Diet controlled.

## 2022-11-27 NOTE — Assessment & Plan Note (Signed)
Continues to follow with [facility].

## 2022-11-28 ENCOUNTER — Other Ambulatory Visit: Payer: Self-pay | Admitting: Family

## 2022-11-28 DIAGNOSIS — Z1231 Encounter for screening mammogram for malignant neoplasm of breast: Secondary | ICD-10-CM

## 2022-11-28 LAB — BASIC METABOLIC PANEL
BUN: 10 mg/dL (ref 6–23)
CO2: 27 meq/L (ref 19–32)
Calcium: 9.6 mg/dL (ref 8.4–10.5)
Chloride: 105 meq/L (ref 96–112)
Creatinine, Ser: 1.18 mg/dL (ref 0.40–1.20)
GFR: 48.8 mL/min — ABNORMAL LOW (ref >60.00)
Glucose, Bld: 86 mg/dL (ref 70–99)
Potassium: 3.8 meq/L (ref 3.5–5.1)
Sodium: 141 meq/L (ref 135–145)

## 2022-11-28 LAB — VITAMIN D 25 HYDROXY (VIT D DEFICIENCY, FRACTURES): VITD: 49.09 ng/mL (ref 30.00–100.00)

## 2022-11-28 LAB — HEMOGLOBIN A1C: Hgb A1c MFr Bld: 5.7 % (ref 4.6–6.5)

## 2022-11-28 MED ORDER — VITAMIN D3 75 MCG (3000 UT) PO TABS: 1.0000 | ORAL_TABLET | Freq: Every day | ORAL | Status: DC

## 2022-12-03 ENCOUNTER — Ambulatory Visit (HOSPITAL_BASED_OUTPATIENT_CLINIC_OR_DEPARTMENT_OTHER)
Admission: RE | Admit: 2022-12-03 | Discharge: 2022-12-03 | Disposition: A | Discharge status: Home / Self Care | Payer: Commercial Managed Care - PPO | Source: Ambulatory Visit | Attending: Cardiology | Admitting: Cardiology

## 2022-12-03 DIAGNOSIS — R011 Cardiac murmur, unspecified: Secondary | ICD-10-CM | POA: Diagnosis not present

## 2022-12-04 LAB — ECHOCARDIOGRAM COMPLETE
AR max vel: 2.25 cm2
AV Area VTI: 2.26 cm2
AV Area mean vel: 2.22 cm2
AV Mean grad: 5 mm[Hg]
AV Peak grad: 10.4 mm[Hg]
Ao pk vel: 1.61 m/s
Area-P 1/2: 3.99 cm2
Calc EF: 67.8 %
MV M vel: 3.85 m/s
MV Peak grad: 59.1 mm[Hg]
S' Lateral: 2.2 cm
Single Plane A2C EF: 70.7 %
Single Plane A4C EF: 66.1 %

## 2022-12-12 ENCOUNTER — Telehealth: Payer: Self-pay | Admitting: Family

## 2022-12-12 NOTE — Telephone Encounter (Signed)
Called patient to get additional information such as fax number. I also asked her if she wanted this emailed to her. She said she will call back in am with a direct fax number.

## 2022-12-12 NOTE — Telephone Encounter (Signed)
Pt called stating that she works for [employer] and needed to have her info for her flu shot sent over to record that she got it. 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

## 2022-12-13 NOTE — Telephone Encounter (Signed)
Pt called back and provided the fax number that Is : [phone].

## 2022-12-13 NOTE — Telephone Encounter (Signed)
Pt called to follow up on flu shot record being faxed to her job to show proof of vaccination. Able to get a CMA to print record off. Faxed to [phone]. Received success confirmation.

## 2022-12-31 ENCOUNTER — Other Ambulatory Visit (HOSPITAL_BASED_OUTPATIENT_CLINIC_OR_DEPARTMENT_OTHER): Payer: Self-pay

## 2023-01-02 ENCOUNTER — Ambulatory Visit
Admission: RE | Admit: 2023-01-02 | Discharge: 2023-01-02 | Disposition: A | Discharge status: Home / Self Care | Payer: Commercial Managed Care - PPO | Source: Ambulatory Visit | Attending: Family | Admitting: Family

## 2023-01-02 DIAGNOSIS — Z1231 Encounter for screening mammogram for malignant neoplasm of breast: Secondary | ICD-10-CM

## 2023-01-07 ENCOUNTER — Other Ambulatory Visit (HOSPITAL_BASED_OUTPATIENT_CLINIC_OR_DEPARTMENT_OTHER): Payer: Self-pay

## 2023-01-23 ENCOUNTER — Other Ambulatory Visit (HOSPITAL_BASED_OUTPATIENT_CLINIC_OR_DEPARTMENT_OTHER): Payer: Self-pay

## 2023-01-24 ENCOUNTER — Other Ambulatory Visit (HOSPITAL_BASED_OUTPATIENT_CLINIC_OR_DEPARTMENT_OTHER): Payer: Self-pay

## 2023-01-25 ENCOUNTER — Other Ambulatory Visit (HOSPITAL_BASED_OUTPATIENT_CLINIC_OR_DEPARTMENT_OTHER): Payer: Self-pay

## 2023-02-04 ENCOUNTER — Other Ambulatory Visit: Payer: Self-pay | Admitting: Family

## 2023-02-04 ENCOUNTER — Other Ambulatory Visit (HOSPITAL_BASED_OUTPATIENT_CLINIC_OR_DEPARTMENT_OTHER): Payer: Self-pay

## 2023-02-04 ENCOUNTER — Other Ambulatory Visit: Payer: Self-pay

## 2023-02-04 MED ORDER — ATORVASTATIN CALCIUM 20 MG PO TABS
20.0000 mg | ORAL_TABLET | Freq: Every day | ORAL | 0 refills | Status: DC
  Filled 2023-02-04 – 2023-02-18 (×2): qty 90, 90d supply, fill #0

## 2023-02-04 MED ORDER — AMLODIPINE BESYLATE-VALSARTAN 5-320 MG PO TABS
1.0000 | ORAL_TABLET | Freq: Every day | ORAL | 0 refills | Status: DC
  Filled 2023-02-04 – 2023-02-18 (×2): qty 90, 90d supply, fill #0

## 2023-02-11 ENCOUNTER — Other Ambulatory Visit (HOSPITAL_BASED_OUTPATIENT_CLINIC_OR_DEPARTMENT_OTHER): Payer: Self-pay

## 2023-02-15 ENCOUNTER — Other Ambulatory Visit (HOSPITAL_BASED_OUTPATIENT_CLINIC_OR_DEPARTMENT_OTHER): Payer: Self-pay

## 2023-02-18 ENCOUNTER — Other Ambulatory Visit: Payer: Self-pay

## 2023-02-18 ENCOUNTER — Other Ambulatory Visit (HOSPITAL_BASED_OUTPATIENT_CLINIC_OR_DEPARTMENT_OTHER): Payer: Self-pay

## 2023-02-21 ENCOUNTER — Other Ambulatory Visit (HOSPITAL_BASED_OUTPATIENT_CLINIC_OR_DEPARTMENT_OTHER): Payer: Self-pay

## 2023-02-21 ENCOUNTER — Ambulatory Visit: Payer: Self-pay | Admitting: Family

## 2023-02-21 ENCOUNTER — Other Ambulatory Visit: Payer: Self-pay

## 2023-02-21 MED ORDER — LAMOTRIGINE 100 MG PO TABS
100.0000 mg | ORAL_TABLET | Freq: Two times a day (BID) | ORAL | 0 refills | Status: DC
  Filled 2023-02-21: qty 180, 90d supply, fill #0

## 2023-02-21 MED ORDER — ARIPIPRAZOLE 10 MG PO TABS
10.0000 mg | ORAL_TABLET | Freq: Every day | ORAL | 0 refills | Status: DC
  Filled 2023-02-21: qty 90, 90d supply, fill #0

## 2023-02-21 MED ORDER — TRAZODONE HCL 100 MG PO TABS
100.0000 mg | ORAL_TABLET | Freq: Every evening | ORAL | 2 refills | Status: DC | PRN
  Filled 2023-02-21: qty 60, 30d supply, fill #0

## 2023-02-21 NOTE — Telephone Encounter (Signed)
  Chief Complaint: Dysuria, hematuria Symptoms: Dysuria, hematuria with wiping Frequency: Since Tuesday Pertinent Negatives: Patient denies fever, abd pain, N/V Disposition: [] ED /[] Urgent Care (no appt availability in office) / [x] Appointment(In office/virtual)/ []  [facility]/ [] Home Care/ [] Refused Recommended Disposition /[] [facility]/ []  Follow-up with PCP Additional Notes: Pt reports she has been experiencing dysuria, hematuria since Tuesday. She denies any fever, abd pain, N/V. Pt reports she notes scant amounts of bright red blood when wiping, no clotting. Pt reports she had some leftover Pyridium  that she began Tuesday and completed what was remaining with little to no improvement. OV scheduled tomorrow AM. This RN educated pt on new-worsening symptoms/when to call back or seek emergent care. Pt verbalized understanding and agrees to plan.   Copied from CRM [id number]. Topic: Clinical - Red Word Triage >> Feb 21, 2023 10:25 AM Kaitlyn Harper wrote: Red Word that prompted transfer to Nurse Triage: Patient called in stating she has a UTI and Blood in Urine Reason for Disposition  Pain or burning with passing urine  Answer Assessment - Initial Assessment Questions 1. COLOR of URINE: Describe the color of the urine.  (e.g., tea-colored, pink, red, bloody) Do you have blood clots in your urine? (e.g., none, pea, grape, small coin)     Cloudy, blood in urine, a little when I wipe 2. ONSET: When did the bleeding start?      Tuesday  4. PAIN with URINATION: Is there any pain with passing your urine? If Yes, ask: How bad is the pain?  (Scale 1-10; or mild, moderate, severe)    - MILD: Complains slightly about urination hurting.    - MODERATE: Interferes with normal activities.      - SEVERE: Excruciating, unwilling or unable to urinate because of the pain.      Mild 5. FEVER: Do you have a fever? If Yes, ask: What is your temperature, how was it measured, and  when did it start?     None 6. ASSOCIATED SYMPTOMS: Are you passing urine more frequently than usual?     None 7. OTHER SYMPTOMS: Do you have any other symptoms? (e.g., back/flank pain, abdomen pain, vomiting)     None  Protocols used: Urine - Blood In-A-AH

## 2023-02-22 ENCOUNTER — Encounter: Payer: Self-pay | Admitting: Family Medicine

## 2023-02-22 ENCOUNTER — Ambulatory Visit: Payer: Commercial Managed Care - PPO | Admitting: Family Medicine

## 2023-02-22 ENCOUNTER — Other Ambulatory Visit (HOSPITAL_BASED_OUTPATIENT_CLINIC_OR_DEPARTMENT_OTHER): Payer: Self-pay

## 2023-02-22 VITALS — BP 140/71 | HR 77 | Temp 98.1°F | Ht 64.0 in | Wt 166.0 lb

## 2023-02-22 DIAGNOSIS — R319 Hematuria, unspecified: Secondary | ICD-10-CM | POA: Diagnosis not present

## 2023-02-22 DIAGNOSIS — R300 Dysuria: Secondary | ICD-10-CM | POA: Diagnosis not present

## 2023-02-22 LAB — POC URINALSYSI DIPSTICK (AUTOMATED)
Bilirubin, UA: NEGATIVE
Glucose, UA: NEGATIVE
Ketones, UA: NEGATIVE
Nitrite, UA: NEGATIVE
Protein, UA: POSITIVE — AB
Spec Grav, UA: 1.01 (ref 1.010–1.025)
Urobilinogen, UA: 0.2 U/dL
pH, UA: 6 (ref 5.0–8.0)

## 2023-02-22 LAB — URINALYSIS, MICROSCOPIC ONLY

## 2023-02-22 MED ORDER — CEPHALEXIN 500 MG PO CAPS
500.0000 mg | ORAL_CAPSULE | Freq: Two times a day (BID) | ORAL | 0 refills | Status: AC
  Filled 2023-02-22: qty 14, 7d supply, fill #0

## 2023-02-22 NOTE — Progress Notes (Signed)
 Acute Office Visit  Subjective:     Patient ID: Kaitlyn Harper, female    DOB: [birth year], 63 y.o.   MRN: [id number]  Chief Complaint  Patient presents with   Hematuria     Patient is in today for UTI symptoms.   Discussed the use of AI scribe software for clinical note transcription with the patient, who gave verbal consent to proceed.  History of Present Illness   The patient, with a history of urinary symptoms, presents with a three-day history of cloudy urine and hematuria. They report a similar episode in 2023, which resolved. They report tenderness in the lower urinary tract but deny any back pain or increased urinary frequency. They also deny any systemic symptoms such as fever.       CrCl 57   All review of systems negative except what is listed in the HPI      Objective:    BP (!) 140/71   Pulse 77   Temp 98.1 F (36.7 C) (Oral)   Ht 5' 4 (1.626 m)   Wt 166 lb (75.3 kg)   LMP 12/25/2008   SpO2 100%   BMI 28.49 kg/m    Physical Exam Vitals reviewed.  Constitutional:      Appearance: Normal appearance.  Cardiovascular:     Rate and Rhythm: Normal rate and regular rhythm.  Abdominal:     General: Abdomen is flat. Bowel sounds are normal. There is no distension.     Palpations: Abdomen is soft.     Tenderness: There is no abdominal tenderness. There is no right CVA tenderness, left CVA tenderness or guarding.  Neurological:     Mental Status: She is alert and oriented to person, place, and time.  Psychiatric:        Mood and Affect: Mood normal.        Behavior: Behavior normal.        Thought Content: Thought content normal.        Judgment: Judgment normal.        Results for orders placed or performed in visit on 02/22/23  POCT Urinalysis Dipstick (Automated)  Result Value Ref Range   Color, UA yellow    Clarity, UA cloudy    Glucose, UA Negative Negative   Bilirubin, UA negative    Ketones, UA negative    Spec Grav, UA 1.010  1.010 - 1.025   Blood, UA large    pH, UA 6.0 5.0 - 8.0   Protein, UA Positive (A) Negative   Urobilinogen, UA 0.2 0.2 or 1.0 E.U./dL   Nitrite, UA negative    Leukocytes, UA Large (3+) (A) Negative        Assessment & Plan:   Problem List Items Addressed This Visit   None Visit Diagnoses       Dysuria    -  Primary   Relevant Medications   cephALEXin  (KEFLEX ) 500 MG capsule   Other Relevant Orders   POCT Urinalysis Dipstick (Automated) (Completed)   Urine Culture   Urine Microscopic Only     Hematuria, unspecified type       Relevant Medications   cephALEXin  (KEFLEX ) 500 MG capsule   Other Relevant Orders   POCT Urinalysis Dipstick (Automated) (Completed)   Urine Culture   Urine Microscopic Only      Hematuria and cloudy urine for 3 days. No dysuria, frequency, or flank pain. No fever. Discussed the plan to treat empirically and then reassess. Large leuks and hematuria  on UA. -Start Keflex . -Send urine for culture and sensitivity. -Recheck urine 2-3 weeks after completing antibiotics to ensure resolution of hematuria. -Refer to Urology if symptoms persist or if hematuria does not resolve.        Meds ordered this encounter  Medications   cephALEXin  (KEFLEX ) 500 MG capsule    Sig: Take 1 capsule (500 mg total) by mouth 2 (two) times daily for 7 days.    Dispense:  14 capsule    Refill:  0    Supervising Provider:   DOMENICA BLACKBIRD A [[id number]]    Return if symptoms worsen or fail to improve.  Waddell KATHEE Mon, NP

## 2023-02-25 LAB — URINE CULTURE
MICRO NUMBER:: 15943304
SPECIMEN QUALITY:: ADEQUATE

## 2023-02-25 NOTE — Addendum Note (Signed)
 Addended by: Hyman Hopes B on: 02/25/2023 08:27 AM   Modules accepted: Orders

## 2023-03-03 ENCOUNTER — Telehealth: Payer: Commercial Managed Care - PPO | Admitting: Physician Assistant

## 2023-03-03 DIAGNOSIS — H1030 Unspecified acute conjunctivitis, unspecified eye: Secondary | ICD-10-CM

## 2023-03-03 MED ORDER — OFLOXACIN 0.3 % OP SOLN
1.0000 [drp] | Freq: Four times a day (QID) | OPHTHALMIC | 0 refills | Status: DC
  Filled 2023-03-03: qty 5, 25d supply, fill #0

## 2023-03-03 NOTE — Progress Notes (Signed)
E-Visit for [employer]   We are sorry that you are not feeling well.  Here is how we plan to help!  Based on what you have shared with me it looks like you have conjunctivitis.  Conjunctivitis is a common inflammatory or infectious condition of the eye that is often referred to as "pink eye".  In most cases it is contagious (viral or bacterial). However, not all conjunctivitis requires antibiotics (ex. Allergic).  We have made appropriate suggestions for you based upon your presentation.  If no improvement I recommend follow up in person for further evaluation.   I have prescribed Oflaxacin 1-2 drops 4 times a day times 5 days   Pink eye can be highly contagious.  It is typically spread through direct contact with secretions, or contaminated objects or surfaces that one may have touched.  Strict handwashing is suggested with soap and water is urged.  If not available, use alcohol based had sanitizer.  Avoid unnecessary touching of the eye.  If you wear contact lenses, you will need to refrain from wearing them until you see no white discharge from the eye for at least 24 hours after being on medication.  You should see symptom improvement in 1-2 days after starting the medication regimen.  Call us if symptoms are not improved in 1-2 days.  Home Care: Wash your hands often! Do not wear your contacts until you complete your treatment plan. Avoid sharing towels, bed linen, personal items with a person who has pink eye. See attention for anyone in your home with similar symptoms.  Get Help Right Away If: Your symptoms do not improve. You develop blurred or loss of vision. Your symptoms worsen (increased discharge, pain or redness)   Thank you for choosing an e-visit.  Your e-visit answers were reviewed by a board certified advanced clinical practitioner to complete your personal care plan. Depending upon the condition, your plan could have included both over the counter or prescription  medications.  Please review your pharmacy choice. Make sure the pharmacy is open so you can pick up prescription now. If there is a problem, you may contact your provider through [employer] and have the prescription routed to another pharmacy.  Your safety is important to [place]. If you have drug allergies check your prescription carefully.   For the next 24 hours you can use MyChart to ask questions about today's visit, request a non-urgent call back, or ask for a work or school excuse. You will get an email in the next two days asking about your experience. I hope that your e-visit has been valuable and will speed your recovery.  I have spent 5 minutes in review of e-visit questionnaire, review and updating patient chart, medical decision making and response to patient.   Tylene Fantasia Ward, PA-C

## 2023-03-04 ENCOUNTER — Other Ambulatory Visit (HOSPITAL_BASED_OUTPATIENT_CLINIC_OR_DEPARTMENT_OTHER): Payer: Self-pay

## 2023-03-07 DIAGNOSIS — F3181 Bipolar II disorder: Secondary | ICD-10-CM | POA: Diagnosis not present

## 2023-03-08 ENCOUNTER — Other Ambulatory Visit: Payer: Commercial Managed Care - PPO

## 2023-03-08 DIAGNOSIS — R319 Hematuria, unspecified: Secondary | ICD-10-CM | POA: Diagnosis not present

## 2023-03-10 LAB — URINALYSIS W MICROSCOPIC + REFLEX CULTURE
Bilirubin Urine: NEGATIVE
Glucose, UA: NEGATIVE
Ketones, ur: NEGATIVE
Nitrites, Initial: NEGATIVE
Specific Gravity, Urine: 1.013 (ref 1.001–1.035)
WBC, UA: >=60 /[HPF] — AB (ref 0–5)
pH: 6.5 (ref 5.0–8.0)

## 2023-03-10 LAB — CULTURE INDICATED

## 2023-03-10 LAB — URINE CULTURE
MICRO NUMBER:: 15997716
SPECIMEN QUALITY:: ADEQUATE

## 2023-03-11 ENCOUNTER — Encounter: Payer: Self-pay | Admitting: Family Medicine

## 2023-03-11 ENCOUNTER — Other Ambulatory Visit (HOSPITAL_BASED_OUTPATIENT_CLINIC_OR_DEPARTMENT_OTHER): Payer: Self-pay

## 2023-03-11 MED ORDER — ARIPIPRAZOLE 10 MG PO TABS
10.0000 mg | ORAL_TABLET | Freq: Every day | ORAL | 1 refills | Status: DC
  Filled 2023-05-27: qty 90, 90d supply, fill #0
  Filled 2023-08-26: qty 90, 90d supply, fill #1

## 2023-03-11 MED ORDER — TRAZODONE HCL 100 MG PO TABS
100.0000 mg | ORAL_TABLET | Freq: Every evening | ORAL | 5 refills | Status: DC | PRN
  Filled 2023-07-01: qty 60, 30d supply, fill #0

## 2023-03-11 MED ORDER — LAMOTRIGINE 100 MG PO TABS
ORAL_TABLET | ORAL | 1 refills | Status: DC
  Filled 2023-03-11 – 2023-05-27 (×11): qty 180, 90d supply, fill #0
  Filled 2023-08-19: qty 180, 90d supply, fill #1

## 2023-03-11 NOTE — Addendum Note (Signed)
Addended by: Clayborne Dana on: 03/11/2023 04:34 PM   Modules accepted: Orders

## 2023-03-12 ENCOUNTER — Other Ambulatory Visit: Payer: Self-pay

## 2023-03-13 ENCOUNTER — Other Ambulatory Visit (INDEPENDENT_AMBULATORY_CARE_PROVIDER_SITE_OTHER): Payer: Commercial Managed Care - PPO

## 2023-03-13 ENCOUNTER — Other Ambulatory Visit (HOSPITAL_BASED_OUTPATIENT_CLINIC_OR_DEPARTMENT_OTHER): Payer: Self-pay

## 2023-03-13 DIAGNOSIS — R319 Hematuria, unspecified: Secondary | ICD-10-CM | POA: Diagnosis not present

## 2023-03-13 LAB — BASIC METABOLIC PANEL
BUN: 16 mg/dL (ref 6–23)
CO2: 28 meq/L (ref 19–32)
Calcium: 9.8 mg/dL (ref 8.4–10.5)
Chloride: 103 meq/L (ref 96–112)
Creatinine, Ser: 1.22 mg/dL — ABNORMAL HIGH (ref 0.40–1.20)
GFR: 46.79 mL/min — ABNORMAL LOW (ref >60.00)
Glucose, Bld: 116 mg/dL — ABNORMAL HIGH (ref 70–99)
Potassium: 3.4 meq/L — ABNORMAL LOW (ref 3.5–5.1)
Sodium: 139 meq/L (ref 135–145)

## 2023-03-14 ENCOUNTER — Other Ambulatory Visit (HOSPITAL_BASED_OUTPATIENT_CLINIC_OR_DEPARTMENT_OTHER): Payer: Self-pay

## 2023-03-14 LAB — PROTEIN / CREATININE RATIO, URINE
Creatinine, Urine: 155 mg/dL (ref 20–275)
Protein/Creat Ratio: 148 mg/g{creat} (ref 24–184)
Protein/Creatinine Ratio: 0.148 mg/mg{creat} (ref 0.024–0.184)
Total Protein, Urine: 23 mg/dL (ref 5–24)

## 2023-03-15 ENCOUNTER — Encounter: Payer: Self-pay | Admitting: Family Medicine

## 2023-03-15 ENCOUNTER — Other Ambulatory Visit (HOSPITAL_BASED_OUTPATIENT_CLINIC_OR_DEPARTMENT_OTHER): Payer: Self-pay

## 2023-03-15 DIAGNOSIS — N898 Other specified noninflammatory disorders of vagina: Secondary | ICD-10-CM | POA: Diagnosis not present

## 2023-03-15 LAB — URINALYSIS W MICROSCOPIC + REFLEX CULTURE
Bacteria, UA: NONE SEEN /[HPF]
Bilirubin Urine: NEGATIVE
Glucose, UA: NEGATIVE
Hgb urine dipstick: NEGATIVE
Hyaline Cast: NONE SEEN /[LPF]
Ketones, ur: NEGATIVE
Nitrites, Initial: NEGATIVE
Specific Gravity, Urine: 1.02 (ref 1.001–1.035)
pH: 5.5 (ref 5.0–8.0)

## 2023-03-15 LAB — URINE CULTURE
MICRO NUMBER:: 16016349
Result:: NO GROWTH
SPECIMEN QUALITY:: ADEQUATE

## 2023-03-15 LAB — CULTURE INDICATED

## 2023-03-18 ENCOUNTER — Other Ambulatory Visit (HOSPITAL_BASED_OUTPATIENT_CLINIC_OR_DEPARTMENT_OTHER): Payer: Self-pay

## 2023-03-18 MED ORDER — FLUCONAZOLE 150 MG PO TABS
150.0000 mg | ORAL_TABLET | Freq: Every day | ORAL | 1 refills | Status: DC
  Filled 2023-03-18: qty 2, 2d supply, fill #0
  Filled 2023-03-19: qty 2, 2d supply, fill #1

## 2023-03-18 MED ORDER — METRONIDAZOLE 500 MG PO TABS
2000.0000 mg | ORAL_TABLET | ORAL | 0 refills | Status: AC
  Filled 2023-03-18: qty 4, 1d supply, fill #0

## 2023-03-19 ENCOUNTER — Other Ambulatory Visit (HOSPITAL_BASED_OUTPATIENT_CLINIC_OR_DEPARTMENT_OTHER): Payer: Self-pay

## 2023-03-19 ENCOUNTER — Other Ambulatory Visit: Payer: Self-pay

## 2023-03-20 ENCOUNTER — Other Ambulatory Visit (HOSPITAL_BASED_OUTPATIENT_CLINIC_OR_DEPARTMENT_OTHER): Payer: Self-pay

## 2023-03-26 ENCOUNTER — Other Ambulatory Visit (HOSPITAL_BASED_OUTPATIENT_CLINIC_OR_DEPARTMENT_OTHER): Payer: Self-pay

## 2023-03-26 ENCOUNTER — Ambulatory Visit: Payer: Commercial Managed Care - PPO | Admitting: Family

## 2023-03-26 ENCOUNTER — Encounter: Payer: Self-pay | Admitting: Family

## 2023-03-26 VITALS — BP 158/85 | HR 92 | Temp 98.6°F | Resp 16 | Ht 64.0 in | Wt 166.0 lb

## 2023-03-26 DIAGNOSIS — I2510 Atherosclerotic heart disease of native coronary artery without angina pectoris: Secondary | ICD-10-CM

## 2023-03-26 DIAGNOSIS — Z720 Tobacco use: Secondary | ICD-10-CM

## 2023-03-26 DIAGNOSIS — N1831 Chronic kidney disease, stage 3a: Secondary | ICD-10-CM | POA: Diagnosis not present

## 2023-03-26 DIAGNOSIS — E782 Mixed hyperlipidemia: Secondary | ICD-10-CM | POA: Diagnosis not present

## 2023-03-26 DIAGNOSIS — F319 Bipolar disorder, unspecified: Secondary | ICD-10-CM | POA: Diagnosis not present

## 2023-03-26 DIAGNOSIS — I10 Essential (primary) hypertension: Secondary | ICD-10-CM | POA: Diagnosis not present

## 2023-03-26 MED ORDER — CARVEDILOL 6.25 MG PO TABS
6.2500 mg | ORAL_TABLET | Freq: Two times a day (BID) | ORAL | 3 refills | Status: DC
  Filled 2023-03-26 – 2023-04-09 (×3): qty 60, 30d supply, fill #0
  Filled 2023-05-02 – 2023-05-27 (×4): qty 60, 30d supply, fill #1
  Filled 2023-06-19: qty 60, 30d supply, fill #2
  Filled 2023-07-25: qty 60, 30d supply, fill #3

## 2023-03-26 NOTE — Assessment & Plan Note (Signed)
Management per [facility].  Lab Results  Component Value Date   CREATININE 1.22 (H) 03/13/2023

## 2023-03-26 NOTE — Assessment & Plan Note (Signed)
BP Readings from Last 3 Encounters:  03/26/23 (!) 180/82  02/22/23 (!) 140/71  11/27/22 120/88   Uncontrolled. Continue exforge, increase carvedilol from 3.125 to 6.5 mg bid.

## 2023-03-26 NOTE — Assessment & Plan Note (Signed)
Lab Results  Component Value Date   CHOL 104 08/21/2022   HDL 33.60 (L) 08/21/2022   LDLCALC 52 08/21/2022   TRIG 95.0 08/21/2022   CHOLHDL 3 08/21/2022   LDL at goal on lipitor.

## 2023-03-26 NOTE — Assessment & Plan Note (Signed)
Followed by cardiology. Continue beta blocker, statin and aspirin for secondary prevention as well as tobacco cessation.

## 2023-03-26 NOTE — Patient Instructions (Signed)
VISIT SUMMARY:  Today, we reviewed your elevated blood pressure readings and discussed your current medications and overall health. We also talked about your readiness to quit smoking and your ongoing management of diabetes, hyperlipidemia, and chronic kidney disease.  YOUR PLAN:  -HYPERTENSION: Hypertension means high blood pressure. We will increase your Carvedilol dose to 6.25 mg twice daily and recheck your blood pressure in 2 weeks.  -SMOKING CESSATION: You expressed a desire to quit smoking. Please consult with your psychiatrist about using Chantix to help you quit.  -CHRONIC KIDNEY DISEASE: Chronic kidney disease means your kidneys are not working as well as they should. Continue your current management with Valsartan for kidney protection.  -HYPERLIPIDEMIA: Hyperlipidemia means you have high levels of fats in your blood. Your LDL cholesterol is well controlled with Lipitor, so continue taking it as prescribed.  -DIABETES MELLITUS: Diabetes mellitus means you have high blood sugar levels. Your last A1c was 5.7, indicating good control. We will check it again at your next visit.  -GENERAL HEALTH MAINTENANCE: Continue taking your daily 81 mg aspirin and follow up with your cardiologist. Return to the clinic in 2 weeks for a blood pressure check and possible medication adjustment.  INSTRUCTIONS:  Please return to the clinic in 2 weeks for a blood pressure check and potential medication adjustment. Consult with your psychiatrist about using Chantix for smoking cessation.

## 2023-03-26 NOTE — Assessment & Plan Note (Signed)
Reports stable mood, management per psychiatyr.

## 2023-03-26 NOTE — Progress Notes (Signed)
Subjective:     Patient ID: Kaitlyn Harper, female    DOB: 1960/06/10, 63 y.o.   MRN: [id number]  Chief Complaint  Patient presents with   Hypertension    Here for follow up   Diabetes    Here for follow up      Discussed the use of AI scribe software for clinical note transcription with the patient, who gave verbal consent to proceed.  History of Present Illness   Gerry A [place] "[name]" is a 63 year old female with hypertension who presents with elevated blood pressure readings.  She is here for a follow-up regarding her hypertension. Her blood pressure has been consistently elevated, with systolic readings typically ranging from 148 to 160 mmHg. There have been no recent changes to her medications or diet. Her current antihypertensive regimen includes Exforge 5-320 mg and carvedilol, which was recently adjusted. She regularly monitors her blood pressure at home.  She experiences some swelling, which she describes as minor and self-resolving. She is also on Lasix, which she has used previously for swelling.  She has a history of diabetes, with a recent urinalysis showing a small amount of proteinuria, consistent with her diabetes history. Her last hemoglobin A1c was 5.7, indicating good glycemic control. She is not due for another blood draw today.  She is taking Lipitor for hyperlipidemia management, with her last cholesterol check showing an LDL of 52 mg/dL, which is considered optimal. She also takes an 81 mg aspirin daily.  Socially, she continues to smoke, although she reports a low frequency. She is considering smoking cessation and plans to discuss this with her psychiatrist.  She is up to date with her cardiology and nephrology appointments, and her recent kidney function tests were satisfactory.          Health Maintenance Due  Topic Date Due   Pneumococcal Vaccine 74-53 Years old (2 of 2 - PCV) 08/27/2012   COVID-19 Vaccine (6 - 2024-25 season) 10/14/2022     Past Medical History:  Diagnosis Date   Anemia    resolved    Anxiety    Bipolar disorder (HCC)    Chronic kidney disease stage 3    Depression    Diabetes mellitus without complication (HCC)    type 2-diet controlled   Diverticulitis    History of diverticulitis 12/17/2018   Hyperglycemia    Hyperlipidemia    Hypertension    NSTEMI (non-ST elevated myocardial infarction) (HCC) 2018   reports she fainted at his sons house and they took her to ED  , troponin was elevated and cardiac cath was completed,  no stent placed nor hx of stent intervention , she reports no casue was ever determined for the sycope episode    Syncope 03/18/2016   reports no recurrence     Past Surgical History:  Procedure Laterality Date   APPENDECTOMY  1985   BREAST BIOPSY Right 01/13/2014   benign   CHOLECYSTECTOMY  1985   COLONOSCOPY W/ POLYPECTOMY  03/01/2015   Dr Wendall Papa, [facility].  +adenomatous polyps   COLONOSCOPY W/ POLYPECTOMY  02/28/2011   4 polyps - 3 TA   DILATION AND CURETTAGE OF UTERUS  2005   HEMORRHOID SURGERY  2002   KNEE SURGERY  1983   arthroscopy?   LEFT HEART CATH AND CORONARY ANGIOGRAPHY N/A 03/19/2016   Procedure: Left Heart Cath and Coronary Angiography;  Surgeon: Peter M Swazil[place], MD;  Location: [facility] INVASIVE CV LAB;  Service: Cardiovascular;  Laterality: N/A;   MOUTH SURGERY  03/08/2016   POLYPECTOMY  2006   ?anal polyp?   TEAR DUCT PROBING  2004   TUBAL LIGATION  1985    Family History  Problem Relation Age of Onset   Hypertension Father    Sarcoidosis Brother    Alcohol abuse Brother    Cirrhosis Brother    Stroke Maternal Grandmother    Colon cancer Neg Hx    Colon polyps Neg Hx    Esophageal cancer Neg Hx    Rectal cancer Neg Hx    Stomach cancer Neg Hx    Pulmonary embolism Neg Hx     Social History   Socioeconomic History   Marital status: Single    Spouse name: Not on file   Number of children: 3   Years of education: Not on file   Highest  education level: Some college, no degree  Occupational History   Occupation: Armed forces operational officer:  [employer]  Tobacco Use   Smoking status: Some Days    Current packs/day: 0.00    Types: Cigarettes    Last attempt to quit: 07/02/2015    Years since quitting: 7.7   Smokeless tobacco: Never   Tobacco comments:    10 cigarettes daily; 10-30 reports " one to two cigarettes every now and again"     10/31/22 Patient states that she smokes about a pack of cigarettes a week.   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/2023)   Overall Financial Resource Strain (CARDIA)    Difficulty of Paying Living Expenses: Not very hard  Food Insecurity: No Food Insecurity (02/21/2023)   Hunger Vital Sign    Worried About Running Out of Food in the Last Year: Never true    Ran Out of Food in the Last Year: Never true  Transportation Needs: No Transportation Needs (02/21/2023)   PRAPARE - Administrator, Civil Service (Medical): No    Lack of Transportation (Non-Medical): No  Physical Activity: Inactive (02/21/2023)   Exercise Vital Sign    Days of Exercise per Week: 2 days    Minutes of Exercise per Session: 0 min  Stress: No Stress Concern Present (02/21/2023)   [employer] of Occupational Health - Occupational Stress Questionnaire    Feeling of Stress : Not at all  Social Connections: Moderately Integrated (02/21/2023)   Social Connection and Isolation Panel [NHANES]    Frequency of Communication  with Friends and Family: More than three times a week    Frequency of Social Gatherings with Friends and Family: Once a week    Attends Religious Services: More than 4 times per year    Active Member of [employer] or Organizations: Yes    Attends Banker Meetings: 1 to 4 times per year    Marital Status: Divorced  Catering manager Violence:  Not on file    Outpatient Medications Prior to Visit  Medication Sig Dispense Refill   amLODipine-valsartan (EXFORGE) 5-320 MG tablet Take 1 tablet by mouth daily. 90 tablet 0   ARIPiprazole (ABILIFY) 10 MG tablet Take 1 tablet (10 mg total) by mouth daily. 90 tablet 1   aspirin EC 81 MG tablet Take 81 mg by mouth daily.     atorvastatin (LIPITOR) 20 MG tablet Take 1 tablet (20 mg total) by mouth daily. 90 tablet 0   Cholecalciferol (VITAMIN D3) 75 MCG (3000 UT) TABS Take 1 tablet by mouth daily at 6 (six) AM.     lamoTRIgine (LAMICTAL) 100 MG tablet Take 1 tablet (100 mg total) by mouth every morning AND 1 tablet (100 mg total) at bedtime. 180 tablet 1   psyllium (METAMUCIL) 58.6 % powder Take 1 packet by mouth 3 (three) times daily.     SODIUM FLUORIDE, DENTAL RINSE, (PREVIDENT) 0.2 % SOLN USE AS AN ORAL RINSE, AS DIRECTED ON PACKAGE. 473 mL 5   traZODone (DESYREL) 100 MG tablet Take 1-2 tablets (100-200 mg total) by mouth at bedtime as needed for sleep. 60 tablet 2   traZODone (DESYREL) 100 MG tablet Take 1-2 tablets (100-200 mg total) by mouth at bedtime as needed for sleep 60 tablet 5   carvedilol (COREG) 3.125 MG tablet Take 1 tablet (3.125 mg total) by mouth 2 (two) times daily with a meal. 180 tablet 3   fluconazole (DIFLUCAN) 150 MG tablet Take 1 tablet (150 mg) by mouth today, then repeat in 3 days. 2 tablet 1   ofloxacin (OCUFLOX) 0.3 % ophthalmic solution Place 1 drop into both eyes 4 (four) times daily for 5 days. 5 mL 0   No facility-administered medications prior to visit.    Allergies  Allergen Reactions   Sulfonamide  Derivatives Hives and Other (See Comments)    Light sensitivity    ROS See HPI    Objective:    Physical Exam Constitutional:      General: She is not in acute distress.    Appearance: Normal appearance. She is well-developed.  HENT:     Head: Normocephalic and atraumatic.     Right Ear: External ear normal.     Left Ear: External ear normal.  Eyes:     General: No scleral icterus. Neck:     Thyroid: No thyromegaly.  Cardiovascular:     Rate and Rhythm: Normal rate and regular rhythm.     Heart sounds: Normal heart sounds. No murmur heard. Pulmonary:     Effort: Pulmonary effort is normal. No respiratory distress.     Breath sounds: Normal breath sounds. No wheezing.  Musculoskeletal:     Cervical back: Neck supple.  Skin:    General: Skin is warm and dry.  Neurological:     Mental Status: She is alert and oriented to person, place, and time.  Psychiatric:        Mood and Affect: Mood normal.        Behavior: Behavior normal.        Thought Content: Thought content normal.        Judgment: Judgment normal.      BP (!) 158/85   Pulse 92   Temp 98.6 F (37 C) (Oral)   Resp 16   Ht 5\' 4"  (1.626 m)   Wt 166 lb (75.3 kg)   LMP 12/25/2008   SpO2 97%   BMI 28.49 kg/m  Wt Readings from Last 3  Encounters:  03/26/23 166 lb (75.3 kg)  02/22/23 166 lb (75.3 kg)  11/27/22 160 lb (72.6 kg)       Assessment & Plan:   Problem List Items Addressed This Visit       Unprioritized   Hyperlipidemia (Chronic)   Lab Results  Component Value Date   CHOL 104 08/21/2022   HDL 33.60 (L) 08/21/2022   LDLCALC 52 08/21/2022   TRIG 95.0 08/21/2022   CHOLHDL 3 08/21/2022   LDL at goal on lipitor.       Relevant Medications   carvedilol (COREG) 6.25 MG tablet   Tobacco abuse   Pt counseled on the importance of quitting smoking.       Essential hypertension   BP Readings from Last 3 Encounters:  03/26/23 (!) 180/82  02/22/23 (!) 140/71  11/27/22 120/88    Uncontrolled. Continue exforge, increase carvedilol from 3.125 to 6.5 mg bid.       Relevant Medications   carvedilol (COREG) 6.25 MG tablet   CKD stage 3a, GFR 45-59 ml/min (HCC)   Management per [facility].  Lab Results  Component Value Date   CREATININE 1.22 (H) 03/13/2023         Bipolar affective disorder, remission status unspecified (HCC) - Primary   Reports stable mood, management per psychiatyr.        I have discontinued Teandra A. Gray "Gwen"'s carvedilol, ofloxacin, and fluconazole. I am also having her start on carvedilol. Additionally, I am having her maintain her aspirin EC, psyllium, PreviDent, Vitamin D3, amLODipine-valsartan, atorvastatin, traZODone, ARIPiprazole, lamoTRIgine, and traZODone.  Meds ordered this encounter  Medications   carvedilol (COREG) 6.25 MG tablet    Sig: Take 1 tablet (6.25 mg total) by mouth 2 (two) times daily with a meal.    Dispense:  60 tablet    Refill:  3    Supervising Provider:   Danise Edge A [[id number]]

## 2023-03-26 NOTE — Assessment & Plan Note (Signed)
Pt counseled on the importance of quitting smoking.

## 2023-03-27 ENCOUNTER — Other Ambulatory Visit (HOSPITAL_BASED_OUTPATIENT_CLINIC_OR_DEPARTMENT_OTHER): Payer: Self-pay

## 2023-04-08 ENCOUNTER — Other Ambulatory Visit (HOSPITAL_BASED_OUTPATIENT_CLINIC_OR_DEPARTMENT_OTHER): Payer: Self-pay

## 2023-04-09 ENCOUNTER — Ambulatory Visit: Payer: Commercial Managed Care - PPO | Admitting: Family

## 2023-04-09 ENCOUNTER — Other Ambulatory Visit (HOSPITAL_BASED_OUTPATIENT_CLINIC_OR_DEPARTMENT_OTHER): Payer: Self-pay

## 2023-04-09 VITALS — BP 160/84 | HR 78 | Temp 98.7°F | Resp 16 | Ht 64.0 in | Wt 166.0 lb

## 2023-04-09 DIAGNOSIS — I10 Essential (primary) hypertension: Secondary | ICD-10-CM

## 2023-04-09 DIAGNOSIS — N1831 Chronic kidney disease, stage 3a: Secondary | ICD-10-CM | POA: Diagnosis not present

## 2023-04-09 DIAGNOSIS — E876 Hypokalemia: Secondary | ICD-10-CM | POA: Diagnosis not present

## 2023-04-09 MED ORDER — POTASSIUM CHLORIDE CRYS ER 10 MEQ PO TBCR
10.0000 meq | EXTENDED_RELEASE_TABLET | Freq: Two times a day (BID) | ORAL | 1 refills | Status: DC
  Filled 2023-04-09: qty 180, 90d supply, fill #0
  Filled 2023-07-01: qty 180, 90d supply, fill #1

## 2023-04-09 MED ORDER — HYDROCHLOROTHIAZIDE 25 MG PO TABS
25.0000 mg | ORAL_TABLET | Freq: Every day | ORAL | 1 refills | Status: DC
  Filled 2023-04-09: qty 90, 90d supply, fill #0
  Filled 2023-07-01: qty 90, 90d supply, fill #1

## 2023-04-09 NOTE — Patient Instructions (Signed)
 VISIT SUMMARY:  Today, we discussed your ongoing issue with uncontrolled blood pressure despite recent adjustments to your medication. We reviewed your current medications and their effects, including the side effects you experienced with higher doses of amlodipine. Your blood pressure reading today was 160/84 mmHg.  YOUR PLAN:  -HYPERTENSION: Hypertension means high blood pressure, which can lead to serious health problems if not controlled. We will start you on Hydrochlorothiazide and Potassium supplements to help manage your blood pressure. Please check your blood pressure at home and report the reading on 04/12/2023. Depending on your home readings, we may consider increasing your Carvedilol dose. We will have a follow-up appointment in one week to recheck your blood pressure and potassium levels.  INSTRUCTIONS:  Please check your blood pressure at home and report the reading on 04/12/2023. We will have a follow-up appointment in one week to recheck your blood pressure and potassium levels.

## 2023-04-09 NOTE — Assessment & Plan Note (Signed)
 Pt to keep upcoming appointment with nephrology.

## 2023-04-09 NOTE — Assessment & Plan Note (Signed)
 Uncontrolled despite increase in carvedilol.  Uncontrolled despite Carvedilol 6.25mg  BID and Exforge 5mg . Patient has a history of edema with higher doses of amlodipine. -Start Hydrochlorothiazide and Potassium supplementation. -Check blood pressure at home and report reading on 04/12/2023. -Consider increasing Carvedilol dose based on home blood pressure reading. -Follow-up appointment in 1 week to recheck blood pressure and potassium levels.

## 2023-04-09 NOTE — Progress Notes (Signed)
 Subjective:     Patient ID: Kaitlyn Harper, female    DOB: October 25, 1960, 63 y.o.   MRN: [id number]  Chief Complaint  Patient presents with   Hypertension    Here for follow up    Hypertension    Discussed the use of AI scribe software for clinical note transcription with the patient, who gave verbal consent to proceed.  History of Present Illness  Kaitlyn Harper "[name]" is a 63 year old female with hypertension who presents with uncontrolled blood pressure despite medication adjustments.  She is experiencing uncontrolled blood pressure despite recent adjustments to her medication regimen. Her carvedilol dose was increased from 3.125 mg twice daily to 6.25 mg twice daily, but this change has not been effective in lowering her blood pressure. Her current blood pressure reading is 160/84 mmHg.  She recalls experiencing ankle swelling when on a higher dose of Exforge, which contains amlodipine. She is currently on 5 mg of amlodipine, and she notes that increasing the dose to 10 mg previously led to significant ankle swelling.  She is also taking Lasix. She has previously taken potassium supplements, which she describes as 'big' and has tried in liquid form, noting the taste is unpleasant. She prefers to take two smaller potassium pills if needed.     Health Maintenance Due  Topic Date Due   Pneumococcal Vaccine 75-9 Years old (2 of 2 - PCV) 08/27/2012   COVID-19 Vaccine (6 - 2024-25 season) 10/14/2022    Past Medical History:  Diagnosis Date   Anemia    resolved    Anxiety    Bipolar disorder (HCC)    Chronic kidney disease stage 3    Depression    Diabetes mellitus without complication (HCC)    type 2-diet controlled   Diverticulitis    History of diverticulitis 12/17/2018   Hyperglycemia    Hyperlipidemia    Hypertension    NSTEMI (non-ST elevated myocardial infarction) (HCC) 2018   reports she fainted at his sons house and they took her to ED  , troponin was  elevated and cardiac cath was completed,  no stent placed nor hx of stent intervention , she reports no casue was ever determined for the sycope episode    Syncope 03/18/2016   reports no recurrence     Past Surgical History:  Procedure Laterality Date   APPENDECTOMY  1985   BREAST BIOPSY Right 01/13/2014   benign   CHOLECYSTECTOMY  1985   COLONOSCOPY W/ POLYPECTOMY  03/01/2015   Dr Wendall Papa, [facility].  +adenomatous polyps   COLONOSCOPY W/ POLYPECTOMY  02/28/2011   4 polyps - 3 TA   DILATION AND CURETTAGE OF UTERUS  2005   HEMORRHOID SURGERY  2002   KNEE SURGERY  1983   arthroscopy?   LEFT HEART CATH AND CORONARY ANGIOGRAPHY N/A 03/19/2016   Procedure: Left Heart Cath and Coronary Angiography;  Surgeon: Peter M Swaziland, MD;  Location: [facility] INVASIVE CV LAB;  Service: Cardiovascular;  Laterality: N/A;   MOUTH SURGERY  03/08/2016   POLYPECTOMY  2006   ?anal polyp?   TEAR DUCT PROBING  2004   TUBAL LIGATION  1985    Family History  Problem Relation Age of Onset   Hypertension Father    Sarcoidosis Brother    Alcohol abuse Brother    Cirrhosis Brother    Stroke Maternal Grandmother    Colon cancer Neg Hx    Colon polyps Neg Hx    Esophageal cancer  Neg Hx    Rectal cancer Neg Hx    Stomach cancer Neg Hx    Pulmonary embolism Neg Hx     Social History   Socioeconomic History   Marital status: Single    Spouse name: Not on file   Number of children: 3   Years of education: Not on file   Highest education level: Some college, no degree  Occupational History   Occupation: Armed forces operational officer: [employer]  Tobacco Use   Smoking status: Some Days    Current packs/day: 0.00    Types: Cigarettes    Last attempt to quit: 07/02/2015    Years since quitting: 7.7   Smokeless tobacco: Never   Tobacco comments:    10 cigarettes daily; 10-30 reports " one to two cigarettes every now and again"     10/31/22 Patient states that she smokes about a pack of cigarettes a  week.   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/2023)   Overall Financial Resource Strain (CARDIA)    Difficulty of Paying Living Expenses: Not very hard  Food Insecurity: No Food Insecurity (02/21/2023)   Hunger Vital Sign    Worried About Running Out of Food in the Last Year: Never true    Ran Out of Food in the Last Year: Never true  Transportation Needs: No Transportation Needs (02/21/2023)   PRAPARE - Administrator, Civil Service (Medical): No    Lack of Transportation (Non-Medical): No  Physical Activity: Inactive (02/21/2023)   Exercise Vital Sign    Days of Exercise per Week: 2 days    Minutes of Exercise per Session: 0 min  Stress: No Stress Concern Present (02/21/2023)   [employer] of Occupational Health - Occupational Stress Questionnaire    Feeling of Stress : Not at all  Social Connections: Moderately Integrated (02/21/2023)   Social Connection and Isolation Panel [NHANES]    Frequency of Communication with Friends and Family: More than three times a week    Frequency of Social Gatherings with Friends and Family: Once a week    Attends Religious Services: More than 4 times per year    Active Member of [employer] or Organizations: Yes    Attends Banker Meetings: 1 to 4 times per year    Marital Status: Divorced  Catering manager Violence: Not on file    Outpatient Medications Prior to Visit  Medication Sig Dispense Refill   amLODipine-valsartan (EXFORGE) 5-320 MG  tablet Take 1 tablet by mouth daily. 90 tablet 0   ARIPiprazole (ABILIFY) 10 MG tablet Take 1 tablet (10 mg total) by mouth daily. 90 tablet 1   aspirin EC 81 MG tablet Take 81 mg by mouth daily.     atorvastatin (LIPITOR) 20 MG tablet Take 1 tablet (20 mg total) by mouth daily. 90 tablet 0  carvedilol (COREG) 6.25 MG tablet Take 1 tablet (6.25 mg total) by mouth 2 (two) times daily with a meal. 60 tablet 3   Cholecalciferol (VITAMIN D3) 75 MCG (3000 UT) TABS Take 1 tablet by mouth daily at 6 (six) AM.     lamoTRIgine (LAMICTAL) 100 MG tablet Take 1 tablet (100 mg total) by mouth every morning AND 1 tablet (100 mg total) at bedtime. 180 tablet 1   psyllium (METAMUCIL) 58.6 % powder Take 1 packet by mouth 3 (three) times daily.     SODIUM FLUORIDE, DENTAL RINSE, (PREVIDENT) 0.2 % SOLN USE AS AN ORAL RINSE, AS DIRECTED ON PACKAGE. 473 mL 5   traZODone (DESYREL) 100 MG tablet Take 1-2 tablets (100-200 mg total) by mouth at bedtime as needed for sleep. 60 tablet 2   traZODone (DESYREL) 100 MG tablet Take 1-2 tablets (100-200 mg total) by mouth at bedtime as needed for sleep 60 tablet 5   No facility-administered medications prior to visit.    Allergies  Allergen Reactions   Sulfonamide Derivatives Hives and Other (See Comments)    Light sensitivity    ROS See HPI    Objective:    Physical Exam Constitutional:      General: She is not in acute distress.    Appearance: Normal appearance. She is well-developed.  HENT:     Head: Normocephalic and atraumatic.     Right Ear: External ear normal.     Left Ear: External ear normal.  Eyes:     General: No scleral icterus. Neck:     Thyroid: No thyromegaly.  Cardiovascular:     Rate and Rhythm: Normal rate and regular rhythm.     Heart sounds: Normal heart sounds. No murmur heard. Pulmonary:     Effort: Pulmonary effort is normal. No respiratory distress.     Breath sounds: Normal breath sounds. No wheezing.  Musculoskeletal:         General: No swelling.     Cervical back: Neck supple.  Skin:    General: Skin is warm and dry.  Neurological:     Mental Status: She is alert and oriented to person, place, and time.  Psychiatric:        Mood and Affect: Mood normal.        Behavior: Behavior normal.        Thought Content: Thought content normal.        Judgment: Judgment normal.      BP (!) 160/84   Pulse 78   Temp 98.7 F (37.1 C) (Oral)   Resp 16   Ht 5\' 4"  (1.626 m)   Wt 166 lb (75.3 kg)   LMP 12/25/2008   SpO2 100%   BMI 28.49 kg/m  Wt Readings from Last 3 Encounters:  04/09/23 166 lb (75.3 kg)  03/26/23 166 lb (75.3 kg)  02/22/23 166 lb (75.3 kg)       Assessment & Plan:   Problem List Items Addressed This Visit       Unprioritized   Essential hypertension   Uncontrolled despite increase in carvedilol.  Uncontrolled despite Carvedilol 6.25mg  BID and Exforge 5mg . Patient has a history of edema with higher doses of amlodipine. -Start Hydrochlorothiazide and Potassium supplementation. -Check blood pressure at home and report reading on 04/12/2023. -Consider increasing Carvedilol dose based on home blood pressure reading. -Follow-up appointment in 1 week to recheck blood pressure and potassium levels.      Relevant Medications   hydrochlorothiazide (HYDRODIURIL) 25 MG  tablet   CKD stage 3a, GFR 45-59 ml/min (HCC)   Pt to keep upcoming appointment with nephrology.       Other Visit Diagnoses       Hypokalemia    -  Primary   Relevant Medications   potassium chloride (KLOR-CON M) 10 MEQ tablet       I am having Tessy A. Brunetti "Gwen" start on hydrochlorothiazide and potassium chloride. I am also having her maintain her aspirin EC, psyllium, PreviDent, Vitamin D3, amLODipine-valsartan, atorvastatin, traZODone, ARIPiprazole, lamoTRIgine, traZODone, and carvedilol.  Meds ordered this encounter  Medications   hydrochlorothiazide (HYDRODIURIL) 25 MG tablet    Sig: Take 1 tablet (25  mg total) by mouth daily.    Dispense:  90 tablet    Refill:  1    Supervising Provider:   Danise Edge A [[id number]]   potassium chloride (KLOR-CON M) 10 MEQ tablet    Sig: Take 1 tablet (10 mEq total) by mouth 2 (two) times daily.    Dispense:  180 tablet    Refill:  1    Supervising Provider:   Danise Edge A [[id number]]

## 2023-04-13 ENCOUNTER — Encounter: Payer: Self-pay | Admitting: Family

## 2023-04-17 ENCOUNTER — Other Ambulatory Visit (HOSPITAL_BASED_OUTPATIENT_CLINIC_OR_DEPARTMENT_OTHER): Payer: Self-pay

## 2023-04-17 ENCOUNTER — Ambulatory Visit: Payer: Commercial Managed Care - PPO | Admitting: Family

## 2023-04-17 VITALS — BP 165/84 | HR 86 | Temp 98.4°F | Resp 16 | Ht 64.0 in | Wt 169.0 lb

## 2023-04-17 DIAGNOSIS — E1122 Type 2 diabetes mellitus with diabetic chronic kidney disease: Secondary | ICD-10-CM | POA: Diagnosis not present

## 2023-04-17 DIAGNOSIS — I10 Essential (primary) hypertension: Secondary | ICD-10-CM | POA: Diagnosis not present

## 2023-04-17 DIAGNOSIS — N183 Chronic kidney disease, stage 3 unspecified: Secondary | ICD-10-CM | POA: Diagnosis not present

## 2023-04-17 DIAGNOSIS — N3001 Acute cystitis with hematuria: Secondary | ICD-10-CM | POA: Diagnosis not present

## 2023-04-17 LAB — POC URINALSYSI DIPSTICK (AUTOMATED)
Bilirubin, UA: NEGATIVE
Glucose, UA: NEGATIVE
Ketones, UA: NEGATIVE
Nitrite, UA: NEGATIVE
Protein, UA: POSITIVE — AB
Spec Grav, UA: 1.01 (ref 1.010–1.025)
Urobilinogen, UA: 0.2 U/dL
pH, UA: 7 (ref 5.0–8.0)

## 2023-04-17 MED ORDER — VALSARTAN 320 MG PO TABS
320.0000 mg | ORAL_TABLET | Freq: Every day | ORAL | 0 refills | Status: DC
  Filled 2023-04-17: qty 90, 90d supply, fill #0

## 2023-04-17 MED ORDER — CEPHALEXIN 500 MG PO CAPS
500.0000 mg | ORAL_CAPSULE | Freq: Two times a day (BID) | ORAL | 0 refills | Status: DC
  Filled 2023-04-17: qty 10, 5d supply, fill #0

## 2023-04-17 MED ORDER — AMLODIPINE-VALSARTAN-HCTZ 10-320-25 MG PO TABS
1.0000 | ORAL_TABLET | Freq: Every day | ORAL | 2 refills | Status: DC
  Filled 2023-04-17: qty 30, 30d supply, fill #0

## 2023-04-17 MED ORDER — AMLODIPINE BESYLATE 10 MG PO TABS
10.0000 mg | ORAL_TABLET | Freq: Every day | ORAL | 0 refills | Status: DC
  Filled 2023-04-17: qty 90, 90d supply, fill #0

## 2023-04-17 NOTE — Progress Notes (Unsigned)
 Subjective:     Patient ID: Kaitlyn Harper, female    DOB: 1960/04/03, 63 y.o.   MRN: [id number]  Chief Complaint  Patient presents with   Hypertension    Here for follow up    HPI  Discussed the use of AI scribe software for clinical note transcription with the patient, who gave verbal consent to proceed.  History of Present Illness  Kaitlyn Harper "Kaitlyn Harper" is a 63 year old female with hypertension who presents for follow-up on blood pressure and urinary symptoms.  She experiences new onset urinary symptoms that began this morning, including foul-smelling and cloudy urine, with hematuria. No dysuria is present, but she describes experiencing spasms post-micturition. She has a known allergy to sulfa drugs.  She is here for a follow-up on her blood pressure management. She was previously started on hydrochlorothiazide, which has resulted in a reduction of her blood pressure from 160/84 mmHg to 145/68 mmHg. She is currently taking hydrochlorothiazide and a potassium supplement to counteract potential hypokalemia from the diuretic. BP Readings from Last 3 Encounters:  04/17/23 (!) 165/84  04/09/23 (!) 160/84  03/26/23 (!) 158/85   Lab Results  Component Value Date   HGBA1C 5.9 04/17/2023        Health Maintenance Due  Topic Date Due   Pneumococcal Vaccine 76-75 Years old (2 of 2 - PCV) 08/27/2012   COVID-19 Vaccine (6 - 2024-25 season) 10/14/2022    Past Medical History:  Diagnosis Date   Anemia    resolved    Anxiety    Bipolar disorder (HCC)    Chronic kidney disease stage 3    Depression    Diabetes mellitus without complication (HCC)    type 2-diet controlled   Diverticulitis    History of diverticulitis 12/17/2018   Hyperglycemia    Hyperlipidemia    Hypertension    NSTEMI (non-ST elevated myocardial infarction) (HCC) 2018   reports she fainted at his sons house and they took her to ED  , troponin was elevated and cardiac cath was completed,  no stent  placed nor hx of stent intervention , she reports no casue was ever determined for the sycope episode    Syncope 03/18/2016   reports no recurrence     Past Surgical History:  Procedure Laterality Date   APPENDECTOMY  1985   BREAST BIOPSY Right 01/13/2014   benign   CHOLECYSTECTOMY  1985   COLONOSCOPY W/ POLYPECTOMY  03/01/2015   Dr Wendall Papa, [facility].  +adenomatous polyps   COLONOSCOPY W/ POLYPECTOMY  02/28/2011   4 polyps - 3 TA   DILATION AND CURETTAGE OF UTERUS  2005   HEMORRHOID SURGERY  2002   KNEE SURGERY  1983   arthroscopy?   LEFT HEART CATH AND CORONARY ANGIOGRAPHY N/A 03/19/2016   Procedure: Left Heart Cath and Coronary Angiography;  Surgeon: Peter M Swaziland, MD;  Location: [facility] INVASIVE CV LAB;  Service: Cardiovascular;  Laterality: N/A;   MOUTH SURGERY  03/08/2016   POLYPECTOMY  2006   ?anal polyp?   TEAR DUCT PROBING  2004   TUBAL LIGATION  1985    Family History  Problem Relation Age of Onset   Hypertension Father    Sarcoidosis Brother    Alcohol abuse Brother    Cirrhosis Brother    Stroke Maternal Grandmother    Colon cancer Neg Hx    Colon polyps Neg Hx    Esophageal cancer Neg Hx    Rectal cancer Neg  Hx    Stomach cancer Neg Hx    Pulmonary embolism Neg Hx     Social History   Socioeconomic History   Marital status: Single    Spouse name: Not on file   Number of children: 3   Years of education: Not on file   Highest education level: Some college, no degree  Occupational History   Occupation: Armed forces operational officer: [employer]  Tobacco Use   Smoking status: Some Days    Current packs/day: 0.00    Types: Cigarettes    Last attempt to quit: 07/02/2015    Years since quitting: 7.8   Smokeless tobacco: Never   Tobacco comments:    10 cigarettes daily; 10-30 reports " one to two cigarettes every now and again"     10/31/22 Patient states that she smokes about a pack of cigarettes a week.   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)   Overall Financial Resource Strain (CARDIA)    Difficulty of Paying Living Expenses: Not very hard  Food Insecurity: No Food Insecurity (02/21/2023)   Hunger Vital Sign    Worried About Running Out of Food in the Last Year: Never true    Ran Out of Food in the Last Year: Never true  Transportation Needs: No Transportation Needs (02/21/2023)   PRAPARE - Administrator, Civil Service (Medical): No    Lack of Transportation (Non-Medical): No  Physical Activity: Inactive (02/21/2023)   Exercise Vital Sign    Days of Exercise per Week: 2 days    Minutes of Exercise per Session: 0 min  Stress: No Stress Concern Present (02/21/2023)   [employer] of Occupational Health - Occupational Stress Questionnaire    Feeling of Stress : Not at all  Social Connections: Moderately Integrated (02/21/2023)   Social Connection and Isolation Panel [NHANES]    Frequency of Communication with Friends and Family: More than three times a week    Frequency of Social Gatherings with Friends and Family: Once a week    Attends Religious Services: More than 4 times per year    Active Member of [employer] or Organizations: Yes    Attends Banker Meetings: 1 to 4 times per year    Marital Status: Divorced  Catering manager Violence: Not on file    Outpatient Medications Prior to Visit  Medication Sig Dispense Refill   ARIPiprazole (ABILIFY) 10 MG tablet Take 1 tablet (10 mg total) by mouth daily. 90 tablet  1   aspirin EC 81 MG tablet Take 81 mg by mouth daily.     atorvastatin (LIPITOR) 20 MG tablet Take 1 tablet (20 mg total) by mouth daily. 90 tablet 0   carvedilol (COREG) 6.25 MG tablet Take 1 tablet (6.25 mg total) by mouth 2 (two) times daily with a meal. 60 tablet 3  Cholecalciferol (VITAMIN D3) 75 MCG (3000 UT) TABS Take 1 tablet by mouth daily at 6 (six) AM.     hydrochlorothiazide (HYDRODIURIL) 25 MG tablet Take 1 tablet (25 mg total) by mouth daily. 90 tablet 1   lamoTRIgine (LAMICTAL) 100 MG tablet Take 1 tablet (100 mg total) by mouth every morning AND 1 tablet (100 mg total) at bedtime. 180 tablet 1   potassium chloride (KLOR-CON M) 10 MEQ tablet Take 1 tablet (10 mEq total) by mouth 2 (two) times daily. 180 tablet 1   psyllium (METAMUCIL) 58.6 % powder Take 1 packet by mouth 3 (three) times daily.     SODIUM FLUORIDE, DENTAL RINSE, (PREVIDENT) 0.2 % SOLN USE AS AN ORAL RINSE, AS DIRECTED ON PACKAGE. 473 mL 5   traZODone (DESYREL) 100 MG tablet Take 1-2 tablets (100-200 mg total) by mouth at bedtime as needed for sleep. 60 tablet 2   traZODone (DESYREL) 100 MG tablet Take 1-2 tablets (100-200 mg total) by mouth at bedtime as needed for sleep 60 tablet 5   amLODipine-valsartan (EXFORGE) 5-320 MG tablet Take 1 tablet by mouth daily. 90 tablet 0   No facility-administered medications prior to visit.    Allergies  Allergen Reactions   Sulfonamide Derivatives Hives and Other (See Comments)    Light sensitivity    ROS See HPI    Objective:    Physical Exam Constitutional:      General: She is not in acute distress.    Appearance: Normal appearance. She is well-developed.  HENT:     Head: Normocephalic and atraumatic.     Right Ear: External ear normal.     Left Ear: External ear normal.  Eyes:     General: No scleral icterus. Neck:     Thyroid: No thyromegaly.  Cardiovascular:     Rate and Rhythm: Normal rate and regular rhythm.     Heart sounds: Murmur heard.   Pulmonary:     Effort: Pulmonary effort is normal. No respiratory distress.     Breath sounds: Normal breath sounds. No wheezing.  Musculoskeletal:     Cervical back: Neck supple.  Skin:    General: Skin is warm and dry.  Neurological:     Mental Status: She is alert and oriented to person, place, and time.  Psychiatric:        Mood and Affect: Mood normal.        Behavior: Behavior normal.        Thought Content: Thought content normal.        Judgment: Judgment normal.      BP (!) 165/84   Pulse 86   Temp 98.4 F (36.9 C) (Oral)   Resp 16   Ht 5\' 4"  (1.626 m)   Wt 169 lb (76.7 kg)   LMP 12/25/2008   SpO2 100%   BMI 29.01 kg/m  Wt Readings from Last 3 Encounters:  04/17/23 169 lb (76.7 kg)  04/09/23 166 lb (75.3 kg)  03/26/23 166 lb (75.3 kg)       Assessment & Plan:   Problem List Items Addressed This Visit       Unprioritized   Essential hypertension - Primary   Blood pressure improved but still elevated (145/68) after addition of hydrochlorothiazide to regimen. -Increase Exforge to 10/320mg  daily. -Check blood pressure in 1 month.      Relevant Medications   amLODipine (NORVASC) 10 MG tablet   valsartan (DIOVAN) 320 MG tablet   Other Relevant Orders  Basic Metabolic Panel (BMET) (Completed)   Diabetes type 2, controlled (HCC)   Lab Results  Component Value Date   HGBA1C 5.9 04/17/2023   HGBA1C 5.7 11/27/2022   HGBA1C 5.6 05/21/2022   Lab Results  Component Value Date   MICROALBUR 1.7 05/21/2022   LDLCALC 52 08/21/2022   CREATININE 1.63 (H) 04/17/2023  Diet Controlled.        Relevant Medications   valsartan (DIOVAN) 320 MG tablet   Other Relevant Orders   HgB A1c (Completed)   Acute cystitis with hematuria   UA/hx suggests UTI, start Keflex.  Send urine for culture. Repeat UA 1 month to ensure resolution of hematuria.       Relevant Medications   cephALEXin (KEFLEX) 500 MG capsule   Other Relevant Orders   POCT Urinalysis Dipstick  (Automated) (Completed)   Urine Culture    I have discontinued Kaitlyn A. Seedorf "Gwen"'s amLODipine-valsartan and amLODIPine-Valsartan-HCTZ. I have also changed her cephALEXin. Additionally, I am having her start on amLODipine and valsartan. Lastly, I am having her maintain her aspirin EC, psyllium, PreviDent, Vitamin D3, atorvastatin, traZODone, ARIPiprazole, lamoTRIgine, traZODone, carvedilol, hydrochlorothiazide, and potassium chloride.  Meds ordered this encounter  Medications   DISCONTD: amLODIPine-Valsartan-HCTZ 10-320-25 MG TABS    Sig: Take 1 tablet by mouth daily at 6 (six) AM.    Dispense:  30 tablet    Refill:  2    Supervising Provider:   Danise Edge A [[id number]]   cephALEXin (KEFLEX) 500 MG capsule    Sig: Take 1 capsule (500 mg total) by mouth 2 (two) times daily.    Dispense:  10 capsule    Refill:  0    Supervising Provider:   Danise Edge A [[id number]]   amLODipine (NORVASC) 10 MG tablet    Sig: Take 1 tablet (10 mg total) by mouth daily.    Dispense:  90 tablet    Refill:  0    Supervising Provider:   Danise Edge A [[id number]]   valsartan (DIOVAN) 320 MG tablet    Sig: Take 1 tablet (320 mg total) by mouth daily.    Dispense:  90 tablet    Refill:  0    Supervising Provider:   Danise Edge A [[id number]]

## 2023-04-18 ENCOUNTER — Encounter: Payer: Self-pay | Admitting: Family

## 2023-04-18 DIAGNOSIS — N3001 Acute cystitis with hematuria: Secondary | ICD-10-CM | POA: Insufficient documentation

## 2023-04-18 LAB — BASIC METABOLIC PANEL
BUN: 21 mg/dL (ref 6–23)
CO2: 28 meq/L (ref 19–32)
Calcium: 10.3 mg/dL (ref 8.4–10.5)
Chloride: 99 meq/L (ref 96–112)
Creatinine, Ser: 1.63 mg/dL — ABNORMAL HIGH (ref 0.40–1.20)
GFR: 33.02 mL/min — ABNORMAL LOW (ref >60.00)
Glucose, Bld: 98 mg/dL (ref 70–99)
Potassium: 3.6 meq/L (ref 3.5–5.1)
Sodium: 139 meq/L (ref 135–145)

## 2023-04-18 LAB — URINE CULTURE
MICRO NUMBER:: 16162533
SPECIMEN QUALITY:: ADEQUATE

## 2023-04-18 LAB — HEMOGLOBIN A1C: Hgb A1c MFr Bld: 5.9 % (ref 4.6–6.5)

## 2023-04-18 NOTE — Patient Instructions (Addendum)
 VISIT SUMMARY:  Today, we discussed your blood pressure and new urinary symptoms. Your blood pressure has improved but is still slightly elevated. You also reported new urinary symptoms that started this morning, including foul-smelling and cloudy urine with blood. We addressed these issues and made adjustments to your treatment plan.  YOUR PLAN:  -HYPERTENSION: Hypertension means high blood pressure. Your blood pressure has improved with the current medication but is still a bit high. We are increasing your Exforge dose to 10/320mg  daily. Please check your blood pressure in one month.  -URINARY TRACT INFECTION: A urinary tract infection (UTI) is an infection in any part of your urinary system. You have symptoms of a UTI, including foul-smelling and cloudy urine with blood. We will collect a urine sample for culture and start you on Keflex 500mg  twice a day for 5 days. We will notify you of the culture results and adjust your antibiotics if necessary.  -HYPOKALEMIA: We will check your metabolic panel, including potassium levels, today. Continue taking your potassium supplement as prescribed.  -DIABETES: Diabetes is a condition that affects how your body processes blood sugar. Although we did not discuss it in detail today, we will check your A1C level today to monitor your blood sugar control.  INSTRUCTIONS:  Please check your blood pressure in one month and follow up with [place] if you have any concerns. We will notify you of your urine culture results and any necessary changes to your antibiotics. Continue taking your potassium supplement as prescribed. We will also check your A1C level today to monitor your diabetes.  For more information, you can read your full clinical note, available in your patient portal.

## 2023-04-18 NOTE — Assessment & Plan Note (Signed)
 Blood pressure improved but still elevated (145/68) after addition of hydrochlorothiazide to regimen. -Increase Exforge to 10/320mg  daily. -Check blood pressure in 1 month.

## 2023-04-18 NOTE — Assessment & Plan Note (Addendum)
 Lab Results  Component Value Date   HGBA1C 5.9 04/17/2023   HGBA1C 5.7 11/27/2022   HGBA1C 5.6 05/21/2022   Lab Results  Component Value Date   MICROALBUR 1.7 05/21/2022   LDLCALC 52 08/21/2022   CREATININE 1.63 (H) 04/17/2023  Diet Controlled.

## 2023-04-18 NOTE — Assessment & Plan Note (Addendum)
 UA/hx suggests UTI, start Keflex.  Send urine for culture. Repeat UA 1 month to ensure resolution of hematuria.

## 2023-04-29 ENCOUNTER — Telehealth: Payer: Self-pay | Admitting: *Deleted

## 2023-04-29 ENCOUNTER — Telehealth: Payer: Self-pay | Admitting: Family

## 2023-04-29 NOTE — Telephone Encounter (Signed)
 Copied from CRM [id number]. Topic: General - Other >> Apr 29, 2023  2:11 PM Whitney O wrote: Reason for CRM: patient is returning call she received today called cal and they said to listen to her voicemail but there was no voicemail . Told patient it could have been to confirm appointment . Please give patient a call back

## 2023-04-29 NOTE — Telephone Encounter (Signed)
 Copied from CRM [id number]. Topic: Clinical - Prescription Issue >> Apr 29, 2023  2:16 PM Whitney O wrote: Reason for CRM: patient is calling to ask a question about blood pressure medication. And patient wanted to know should she still take the  exforge. Plus the new medication she was put on. Valsartan and amlodipine Please reach out to patient concerning this information and patient says her last blood pressure was 114/68 [phone]

## 2023-04-30 NOTE — Telephone Encounter (Signed)
Patient notified of this.

## 2023-04-30 NOTE — Telephone Encounter (Signed)
 Stop Exforge, just take the valsartan and amlodipine please. Insurance wouldn't pay for the combo drug so I had to split it up.

## 2023-04-30 NOTE — Telephone Encounter (Signed)
Spoke to patient earlier today

## 2023-05-01 DIAGNOSIS — N189 Chronic kidney disease, unspecified: Secondary | ICD-10-CM | POA: Diagnosis not present

## 2023-05-01 DIAGNOSIS — I129 Hypertensive chronic kidney disease with stage 1 through stage 4 chronic kidney disease, or unspecified chronic kidney disease: Secondary | ICD-10-CM | POA: Diagnosis not present

## 2023-05-01 DIAGNOSIS — N2581 Secondary hyperparathyroidism of renal origin: Secondary | ICD-10-CM | POA: Diagnosis not present

## 2023-05-01 DIAGNOSIS — E1129 Type 2 diabetes mellitus with other diabetic kidney complication: Secondary | ICD-10-CM | POA: Diagnosis not present

## 2023-05-01 DIAGNOSIS — R319 Hematuria, unspecified: Secondary | ICD-10-CM | POA: Diagnosis not present

## 2023-05-01 DIAGNOSIS — D631 Anemia in chronic kidney disease: Secondary | ICD-10-CM | POA: Diagnosis not present

## 2023-05-01 DIAGNOSIS — N1831 Chronic kidney disease, stage 3a: Secondary | ICD-10-CM | POA: Diagnosis not present

## 2023-05-02 ENCOUNTER — Other Ambulatory Visit (HOSPITAL_BASED_OUTPATIENT_CLINIC_OR_DEPARTMENT_OTHER): Payer: Self-pay

## 2023-05-06 ENCOUNTER — Other Ambulatory Visit (HOSPITAL_BASED_OUTPATIENT_CLINIC_OR_DEPARTMENT_OTHER): Payer: Self-pay

## 2023-05-10 DIAGNOSIS — N1831 Chronic kidney disease, stage 3a: Secondary | ICD-10-CM | POA: Diagnosis not present

## 2023-05-15 ENCOUNTER — Ambulatory Visit: Admitting: Family

## 2023-05-16 ENCOUNTER — Other Ambulatory Visit: Payer: Self-pay

## 2023-05-16 ENCOUNTER — Other Ambulatory Visit (HOSPITAL_BASED_OUTPATIENT_CLINIC_OR_DEPARTMENT_OTHER): Payer: Self-pay

## 2023-05-16 ENCOUNTER — Other Ambulatory Visit: Payer: Self-pay | Admitting: Family

## 2023-05-16 MED ORDER — ATORVASTATIN CALCIUM 20 MG PO TABS
20.0000 mg | ORAL_TABLET | Freq: Every day | ORAL | 0 refills | Status: DC
  Filled 2023-05-16 – 2023-05-27 (×2): qty 90, 90d supply, fill #0

## 2023-05-17 ENCOUNTER — Other Ambulatory Visit (HOSPITAL_BASED_OUTPATIENT_CLINIC_OR_DEPARTMENT_OTHER): Payer: Self-pay

## 2023-05-20 ENCOUNTER — Other Ambulatory Visit (HOSPITAL_BASED_OUTPATIENT_CLINIC_OR_DEPARTMENT_OTHER): Payer: Self-pay

## 2023-05-27 ENCOUNTER — Other Ambulatory Visit (HOSPITAL_BASED_OUTPATIENT_CLINIC_OR_DEPARTMENT_OTHER): Payer: Self-pay

## 2023-06-04 ENCOUNTER — Telehealth: Payer: Self-pay | Admitting: Family

## 2023-06-04 NOTE — Telephone Encounter (Signed)
 Copied from CRM [id number]. Topic: General - Other >> Jun 04, 2023  8:23 AM Jenice Mitts wrote: Reason for CRM: Patient is calling because she doesn't see the followup lab appointment necessary. Patient was wonder is the doctor would like a followup visit instead

## 2023-06-05 ENCOUNTER — Other Ambulatory Visit

## 2023-06-05 NOTE — Telephone Encounter (Signed)
 Follow up in 3 months with me for appointment.  Please address blood in urine with the Urologist. Where is she going for Urology?

## 2023-06-05 NOTE — Telephone Encounter (Signed)
 Patient notified of this information, and scheduled to return in July. She will see [facility].

## 2023-06-13 DIAGNOSIS — R310 Gross hematuria: Secondary | ICD-10-CM | POA: Diagnosis not present

## 2023-06-13 DIAGNOSIS — R350 Frequency of micturition: Secondary | ICD-10-CM | POA: Diagnosis not present

## 2023-06-18 ENCOUNTER — Other Ambulatory Visit (HOSPITAL_BASED_OUTPATIENT_CLINIC_OR_DEPARTMENT_OTHER): Payer: Self-pay

## 2023-06-18 ENCOUNTER — Other Ambulatory Visit: Payer: Self-pay

## 2023-06-18 MED ORDER — NITROFURANTOIN MACROCRYSTAL 100 MG PO CAPS
100.0000 mg | ORAL_CAPSULE | Freq: Two times a day (BID) | ORAL | 0 refills | Status: DC
  Filled 2023-06-18: qty 14, 7d supply, fill #0

## 2023-06-19 ENCOUNTER — Other Ambulatory Visit (HOSPITAL_BASED_OUTPATIENT_CLINIC_OR_DEPARTMENT_OTHER): Payer: Self-pay

## 2023-06-25 DIAGNOSIS — R310 Gross hematuria: Secondary | ICD-10-CM | POA: Diagnosis not present

## 2023-06-28 ENCOUNTER — Other Ambulatory Visit: Payer: Self-pay | Admitting: Urology

## 2023-06-28 DIAGNOSIS — N281 Cyst of kidney, acquired: Secondary | ICD-10-CM

## 2023-07-01 ENCOUNTER — Other Ambulatory Visit (HOSPITAL_BASED_OUTPATIENT_CLINIC_OR_DEPARTMENT_OTHER): Payer: Self-pay

## 2023-07-02 ENCOUNTER — Encounter: Payer: Self-pay | Admitting: Urology

## 2023-07-02 ENCOUNTER — Other Ambulatory Visit (HOSPITAL_BASED_OUTPATIENT_CLINIC_OR_DEPARTMENT_OTHER): Payer: Self-pay

## 2023-07-03 DIAGNOSIS — N3000 Acute cystitis without hematuria: Secondary | ICD-10-CM | POA: Diagnosis not present

## 2023-07-03 DIAGNOSIS — R350 Frequency of micturition: Secondary | ICD-10-CM | POA: Diagnosis not present

## 2023-07-03 DIAGNOSIS — R310 Gross hematuria: Secondary | ICD-10-CM | POA: Diagnosis not present

## 2023-07-05 ENCOUNTER — Encounter: Payer: Self-pay | Admitting: Urology

## 2023-07-05 ENCOUNTER — Other Ambulatory Visit (HOSPITAL_BASED_OUTPATIENT_CLINIC_OR_DEPARTMENT_OTHER): Payer: Self-pay

## 2023-07-05 MED ORDER — DIAZEPAM 5 MG PO TABS
5.0000 mg | ORAL_TABLET | Freq: Every day | ORAL | 0 refills | Status: DC
  Filled 2023-07-05: qty 1, 1d supply, fill #0

## 2023-07-09 ENCOUNTER — Other Ambulatory Visit: Payer: Self-pay | Admitting: Family

## 2023-07-09 ENCOUNTER — Other Ambulatory Visit (HOSPITAL_BASED_OUTPATIENT_CLINIC_OR_DEPARTMENT_OTHER): Payer: Self-pay

## 2023-07-09 DIAGNOSIS — I10 Essential (primary) hypertension: Secondary | ICD-10-CM

## 2023-07-09 MED ORDER — METRONIDAZOLE 500 MG PO TABS
2000.0000 mg | ORAL_TABLET | ORAL | 1 refills | Status: DC
  Filled 2023-07-09: qty 4, 1d supply, fill #0
  Filled 2023-07-10: qty 4, 1d supply, fill #1

## 2023-07-09 MED ORDER — AMLODIPINE BESYLATE 10 MG PO TABS
10.0000 mg | ORAL_TABLET | Freq: Every day | ORAL | 0 refills | Status: DC
  Filled 2023-07-09: qty 90, 90d supply, fill #0

## 2023-07-10 ENCOUNTER — Other Ambulatory Visit: Payer: Self-pay | Admitting: Family

## 2023-07-10 ENCOUNTER — Other Ambulatory Visit (HOSPITAL_BASED_OUTPATIENT_CLINIC_OR_DEPARTMENT_OTHER): Payer: Self-pay

## 2023-07-10 ENCOUNTER — Other Ambulatory Visit: Payer: Self-pay

## 2023-07-10 ENCOUNTER — Other Ambulatory Visit (HOSPITAL_COMMUNITY): Payer: Self-pay

## 2023-07-10 DIAGNOSIS — I10 Essential (primary) hypertension: Secondary | ICD-10-CM

## 2023-07-10 MED ORDER — VALSARTAN 320 MG PO TABS
320.0000 mg | ORAL_TABLET | Freq: Every day | ORAL | 0 refills | Status: DC
  Filled 2023-07-10: qty 90, 90d supply, fill #0

## 2023-07-15 ENCOUNTER — Ambulatory Visit
Admission: RE | Admit: 2023-07-15 | Discharge: 2023-07-15 | Disposition: A | Discharge status: Home / Self Care | Source: Ambulatory Visit | Attending: Urology | Admitting: Urology

## 2023-07-15 DIAGNOSIS — N281 Cyst of kidney, acquired: Secondary | ICD-10-CM

## 2023-07-16 ENCOUNTER — Other Ambulatory Visit (HOSPITAL_BASED_OUTPATIENT_CLINIC_OR_DEPARTMENT_OTHER): Payer: Self-pay

## 2023-07-16 MED ORDER — DIAZEPAM 5 MG PO TABS
5.0000 mg | ORAL_TABLET | Freq: Every day | ORAL | 0 refills | Status: DC
  Filled 2023-07-16: qty 1, 1d supply, fill #0

## 2023-07-24 ENCOUNTER — Ambulatory Visit
Admission: RE | Admit: 2023-07-24 | Discharge: 2023-07-24 | Disposition: A | Discharge status: Home / Self Care | Source: Ambulatory Visit | Attending: Urology | Admitting: Urology

## 2023-07-24 DIAGNOSIS — N281 Cyst of kidney, acquired: Secondary | ICD-10-CM | POA: Diagnosis not present

## 2023-07-24 DIAGNOSIS — K5710 Diverticulosis of small intestine without perforation or abscess without bleeding: Secondary | ICD-10-CM | POA: Diagnosis not present

## 2023-07-24 MED ORDER — GADOPICLENOL 0.5 MMOL/ML IV SOLN
7.0000 mL | Freq: Once | INTRAVENOUS | Status: AC | PRN
  Administered 2023-07-24: 7 mL via INTRAVENOUS

## 2023-08-19 ENCOUNTER — Other Ambulatory Visit: Payer: Self-pay | Admitting: Family

## 2023-08-19 ENCOUNTER — Other Ambulatory Visit: Payer: Self-pay

## 2023-08-19 ENCOUNTER — Other Ambulatory Visit (HOSPITAL_BASED_OUTPATIENT_CLINIC_OR_DEPARTMENT_OTHER): Payer: Self-pay

## 2023-08-19 MED ORDER — ATORVASTATIN CALCIUM 20 MG PO TABS
20.0000 mg | ORAL_TABLET | Freq: Every day | ORAL | 0 refills | Status: DC
  Filled 2023-08-19: qty 90, 90d supply, fill #0

## 2023-08-27 ENCOUNTER — Other Ambulatory Visit (HOSPITAL_BASED_OUTPATIENT_CLINIC_OR_DEPARTMENT_OTHER): Payer: Self-pay

## 2023-08-27 ENCOUNTER — Ambulatory Visit (INDEPENDENT_AMBULATORY_CARE_PROVIDER_SITE_OTHER): Admitting: Family

## 2023-08-27 ENCOUNTER — Telehealth: Payer: Self-pay | Admitting: Family

## 2023-08-27 VITALS — BP 142/79 | HR 78 | Temp 98.7°F | Resp 16 | Ht 64.0 in | Wt 159.0 lb

## 2023-08-27 DIAGNOSIS — J302 Other seasonal allergic rhinitis: Secondary | ICD-10-CM | POA: Diagnosis not present

## 2023-08-27 DIAGNOSIS — N183 Chronic kidney disease, stage 3 unspecified: Secondary | ICD-10-CM

## 2023-08-27 DIAGNOSIS — E559 Vitamin D deficiency, unspecified: Secondary | ICD-10-CM | POA: Diagnosis not present

## 2023-08-27 DIAGNOSIS — I10 Essential (primary) hypertension: Secondary | ICD-10-CM

## 2023-08-27 DIAGNOSIS — I2510 Atherosclerotic heart disease of native coronary artery without angina pectoris: Secondary | ICD-10-CM | POA: Diagnosis not present

## 2023-08-27 DIAGNOSIS — E785 Hyperlipidemia, unspecified: Secondary | ICD-10-CM

## 2023-08-27 DIAGNOSIS — Z720 Tobacco use: Secondary | ICD-10-CM

## 2023-08-27 DIAGNOSIS — F32A Depression, unspecified: Secondary | ICD-10-CM

## 2023-08-27 DIAGNOSIS — E1122 Type 2 diabetes mellitus with diabetic chronic kidney disease: Secondary | ICD-10-CM

## 2023-08-27 DIAGNOSIS — E876 Hypokalemia: Secondary | ICD-10-CM | POA: Diagnosis not present

## 2023-08-27 DIAGNOSIS — Z23 Encounter for immunization: Secondary | ICD-10-CM

## 2023-08-27 LAB — LIPID PANEL
Cholesterol: 128 mg/dL (ref <200)
HDL: 35 mg/dL — ABNORMAL LOW (ref >=50)
LDL Cholesterol (Calc): 70 mg/dL
Non-HDL Cholesterol (Calc): 93 mg/dL (ref <130)
Total CHOL/HDL Ratio: 3.7 (calc) (ref <5.0)
Triglycerides: 144 mg/dL (ref <150)

## 2023-08-27 LAB — COMPREHENSIVE METABOLIC PANEL WITH GFR
ALT: 15 U/L (ref 0–35)
AST: 17 U/L (ref 0–37)
Albumin: 4.7 g/dL (ref 3.5–5.2)
Alkaline Phosphatase: 92 U/L (ref 39–117)
BUN: 12 mg/dL (ref 6–23)
CO2: 31 meq/L (ref 19–32)
Calcium: 10.1 mg/dL (ref 8.4–10.5)
Chloride: 102 meq/L (ref 96–112)
Creatinine, Ser: 1.22 mg/dL — ABNORMAL HIGH (ref 0.40–1.20)
GFR: 46.64 mL/min — ABNORMAL LOW (ref >60.00)
Glucose, Bld: 111 mg/dL — ABNORMAL HIGH (ref 70–99)
Potassium: 3.4 meq/L — ABNORMAL LOW (ref 3.5–5.1)
Sodium: 141 meq/L (ref 135–145)
Total Bilirubin: 0.5 mg/dL (ref 0.2–1.2)
Total Protein: 7.9 g/dL (ref 6.0–8.3)

## 2023-08-27 LAB — MICROALBUMIN / CREATININE URINE RATIO
Creatinine,U: 129.8 mg/dL
Microalb Creat Ratio: 7.2 mg/g (ref 0.0–30.0)
Microalb, Ur: 0.9 mg/dL (ref 0.0–1.9)

## 2023-08-27 LAB — HEMOGLOBIN A1C: Hgb A1c MFr Bld: 5.9 % (ref 4.6–6.5)

## 2023-08-27 MED ORDER — POTASSIUM CHLORIDE CRYS ER 10 MEQ PO TBCR
10.0000 meq | EXTENDED_RELEASE_TABLET | Freq: Two times a day (BID) | ORAL | 1 refills | Status: DC
Start: 2023-08-27 — End: 2024-01-07
  Filled 2023-08-27 – 2023-09-30 (×2): qty 180, 90d supply, fill #0

## 2023-08-27 MED ORDER — HYDROCHLOROTHIAZIDE 25 MG PO TABS
25.0000 mg | ORAL_TABLET | Freq: Every day | ORAL | 1 refills | Status: AC
  Filled 2023-08-27 – 2023-09-30 (×2): qty 90, 90d supply, fill #0
  Filled 2023-12-31: qty 90, 90d supply, fill #1

## 2023-08-27 MED ORDER — AMLODIPINE BESYLATE 10 MG PO TABS
10.0000 mg | ORAL_TABLET | Freq: Every day | ORAL | 0 refills | Status: DC
  Filled 2023-08-27 – 2023-10-05 (×2): qty 90, 90d supply, fill #0

## 2023-08-27 MED ORDER — VARENICLINE TARTRATE (STARTER) 0.5 MG X 11 & 1 MG X 42 PO TBPK
ORAL_TABLET | ORAL | 0 refills | Status: DC
  Filled 2023-08-27: qty 53, 28d supply, fill #0

## 2023-08-27 MED ORDER — ATORVASTATIN CALCIUM 20 MG PO TABS
20.0000 mg | ORAL_TABLET | Freq: Every day | ORAL | 0 refills | Status: DC
  Filled 2023-08-29: qty 90, 90d supply, fill #0

## 2023-08-27 MED ORDER — CARVEDILOL 6.25 MG PO TABS
6.2500 mg | ORAL_TABLET | Freq: Two times a day (BID) | ORAL | 3 refills | Status: DC
Start: 2023-08-27 — End: 2023-12-23
  Filled 2023-08-27: qty 60, 30d supply, fill #0
  Filled 2023-09-27: qty 60, 30d supply, fill #1
  Filled 2023-10-28: qty 60, 30d supply, fill #2
  Filled 2023-11-21: qty 60, 30d supply, fill #3

## 2023-08-27 NOTE — Progress Notes (Signed)
 Subjective:     Patient ID: Kaitlyn Harper, female    DOB: [birth year], 63 y.o.   MRN: [id number]  Chief Complaint  Patient presents with   Hypertension    Here for follow up, advised to discontinue valsartan  per nephrology    HPI  Discussed the use of AI scribe software for clinical note transcription with the patient, who gave verbal consent to proceed.  History of Present Illness  Kaitlyn Harper is a 63 year old female with hypertension and kidney disease who presents for a routine follow-up.  Her morning blood pressure readings are higher than in the evening, with a recent reading of 142/79 mmHg, which is above her target. She discontinued valsartan  due to changes in kidney function as advised by nephrology. Her current antihypertensive regimen includes amlodipine  10 mg, carvedilol  6.25 mg twice daily, and hydrochlorothiazide  25 mg. She takes Lipitor 20 mg for cholesterol management. She experienced a brief episode of chest tightness that resolved upon swallowing.  She has a history of smoking and is interested in quitting. She has not yet tried Chantix  but is open to starting it.  Her depression is stable on trazodone , Abilify , and Lamictal . A recent urology visit revealed a urinary tract infection, which was treated. She states that an MRI showed cysts on both kidneys, but no further action was required. Her A1c levels are stable, and she is up to date with her eye exams. She uses Metamucil as needed for bowel regularity but is considering switching to MiraLAX .     Health Maintenance Due  Topic Date Due   Medicare Annual Wellness (AWV)  Never done   COVID-19 Vaccine (6 - 2024-25 season) 10/14/2022   OPHTHALMOLOGY EXAM  05/10/2023    Past Medical History:  Diagnosis Date   Anemia    resolved    Anxiety    Bipolar disorder (HCC)    Chronic kidney disease stage 3    Depression    Diabetes mellitus without complication (HCC)    type 2-diet controlled    Diverticulitis    History of diverticulitis 12/17/2018   Hyperglycemia    Hyperlipidemia    Hypertension    NSTEMI (non-ST elevated myocardial infarction) (HCC) 2018   reports she fainted at his sons house and they took her to ED  , troponin was elevated and cardiac cath was completed,  no stent placed nor hx of stent intervention , she reports no casue was ever determined for the sycope episode    Syncope 03/18/2016   reports no recurrence     Past Surgical History:  Procedure Laterality Date   APPENDECTOMY  1985   BREAST BIOPSY Right 01/13/2014   benign   CHOLECYSTECTOMY  1985   COLONOSCOPY W/ POLYPECTOMY  03/01/2015   Dr Rolan Cedar, [facility].  +adenomatous polyps   COLONOSCOPY W/ POLYPECTOMY  02/28/2011   4 polyps - 3 TA   DILATION AND CURETTAGE OF UTERUS  2005   HEMORRHOID SURGERY  2002   KNEE SURGERY  1983   arthroscopy?   LEFT HEART CATH AND CORONARY ANGIOGRAPHY N/A 03/19/2016   Procedure: Left Heart Cath and Coronary Angiography;  Surgeon: Peter M Swaziland, MD;  Location: [facility] INVASIVE CV LAB;  Service: Cardiovascular;  Laterality: N/A;   MOUTH SURGERY  03/08/2016   POLYPECTOMY  2006   ?anal polyp?   TEAR DUCT PROBING  2004   TUBAL LIGATION  1985    Family History  Problem Relation Age of Onset  Hypertension Father    Sarcoidosis Brother    Alcohol abuse Brother    Cirrhosis Brother    Stroke Maternal Grandmother    Colon cancer Neg Hx    Colon polyps Neg Hx    Esophageal cancer Neg Hx    Rectal cancer Neg Hx    Stomach cancer Neg Hx    Pulmonary embolism Neg Hx     Social History   Socioeconomic History   Marital status: Single    Spouse name: Not on file   Number of children: 3   Years of education: Not on file   Highest education level: Some college, no degree  Occupational History   Occupation: Armed forces operational officer: [employer]  Tobacco Use   Smoking status: Some Days    Current packs/day: 0.00    Types: Cigarettes    Last attempt  to quit: 07/02/2015    Years since quitting: 8.1   Smokeless tobacco: Never   Tobacco comments:    10 cigarettes daily; 10-30 reports  one to two cigarettes every now and again     10/31/22 Patient states that she smokes about a pack of cigarettes a week.   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/2023)   Overall Financial Resource Strain (CARDIA)    Difficulty of Paying Living Expenses: Not very hard  Food Insecurity: No Food Insecurity (02/21/2023)   Hunger Vital Sign    Worried About Running Out of Food in the Last Year: Never true    Ran Out of Food in the Last Year: Never true  Transportation Needs: No Transportation Needs (02/21/2023)   PRAPARE - Administrator, Civil Service (Medical): No    Lack of Transportation (Non-Medical): No  Physical Activity: Inactive (02/21/2023)   Exercise Vital Sign    Days of Exercise per Week: 2 days    Minutes of Exercise per Session: 0 min  Stress: No Stress Concern Present (02/21/2023)   [employer] of Occupational Health - Occupational Stress Questionnaire    Feeling of Stress : Not at all  Social Connections: Moderately Integrated (02/21/2023)   Social Connection and Isolation Panel    Frequency of Communication with Friends and Family: More than three times a week    Frequency of Social Gatherings with Friends and Family: Once a week    Attends Religious Services: More than 4 times per year    Active Member of [employer] or Organizations: Yes     Attends Banker Meetings: 1 to 4 times per year    Marital Status: Divorced  Catering manager Violence: Not on file    Outpatient Medications Prior to Visit  Medication Sig Dispense Refill   ARIPiprazole  (ABILIFY ) 10 MG tablet Take 1 tablet (10 mg total) by mouth daily. 90 tablet 1   aspirin  EC 81 MG tablet Take 81  mg by mouth daily.     lamoTRIgine  (LAMICTAL ) 100 MG tablet Take 1 tablet (100 mg total) by mouth every morning AND 1 tablet (100 mg total) at bedtime. 180 tablet 1   psyllium (METAMUCIL) 58.6 % powder Take 1 packet by mouth 3 (three) times daily.     traZODone  (DESYREL ) 100 MG tablet Take 1-2 tablets (100-200 mg total) by mouth at bedtime as needed for sleep. 60 tablet 2   traZODone  (DESYREL ) 100 MG tablet Take 1-2 tablets (100-200 mg total) by mouth at bedtime as needed for sleep 60 tablet 5   amLODipine  (NORVASC ) 10 MG tablet Take 1 tablet (10 mg total) by mouth daily. 90 tablet 0   atorvastatin  (LIPITOR) 20 MG tablet Take 1 tablet (20 mg total) by mouth daily. 90 tablet 0   carvedilol  (COREG ) 6.25 MG tablet Take 1 tablet (6.25 mg total) by mouth 2 (two) times daily with a meal. 60 tablet 3   diazepam  (VALIUM ) 5 MG tablet Take 1 tablet (5 mg total) by mouth 30 minutes prior to the procedure. 1 tablet 0   hydrochlorothiazide  (HYDRODIURIL ) 25 MG tablet Take 1 tablet (25 mg total) by mouth daily. 90 tablet 1   potassium chloride  (KLOR-CON  M) 10 MEQ tablet Take 1 tablet (10 mEq total) by mouth 2 (two) times daily. 180 tablet 1   SODIUM FLUORIDE , DENTAL RINSE, (PREVIDENT ) 0.2 % SOLN USE AS AN ORAL RINSE, AS DIRECTED ON PACKAGE. 473 mL 5   cephALEXin  (KEFLEX ) 500 MG capsule Take 1 capsule (500 mg total) by mouth 2 (two) times daily. 10 capsule 0   Cholecalciferol  (VITAMIN D3) 75 MCG (3000 UT) TABS Take 1 tablet by mouth daily at 6 (six) AM.     metroNIDAZOLE  (FLAGYL ) 500 MG tablet Take 4 tablets (2,000 mg total) by mouth stat with 8 ounces of water. 4 tablet 1    nitrofurantoin  (MACRODANTIN ) 100 MG capsule Take 1 capsule (100 mg total) by mouth 2 (two) times daily. 14 capsule 0   valsartan  (DIOVAN ) 320 MG tablet Take 1 tablet (320 mg total) by mouth daily. (Patient not taking: Reported on 08/27/2023) 90 tablet 0   No facility-administered medications prior to visit.    Allergies  Allergen Reactions   Sulfonamide Derivatives Hives and Other (See Comments)    Light sensitivity    ROS     Objective:    Physical Exam Constitutional:      General: She is not in acute distress.    Appearance: Normal appearance. She is well-developed.  HENT:     Head: Normocephalic and atraumatic.     Right Ear: External ear normal.     Left Ear: External ear normal.  Eyes:     General: No scleral icterus. Neck:     Thyroid : No thyromegaly.  Cardiovascular:     Rate and Rhythm: Normal rate and regular rhythm.     Heart sounds: Normal heart sounds. No murmur heard. Pulmonary:     Effort: Pulmonary effort is normal. No respiratory distress.     Breath sounds: Normal breath sounds. No wheezing.  Musculoskeletal:     Cervical back: Neck supple.  Skin:    General: Skin is warm and dry.  Neurological:     Mental Status: She is alert and oriented to person, place, and time.  Psychiatric:        Mood and Affect: Mood normal.        Behavior: Behavior normal.        Thought  Content: Thought content normal.        Judgment: Judgment normal.      BP (!) 142/79   Pulse 78   Temp 98.7 F (37.1 C) (Oral)   Resp 16   Ht 5' 4 (1.626 m)   Wt 159 lb (72.1 kg)   LMP 12/25/2008   SpO2 99%   BMI 27.29 kg/m  Wt Readings from Last 3 Encounters:  08/27/23 159 lb (72.1 kg)  04/17/23 169 lb (76.7 kg)  04/09/23 166 lb (75.3 kg)          Assessment & Plan:   Problem List Items Addressed This Visit       Unprioritized   Hyperlipidemia (Chronic)   Goal LDL <70. Continue atorvastatin .        Relevant Medications   hydrochlorothiazide  (HYDRODIURIL )  25 MG tablet   amLODipine  (NORVASC ) 10 MG tablet   carvedilol  (COREG ) 6.25 MG tablet   atorvastatin  (LIPITOR) 20 MG tablet   Other Relevant Orders   Lipid panel   Vitamin D  deficiency   Was discontinued by nephrology due to rising calcium .      Tobacco abuse   1/2 PPD, will give trial of chantix .  Common side effects including rare risk of suicide ideation was discussed with the patient today.  Patient is instructed to go directly to the ED if this occurs.  We discussed that patient can continue to smoke for 1 week after starting chantix , but then must discontinue cigarettes.  He is also instructed to contact us  prior to completion of the starter month pack for an rx for the continuation month pack.  5 minutes spent with patient today on tobacco cessation counseling.        Relevant Medications   Varenicline  Tartrate, Starter, (CHANTIX  STARTING MONTH PAK) 0.5 MG X 11 & 1 MG X 42 TBPK   Seasonal allergies   Currently stable without medication.       Essential hypertension   BP slightly elevated today off of valsartan . Home readings look very good overall.  Will continue amlodipine  and carvedilol /hydrochlorothiazide .       Relevant Medications   hydrochlorothiazide  (HYDRODIURIL ) 25 MG tablet   amLODipine  (NORVASC ) 10 MG tablet   carvedilol  (COREG ) 6.25 MG tablet   atorvastatin  (LIPITOR) 20 MG tablet   Other Relevant Orders   Comp Met (CMET) (Completed)   Diabetes type 2, controlled (HCC) - Primary   Relevant Medications   atorvastatin  (LIPITOR) 20 MG tablet   Other Relevant Orders   HgB A1c (Completed)   Urine Microalbumin w/creat. ratio (Completed)   Depressive disorder   Stable on abilify , trazodone , lamictal .  Management per psych.       CAD (coronary artery disease)   Clinically stable- following with Cardiology- Dr. Lavona.       Relevant Medications   hydrochlorothiazide  (HYDRODIURIL ) 25 MG tablet   amLODipine  (NORVASC ) 10 MG tablet   carvedilol  (COREG ) 6.25 MG  tablet   atorvastatin  (LIPITOR) 20 MG tablet   Other Visit Diagnoses       Hypokalemia       Relevant Medications   potassium chloride  (KLOR-CON  M) 10 MEQ tablet     Need for pneumococcal 20-valent conjugate vaccination       Relevant Orders   Pneumococcal conjugate vaccine 20-valent (Completed)       I have discontinued Salley A. Harner Gwen's PreviDent , Vitamin D3, cephALEXin , nitrofurantoin , metroNIDAZOLE , valsartan , and diazepam . I am also having her start on Varenicline  Tartrate (Starter). Additionally, I  am having her maintain her aspirin  EC, psyllium, traZODone , ARIPiprazole , lamoTRIgine , traZODone , hydrochlorothiazide , amLODipine , potassium chloride , carvedilol , and atorvastatin .  Meds ordered this encounter  Medications   Varenicline  Tartrate, Starter, (CHANTIX  STARTING MONTH PAK) 0.5 MG X 11 & 1 MG X 42 TBPK    Sig: Take one 0.5 mg tablet by mouth once daily for 3 days, then increase to one 0.5 mg tablet twice daily for 4 days, then increase to one 1 mg tablet twice daily.    Dispense:  53 each    Refill:  0    Supervising Provider:   DOMENICA BLACKBIRD A [[id number]]   hydrochlorothiazide  (HYDRODIURIL ) 25 MG tablet    Sig: Take 1 tablet (25 mg total) by mouth daily.    Dispense:  90 tablet    Refill:  1    Supervising Provider:   DOMENICA BLACKBIRD A [[id number]]   amLODipine  (NORVASC ) 10 MG tablet    Sig: Take 1 tablet (10 mg total) by mouth daily.    Dispense:  90 tablet    Refill:  0    Supervising Provider:   DOMENICA BLACKBIRD A [[id number]]   potassium chloride  (KLOR-CON  M) 10 MEQ tablet    Sig: Take 1 tablet (10 mEq total) by mouth 2 (two) times daily.    Dispense:  180 tablet    Refill:  1    Supervising Provider:   DOMENICA BLACKBIRD A [[id number]]   carvedilol  (COREG ) 6.25 MG tablet    Sig: Take 1 tablet (6.25 mg total) by mouth 2 (two) times daily with a meal.    Dispense:  60 tablet    Refill:  3    Supervising Provider:   DOMENICA BLACKBIRD A [[id number]]   atorvastatin  (LIPITOR) 20 MG tablet     Sig: Take 1 tablet (20 mg total) by mouth daily.    Dispense:  90 tablet    Refill:  0    Supervising Provider:   DOMENICA BLACKBIRD A [[id number]]

## 2023-08-27 NOTE — Assessment & Plan Note (Signed)
 1/2 PPD, will give trial of chantix .  Common side effects including rare risk of suicide ideation was discussed with the patient today.  Patient is instructed to go directly to the ED if this occurs.  We discussed that patient can continue to smoke for 1 week after starting chantix , but then must discontinue cigarettes.  He is also instructed to contact us  prior to completion of the starter month pack for an rx for the continuation month pack.  5 minutes spent with patient today on tobacco cessation counseling.

## 2023-08-27 NOTE — Assessment & Plan Note (Signed)
Goal LDL < 70.  Continue atorvastatin.

## 2023-08-27 NOTE — Assessment & Plan Note (Signed)
Currently stable without medication.

## 2023-08-27 NOTE — Telephone Encounter (Signed)
 Please call [facility] ophthalmology to request copy of DM eye exam.

## 2023-08-27 NOTE — Assessment & Plan Note (Signed)
 BP slightly elevated today off of valsartan . Home readings look very good overall.  Will continue amlodipine  and carvedilol /hydrochlorothiazide .

## 2023-08-27 NOTE — Telephone Encounter (Signed)
 Electronic request made

## 2023-08-27 NOTE — Patient Instructions (Signed)
 VISIT SUMMARY:  Today, we reviewed your blood pressure management, kidney health, smoking cessation plan, and general health maintenance. We also discussed options for managing constipation.  YOUR PLAN:  HYPERTENSION: Your blood pressure readings are higher in the morning, and you have discontinued valsartan  due to kidney concerns. Your current medications include amlodipine , carvedilol , and hydrochlorothiazide . -Continue taking amlodipine , carvedilol , and hydrochlorothiazide  as prescribed. -Monitor your blood pressure regularly and report if your systolic pressure is consistently above 140 mmHg. -We may consider increasing your carvedilol  if needed.  KIDNEY CYSTS: You have cysts on both kidneys, but no further action is required at this time. -We will continue to monitor your kidney function routinely.  SMOKING CESSATION: You are ready to quit smoking and will start varenicline  (Chantix ). -Start a 12-week course of varenicline , beginning with a ramp-up dose. -You can continue smoking during the first week of treatment, then stop. -Monitor for any side effects and mood changes, and contact us  if you have any concerns. -Contact us  before the end of the first month to get a continuation pack if needed.  GENERAL HEALTH MAINTENANCE: You are due for a pneumococcal vaccine booster, and your diabetes management and eye exams are up to date. -We will administer the pneumococcal vaccine booster. -We will order an A1c test to monitor your diabetes. -Please confirm the date of your last eye exam with your eye care provider.  CONSTIPATION: We discussed options for managing your constipation. -You can use polyethylene glycol (MiraLAX ) as recommended.

## 2023-08-27 NOTE — Assessment & Plan Note (Signed)
 Stable on abilify , trazodone , lamictal .  Management per psych.

## 2023-08-27 NOTE — Assessment & Plan Note (Signed)
 Clinically stable- following with Cardiology- Dr. Lavona.

## 2023-08-27 NOTE — Assessment & Plan Note (Signed)
 Was discontinued by nephrology due to rising calcium .

## 2023-08-29 ENCOUNTER — Ambulatory Visit: Payer: Self-pay | Admitting: Family

## 2023-08-29 ENCOUNTER — Other Ambulatory Visit (HOSPITAL_BASED_OUTPATIENT_CLINIC_OR_DEPARTMENT_OTHER): Payer: Self-pay

## 2023-08-29 MED ORDER — ARIPIPRAZOLE 10 MG PO TABS
10.0000 mg | ORAL_TABLET | Freq: Every day | ORAL | 1 refills | Status: AC
  Filled 2023-08-29 – 2023-11-21 (×2): qty 90, 90d supply, fill #0
  Filled 2024-02-19: qty 90, 90d supply, fill #1

## 2023-08-29 MED ORDER — LAMOTRIGINE 100 MG PO TABS
100.0000 mg | ORAL_TABLET | Freq: Two times a day (BID) | ORAL | 1 refills | Status: AC
  Filled 2023-08-29 – 2023-11-01 (×7): qty 180, 90d supply, fill #0
  Filled 2024-01-28 – 2024-02-07 (×2): qty 180, 90d supply, fill #1

## 2023-08-29 MED ORDER — TRAZODONE HCL 100 MG PO TABS
100.0000 mg | ORAL_TABLET | Freq: Every evening | ORAL | 5 refills | Status: AC | PRN
  Filled 2023-09-21 – 2023-09-23 (×2): qty 60, 30d supply, fill #0
  Filled 2023-11-25: qty 60, 30d supply, fill #1
  Filled 2024-02-01: qty 60, 30d supply, fill #2

## 2023-08-30 ENCOUNTER — Other Ambulatory Visit (HOSPITAL_BASED_OUTPATIENT_CLINIC_OR_DEPARTMENT_OTHER): Payer: Self-pay

## 2023-08-30 DIAGNOSIS — E1122 Type 2 diabetes mellitus with diabetic chronic kidney disease: Secondary | ICD-10-CM | POA: Diagnosis not present

## 2023-08-30 DIAGNOSIS — D631 Anemia in chronic kidney disease: Secondary | ICD-10-CM | POA: Diagnosis not present

## 2023-08-30 DIAGNOSIS — R319 Hematuria, unspecified: Secondary | ICD-10-CM | POA: Diagnosis not present

## 2023-08-30 DIAGNOSIS — N1831 Chronic kidney disease, stage 3a: Secondary | ICD-10-CM | POA: Diagnosis not present

## 2023-08-30 DIAGNOSIS — N2581 Secondary hyperparathyroidism of renal origin: Secondary | ICD-10-CM | POA: Diagnosis not present

## 2023-08-30 DIAGNOSIS — I129 Hypertensive chronic kidney disease with stage 1 through stage 4 chronic kidney disease, or unspecified chronic kidney disease: Secondary | ICD-10-CM | POA: Diagnosis not present

## 2023-08-30 DIAGNOSIS — N390 Urinary tract infection, site not specified: Secondary | ICD-10-CM | POA: Diagnosis not present

## 2023-08-30 MED ORDER — VALSARTAN 40 MG PO TABS
40.0000 mg | ORAL_TABLET | Freq: Every day | ORAL | 2 refills | Status: DC
  Filled 2023-08-30 (×2): qty 30, 30d supply, fill #0
  Filled 2023-12-23: qty 30, 30d supply, fill #1

## 2023-08-30 MED ORDER — CEPHALEXIN 500 MG PO CAPS
500.0000 mg | ORAL_CAPSULE | Freq: Two times a day (BID) | ORAL | 0 refills | Status: DC
  Filled 2023-08-30: qty 10, 5d supply, fill #0

## 2023-08-30 MED ORDER — NITROFURANTOIN MACROCRYSTAL 100 MG PO CAPS
100.0000 mg | ORAL_CAPSULE | Freq: Two times a day (BID) | ORAL | 0 refills | Status: DC
  Filled 2023-08-30 – 2023-09-02 (×2): qty 10, 5d supply, fill #0

## 2023-09-02 ENCOUNTER — Other Ambulatory Visit (HOSPITAL_BASED_OUTPATIENT_CLINIC_OR_DEPARTMENT_OTHER): Payer: Self-pay

## 2023-09-05 DIAGNOSIS — N1831 Chronic kidney disease, stage 3a: Secondary | ICD-10-CM | POA: Diagnosis not present

## 2023-09-13 ENCOUNTER — Other Ambulatory Visit (HOSPITAL_BASED_OUTPATIENT_CLINIC_OR_DEPARTMENT_OTHER): Payer: Self-pay

## 2023-09-13 MED ORDER — VALSARTAN 40 MG PO TABS
40.0000 mg | ORAL_TABLET | Freq: Every day | ORAL | 3 refills | Status: DC
  Filled 2023-09-21 – 2023-09-23 (×2): qty 90, 90d supply, fill #0

## 2023-09-21 ENCOUNTER — Other Ambulatory Visit: Payer: Self-pay | Admitting: Family

## 2023-09-21 DIAGNOSIS — Z720 Tobacco use: Secondary | ICD-10-CM

## 2023-09-22 ENCOUNTER — Other Ambulatory Visit (HOSPITAL_BASED_OUTPATIENT_CLINIC_OR_DEPARTMENT_OTHER): Payer: Self-pay

## 2023-09-23 ENCOUNTER — Other Ambulatory Visit (HOSPITAL_BASED_OUTPATIENT_CLINIC_OR_DEPARTMENT_OTHER): Payer: Self-pay

## 2023-09-23 ENCOUNTER — Other Ambulatory Visit: Payer: Self-pay

## 2023-09-24 ENCOUNTER — Other Ambulatory Visit (HOSPITAL_BASED_OUTPATIENT_CLINIC_OR_DEPARTMENT_OTHER): Payer: Self-pay

## 2023-09-24 ENCOUNTER — Other Ambulatory Visit: Payer: Self-pay

## 2023-09-24 MED ORDER — VARENICLINE TARTRATE 1 MG PO TABS
1.0000 mg | ORAL_TABLET | Freq: Two times a day (BID) | ORAL | 1 refills | Status: DC
  Filled 2023-09-24: qty 60, 30d supply, fill #0
  Filled 2023-10-21: qty 60, 30d supply, fill #1

## 2023-09-24 NOTE — Addendum Note (Signed)
 Addended by: DARYL SETTER on: 09/24/2023 09:00 AM   Modules accepted: Orders

## 2023-09-30 ENCOUNTER — Other Ambulatory Visit (HOSPITAL_BASED_OUTPATIENT_CLINIC_OR_DEPARTMENT_OTHER): Payer: Self-pay

## 2023-10-05 ENCOUNTER — Other Ambulatory Visit: Payer: Self-pay

## 2023-10-09 ENCOUNTER — Other Ambulatory Visit (HOSPITAL_BASED_OUTPATIENT_CLINIC_OR_DEPARTMENT_OTHER): Payer: Self-pay

## 2023-10-21 ENCOUNTER — Other Ambulatory Visit (HOSPITAL_BASED_OUTPATIENT_CLINIC_OR_DEPARTMENT_OTHER): Payer: Self-pay

## 2023-10-28 ENCOUNTER — Other Ambulatory Visit (HOSPITAL_BASED_OUTPATIENT_CLINIC_OR_DEPARTMENT_OTHER): Payer: Self-pay

## 2023-10-29 ENCOUNTER — Other Ambulatory Visit (HOSPITAL_BASED_OUTPATIENT_CLINIC_OR_DEPARTMENT_OTHER): Payer: Self-pay

## 2023-10-30 ENCOUNTER — Other Ambulatory Visit (HOSPITAL_BASED_OUTPATIENT_CLINIC_OR_DEPARTMENT_OTHER): Payer: Self-pay

## 2023-10-31 ENCOUNTER — Other Ambulatory Visit (HOSPITAL_BASED_OUTPATIENT_CLINIC_OR_DEPARTMENT_OTHER): Payer: Self-pay

## 2023-11-01 ENCOUNTER — Other Ambulatory Visit (HOSPITAL_BASED_OUTPATIENT_CLINIC_OR_DEPARTMENT_OTHER): Payer: Self-pay

## 2023-11-21 ENCOUNTER — Other Ambulatory Visit: Payer: Self-pay

## 2023-11-21 ENCOUNTER — Other Ambulatory Visit: Payer: Self-pay | Admitting: Family

## 2023-11-21 ENCOUNTER — Other Ambulatory Visit (HOSPITAL_BASED_OUTPATIENT_CLINIC_OR_DEPARTMENT_OTHER): Payer: Self-pay

## 2023-11-21 DIAGNOSIS — E785 Hyperlipidemia, unspecified: Secondary | ICD-10-CM

## 2023-11-21 MED ORDER — ATORVASTATIN CALCIUM 20 MG PO TABS
20.0000 mg | ORAL_TABLET | Freq: Every day | ORAL | 0 refills | Status: DC
  Filled 2023-11-21: qty 90, 90d supply, fill #0

## 2023-11-21 MED ORDER — FLUZONE HIGH-DOSE 0.5 ML IM SUSY
0.5000 mL | PREFILLED_SYRINGE | Freq: Once | INTRAMUSCULAR | 0 refills | Status: AC
  Filled 2023-11-21: qty 0.5, 1d supply, fill #0

## 2023-11-27 ENCOUNTER — Other Ambulatory Visit: Payer: Self-pay | Admitting: Family

## 2023-11-27 DIAGNOSIS — Z1231 Encounter for screening mammogram for malignant neoplasm of breast: Secondary | ICD-10-CM

## 2023-12-23 ENCOUNTER — Other Ambulatory Visit: Payer: Self-pay

## 2023-12-23 ENCOUNTER — Other Ambulatory Visit (HOSPITAL_BASED_OUTPATIENT_CLINIC_OR_DEPARTMENT_OTHER): Payer: Self-pay

## 2023-12-23 ENCOUNTER — Other Ambulatory Visit: Payer: Self-pay | Admitting: Family

## 2023-12-23 DIAGNOSIS — I10 Essential (primary) hypertension: Secondary | ICD-10-CM

## 2023-12-23 MED ORDER — CARVEDILOL 6.25 MG PO TABS
6.2500 mg | ORAL_TABLET | Freq: Two times a day (BID) | ORAL | 3 refills | Status: AC
  Filled 2023-12-23: qty 60, 30d supply, fill #0
  Filled 2024-01-23: qty 60, 30d supply, fill #1
  Filled 2024-02-24: qty 60, 30d supply, fill #2

## 2023-12-27 ENCOUNTER — Ambulatory Visit: Admitting: Family

## 2023-12-31 ENCOUNTER — Ambulatory Visit: Admitting: Family

## 2023-12-31 ENCOUNTER — Other Ambulatory Visit: Payer: Self-pay

## 2024-01-02 NOTE — Progress Notes (Signed)
 Pt cancelled appointment.

## 2024-01-03 ENCOUNTER — Ambulatory Visit
Admission: RE | Admit: 2024-01-03 | Discharge: 2024-01-03 | Disposition: A | Discharge status: Home / Self Care | Source: Ambulatory Visit | Attending: Family | Admitting: Family

## 2024-01-03 DIAGNOSIS — Z1231 Encounter for screening mammogram for malignant neoplasm of breast: Secondary | ICD-10-CM

## 2024-01-06 ENCOUNTER — Other Ambulatory Visit: Payer: Self-pay | Admitting: Family

## 2024-01-06 ENCOUNTER — Other Ambulatory Visit (HOSPITAL_BASED_OUTPATIENT_CLINIC_OR_DEPARTMENT_OTHER): Payer: Self-pay

## 2024-01-06 DIAGNOSIS — I10 Essential (primary) hypertension: Secondary | ICD-10-CM

## 2024-01-06 MED ORDER — AMLODIPINE BESYLATE 10 MG PO TABS
10.0000 mg | ORAL_TABLET | Freq: Every day | ORAL | 0 refills | Status: AC
  Filled 2024-01-06: qty 90, 90d supply, fill #0

## 2024-01-07 ENCOUNTER — Other Ambulatory Visit (HOSPITAL_BASED_OUTPATIENT_CLINIC_OR_DEPARTMENT_OTHER): Payer: Self-pay

## 2024-01-07 ENCOUNTER — Ambulatory Visit: Admitting: Family

## 2024-01-07 ENCOUNTER — Encounter: Payer: Self-pay | Admitting: Family

## 2024-01-07 ENCOUNTER — Telehealth: Payer: Self-pay | Admitting: Family

## 2024-01-07 VITALS — BP 129/68 | HR 79 | Temp 98.2°F | Resp 16 | Ht 64.0 in | Wt 158.0 lb

## 2024-01-07 DIAGNOSIS — Z720 Tobacco use: Secondary | ICD-10-CM | POA: Diagnosis not present

## 2024-01-07 DIAGNOSIS — E1122 Type 2 diabetes mellitus with diabetic chronic kidney disease: Secondary | ICD-10-CM | POA: Diagnosis not present

## 2024-01-07 DIAGNOSIS — L853 Xerosis cutis: Secondary | ICD-10-CM

## 2024-01-07 DIAGNOSIS — E782 Mixed hyperlipidemia: Secondary | ICD-10-CM | POA: Diagnosis not present

## 2024-01-07 DIAGNOSIS — E559 Vitamin D deficiency, unspecified: Secondary | ICD-10-CM | POA: Diagnosis not present

## 2024-01-07 DIAGNOSIS — I10 Essential (primary) hypertension: Secondary | ICD-10-CM

## 2024-01-07 DIAGNOSIS — N183 Chronic kidney disease, stage 3 unspecified: Secondary | ICD-10-CM

## 2024-01-07 DIAGNOSIS — E119 Type 2 diabetes mellitus without complications: Secondary | ICD-10-CM

## 2024-01-07 DIAGNOSIS — F319 Bipolar disorder, unspecified: Secondary | ICD-10-CM

## 2024-01-07 LAB — BASIC METABOLIC PANEL WITH GFR
BUN: 19 mg/dL (ref 6–23)
CO2: 31 meq/L (ref 19–32)
Calcium: 10.4 mg/dL (ref 8.4–10.5)
Chloride: 100 meq/L (ref 96–112)
Creatinine, Ser: 1.36 mg/dL — ABNORMAL HIGH (ref 0.40–1.20)
GFR: 40.83 mL/min — ABNORMAL LOW (ref >60.00)
Glucose, Bld: 125 mg/dL — ABNORMAL HIGH (ref 70–99)
Potassium: 3.2 meq/L — ABNORMAL LOW (ref 3.5–5.1)
Sodium: 139 meq/L (ref 135–145)

## 2024-01-07 LAB — HEMOGLOBIN A1C: Hgb A1c MFr Bld: 5.7 % (ref 4.6–6.5)

## 2024-01-07 NOTE — Assessment & Plan Note (Signed)
 Lab Results  Component Value Date   CHOL 128 08/27/2023   HDL 35 (L) 08/27/2023   LDLCALC 70 08/27/2023   TRIG 144 08/27/2023   CHOLHDL 3.7 08/27/2023   At goal on lipitor.

## 2024-01-07 NOTE — Assessment & Plan Note (Signed)
 Clinically stable on regimen- management per psychiatry.

## 2024-01-07 NOTE — Assessment & Plan Note (Signed)
 Update A1C today. Has been very well controlled.

## 2024-01-07 NOTE — Progress Notes (Signed)
 Subjective:     Patient ID: Kaitlyn Harper, female    DOB: January 27, 1961, 63 y.o.   MRN: [id number]  Chief Complaint  Patient presents with   Diabetes    Here for follow up   Hypertension    Here for follow  up    Diabetes  Hypertension    Discussed the use of AI scribe software for clinical note transcription with the patient, who gave verbal consent to proceed.  History of Present Illness Kaitlyn Harper is a 62 year old female who presents with concerns about facial discoloration and for a follow-up on her medications.  She has facial skin discoloration characterized by 'spots' and uneven coloration without associated itchiness. These changes have been present for a while, and she chose to discuss them during this visit.  She is currently not taking vitamin D  due to elevated calcium  levels as advised by her nephrologist. She has successfully quit smoking after completing a course of Chantix , with her last cigarette over two months ago. Chantix  was effective in helping her quit by calming her.  Her cholesterol levels were last checked in July. She continues to take Lipitor. She is not currently taking potassium supplements, and the reason for their discontinuation is unclear. Her A1c was last checked over the summer and was 5.9.  She continues to work with psychiatry and reports that her mood is stable with no issues. She is taking amlodipine , carvedilol , and valsartan  for blood pressure, and she notes it can run low at night.  She works and has plans to work on Thanksgiving, which will allow her to avoid cooking. She plans to enjoy dinner and watch football after work.  Lab Results  Component Value Date   HGBA1C 5.9 08/27/2023        Health Maintenance Due  Topic Date Due   Medicare Annual Wellness (AWV)  Never done   OPHTHALMOLOGY EXAM  05/10/2023   COVID-19 Vaccine (6 - 2025-26 season) 10/14/2023    Past Medical History:  Diagnosis Date    Anemia    resolved    Anxiety    Bipolar disorder (HCC)    Chronic kidney disease stage 3    Depression    Diabetes mellitus without complication (HCC)    type 2-diet controlled   Diverticulitis    History of diverticulitis 12/17/2018   Hyperglycemia    Hyperlipidemia    Hypertension    NSTEMI (non-ST elevated myocardial infarction) (HCC) 2018   reports she fainted at his sons house and they took her to ED  , troponin was elevated and cardiac cath was completed,  no stent placed nor hx of stent intervention , she reports no casue was ever determined for the sycope episode    Syncope 03/18/2016   reports no recurrence     Past Surgical History:  Procedure Laterality Date   APPENDECTOMY  1985   BREAST BIOPSY Right 01/13/2014   benign   CHOLECYSTECTOMY  1985   COLONOSCOPY W/ POLYPECTOMY  03/01/2015   Dr Rolan Cedar, [facility].  +adenomatous polyps   COLONOSCOPY W/ POLYPECTOMY  02/28/2011   4 polyps - 3 TA   DILATION AND CURETTAGE OF UTERUS  2005   HEMORRHOID SURGERY  2002   KNEE SURGERY  1983   arthroscopy?   LEFT HEART CATH AND CORONARY ANGIOGRAPHY N/A 03/19/2016   Procedure: Left Heart Cath and Coronary Angiography;  Surgeon: Peter M Jordan, MD;  Location: [facility] INVASIVE CV LAB;  Service: Cardiovascular;  Laterality: N/A;   MOUTH SURGERY  03/08/2016   POLYPECTOMY  2006   ?anal polyp?   TEAR DUCT PROBING  2004   TUBAL LIGATION  1985    Family History  Problem Relation Age of Onset   Hypertension Father    Stroke Maternal Grandmother    Sarcoidosis Brother    Alcohol abuse Brother    Cirrhosis Brother    Colon cancer Neg Hx    Colon polyps Neg Hx    Esophageal cancer Neg Hx    Rectal cancer Neg Hx    Stomach cancer Neg Hx    Pulmonary embolism Neg Hx    Breast cancer Neg Hx     Social History   Socioeconomic History   Marital status: Single    Spouse name: Not on file   Number of children: 3   Years of education: Not on file   Highest education level: Some  college, no degree  Occupational History   Occupation: Armed Forces Operational Officer: [employer]  Tobacco Use   Smoking status: Former    Types: Cigarettes    Start date: 10/14/2023    Quit date: 07/02/2015    Years since quitting: 8.5   Smokeless tobacco: Never   Tobacco comments:    10 cigarettes daily; 10-30 reports  one to two cigarettes every now and again     10/31/22 Patient states that she smokes about a pack of cigarettes a week.   Vaping Use   Vaping status: Never Used  Substance and Sexual Activity   Alcohol use: Yes    Alcohol/week: 0.0 standard drinks of alcohol    Comment: socially   Drug use: No   Sexual activity: Yes    Partners: Male    Birth control/protection: Post-menopausal  Other Topics Concern   Not on file  Social History Narrative   Caffeine use:  2 drinks   Regular exercise: no   Lives with her daughter   3 children    Works as a geographical information systems officer at [employer]: Low Risk  (01/06/2024)   Overall Financial Resource Strain (CARDIA)    Difficulty of Paying Living Expenses: Not hard at all  Food Insecurity: No Food Insecurity (01/06/2024)   Hunger Vital Sign    Worried About Running Out of Food in the Last Year: Never true    Ran Out of Food in the Last Year: Never true  Transportation Needs: Unmet Transportation Needs (01/06/2024)   PRAPARE - Transportation    Lack of Transportation (Medical): No    Lack of Transportation (Non-Medical): Yes  Physical Activity: Inactive (01/06/2024)   Exercise Vital Sign    Days of Exercise per Week: 3 days    Minutes of Exercise per Session: 0 min  Stress: No Stress Concern Present (01/06/2024)   [employer] of Occupational Health - Occupational Stress Questionnaire    Feeling of Stress: Not at all  Social Connections: Moderately Integrated (01/06/2024)   Social Connection and Isolation Panel    Frequency of Communication with Friends and Family: More  than three times a week    Frequency of Social Gatherings with Friends and Family: More than three times a week    Attends Religious Services: 1 to 4 times per year    Active Member of [employer] or Organizations: Yes    Attends Banker Meetings: 1 to 4 times per year    Marital  Status: Divorced  Catering Manager Violence: Not on file    Outpatient Medications Prior to Visit  Medication Sig Dispense Refill   amLODipine  (NORVASC ) 10 MG tablet Take 1 tablet (10 mg total) by mouth daily. 90 tablet 0   ARIPiprazole  (ABILIFY ) 10 MG tablet Take 1 tablet (10 mg total) by mouth daily. 90 tablet 1   aspirin  EC 81 MG tablet Take 81 mg by mouth daily.     atorvastatin  (LIPITOR) 20 MG tablet Take 1 tablet (20 mg total) by mouth daily. 90 tablet 0   carvedilol  (COREG ) 6.25 MG tablet Take 1 tablet (6.25 mg total) by mouth 2 (two) times daily with a meal. 60 tablet 3   hydrochlorothiazide  (HYDRODIURIL ) 25 MG tablet Take 1 tablet (25 mg total) by mouth daily. 90 tablet 1   lamoTRIgine  (LAMICTAL ) 100 MG tablet Take 1 tablet (100 mg total) by mouth in the morning and at bedtime. 180 tablet 1   psyllium (METAMUCIL) 58.6 % powder Take 1 packet by mouth 3 (three) times daily.     traZODone  (DESYREL ) 100 MG tablet Take 1-2 tablets (100-200 mg total) by mouth at bedtime as needed for sleep. 60 tablet 5   ARIPiprazole  (ABILIFY ) 10 MG tablet Take 1 tablet (10 mg total) by mouth daily. 90 tablet 1   cephALEXin  (KEFLEX ) 500 MG capsule Take 1 capsule (500 mg total) by mouth 2 (two) times daily. 10 capsule 0   lamoTRIgine  (LAMICTAL ) 100 MG tablet Take 1 tablet (100 mg total) by mouth every morning AND 1 tablet (100 mg total) at bedtime. 180 tablet 1   potassium chloride  (KLOR-CON  M) 10 MEQ tablet Take 1 tablet (10 mEq total) by mouth 2 (two) times daily. 180 tablet 1   traZODone  (DESYREL ) 100 MG tablet Take 1-2 tablets (100-200 mg total) by mouth at bedtime as needed for sleep. 60 tablet 2   traZODone  (DESYREL )  100 MG tablet Take 1-2 tablets (100-200 mg total) by mouth at bedtime as needed for sleep 60 tablet 5   valsartan  (DIOVAN ) 40 MG tablet Take 1 tablet (40 mg total) by mouth daily. 30 tablet 2   varenicline  (CHANTIX  CONTINUING MONTH PAK) 1 MG tablet Take 1 tablet (1 mg total) by mouth 2 (two) times daily. 60 tablet 1   No facility-administered medications prior to visit.    Allergies  Allergen Reactions   Sulfonamide Derivatives Hives and Other (See Comments)    Light sensitivity    ROS See HPI    Objective:    Physical Exam Constitutional:      General: She is not in acute distress.    Appearance: Normal appearance. She is well-developed.  HENT:     Head: Normocephalic and atraumatic.     Right Ear: External ear normal.     Left Ear: External ear normal.  Eyes:     General: No scleral icterus. Neck:     Thyroid : No thyromegaly.  Cardiovascular:     Rate and Rhythm: Normal rate and regular rhythm.     Heart sounds: Normal heart sounds. No murmur heard. Pulmonary:     Effort: Pulmonary effort is normal. No respiratory distress.     Breath sounds: Normal breath sounds. No wheezing.  Musculoskeletal:     Cervical back: Neck supple.  Skin:    General: Skin is warm and dry.     Comments: Slightly uneven pigmentation on cheeks/beneath bottom lip, dry skin noted  Neurological:     Mental Status: She is alert and  oriented to person, place, and time.  Psychiatric:        Mood and Affect: Mood normal.        Behavior: Behavior normal.        Thought Content: Thought content normal.        Judgment: Judgment normal.      BP 129/68 (BP Location: Right Arm, Patient Position: Sitting, Cuff Size: Normal)   Pulse 79   Temp 98.2 F (36.8 C) (Oral)   Resp 16   Ht 5' 4 (1.626 m)   Wt 158 lb (71.7 kg)   LMP 12/25/2008   SpO2 100%   BMI 27.12 kg/m  Wt Readings from Last 3 Encounters:  01/07/24 158 lb (71.7 kg)  08/27/23 159 lb (72.1 kg)  04/17/23 169 lb (76.7 kg)        Assessment & Plan:   Problem List Items Addressed This Visit       Unprioritized   Hyperlipidemia (Chronic)   Lab Results  Component Value Date   CHOL 128 08/27/2023   HDL 35 (L) 08/27/2023   LDLCALC 70 08/27/2023   TRIG 144 08/27/2023   CHOLHDL 3.7 08/27/2023   At goal on lipitor.       Vitamin D  deficiency - Primary   Remains off of vit D per nephrology due to rising calcium .      Tobacco abuse   Patient quit smoking!!!!  - Continue to abstain from smoking.      Essential hypertension   Bp stable on amlodipine , hydrochlorothiazide  and hydrochlorothiazide        Dry skin    Chronic facial skin discoloration and dryness likely due to pigmentary unevenness. Cosmetic concern. - Recommended Cetaphil or Eucerin for sensitive skin. - Advised moisturizing twice daily. - Offered dermatology referral if desired.      Controlled type 2 diabetes mellitus with stage 3 chronic kidney disease, without long-term current use of insulin (HCC)   Update A1C today. Has been very well controlled.       Relevant Orders   Basic Metabolic Panel (BMET)   HgB A1c   Bipolar affective disorder, remission status unspecified (HCC)   Clinically stable on regimen- management per psychiatry.      Assessment & Plan     I have discontinued Jaisha A. Corales Harper's potassium chloride , valsartan , cephALEXin , and varenicline . I am also having her maintain her aspirin  EC, psyllium, hydrochlorothiazide , ARIPiprazole , lamoTRIgine , traZODone , atorvastatin , carvedilol , and amLODipine .  No orders of the defined types were placed in this encounter.

## 2024-01-07 NOTE — Patient Instructions (Signed)
  VISIT SUMMARY: Today, we discussed your concerns about facial discoloration and reviewed your current medications. We also checked your potassium and A1c levels, and discussed your blood pressure, cholesterol, and smoking cessation progress.  YOUR PLAN: -FACIAL SKIN DISCOLORATION AND DRYNESS: Your facial skin discoloration and dryness are likely due to uneven pigmentation. We recommend using Cetaphil or Eucerin for sensitive skin and moisturizing twice daily. If you would like, we can refer you to a dermatologist for further evaluation.  -TYPE 2 DIABETES MELLITUS WITH DIABETIC CHRONIC KIDNEY DISEASE: Your type 2 diabetes is well-controlled with a recent A1c of 5.9. We have discontinued vitamin D  due to elevated calcium  levels. Today, we checked your potassium and updated your A1c levels.  -ESSENTIAL HYPERTENSION: Your high blood pressure is well-controlled with your current medications. You mentioned occasional low readings at night, but without dizziness. Please continue taking amlodipine , carvedilol , and valsartan , and let us  know if you experience dizziness or very low blood pressure.  -MIXED HYPERLIPIDEMIA: Your cholesterol levels are well-managed with Lipitor. Please continue taking Lipitor as prescribed.  -TOBACCO USE, IN REMISSION: You have successfully quit smoking with the help of Chantix . Please continue to abstain from smoking.  INSTRUCTIONS: We have checked your potassium and A1c levels today. Please follow up with us  if you experience any dizziness or very low blood pressure. If you would like a referral to a dermatologist for your facial discoloration, let us  know.

## 2024-01-07 NOTE — Assessment & Plan Note (Addendum)
 Patient quit smoking!!!!  - Continue to abstain from smoking.

## 2024-01-07 NOTE — Telephone Encounter (Signed)
 Electronic request sent

## 2024-01-07 NOTE — Assessment & Plan Note (Signed)
 Bp stable on amlodipine , hydrochlorothiazide  and hydrochlorothiazide 

## 2024-01-07 NOTE — Assessment & Plan Note (Signed)
 Remains off of vit D per nephrology due to rising calcium .

## 2024-01-07 NOTE — Assessment & Plan Note (Signed)
  Chronic facial skin discoloration and dryness likely due to pigmentary unevenness. Cosmetic concern. - Recommended Cetaphil or Eucerin for sensitive skin. - Advised moisturizing twice daily. - Offered dermatology referral if desired.

## 2024-01-07 NOTE — Telephone Encounter (Signed)
 Please call Brinda Hull to request DM eye exam.

## 2024-01-08 ENCOUNTER — Ambulatory Visit: Payer: Self-pay | Admitting: Family

## 2024-01-08 DIAGNOSIS — E876 Hypokalemia: Secondary | ICD-10-CM

## 2024-01-08 MED ORDER — POTASSIUM CHLORIDE CRYS ER 10 MEQ PO TBCR: 10.0000 meq | EXTENDED_RELEASE_TABLET | Freq: Two times a day (BID) | ORAL | Status: AC

## 2024-01-08 NOTE — Telephone Encounter (Signed)
 Potassium is low. Please restart kdur 10 mEQ bid and repeat bmet in 1 week.

## 2024-01-11 ENCOUNTER — Ambulatory Visit: Payer: Self-pay | Admitting: Family

## 2024-01-13 ENCOUNTER — Other Ambulatory Visit (INDEPENDENT_AMBULATORY_CARE_PROVIDER_SITE_OTHER)

## 2024-01-13 ENCOUNTER — Ambulatory Visit: Admitting: Family

## 2024-01-13 DIAGNOSIS — E876 Hypokalemia: Secondary | ICD-10-CM

## 2024-01-14 ENCOUNTER — Ambulatory Visit: Payer: Self-pay | Admitting: Family

## 2024-01-14 LAB — BASIC METABOLIC PANEL WITH GFR
BUN: 18 mg/dL (ref 6–23)
CO2: 27 meq/L (ref 19–32)
Calcium: 9.9 mg/dL (ref 8.4–10.5)
Chloride: 104 meq/L (ref 96–112)
Creatinine, Ser: 1.56 mg/dL — ABNORMAL HIGH (ref 0.40–1.20)
GFR: 34.63 mL/min — ABNORMAL LOW (ref >60.00)
Glucose, Bld: 109 mg/dL — ABNORMAL HIGH (ref 70–99)
Potassium: 3.9 meq/L (ref 3.5–5.1)
Sodium: 141 meq/L (ref 135–145)

## 2024-02-03 ENCOUNTER — Other Ambulatory Visit (HOSPITAL_BASED_OUTPATIENT_CLINIC_OR_DEPARTMENT_OTHER): Payer: Self-pay

## 2024-02-04 ENCOUNTER — Other Ambulatory Visit (HOSPITAL_BASED_OUTPATIENT_CLINIC_OR_DEPARTMENT_OTHER): Payer: Self-pay

## 2024-02-04 ENCOUNTER — Other Ambulatory Visit: Payer: Self-pay

## 2024-02-04 MED ORDER — VALSARTAN 40 MG PO TABS
40.0000 mg | ORAL_TABLET | Freq: Every day | ORAL | 3 refills | Status: AC
  Filled 2024-02-04: qty 90, 90d supply, fill #0

## 2024-02-07 ENCOUNTER — Other Ambulatory Visit (HOSPITAL_BASED_OUTPATIENT_CLINIC_OR_DEPARTMENT_OTHER): Payer: Self-pay

## 2024-02-10 ENCOUNTER — Other Ambulatory Visit (HOSPITAL_BASED_OUTPATIENT_CLINIC_OR_DEPARTMENT_OTHER): Payer: Self-pay

## 2024-02-19 ENCOUNTER — Other Ambulatory Visit (HOSPITAL_BASED_OUTPATIENT_CLINIC_OR_DEPARTMENT_OTHER): Payer: Self-pay

## 2024-02-19 ENCOUNTER — Other Ambulatory Visit: Payer: Self-pay | Admitting: Family

## 2024-02-19 ENCOUNTER — Other Ambulatory Visit: Payer: Self-pay

## 2024-02-19 DIAGNOSIS — E785 Hyperlipidemia, unspecified: Secondary | ICD-10-CM

## 2024-02-19 MED ORDER — ATORVASTATIN CALCIUM 20 MG PO TABS
20.0000 mg | ORAL_TABLET | Freq: Every day | ORAL | 0 refills | Status: AC
  Filled 2024-02-19: qty 90, 90d supply, fill #0

## 2024-07-07 ENCOUNTER — Ambulatory Visit: Admitting: Family
